# Patient Record
Sex: Female | Born: 1965 | Race: Black or African American | Hispanic: No | Marital: Single | Smoking: Never smoker
Health system: Southern US, Community
[De-identification: ages and names within clinical notes are randomized; demographics above are authoritative.]

## PROBLEM LIST (undated history)

## (undated) DIAGNOSIS — N200 Calculus of kidney: Secondary | ICD-10-CM

## (undated) DIAGNOSIS — K5900 Constipation, unspecified: Secondary | ICD-10-CM

## (undated) DIAGNOSIS — Z9889 Other specified postprocedural states: Secondary | ICD-10-CM

## (undated) DIAGNOSIS — F419 Anxiety disorder, unspecified: Secondary | ICD-10-CM

## (undated) DIAGNOSIS — K219 Gastro-esophageal reflux disease without esophagitis: Secondary | ICD-10-CM

## (undated) DIAGNOSIS — C801 Malignant (primary) neoplasm, unspecified: Secondary | ICD-10-CM

## (undated) DIAGNOSIS — Z87442 Personal history of urinary calculi: Secondary | ICD-10-CM

## (undated) DIAGNOSIS — R112 Nausea with vomiting, unspecified: Secondary | ICD-10-CM

## (undated) DIAGNOSIS — Z5189 Encounter for other specified aftercare: Secondary | ICD-10-CM

## (undated) DIAGNOSIS — S82832A Other fracture of upper and lower end of left fibula, initial encounter for closed fracture: Secondary | ICD-10-CM

## (undated) HISTORY — PX: COLON SURGERY: SHX602

## (undated) HISTORY — DX: Calculus of kidney: N20.0

## (undated) HISTORY — PX: OTHER SURGICAL HISTORY: SHX169

## (undated) HISTORY — DX: Other fracture of upper and lower end of left fibula, initial encounter for closed fracture: S82.832A

---

## 1993-08-15 DIAGNOSIS — IMO0001 Reserved for inherently not codable concepts without codable children: Secondary | ICD-10-CM

## 1993-08-15 DIAGNOSIS — Z5189 Encounter for other specified aftercare: Secondary | ICD-10-CM

## 1993-08-15 HISTORY — DX: Reserved for inherently not codable concepts without codable children: IMO0001

## 1993-08-15 HISTORY — PX: ABDOMINAL HYSTERECTOMY: SHX81

## 1993-08-15 HISTORY — DX: Encounter for other specified aftercare: Z51.89

## 1999-09-28 ENCOUNTER — Other Ambulatory Visit: Admission: RE | Admit: 1999-09-28 | Discharge: 1999-09-28 | Payer: Self-pay

## 2000-02-09 ENCOUNTER — Encounter: Admission: RE | Admit: 2000-02-09 | Discharge: 2000-05-09 | Payer: Self-pay | Admitting: Orthopedic Surgery

## 2000-11-04 ENCOUNTER — Ambulatory Visit (HOSPITAL_COMMUNITY): Admission: RE | Admit: 2000-11-04 | Discharge: 2000-11-04 | Payer: Self-pay | Admitting: Family Medicine

## 2001-10-16 ENCOUNTER — Other Ambulatory Visit: Admission: RE | Admit: 2001-10-16 | Discharge: 2001-10-16 | Payer: Self-pay | Admitting: Family Medicine

## 2002-10-27 ENCOUNTER — Emergency Department (HOSPITAL_COMMUNITY): Admission: EM | Admit: 2002-10-27 | Discharge: 2002-10-27 | Payer: Self-pay | Admitting: Emergency Medicine

## 2003-02-11 ENCOUNTER — Encounter: Admission: RE | Admit: 2003-02-11 | Discharge: 2003-04-09 | Payer: Self-pay | Admitting: Podiatry

## 2004-04-30 ENCOUNTER — Other Ambulatory Visit: Admission: RE | Admit: 2004-04-30 | Discharge: 2004-04-30 | Payer: Self-pay | Admitting: Family Medicine

## 2005-06-14 ENCOUNTER — Other Ambulatory Visit: Admission: RE | Admit: 2005-06-14 | Discharge: 2005-06-14 | Payer: Self-pay | Admitting: Family Medicine

## 2006-07-28 ENCOUNTER — Other Ambulatory Visit: Admission: RE | Admit: 2006-07-28 | Discharge: 2006-07-28 | Payer: Self-pay | Admitting: Family Medicine

## 2007-05-22 ENCOUNTER — Emergency Department (HOSPITAL_COMMUNITY): Admission: EM | Admit: 2007-05-22 | Discharge: 2007-05-22 | Payer: Self-pay | Admitting: Emergency Medicine

## 2007-11-22 ENCOUNTER — Ambulatory Visit (HOSPITAL_COMMUNITY): Admission: RE | Admit: 2007-11-22 | Discharge: 2007-11-22 | Payer: Self-pay | Admitting: Obstetrics and Gynecology

## 2007-12-12 ENCOUNTER — Ambulatory Visit (HOSPITAL_COMMUNITY): Admission: RE | Admit: 2007-12-12 | Discharge: 2007-12-12 | Payer: Self-pay | Admitting: Obstetrics & Gynecology

## 2007-12-12 ENCOUNTER — Encounter: Payer: Self-pay | Admitting: Obstetrics & Gynecology

## 2010-12-12 ENCOUNTER — Other Ambulatory Visit: Payer: Self-pay | Admitting: Orthopedic Surgery

## 2010-12-12 DIAGNOSIS — M5126 Other intervertebral disc displacement, lumbar region: Secondary | ICD-10-CM

## 2010-12-15 ENCOUNTER — Ambulatory Visit
Admission: RE | Admit: 2010-12-15 | Discharge: 2010-12-15 | Disposition: A | Discharge status: Home / Self Care | Payer: BC Managed Care – PPO | Source: Ambulatory Visit | Attending: Orthopedic Surgery | Admitting: Orthopedic Surgery

## 2010-12-15 DIAGNOSIS — M5126 Other intervertebral disc displacement, lumbar region: Secondary | ICD-10-CM

## 2010-12-28 NOTE — Op Note (Signed)
NAME:  [name], [name]               ACCOUNT NO.:  [id number]   MEDICAL RECORD NO.:  [id number]          PATIENT TYPE:  AMB   LOCATION:  DAY                           FACILITY:  [facility]   PHYSICIAN:  Lazaro Arms, M.D.   DATE OF BIRTH:  [birth year]   DATE OF PROCEDURE:  DATE OF DISCHARGE:                               OPERATIVE REPORT   PREOPERATIVE DIAGNOSES:  1. Left lower quadrant pain.  2. Left hydrosalpinx.   POSTOPERATIVE DIAGNOSES:  1. Left lower quadrant pain.  2. Left hydrosalpinx.   PROCEDURE:  Laparoscopic left salpingo-oophorectomy.   SURGEON:  Lazaro Arms, MD   ANESTHESIA:  General endotracheal.   FINDINGS:  The patient had really no adhesive disease, whatsoever.  She  did have a rather impressive left hydrosalpinx that was actually  permeating into the peritoneum as well.  The right ovary and tube were  normal.  She had 1 flimsy adhesion over her ascending colon that was  taken down.  Liver was normal.  There were no other adhesions.  No other  problems in the peritoneal cavity.   DESCRIPTION OF OPERATION:  The patient was taken to the operating room,  placed in supine position where she underwent general endotracheal  anesthesia.  She was placed in the lithotomy position.  Foley catheter  was placed.  She was prepped and draped in the usual sterile fashion.  Incision was made in the umbilicus.  Veress needle was used,  placed in  the peritoneal cavity with one pass without difficulty.  Drop test  confirmed and the peritoneal cavity was insufflated using CO2.  A  nonbladed 11-mm trocar was then employed with a camera and placed in the  peritoneal cavity with one pass without difficulty.  An incision was  made two fingerbreadths above the pubis and also the left lower quadrant  and 5-mm trocars were placed in the peritoneal cavity without  difficulty.  Under direct visualization, the above-noted findings were  seen.  Pictures were taken.  The harmonic scalpel  was employed and the  left tube and ovary were taken down hemostatically without difficulty.  We were well away from the ureter.  Ureter was identified throughout its  course.  The flimsy adhesion on the ascending colon was also taken out  without difficulty.  The right ovary was left in place because it was  completely normal as was the tube.  The left fallopian tube and ovary  were then removed with the EndoCatch through the 11-mm port, 5-mm scope  was used for visualization without difficulty.  The umbilical fascia was  closed with 0 Vicryl suture after that point, but all the gas  was allowed to escape.  The pedicle was hemostatic.  Pelvis was  irrigated.  The skin was then closed using skin staples x3.  The patient  tolerated the procedure well.  She experienced minimal blood loss, taken  to recovery room in good stable condition.  All counts were correct x3.      Lazaro Arms, M.D.  Electronically Signed     LHE/MEDQ  D:  12/12/2007  T:  12/13/2007  Job:  [id number]

## 2011-03-31 ENCOUNTER — Ambulatory Visit (HOSPITAL_COMMUNITY)
Admission: RE | Admit: 2011-03-31 | Discharge: 2011-03-31 | Disposition: A | Discharge status: Home / Self Care | Payer: BC Managed Care – PPO | Source: Ambulatory Visit | Attending: Urology | Admitting: Urology

## 2011-03-31 ENCOUNTER — Other Ambulatory Visit (HOSPITAL_COMMUNITY): Payer: Self-pay | Admitting: Urology

## 2011-03-31 DIAGNOSIS — N201 Calculus of ureter: Secondary | ICD-10-CM

## 2011-03-31 DIAGNOSIS — M545 Low back pain, unspecified: Secondary | ICD-10-CM | POA: Insufficient documentation

## 2011-03-31 DIAGNOSIS — R109 Unspecified abdominal pain: Secondary | ICD-10-CM | POA: Insufficient documentation

## 2011-05-10 LAB — COMPREHENSIVE METABOLIC PANEL
Albumin: 3.8
Alkaline Phosphatase: 48
Calcium: 9.3
Chloride: 102
Creatinine, Ser: 0.72
GFR calc Af Amer: >60
GFR calc non Af Amer: >60
Glucose, Bld: 86
Potassium: 3.9
Sodium: 135
Total Protein: 6.7

## 2011-05-10 LAB — CBC
MCHC: 35
MCV: 90.7
RBC: 4.04

## 2011-05-10 LAB — URINALYSIS, ROUTINE W REFLEX MICROSCOPIC
Bilirubin Urine: NEGATIVE
Nitrite: NEGATIVE
Specific Gravity, Urine: <1.005 — ABNORMAL LOW
Urobilinogen, UA: 0.2
pH: 6

## 2011-05-10 LAB — URINE MICROSCOPIC-ADD ON

## 2011-08-03 ENCOUNTER — Other Ambulatory Visit: Payer: Self-pay | Admitting: Otolaryngology

## 2011-09-02 ENCOUNTER — Encounter (HOSPITAL_COMMUNITY): Payer: Self-pay | Admitting: Pharmacy Technician

## 2011-09-02 ENCOUNTER — Other Ambulatory Visit (HOSPITAL_COMMUNITY): Payer: Managed Care, Other (non HMO)

## 2011-09-12 ENCOUNTER — Encounter (HOSPITAL_COMMUNITY)
Admission: RE | Admit: 2011-09-12 | Discharge: 2011-09-12 | Disposition: A | Discharge status: Home / Self Care | Payer: Managed Care, Other (non HMO) | Source: Ambulatory Visit | Attending: Specialist | Admitting: Specialist

## 2011-09-12 ENCOUNTER — Encounter (HOSPITAL_COMMUNITY): Payer: Self-pay

## 2011-09-12 HISTORY — DX: Other specified postprocedural states: Z98.890

## 2011-09-12 HISTORY — DX: Nausea with vomiting, unspecified: R11.2

## 2011-09-12 HISTORY — DX: Encounter for other specified aftercare: Z51.89

## 2011-09-12 LAB — COMPREHENSIVE METABOLIC PANEL
ALT: 19 U/L (ref 0–35)
Alkaline Phosphatase: 53 U/L (ref 39–117)
BUN: 11 mg/dL (ref 6–23)
CO2: 27 mEq/L (ref 19–32)
Chloride: 105 mEq/L (ref 96–112)
GFR calc Af Amer: >90 mL/min (ref >90)
GFR calc non Af Amer: >90 mL/min (ref >90)
Glucose, Bld: 68 mg/dL — ABNORMAL LOW (ref 70–99)
Potassium: 3.7 mEq/L (ref 3.5–5.1)
Total Bilirubin: 0.2 mg/dL — ABNORMAL LOW (ref 0.3–1.2)

## 2011-09-12 LAB — URINALYSIS, ROUTINE W REFLEX MICROSCOPIC
Bilirubin Urine: NEGATIVE
Ketones, ur: NEGATIVE mg/dL
Leukocytes, UA: NEGATIVE
Nitrite: NEGATIVE
Protein, ur: NEGATIVE mg/dL

## 2011-09-12 LAB — URINE MICROSCOPIC-ADD ON

## 2011-09-12 LAB — CBC
HCT: 40.1 % (ref 36.0–46.0)
Hemoglobin: 13.2 g/dL (ref 12.0–15.0)
MCHC: 32.9 g/dL (ref 30.0–36.0)
RBC: 4.37 MIL/uL (ref 3.87–5.11)
WBC: 5 10*3/uL (ref 4.0–10.5)

## 2011-09-12 NOTE — Patient Instructions (Addendum)
20 [name]  09/12/2011   Your procedure is scheduled on: 09-15-2011  Report to [facility] at 0830 AM.  Call this number if you have problems the morning of surgery: [phone]   Remember:   Do not eat food or drink liquids:After Midnight.      Take these medicines the morning of surgery with A SIP OF WATER:    Do not wear jewelry, make-up.  Do not wear lotions, powders, or perfumes. You may wear deodorant.  Do not shave 48 hours prior to surgery.  Do not bring valuables to the hospital.  Contacts, dentures or bridgework may not be worn into surgery.  Leave suitcase in the car. After surgery it may be brought to your room.  For patients admitted to the hospital, checkout time is 11:00 AM the day of discharge.   Patients discharged the day of surgery will not be allowed to drive home.  Name and phone number of your driver:  Special Instructions: CHG Shower Use Special Wash: 1/2 bottle night before surgery and 1/2 bottle morning of surgery.neck down avoid private area   Please read over the following fact sheets that you were given: MRSA Information Jasmine December Milo Schreier rn [facility] pre op nurse phone number [phone]

## 2011-09-15 ENCOUNTER — Encounter (HOSPITAL_COMMUNITY)
Admission: RE | Disposition: A | Discharge status: Home / Self Care | Payer: Self-pay | Source: Ambulatory Visit | Attending: Specialist

## 2011-09-15 ENCOUNTER — Ambulatory Visit (HOSPITAL_COMMUNITY)
Admission: RE | Admit: 2011-09-15 | Discharge: 2011-09-15 | Disposition: A | Discharge status: Home / Self Care | Payer: Managed Care, Other (non HMO) | Source: Ambulatory Visit | Attending: Specialist | Admitting: Specialist

## 2011-09-15 ENCOUNTER — Encounter (HOSPITAL_COMMUNITY): Payer: Self-pay | Admitting: Anesthesiology

## 2011-09-15 ENCOUNTER — Encounter (HOSPITAL_COMMUNITY): Payer: Self-pay | Admitting: *Deleted

## 2011-09-15 ENCOUNTER — Ambulatory Visit: Admit: 2011-09-15 | Payer: Self-pay | Admitting: Specialist

## 2011-09-15 ENCOUNTER — Ambulatory Visit (HOSPITAL_COMMUNITY): Payer: Managed Care, Other (non HMO) | Admitting: Anesthesiology

## 2011-09-15 DIAGNOSIS — M6688 Spontaneous rupture of other tendons, other: Secondary | ICD-10-CM | POA: Insufficient documentation

## 2011-09-15 DIAGNOSIS — Z01812 Encounter for preprocedural laboratory examination: Secondary | ICD-10-CM | POA: Insufficient documentation

## 2011-09-15 DIAGNOSIS — M76899 Other specified enthesopathies of unspecified lower limb, excluding foot: Secondary | ICD-10-CM | POA: Insufficient documentation

## 2011-09-15 DIAGNOSIS — M7072 Other bursitis of hip, left hip: Secondary | ICD-10-CM

## 2011-09-15 DIAGNOSIS — E669 Obesity, unspecified: Secondary | ICD-10-CM | POA: Insufficient documentation

## 2011-09-15 HISTORY — PX: EXCISION/RELEASE BURSA HIP: SHX5014

## 2011-09-15 SURGERY — RELEASE, BURSA, TROCHANTERIC
Anesthesia: General | Laterality: Left

## 2011-09-15 SURGERY — RELEASE, BURSA, TROCHANTERIC
Anesthesia: General | Site: Hip | Laterality: Left | Wound class: Clean

## 2011-09-15 MED ORDER — DEXAMETHASONE SODIUM PHOSPHATE 10 MG/ML IJ SOLN
INTRAMUSCULAR | Status: DC | PRN
  Administered 2011-09-15: 10 mg via INTRAVENOUS

## 2011-09-15 MED ORDER — LACTATED RINGERS IV SOLN
INTRAVENOUS | Status: DC
  Administered 2011-09-15 (×2): via INTRAVENOUS

## 2011-09-15 MED ORDER — OXYCODONE-ACETAMINOPHEN 5-325 MG PO TABS: 1.0000 | ORAL_TABLET | ORAL | Status: AC | PRN

## 2011-09-15 MED ORDER — CEFAZOLIN SODIUM-DEXTROSE 2-3 GM-% IV SOLR
2.0000 g | INTRAVENOUS | Status: AC
  Administered 2011-09-15: 2 g via INTRAVENOUS

## 2011-09-15 MED ORDER — FENTANYL CITRATE 0.05 MG/ML IJ SOLN
INTRAMUSCULAR | Status: DC | PRN
  Administered 2011-09-15: 100 ug via INTRAVENOUS
  Administered 2011-09-15: 50 ug via INTRAVENOUS

## 2011-09-15 MED ORDER — GLYCOPYRROLATE 0.2 MG/ML IJ SOLN
INTRAMUSCULAR | Status: DC | PRN
  Administered 2011-09-15: .4 mg via INTRAVENOUS

## 2011-09-15 MED ORDER — BUPIVACAINE-EPINEPHRINE PF 0.5-1:200000 % IJ SOLN
INTRAMUSCULAR | Status: DC | PRN
  Administered 2011-09-15: 15 mL

## 2011-09-15 MED ORDER — DROPERIDOL 2.5 MG/ML IJ SOLN
INTRAMUSCULAR | Status: DC | PRN
  Administered 2011-09-15: 0.625 mg via INTRAVENOUS

## 2011-09-15 MED ORDER — ACETAMINOPHEN 10 MG/ML IV SOLN
INTRAVENOUS | Status: DC | PRN
  Administered 2011-09-15: 1000 mg via INTRAVENOUS

## 2011-09-15 MED ORDER — SODIUM CHLORIDE 0.9 % IR SOLN
Status: DC | PRN
  Administered 2011-09-15: 10:00:00

## 2011-09-15 MED ORDER — PROMETHAZINE HCL 25 MG/ML IJ SOLN: 6.2500 mg | INTRAMUSCULAR | Status: DC | PRN

## 2011-09-15 MED ORDER — MIDAZOLAM HCL 5 MG/5ML IJ SOLN
INTRAMUSCULAR | Status: DC | PRN
  Administered 2011-09-15: 2 mg via INTRAVENOUS

## 2011-09-15 MED ORDER — HYDROMORPHONE HCL PF 1 MG/ML IJ SOLN
0.2500 mg | INTRAMUSCULAR | Status: DC | PRN
  Administered 2011-09-15: 0.5 mg via INTRAVENOUS

## 2011-09-15 MED ORDER — PROPOFOL 10 MG/ML IV EMUL
INTRAVENOUS | Status: DC | PRN
  Administered 2011-09-15: 140 mg via INTRAVENOUS

## 2011-09-15 MED ORDER — NEOSTIGMINE METHYLSULFATE 1 MG/ML IJ SOLN
INTRAMUSCULAR | Status: DC | PRN
  Administered 2011-09-15: 3 mg via INTRAVENOUS

## 2011-09-15 MED ORDER — KETOROLAC TROMETHAMINE 30 MG/ML IJ SOLN
15.0000 mg | Freq: Once | INTRAMUSCULAR | Status: AC | PRN
  Administered 2011-09-15: 30 mg via INTRAVENOUS

## 2011-09-15 MED ORDER — ROCURONIUM BROMIDE 100 MG/10ML IV SOLN
INTRAVENOUS | Status: DC | PRN
  Administered 2011-09-15: 35 mg via INTRAVENOUS

## 2011-09-15 MED ORDER — ONDANSETRON HCL 4 MG/2ML IJ SOLN
INTRAMUSCULAR | Status: DC | PRN
  Administered 2011-09-15: 4 mg via INTRAVENOUS

## 2011-09-15 SURGICAL SUPPLY — 43 items
APL SKNCLS STERI-STRIP NONHPOA (GAUZE/BANDAGES/DRESSINGS) ×1
BAG SPEC THK2 15X12 ZIP CLS (MISCELLANEOUS) ×1
BAG ZIPLOCK 12X15 (MISCELLANEOUS) ×2 IMPLANT
BENZOIN TINCTURE PRP APPL 2/3 (GAUZE/BANDAGES/DRESSINGS) ×1 IMPLANT
BLADE SURG 15 STRL LF DISP TIS (BLADE) ×1 IMPLANT
BLADE SURG 15 STRL SS (BLADE) ×2
CHLORAPREP W/TINT 26ML (MISCELLANEOUS) IMPLANT
CLOSURE STERI STRIP 1/2 X4 (GAUZE/BANDAGES/DRESSINGS) ×1 IMPLANT
CLOTH BEACON ORANGE TIMEOUT ST (SAFETY) ×2 IMPLANT
DECANTER SPIKE VIAL GLASS SM (MISCELLANEOUS) ×2 IMPLANT
DRAPE ORTHO SPLIT 77X108 STRL (DRAPES) ×2
DRAPE SURG ORHT 6 SPLT 77X108 (DRAPES) ×1 IMPLANT
DRAPE U-SHAPE 47X51 STRL (DRAPES) ×2 IMPLANT
DRSG ADAPTIC 3X8 NADH LF (GAUZE/BANDAGES/DRESSINGS) ×1 IMPLANT
DRSG MEPILEX BORDER 4X8 (GAUZE/BANDAGES/DRESSINGS) ×1 IMPLANT
ELECT REM PT RETURN 9FT ADLT (ELECTROSURGICAL) ×2
ELECTRODE REM PT RTRN 9FT ADLT (ELECTROSURGICAL) ×1 IMPLANT
GLOVE BIO SURGEON STRL SZ 6.5 (GLOVE) ×2 IMPLANT
GLOVE BIOGEL PI IND STRL 6.5 (GLOVE) ×1 IMPLANT
GLOVE BIOGEL PI IND STRL 8 (GLOVE) ×1 IMPLANT
GLOVE BIOGEL PI INDICATOR 6.5 (GLOVE) ×1
GLOVE BIOGEL PI INDICATOR 8 (GLOVE) ×1
GLOVE ECLIPSE 6.5 STRL STRAW (GLOVE) ×2 IMPLANT
GLOVE SURG SS PI 8.0 STRL IVOR (GLOVE) ×4 IMPLANT
GOWN PREVENTION PLUS LG XLONG (DISPOSABLE) ×2 IMPLANT
GOWN STRL REIN XL XLG (GOWN DISPOSABLE) ×2 IMPLANT
MANIFOLD NEPTUNE II (INSTRUMENTS) ×2 IMPLANT
NEEDLE HYPO 22GX1.5 SAFETY (NEEDLE) ×1 IMPLANT
NS IRRIG 1000ML POUR BTL (IV SOLUTION) ×2 IMPLANT
PACK TOTAL JOINT (CUSTOM PROCEDURE TRAY) ×2 IMPLANT
POSITIONER SURGICAL ARM (MISCELLANEOUS) ×2 IMPLANT
STAPLER VISISTAT (STAPLE) ×2 IMPLANT
STAPLER VISISTAT 35W (STAPLE) ×2 IMPLANT
STRIP CLOSURE SKIN 1/2X4 (GAUZE/BANDAGES/DRESSINGS) ×2 IMPLANT
SUT ETHIBOND NAB CT1 #1 30IN (SUTURE) ×1 IMPLANT
SUT MNCRL AB 4-0 PS2 18 (SUTURE) ×2 IMPLANT
SUT PROLENE 3 0 PS 1 (SUTURE) ×1 IMPLANT
SUT VIC AB 1 CT1 27 (SUTURE) ×6
SUT VIC AB 1 CT1 27XBRD ANTBC (SUTURE) ×3 IMPLANT
SUT VIC AB 2-0 CT1 27 (SUTURE) ×6
SUT VIC AB 2-0 CT1 TAPERPNT 27 (SUTURE) ×3 IMPLANT
SYR CONTROL 10ML LL (SYRINGE) ×1 IMPLANT
WATER STERILE IRR 1500ML POUR (IV SOLUTION) ×2 IMPLANT

## 2011-09-15 NOTE — Anesthesia Postprocedure Evaluation (Signed)
  Anesthesia Post-op Note  Patient: [name]  Procedure(s) Performed:  EXCISION/RELEASE BURSA HIP - Excision of Trochanteric Bursitis, Tendon Release and Repair  Patient Location: PACU  Anesthesia Type: General  Level of Consciousness: awake and alert   Airway and Oxygen Therapy: Patient Spontanous Breathing  Post-op Pain: mild  Post-op Assessment: Post-op Vital signs reviewed, Patient's Cardiovascular Status Stable, Respiratory Function Stable, Patent Airway and No signs of Nausea or vomiting  Post-op Vital Signs: stable  Complications: No apparent anesthesia complications

## 2011-09-15 NOTE — Transfer of Care (Signed)
Immediate Anesthesia Transfer of Care Note  Patient: [name]  Procedure(s) Performed:  EXCISION/RELEASE BURSA HIP - Excision of Trochanteric Bursitis, Tendon Release and Repair  Patient Location: PACU  Anesthesia Type: General  Level of Consciousness: awake, sedated and patient cooperative  Airway & Oxygen Therapy: Patient Spontanous Breathing and Patient connected to face mask oxygen  Post-op Assessment: Report given to PACU RN and Post -op Vital signs reviewed and stable  Post vital signs: Reviewed and stable  Complications: No apparent anesthesia complications

## 2011-09-15 NOTE — Progress Notes (Signed)
Ambulatory to BR out in hallway with crutches tolerated activity fairly well.

## 2011-09-15 NOTE — H&P (Signed)
[name] is an 46 y.o. female.   Chief Complaint: left hip pain HPI: bursitis tendon tear left hip  Past Medical History  Diagnosis Date  . PONV (postoperative nausea and vomiting)   . Blood transfusion 1995    Past Surgical History  Procedure Date  . Abdominal hysterectomy 1995  . Left ankle surgery for fx several yrs ago  . Endoscopic left fallopian tube removed several yrs ago    No family history on file. Social History:  reports that she has never smoked. She has never used smokeless tobacco. She reports that she drinks alcohol. She reports that she does not use illicit drugs.  Allergies:  Allergies  Allergen Reactions  . Codeine Shortness Of Breath    No current facility-administered medications on file as of .   No current outpatient prescriptions on file as of .    No results found for this or any previous visit (from the past 48 hour(s)). No results found.  Review of Systems  Musculoskeletal: Positive for joint pain.       Pain left hip   All other systems reviewed and are negative.    There were no vitals taken for this visit. Physical Exam  Vitals reviewed. Constitutional: She is oriented to person, place, and time. She appears well-developed.  HENT:  Head: Normocephalic.  Eyes: Pupils are equal, round, and reactive to light.  Neck: Normal range of motion.  Cardiovascular: Normal rate.   Respiratory: Effort normal.  GI: Soft.  Musculoskeletal: She exhibits tenderness.       Pain with palpation left trochanter  Neurological: She is alert and oriented to person, place, and time. She has normal reflexes.  Skin: Skin is warm.  Psychiatric: She has a normal mood and affect.     Assessment/Plan Trochanteric bursitis tendon tear left hip. Plan remove bursa repair tendon. Risks disxussed.  [name] C 09/15/2011, 7:23 AM

## 2011-09-15 NOTE — Brief Op Note (Signed)
09/15/2011  11:11 AM  PATIENT:  [name]  46 y.o. female  PRE-OPERATIVE DIAGNOSIS:  left hip bursa  POST-OPERATIVE DIAGNOSIS:  left hip bursa  PROCEDURE:  Procedure(s): EXCISION/RELEASE BURSA HIP  SURGEON:  Surgeon(s): Javier Docker, MD  PHYSICIAN ASSISTANT:   ASSISTANTS: Strader   ANESTHESIA:   general  EBL:     BLOOD ADMINISTERED:none  DRAINS: none   LOCAL MEDICATIONS USED:  MARCAINE 20CC  SPECIMEN:  No Specimen  DISPOSITION OF SPECIMEN:  N/A  COUNTS:  YES  TOURNIQUET:  * No tourniquets in log *  DICTATION: .Other Dictation: Dictation Number [id number]  PLAN OF CARE: Discharge to home after PACU  PATIENT DISPOSITION:  PACU - hemodynamically stable.   Delay start of Pharmacological VTE agent (>24hrs) due to surgical blood loss or risk of bleeding:  {YES/NO/NOT APPLICABLE:20182

## 2011-09-15 NOTE — Anesthesia Preprocedure Evaluation (Signed)
Anesthesia Evaluation  Patient identified by MRN, date of birth, ID band Patient awake    Reviewed: Allergy & Precautions, H&P , NPO status , Patient's Chart, lab work & pertinent test results  Airway Mallampati: II TM Distance: >3 FB Neck ROM: Full    Dental No notable dental hx.    Pulmonary neg pulmonary ROS,  clear to auscultation  Pulmonary exam normal       Cardiovascular neg cardio ROS Regular Normal    Neuro/Psych Negative Neurological ROS  Negative Psych ROS   GI/Hepatic negative GI ROS, Neg liver ROS,   Endo/Other  Negative Endocrine ROS  Renal/GU negative Renal ROS  Genitourinary negative   Musculoskeletal negative musculoskeletal ROS (+)   Abdominal   Peds negative pediatric ROS (+)  Hematology negative hematology ROS (+)   Anesthesia Other Findings   Reproductive/Obstetrics negative OB ROS                          Anesthesia Physical Anesthesia Plan  ASA: I  Anesthesia Plan: General   Post-op Pain Management:    Induction: Intravenous  Airway Management Planned: Oral ETT  Additional Equipment:   Intra-op Plan:   Post-operative Plan: Extubation in OR  Informed Consent: I have reviewed the patients History and Physical, chart, labs and discussed the procedure including the risks, benefits and alternatives for the proposed anesthesia with the patient or authorized representative who has indicated his/her understanding and acceptance.   Dental advisory given  Plan Discussed with: CRNA  Anesthesia Plan Comments:         Anesthesia Quick Evaluation  

## 2011-09-16 MED FILL — Scopolamine TD Patch 72HR 1 MG/3DAYS: TRANSDERMAL | Qty: 4 | Status: AC

## 2011-09-16 NOTE — Op Note (Signed)
[name], RENFROW NO.:  [id number]  MEDICAL RECORD NO.:  [id number]  LOCATION:  [facility]                         FACILITY:  [facility]  PHYSICIAN:  Jene Every, M.D.    DATE OF BIRTH:  [birth year]  DATE OF PROCEDURE:  09/15/2011 DATE OF DISCHARGE:  09/15/2011                              OPERATIVE REPORT   PREOPERATIVE DIAGNOSES:  Trochanteric bursitis of the left hip with gluteus minimus tendon tear.  Obesity with kilogram weight that was 81.  POSTOPERATIVE DIAGNOSIS:  Trochanteric bursitis of the left hip with gluteus minimus tendon tear.  PROCEDURES PERFORMED: 1. Mini-open excision of trochanteric bursa. 2. Repair of tensor fascia lata with tenotomies of the tensor fascia     lata and fenestration of the tensor fascia lata.  ANESTHESIA:  General.  ASSISTANT:  Roma Schanz, PA.  HISTORY:  This is a 46 year old who has had sciatica and left lower extremity radicular pain, sacral ala insufficiency fracture, sacroiliitis, who had resolved radiculopathy, but persistent trochanteric pain with hypertrophic bursa, possible tearing of the medius, refractory to rest, activity modification, stretching, and corticosteroid injection.  This was indicated for partial excision, possible repair of the medius, tenotomy of the tensor fascia lata. Risks and benefits were discussed including bleeding, infection, damage to neurovascular structures, no change in symptoms, worsening symptoms, need for repeat bursectomy, DVT, PE, anesthetic complications etc.  TECHNIQUE:  With the patient in supine position, after induction of adequate general anesthesia, 2 g of Kefzol, she was placed in right lateral decubitus position.  All bony prominences were well padded.  The left hip peritrochanteric region and leg was prepped and draped in the usual sterile fashion.  With the leg abducted I performed an incision over the trochanter.  Subcutaneous tissue was dissected,  electrocautery was utilized to achieve hemostasis.  Marcaine with epinephrine was infiltrated in the subcutaneous tissue.  Fascia lata was hypertrophic. It was attenuated in the myofascial junction.  A small tearing was noted here.  I performed a midline incision over the prominence of the trochanter, approximately 3 cm in length.  Noted was a hypertrophic and exuberant bursa, which was excised and debrided.  We checked just beneath the adductor tendon in the insertion of the medius and of the piriformis and no significant tear was noted.  We internally and externally rotated  the hip, and performed a full bursectomy.  Then I repaired the tensor fascia lata with a #1 Ethibond interrupted figure-of- eight suture and  #1 Vicryl interrupted figure-of-eight suture. Following this, this was repaired in the abducted position, I performed multiple fenestrations of the fascia lata with a #15 blade approximately 0.5 cm length.  This allowed some stretching of the fascia lata, which was fairly tight revealing a small tearing of the fascia lata proximally that was repaired with #1 Vicryl interrupted figure-of-eight suture. The wound was copiously irrigated.  I then closed the subcutaneous tissue in multiple layers with 2-0 Vicryl simple sutures in the subcutaneous region, and the skin was re-approximated with 4-0 subcuticular Prolene.  The wound was reinforced with Steri-Strips.  A sterile dressing was applied.  The patient was placed supine on the hospital bed, extubated without difficulty, and  transported to Recovery in satisfactory condition.  The patient tolerated the procedure well.  No complications.  Blood loss was 10 cc.  Assistant, Roma Schanz, PA, was utilized for retraction of the ample subcutaneous adipose tissue as well as holding of the extremity internally and externally, rotating the extremity for excision of the bursa and retraction.     Jene Every,  M.D.     Cordelia Pen  D:  09/15/2011  T:  09/16/2011  Job:  [id number]

## 2011-10-03 ENCOUNTER — Ambulatory Visit: Payer: Managed Care, Other (non HMO) | Attending: Specialist | Admitting: Physical Therapy

## 2011-10-03 DIAGNOSIS — IMO0001 Reserved for inherently not codable concepts without codable children: Secondary | ICD-10-CM | POA: Insufficient documentation

## 2011-10-03 DIAGNOSIS — R262 Difficulty in walking, not elsewhere classified: Secondary | ICD-10-CM | POA: Insufficient documentation

## 2011-10-03 DIAGNOSIS — R5381 Other malaise: Secondary | ICD-10-CM | POA: Insufficient documentation

## 2011-10-03 DIAGNOSIS — M25559 Pain in unspecified hip: Secondary | ICD-10-CM | POA: Insufficient documentation

## 2011-10-04 ENCOUNTER — Encounter (HOSPITAL_COMMUNITY): Payer: Self-pay | Admitting: Specialist

## 2011-10-05 ENCOUNTER — Ambulatory Visit: Payer: Managed Care, Other (non HMO) | Admitting: Physical Therapy

## 2011-10-10 ENCOUNTER — Ambulatory Visit: Payer: Managed Care, Other (non HMO) | Admitting: Physical Therapy

## 2011-10-13 ENCOUNTER — Encounter: Payer: Managed Care, Other (non HMO) | Admitting: Physical Therapy

## 2011-10-14 ENCOUNTER — Encounter: Payer: Managed Care, Other (non HMO) | Admitting: Physical Therapy

## 2011-10-17 ENCOUNTER — Ambulatory Visit: Payer: Managed Care, Other (non HMO) | Attending: Specialist | Admitting: Physical Therapy

## 2011-10-17 DIAGNOSIS — R262 Difficulty in walking, not elsewhere classified: Secondary | ICD-10-CM | POA: Insufficient documentation

## 2011-10-17 DIAGNOSIS — IMO0001 Reserved for inherently not codable concepts without codable children: Secondary | ICD-10-CM | POA: Insufficient documentation

## 2011-10-17 DIAGNOSIS — R5381 Other malaise: Secondary | ICD-10-CM | POA: Insufficient documentation

## 2011-10-17 DIAGNOSIS — M25559 Pain in unspecified hip: Secondary | ICD-10-CM | POA: Insufficient documentation

## 2011-10-18 ENCOUNTER — Ambulatory Visit: Payer: Managed Care, Other (non HMO) | Admitting: Physical Therapy

## 2011-10-20 ENCOUNTER — Ambulatory Visit: Payer: Managed Care, Other (non HMO) | Admitting: *Deleted

## 2011-10-24 ENCOUNTER — Ambulatory Visit: Payer: Managed Care, Other (non HMO) | Admitting: Physical Therapy

## 2011-10-26 ENCOUNTER — Ambulatory Visit: Payer: Managed Care, Other (non HMO) | Admitting: Physical Therapy

## 2011-10-31 ENCOUNTER — Ambulatory Visit: Payer: Managed Care, Other (non HMO) | Admitting: Physical Therapy

## 2011-11-02 ENCOUNTER — Ambulatory Visit: Payer: Managed Care, Other (non HMO) | Admitting: Physical Therapy

## 2011-11-15 ENCOUNTER — Ambulatory Visit: Payer: Managed Care, Other (non HMO) | Attending: Specialist | Admitting: Physical Therapy

## 2011-11-15 DIAGNOSIS — R5381 Other malaise: Secondary | ICD-10-CM | POA: Insufficient documentation

## 2011-11-15 DIAGNOSIS — M25559 Pain in unspecified hip: Secondary | ICD-10-CM | POA: Insufficient documentation

## 2011-11-15 DIAGNOSIS — R262 Difficulty in walking, not elsewhere classified: Secondary | ICD-10-CM | POA: Insufficient documentation

## 2011-11-15 DIAGNOSIS — IMO0001 Reserved for inherently not codable concepts without codable children: Secondary | ICD-10-CM | POA: Insufficient documentation

## 2011-11-18 ENCOUNTER — Ambulatory Visit: Payer: Managed Care, Other (non HMO) | Admitting: Physical Therapy

## 2011-11-25 ENCOUNTER — Encounter: Payer: Managed Care, Other (non HMO) | Admitting: *Deleted

## 2012-02-23 ENCOUNTER — Encounter (HOSPITAL_COMMUNITY): Payer: Self-pay | Admitting: *Deleted

## 2012-02-23 ENCOUNTER — Emergency Department (HOSPITAL_COMMUNITY): Payer: Managed Care, Other (non HMO)

## 2012-02-23 ENCOUNTER — Emergency Department (HOSPITAL_COMMUNITY)
Admission: EM | Admit: 2012-02-23 | Discharge: 2012-02-23 | Disposition: A | Discharge status: Home / Self Care | Payer: Managed Care, Other (non HMO) | Attending: Emergency Medicine | Admitting: Emergency Medicine

## 2012-02-23 DIAGNOSIS — M25552 Pain in left hip: Secondary | ICD-10-CM

## 2012-02-23 DIAGNOSIS — M25559 Pain in unspecified hip: Secondary | ICD-10-CM | POA: Insufficient documentation

## 2012-02-23 MED ORDER — OXYCODONE-ACETAMINOPHEN 5-325 MG PO TABS: ORAL_TABLET | ORAL | Status: DC

## 2012-02-23 MED ORDER — IBUPROFEN 800 MG PO TABS
800.0000 mg | ORAL_TABLET | Freq: Once | ORAL | Status: AC
  Administered 2012-02-23: 800 mg via ORAL
  Filled 2012-02-23: qty 1

## 2012-02-23 NOTE — ED Notes (Signed)
Pain lt hip and down lt leg, Hx of back and hip problems.  Feels like her hip with "give way".

## 2012-02-23 NOTE — Discharge Instructions (Signed)
Cryotherapy Cryotherapy means treatment with cold. Ice or gel packs can be used to reduce both pain and swelling. Ice is the most helpful within the first 24 to 48 hours after an injury or flareup from overusing a muscle or joint. Sprains, strains, spasms, burning pain, shooting pain, and aches can all be eased with ice. Ice can also be used when recovering from surgery. Ice is effective, has very few side effects, and is safe for most people to use. PRECAUTIONS  Ice is not a safe treatment option for people with:  Raynaud's phenomenon. This is a condition affecting small blood vessels in the extremities. Exposure to cold may cause your problems to return.   Cold hypersensitivity. There are many forms of cold hypersensitivity, including:   Cold urticaria. Red, itchy hives appear on the skin when the tissues begin to warm after being iced.   Cold erythema. This is a red, itchy rash caused by exposure to cold.   Cold hemoglobinuria. Red blood cells break down when the tissues begin to warm after being iced. The hemoglobin that carry oxygen are passed into the urine because they cannot combine with blood proteins fast enough.   Numbness or altered sensitivity in the area being iced.  If you have any of the following conditions, do not use ice until you have discussed cryotherapy with your caregiver:  Heart conditions, such as arrhythmia, angina, or chronic heart disease.   High blood pressure.   Healing wounds or open skin in the area being iced.   Current infections.   Rheumatoid arthritis.   Poor circulation.   Diabetes.  Ice slows the blood flow in the region it is applied. This is beneficial when trying to stop inflamed tissues from spreading irritating chemicals to surrounding tissues. However, if you expose your skin to cold temperatures for too long or without the proper protection, you can damage your skin or nerves. Watch for signs of skin damage due to cold. HOME CARE  INSTRUCTIONS Follow these tips to use ice and cold packs safely.  Place a dry or damp towel between the ice and skin. A damp towel will cool the skin more quickly, so you may need to shorten the time that the ice is used.   For a more rapid response, add gentle compression to the ice.   Ice for no more than 10 to 20 minutes at a time. The bonier the area you are icing, the less time it will take to get the benefits of ice.   Check your skin after 5 minutes to make sure there are no signs of a poor response to cold or skin damage.   Rest 20 minutes or more in between uses.   Once your skin is numb, you can end your treatment. You can test numbness by very lightly touching your skin. The touch should be so light that you do not see the skin dimple from the pressure of your fingertip. When using ice, most people will feel these normal sensations in this order: cold, burning, aching, and numbness.   Do not use ice on someone who cannot communicate their responses to pain, such as small children or people with dementia.  HOW TO MAKE AN ICE PACK Ice packs are the most common way to use ice therapy. Other methods include ice massage, ice baths, and cryo-sprays. Muscle creams that cause a cold, tingly feeling do not offer the same benefits that ice offers and should not be used as a substitute  unless recommended by your caregiver. To make an ice pack, do one of the following:  Place crushed ice or a bag of frozen vegetables in a sealable plastic bag. Squeeze out the excess air. Place this bag inside another plastic bag. Slide the bag into a pillowcase or place a damp towel between your skin and the bag.   Mix 3 parts water with 1 part rubbing alcohol. Freeze the mixture in a sealable plastic bag. When you remove the mixture from the freezer, it will be slushy. Squeeze out the excess air. Place this bag inside another plastic bag. Slide the bag into a pillowcase or place a damp towel between your skin  and the bag.  SEEK MEDICAL CARE IF:  You develop white spots on your skin. This may give the skin a blotchy (mottled) appearance.   Your skin turns blue or pale.   Your skin becomes waxy or hard.   Your swelling gets worse.  MAKE SURE YOU:   Understand these instructions.   Will watch your condition.   Will get help right away if you are not doing well or get worse.  Document Released: 03/28/2011 Document Revised: 07/21/2011 Document Reviewed: 03/28/2011 [facility] Patient Information 2012 [place], [place].   Take the pain medicine as directed.  Take ibuprofen 800 mg every 8 hrs with food.  Apply ice several times daily.  Use your crutches as needed and bear weight as tolerated.  Follow up with dr. Shelle Iron ASAP

## 2012-02-23 NOTE — ED Provider Notes (Signed)
History     CSN: [id number]  Arrival date & time 02/23/12  1049   First MD Initiated Contact with Patient 02/23/12 1109      Chief Complaint  Patient presents with  . Hip Pain    (Consider location/radiation/quality/duration/timing/severity/associated sxs/prior treatment) HPI Comments: Acute onset of L hip pain this AM.  Intense worsening with weight bearing at work.  Has had L hip bursa surgically removed by dr. Shelle Iron several months ago.  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  States pain radiates to L foot with numbness.  No trauma today.  Has appt with dr. Shelle Iron in early august.  She called and asked that they move up her appt if possible.  The history is provided by the patient. No language interpreter was used.    Past Medical History  Diagnosis Date  . PONV (postoperative nausea and vomiting)   . Blood transfusion 1995    Past Surgical History  Procedure Date  . Abdominal hysterectomy 1995  . Left ankle surgery for fx several yrs ago  . Endoscopic left fallopian tube removed several yrs ago  . Excision/release bursa hip 09/15/2011    Procedure: EXCISION/RELEASE BURSA HIP;  Surgeon: Javier Docker, MD;  Location: [facility];  Service: Orthopedics;  Laterality: Left;  Excision of Trochanteric Bursitis, Tendon Release and Repair    History reviewed. No pertinent family history.  History  Substance Use Topics  . Smoking status: Never Smoker   . Smokeless tobacco: Never Used  . Alcohol Use: Yes     occasional    OB History    Grav Para Term Preterm Abortions TAB SAB Ect Mult Living                  Review of Systems  Constitutional: Negative for fever.  Musculoskeletal:       Hip   Neurological: Positive for numbness.  All other systems reviewed and are negative.    Allergies  Codeine  Home Medications   Current Outpatient Rx  Name Route Sig Dispense Refill  . ASPIRIN-ACETAMINOPHEN-CAFFEINE 250-250-65 MG PO TABS Oral Take 1 tablet by mouth every 6 (six) hours as  needed.    Marland Kitchen MELATONIN PO Oral Take 1 tablet by mouth at bedtime as needed. Sleep    . OXYCODONE-ACETAMINOPHEN 5-325 MG PO TABS  One tab po q 4-6 hrsprn pain 20 tablet 0    BP 139/93  Pulse 77  Temp 98 F (36.7 C) (Oral)  Resp 18  Ht 5' 6.5" (1.689 m)  Wt 182 lb (82.555 kg)  BMI 28.94 kg/m2  SpO2 99%  Physical Exam  Nursing note and vitals reviewed. Constitutional: She is oriented to person, place, and time. She appears well-developed and well-nourished. No distress.  HENT:  Head: Normocephalic and atraumatic.  Eyes: EOM are normal.  Neck: Normal range of motion.  Cardiovascular: Normal rate, regular rhythm and normal heart sounds.   Pulmonary/Chest: Effort normal and breath sounds normal.  Abdominal: Soft. She exhibits no distension. There is no tenderness.  Musculoskeletal: She exhibits tenderness.       Left hip: She exhibits decreased range of motion and tenderness. She exhibits no swelling, no crepitus, no deformity and no laceration.       Legs: Neurological: She is alert and oriented to person, place, and time.  Skin: Skin is warm and dry.  Psychiatric: She has a normal mood and affect. Judgment normal.    ED Course  Procedures (including critical care time)  Labs Reviewed -  No data to display No results found.   1. Left hip pain       MDM  rx percocet ,20 Ibuprofen 800 mg TID Ice. F/u with dr. Shelle Iron as planned.        Evalina Field, [place] 03/06/12 [phone]

## 2012-03-07 NOTE — ED Provider Notes (Signed)
Medical screening examination/treatment/procedure(s) were performed by non-physician practitioner and as supervising physician I was immediately available for consultation/collaboration.  Margarite Vessel R. Emil Weigold, MD 03/07/12 2258 

## 2012-05-21 ENCOUNTER — Encounter: Payer: Self-pay | Admitting: Gastroenterology

## 2012-05-25 ENCOUNTER — Encounter: Payer: Self-pay | Admitting: Urgent Care

## 2012-05-25 ENCOUNTER — Ambulatory Visit (INDEPENDENT_AMBULATORY_CARE_PROVIDER_SITE_OTHER): Payer: Managed Care, Other (non HMO) | Admitting: Urgent Care

## 2012-05-25 VITALS — BP 124/84 | HR 75 | Temp 98.4°F | Ht 66.0 in | Wt 184.0 lb

## 2012-05-25 DIAGNOSIS — K921 Melena: Secondary | ICD-10-CM | POA: Insufficient documentation

## 2012-05-25 DIAGNOSIS — K5900 Constipation, unspecified: Secondary | ICD-10-CM | POA: Insufficient documentation

## 2012-05-25 MED ORDER — POLYETHYLENE GLYCOL 3350 17 GM/SCOOP PO POWD: 17.0000 g | Freq: Every day | ORAL | Status: DC | PRN

## 2012-05-25 MED ORDER — PEG-KCL-NACL-NASULF-NA ASC-C 100 G PO SOLR: 1.0000 | ORAL | Status: DC

## 2012-05-25 MED ORDER — HYDROCORTISONE ACETATE 25 MG RE SUPP: 25.0000 mg | Freq: Two times a day (BID) | RECTAL | Status: DC

## 2012-05-25 NOTE — Progress Notes (Signed)
Referring Provider: Ernestina Penna, MD Primary Care Physician:  Rudi Heap, MD Primary Gastroenterologist:  Dr. Jonette Eva  Chief Complaint  Patient presents with  . Rectal Bleeding    BRBPR  . Constipation    HPI:  [name] is a 46 y.o. female here as a referral from Dr. Christell Constant for hematochezia.  She was seen by Paulita Cradle, NP for rectal bleeding & referred for colonoscopy.  She has been seeing intermittent blood in her stools for the past 7 months.  Hx hip surgery earlier this year & has had constipation since that time.  Denies significant straining.  She assumed it was hemorrhoids.  She has been using OTC meds for constipation including Phillips laxative & colace stool softeners.  She stopped narcotics as she felt they may have contributed to her constipation.  C/o bright red to burgundy blood mixed in her stools in small to large amounts.  +clots.  Denies abdominal pain.  Weight stable.  C/o heartburn & indigestion intermittently for years but rare & only with certain foods.  Worse at night.  Denies nausea, vomiting, dysphagia, odynophagia or anorexia.  Denies early satiety.  Denies fever or chills.  She reports hgb normal at PCP.  We are awaiting their records at this time.  Past Medical History  Diagnosis Date  . PONV (postoperative nausea and vomiting)   . Blood transfusion 1995  . Kidney stone     Past Surgical History  Procedure Date  . Abdominal hysterectomy 1995    partial  . Left ankle surgery for fx several yrs ago  . Endoscopic left fallopian tube removed several yrs ago  . Excision/release bursa hip 09/15/2011    Dr Shelle Iron EXCISION/RELEASE BURSA HIP;  Surgeon: Javier Docker, MD;  Location: [facility];  Service: Orthopedics;  Laterality: Left;  Excision of Trochanteric Bursitis    Current Outpatient Prescriptions  Medication Sig Dispense Refill  . docusate sodium (COLACE) 100 MG capsule Take 100 mg by mouth 2 (two) times daily.      Marland Kitchen estradiol (ESTRACE) 1  MG tablet Take 1 mg by mouth 2 (two) times daily.      . magnesium hydroxide (MILK OF MAGNESIA) 400 MG/5ML suspension Take 15 mLs by mouth daily as needed.      Marland Kitchen terconazole (TERAZOL 7) 0.4 % vaginal cream Place 1 application onto the skin At bedtime.      . hydrocortisone (ANUSOL-HC) 25 MG suppository Place 1 suppository (25 mg total) rectally 2 (two) times daily.  20 suppository  0  . peg 3350 powder (MOVIPREP) 100 G SOLR Take 1 kit (100 g total) by mouth as directed.  1 kit  0  . polyethylene glycol powder (MIRALAX) powder Take 17 g by mouth daily as needed (constipation).  527 g  0    Allergies as of 05/25/2012 - Review Complete 05/25/2012  Allergen Reaction Noted  . Codeine Shortness Of Breath 12/15/2010  . Ultram (tramadol) Nausea Only 05/25/2012    Family History: There is no known family history of colorectal carcinoma , liver disease, or inflammatory bowel disease.  History   Social History  . Marital Status: Single    Spouse Name: N/A    Number of Children: 2  . 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 Smoking status: Never Smoker   . Smokeless tobacco: Never Used  . Alcohol Use: Yes  occasional couple times per mo  . Drug Use: No  . Sexually Active: Yes    Birth Control/ Protection: Surgical   Other Topics Concern  . Not on file   Social History Narrative   Lives alone & 2 kids    Review of Systems: Gen: Denies any fever, chills, sweats, anorexia, fatigue, weakness, malaise, weight loss, and sleep disorder CV: Denies chest pain, angina, palpitations, syncope, orthopnea, PND, peripheral edema, and claudication. Resp: Denies dyspnea at rest, dyspnea with exercise, cough, sputum, wheezing, coughing up blood, and pleurisy. GI: Denies vomiting blood, jaundice, and fecal incontinence.   GU : Denies urinary burning, blood in urine, urinary frequency, urinary hesitancy, nocturnal  urination, and urinary incontinence. MS: Denies joint pain, limitation of movement, and swelling, stiffness, low back pain, extremity pain. Denies muscle weakness, cramps, atrophy.  Derm: Denies rash, itching, dry skin, hives, moles, warts, or unhealing ulcers.  Psych: Denies depression, anxiety, memory loss, suicidal ideation, hallucinations, paranoia, and confusion. Heme: Denies bruising or enlarged lymph nodes. Neuro:  Denies any headaches, dizziness, paresthesias. Endo:  Denies any problems with DM, thyroid, adrenal function.  Physical Exam: BP 124/84  Pulse 75  Temp 98.4 F (36.9 C) (Temporal)  Ht 5\' 6"  (1.676 m)  Wt 184 lb (83.462 kg)  BMI 29.70 kg/m2 No LMP recorded. Patient has had a hysterectomy. General:   Alert,  Well-developed, well-nourished, pleasant and cooperative in NAD.  Accompanied by her mother today. Head:  Normocephalic and atraumatic. Eyes:  Sclera clear, no icterus.   Conjunctiva pink. Ears:  Normal auditory acuity. Nose:  No deformity, discharge, or lesions. Mouth:  No deformity or lesions,oropharynx pink & moist. Neck:  Supple; no masses or thyromegaly. Lungs:  Clear throughout to auscultation.   No wheezes, crackles, or rhonchi. No acute distress. Heart:  Regular rate and rhythm; no murmurs, clicks, rubs,  or gallops. Abdomen:  Normal bowel sounds.  No bruits.  Soft, non-tender and non-distended without masses, hepatosplenomegaly or hernias noted.  No guarding or rebound tenderness.   Rectal:  Deferred until colonoscopy. Msk:  Symmetrical without gross deformities. Normal posture. Pulses:  Normal pulses noted. Extremities:  No clubbing or edema. Neurologic:  Alert and oriented x4;  grossly normal neurologically. Skin:  Intact without significant lesions or rashes. Lymph Nodes:  No significant cervical adenopathy. Psych:  Alert and cooperative. Normal mood and affect.

## 2012-05-25 NOTE — Assessment & Plan Note (Addendum)
[name] is a pleasant 46 y.o. 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  Underlying constipation.  Differentials include diverticular bleeding, benign anorectal source, colorectal polyp or carcinoma.  Colonoscopy with Dr Darrick Penna.  I have discussed risks & benefits which include, but are not limited to, bleeding, infection, perforation & drug reaction.  The patient agrees with this plan & written consent will be obtained.    Rectal bleeding literature Anusol HC supp 1 PR BID

## 2012-05-25 NOTE — Assessment & Plan Note (Signed)
May use Miralax 17 grams daily for constipation Continue colace 100mg  twice daily

## 2012-05-25 NOTE — Patient Instructions (Addendum)
Colonoscopy with Dr Darrick Penna May use Miralax 17 grams daily for constipation Continue colace 100mg  twice daily Anusol HC suppositories twice daily Rectal Bleeding Rectal bleeding is when blood passes out of the anus. It is usually a sign that something is wrong. It may not be serious, but it should always be evaluated. Rectal bleeding may present as bright red blood or extremely dark stools. The color may range from dark red or maroon to black (like tar). It is important that the cause of rectal bleeding be identified so treatment can be started and the problem corrected. CAUSES   Hemorrhoids. These are enlarged (dilated) blood vessels or veins in the anal or rectal area.  Fistulas. Theseare abnormal, burrowing channels that usually run from inside the rectum to the skin around the anus. They can bleed.  Anal fissures. This is a tear in the tissue of the anus. Bleeding occurs with bowel movements.  Diverticulosis. This is a condition in which pockets or sacs project from the bowel wall. Occasionally, the sacs can bleed.  Diverticulitis. Thisis an infection involving diverticulosis of the colon.  Proctitis and colitis. These are conditions in which the rectum, colon, or both, can become inflamed and pitted (ulcerated).  Polyps and cancer. Polyps are non-cancerous (benign) growths in the colon that may bleed. Certain types of polyps turn into cancer.  Protrusion of the rectum. Part of the rectum can project from the anus and bleed.  Certain medicines.  Intestinal infections.  Blood vessel abnormalities. HOME CARE INSTRUCTIONS  Eat a high-fiber diet to keep your stool soft.  Limit activity.  Drink enough fluids to keep your urine clear or pale yellow.  Warm baths may be useful to soothe rectal pain.  Follow up with your caregiver as directed. SEEK IMMEDIATE MEDICAL CARE IF:  You develop increased bleeding.  You have black or dark red stools.  You vomit blood or material  that looks like coffee grounds.  You have abdominal pain or tenderness.  You have a fever.  You feel weak, nauseous, or you faint.  You have severe rectal pain or you are unable to have a bowel movement. MAKE SURE YOU:  Understand these instructions.  Will watch your condition.  Will get help right away if you are not doing well or get worse. Document Released: 01/21/2002 Document Revised: 10/24/2011 Document Reviewed: 01/16/2011 [facility] Patient Information 2013 [place], [place].

## 2012-05-25 NOTE — Progress Notes (Signed)
Faxed to PCP

## 2012-05-28 ENCOUNTER — Other Ambulatory Visit: Payer: Self-pay | Admitting: Gastroenterology

## 2012-05-29 ENCOUNTER — Ambulatory Visit: Payer: Managed Care, Other (non HMO) | Admitting: Gastroenterology

## 2012-05-29 ENCOUNTER — Encounter (HOSPITAL_COMMUNITY): Payer: Self-pay | Admitting: Pharmacy Technician

## 2012-05-29 ENCOUNTER — Other Ambulatory Visit: Payer: Self-pay | Admitting: Gastroenterology

## 2012-05-29 MED ORDER — PEG 3350-KCL-NA BICARB-NACL 420 G PO SOLR: 4000.0000 mL | ORAL | Status: DC

## 2012-05-31 MED ORDER — SODIUM CHLORIDE 0.45 % IV SOLN
INTRAVENOUS | Status: DC
  Administered 2012-06-01: 13:00:00 via INTRAVENOUS
  Administered 2012-06-01: 1000 mL via INTRAVENOUS

## 2012-06-01 ENCOUNTER — Ambulatory Visit (HOSPITAL_COMMUNITY): Payer: Managed Care, Other (non HMO)

## 2012-06-01 ENCOUNTER — Encounter (HOSPITAL_COMMUNITY)
Admission: RE | Disposition: A | Discharge status: Home / Self Care | Payer: Self-pay | Source: Ambulatory Visit | Attending: Gastroenterology

## 2012-06-01 ENCOUNTER — Ambulatory Visit (HOSPITAL_COMMUNITY)
Admission: RE | Admit: 2012-06-01 | Discharge: 2012-06-01 | Disposition: A | Discharge status: Home / Self Care | Payer: Managed Care, Other (non HMO) | Source: Ambulatory Visit | Attending: Gastroenterology | Admitting: Gastroenterology

## 2012-06-01 ENCOUNTER — Encounter (HOSPITAL_COMMUNITY): Payer: Self-pay | Admitting: *Deleted

## 2012-06-01 DIAGNOSIS — K5730 Diverticulosis of large intestine without perforation or abscess without bleeding: Secondary | ICD-10-CM

## 2012-06-01 DIAGNOSIS — K648 Other hemorrhoids: Secondary | ICD-10-CM

## 2012-06-01 DIAGNOSIS — K921 Melena: Secondary | ICD-10-CM

## 2012-06-01 DIAGNOSIS — C20 Malignant neoplasm of rectum: Secondary | ICD-10-CM

## 2012-06-01 DIAGNOSIS — K5900 Constipation, unspecified: Secondary | ICD-10-CM

## 2012-06-01 DIAGNOSIS — K625 Hemorrhage of anus and rectum: Secondary | ICD-10-CM

## 2012-06-01 HISTORY — DX: Constipation, unspecified: K59.00

## 2012-06-01 HISTORY — PX: COLONOSCOPY: SHX5424

## 2012-06-01 HISTORY — DX: Gastro-esophageal reflux disease without esophagitis: K21.9

## 2012-06-01 LAB — COMPREHENSIVE METABOLIC PANEL
ALT: 11 U/L (ref 0–35)
Calcium: 9.3 mg/dL (ref 8.4–10.5)
Creatinine, Ser: 0.66 mg/dL (ref 0.50–1.10)
GFR calc Af Amer: >90 mL/min (ref >90)
Glucose, Bld: 80 mg/dL (ref 70–99)
Sodium: 132 mEq/L — ABNORMAL LOW (ref 135–145)
Total Protein: 7.4 g/dL (ref 6.0–8.3)

## 2012-06-01 LAB — CBC WITH DIFFERENTIAL/PLATELET
Eosinophils Absolute: 0.1 10*3/uL (ref 0.0–0.7)
Eosinophils Relative: 1 % (ref 0–5)
Lymphs Abs: 2.1 10*3/uL (ref 0.7–4.0)
MCH: 31 pg (ref 26.0–34.0)
MCV: 92.5 fL (ref 78.0–100.0)
Platelets: 226 10*3/uL (ref 150–400)
RBC: 4.67 MIL/uL (ref 3.87–5.11)
RDW: 12.4 % (ref 11.5–15.5)

## 2012-06-01 SURGERY — COLONOSCOPY
Anesthesia: Moderate Sedation

## 2012-06-01 MED ORDER — MIDAZOLAM HCL 5 MG/5ML IJ SOLN
INTRAMUSCULAR | Status: DC | PRN
  Administered 2012-06-01: 2 mg via INTRAVENOUS
  Administered 2012-06-01 (×2): 1 mg via INTRAVENOUS
  Administered 2012-06-01 (×2): 2 mg via INTRAVENOUS

## 2012-06-01 MED ORDER — STERILE WATER FOR IRRIGATION IR SOLN
Status: DC | PRN
  Administered 2012-06-01: 13:00:00

## 2012-06-01 MED ORDER — IOHEXOL 300 MG/ML  SOLN
100.0000 mL | Freq: Once | INTRAMUSCULAR | Status: AC | PRN
  Administered 2012-06-01: 100 mL via INTRAVENOUS

## 2012-06-01 MED ORDER — MIDAZOLAM HCL 5 MG/5ML IJ SOLN
INTRAMUSCULAR | Status: AC
  Filled 2012-06-01: qty 10

## 2012-06-01 MED ORDER — MEPERIDINE HCL 100 MG/ML IJ SOLN
INTRAMUSCULAR | Status: AC
  Filled 2012-06-01: qty 2

## 2012-06-01 MED ORDER — MEPERIDINE HCL 100 MG/ML IJ SOLN
INTRAMUSCULAR | Status: DC | PRN
  Administered 2012-06-01: 50 mg via INTRAVENOUS
  Administered 2012-06-01 (×2): 25 mg via INTRAVENOUS

## 2012-06-01 NOTE — H&P (Signed)
Primary Care Physician:  MOORE, DONALD, MD Primary Gastroenterologist:  Dr. Sophya Vanblarcom  Pre-Procedure History & Physical: HPI:  [name] is a 46 y.o. female here for  BRBPR.  Past Medical History  Diagnosis Date  . PONV (postoperative nausea and vomiting)   . Blood transfusion 1995  . Kidney stone   . GERD (gastroesophageal reflux disease)     occasionally  . Constipation     Past Surgical History  Procedure Date  . Abdominal hysterectomy 1995    partial  . Left ankle surgery for fx several yrs ago  . Endoscopic left fallopian tube removed several yrs ago  . Excision/release bursa hip 09/15/2011    Dr Beane EXCISION/RELEASE BURSA HIP;  Surgeon: Jeffrey C Beane, MD;  Location: [facility];  Service: Orthopedics;  Laterality: Left;  Excision of Trochanteric Bursitis    Prior to Admission medications   Medication Sig Start Date End Date Taking? Authorizing Provider  docusate sodium (COLACE) 100 MG capsule Take 100 mg by mouth 2 (two) times daily.   Yes Historical Provider, MD  estradiol (ESTRACE) 1 MG tablet Take 1 mg by mouth 2 (two) times daily.   Yes Historical Provider, MD  hydrocortisone (ANUSOL-HC) 25 MG suppository Place 1 suppository (25 mg total) rectally 2 (two) times daily. 05/25/12  Yes Kandice L Jones, NP  magnesium hydroxide (MILK OF MAGNESIA) 400 MG/5ML suspension Take 15 mLs by mouth daily as needed.   Yes Historical Provider, MD  terconazole (TERAZOL 7) 0.4 % vaginal cream Place 1 application onto the skin At bedtime. 05/18/12  Yes Historical Provider, MD  peg 3350 powder (MOVIPREP) 100 G SOLR Take 1 kit (100 g total) by mouth as directed. 05/25/12   Makeba Delcastillo L Chelcie Estorga, MD  polyethylene glycol powder (MIRALAX) powder Take 17 g by mouth daily as needed (constipation). 05/25/12   Kandice L Jones, NP    Allergies as of 05/25/2012 - Review Complete 05/25/2012  Allergen Reaction Noted  . Codeine Shortness Of Breath 12/15/2010  . Ultram (tramadol) Nausea Only 05/25/2012     Family History  Problem Relation Age of Onset  . Colon cancer Neg Hx     History   Social History  . Marital Status: Single    Spouse Name: N/A    Number of Children: 2  . 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 Smoking status: Never Smoker   . Smokeless tobacco: Never Used  . Alcohol Use: Yes     occasional couple times per mo  . Drug Use: No  . Sexually Active: Yes    Birth Control/ Protection: Surgical   Other Topics Concern  . Not on file   Social History Narrative   Lives alone & 2 kids    Review of Systems: See HPI, otherwise negative ROS   Physical Exam: BP 132/84  Pulse 61  Temp 97.7 F (36.5 C) (Oral)  Resp 18  Ht 5' 6" (1.676 m)  Wt 184 lb (83.462 kg)  BMI 29.70 kg/m2  SpO2 100% General:   Alert,  pleasant and cooperative in NAD Head:  Normocephalic and atraumatic. Neck:  Supple; Lungs:  Clear throughout to auscultation.    Heart:  Regular rate and rhythm. Abdomen:  Soft, nontender and nondistended. Normal bowel sounds, without guarding, and without rebound.   Neurologic:  Alert and  oriented x4;  grossly normal neurologically.  Impression/Plan:    BRBPR  PLAN:   TCS TODAY  

## 2012-06-01 NOTE — Progress Notes (Signed)
Blood drawn and sent to lab as ordered. Began drinking contrast for CT.

## 2012-06-01 NOTE — Op Note (Signed)
[facility] [address] [place], [address]   COLONOSCOPY PROCEDURE REPORT  PATIENT: [name], [name]  MR#: [id number] BIRTHDATE: [birth year] , 46  yrs. old GENDER: Female ENDOSCOPIST: Jonette Eva, MD REFERRED UX:[id number] Bevelyn Buckles, N.P.  Glenford Peers, M.D. PROCEDURE DATE:  06/01/2012 PROCEDURE:   Colonoscopy with biopsy INDICATIONS:rectal bleeding. MEDICATIONS: Demerol 100 mg IV and Versed 8 mg IV  DESCRIPTION OF PROCEDURE:    Physical exam was performed.  Informed consent was obtained from the patient after explaining the benefits, risks, and alternatives to procedure.  The patient was connected to monitor and placed in left lateral position. Continuous oxygen was provided by nasal cannula and IV medicine administered through an indwelling cannula.  UNABLE TO PALPATE MASS ON RECTAL EXAM. After administration of sedation  the patients rectum was intubated and the Pentax Colonoscope [id number] colonoscope was advanced under direct visualization to the ileum. The scope was removed slowly by carefully examining the color, texture, anatomy, and integrity mucosa on the way out.  The patient was recovered in endoscopy and discharged home in satisfactory condition.       COLON FINDINGS: The colonic mucosa appeared normal.  , A 1/3 circumferential and ulcerated bleeding malignant tumor/mass, measuring 2-3cm in width, was seen in the rectum near the first vavlve of [place],  7-10 cm above the anal verge.  Multiple biopsies were performed using cold forceps.  , Mild diverticulosis was noted in the ascending colon and proximal transverse colon.  , and Small internal hemorrhoids were found.  PREP QUALITY: good. CECAL W/D TIME: 16 minutes  COMPLICATIONS: None  ENDOSCOPIC IMPRESSION: 1.   The colonic mucosa appeared normal 2.   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   Mild diverticulosis was noted in the ascending colon and proximal  transverse colon 4.   Small internal hemorrhoids   RECOMMENDATIONS: AWAIT BIOPSIES CBC/PT/INR/CEA/CMP TODAY CXR: PA/LAT & CT A/P W/ IVC TODAY RECTAL U/S NEXT WEEK PT MAY BE A CANDIDATE FOR NEO-ADJUVANT CHEMO/XRT. DISCUSSED WITH DR. Jacalyn Lefevre. WILL AWAIT BX RESULTS.      _______________________________ Rosalie DoctorJonette Eva, MD 06/01/2012 3:04 PM     PATIENT NAME:  [name] MR#: [id number]

## 2012-06-02 LAB — CEA: CEA: 0.8 ng/mL (ref 0.0–5.0)

## 2012-06-04 ENCOUNTER — Telehealth: Payer: Self-pay | Admitting: *Deleted

## 2012-06-04 NOTE — Progress Notes (Signed)
Referral has been faxed to Dr. Mariel Sleet and they will call the patient and have her set up with date & time

## 2012-06-04 NOTE — Progress Notes (Signed)
CALLED PT TO DISCUSS RESULTS. EXPLAINED SHE HAS RECTAL CA AND MAY NEED CHEMO/XRT & THEN SURGERY. REFERRAL TO DR. Hulen Shouts MADE FOR RECTAL U/S. REFER TO DR. Mariel Sleet.

## 2012-06-04 NOTE — Telephone Encounter (Signed)
Ms Freer's mother called today. She would like to speak with Dr fields regarding her daughter.  Please follow up. Thanks.

## 2012-06-04 NOTE — Telephone Encounter (Signed)
Called the pt. She said she had already told Dr. Evelina Dun she would like for her to discuss everything with her mother that she told her. Her Mom is Ria Bush and today can be reached on her cell at [phone]. She is aware Dr. Darrick Penna is at the hospital doing procedures and it might be awhile before she calls.

## 2012-06-05 ENCOUNTER — Encounter (HOSPITAL_COMMUNITY): Payer: Self-pay | Admitting: Gastroenterology

## 2012-06-06 ENCOUNTER — Encounter (HOSPITAL_COMMUNITY): Payer: Self-pay

## 2012-06-06 ENCOUNTER — Ambulatory Visit (HOSPITAL_COMMUNITY)
Admission: RE | Admit: 2012-06-06 | Discharge: 2012-06-06 | Disposition: A | Discharge status: Home / Self Care | Payer: Managed Care, Other (non HMO) | Source: Ambulatory Visit | Attending: Gastroenterology | Admitting: Gastroenterology

## 2012-06-06 ENCOUNTER — Encounter (HOSPITAL_COMMUNITY)
Admission: RE | Disposition: A | Discharge status: Home / Self Care | Payer: Self-pay | Source: Ambulatory Visit | Attending: Gastroenterology

## 2012-06-06 DIAGNOSIS — C20 Malignant neoplasm of rectum: Secondary | ICD-10-CM | POA: Insufficient documentation

## 2012-06-06 DIAGNOSIS — K5900 Constipation, unspecified: Secondary | ICD-10-CM | POA: Insufficient documentation

## 2012-06-06 DIAGNOSIS — K219 Gastro-esophageal reflux disease without esophagitis: Secondary | ICD-10-CM | POA: Insufficient documentation

## 2012-06-06 HISTORY — PX: EUS: SHX5427

## 2012-06-06 SURGERY — ULTRASOUND, LOWER GI TRACT, ENDOSCOPIC
Anesthesia: Moderate Sedation

## 2012-06-06 MED ORDER — MIDAZOLAM HCL 10 MG/2ML IJ SOLN
INTRAMUSCULAR | Status: DC | PRN
  Administered 2012-06-06 (×6): 2 mg via INTRAVENOUS

## 2012-06-06 MED ORDER — FENTANYL CITRATE 0.05 MG/ML IJ SOLN
INTRAMUSCULAR | Status: AC
  Filled 2012-06-06: qty 4

## 2012-06-06 MED ORDER — MIDAZOLAM HCL 10 MG/2ML IJ SOLN
INTRAMUSCULAR | Status: AC
  Filled 2012-06-06: qty 4

## 2012-06-06 MED ORDER — SODIUM CHLORIDE 0.9 % IV SOLN: INTRAVENOUS | Status: DC

## 2012-06-06 MED ORDER — DIPHENHYDRAMINE HCL 50 MG/ML IJ SOLN
INTRAMUSCULAR | Status: AC
  Filled 2012-06-06: qty 1

## 2012-06-06 MED ORDER — FENTANYL CITRATE 0.05 MG/ML IJ SOLN
INTRAMUSCULAR | Status: DC | PRN
  Administered 2012-06-06 (×4): 25 ug via INTRAVENOUS

## 2012-06-06 NOTE — Interval H&P Note (Signed)
History and Physical Interval Note:  06/06/2012 12:48 PM  [name]  has presented today for surgery, with the diagnosis of .  The various methods of treatment have been discussed with the patient and family. After consideration of risks, benefits and other options for treatment, the patient has consented to  Procedure(s) (LRB) with comments: LOWER ENDOSCOPIC ULTRASOUND (EUS) (N/A) as a surgical intervention .  The patient's history has been reviewed, patient examined, no change in status, stable for surgery.  I have reviewed the patient's chart and labs.  Questions were answered to the patient's satisfaction.     [name] M  Assessment:  1.  Rectal adenocarcinoma.  CT scan did not show and adenopathy or distal metastatic foci.  Plan:  1.  Endorectal ultrasound. 2.  Risks (bleeding, infection, bowel perforation that could require surgery, sedation-related changes in cardiopulmonary systems), benefits (identification and possible treatment of source of symptoms, exclusion of certain causes of symptoms), and alternatives (watchful waiting, radiographic imaging studies, empiric medical treatment) of endorectal ultrasound  were explained to patient/mother in detail, and patient wishes to proceed.

## 2012-06-06 NOTE — Telephone Encounter (Signed)
SPOKE WITH MOTHER ANSWERED QUESTIONS. BOTH HAVE MY CELL FOR FUTURE REFERENCE.

## 2012-06-06 NOTE — Op Note (Signed)
[facility] ([place]) [address] [place], [address]   OPERATIVE PROCEDURE REPORT  PATIENT: [name], [name]  MR#: [id number] BIRTHDATE: February 06, 1966  GENDER: Female ENDOSCOPIST: Willis Modena, MD REFERRED BY:  Jonette Eva, M.D. PROCEDURE DATE:  06/06/2012 PROCEDURE:   Flexible sigmoidoscopy EUS ASA CLASS:   Class I INDICATIONS:1.  rectal cancer. MEDICATIONS: Fentanyl 100 mcg IV and Versed 12 mg IV  DESCRIPTION OF PROCEDURE:   After the risks benefits and alternatives of the procedure were thoroughly explained, informed consent was obtained.  Throughout the procedure, the patients blood pressure, pulse and oxygen saturations were monitored continuously. Under direct visualization, the     endoscope was introduced through the anus  and advanced to the sigmoid colon .  Water was used as necessary to provide an acoustic interface.  Imaging was obtained at 7.5 and . Upon completion of the imaging, water was removed and the patient was sent to the recovery room in satisfactory condition.     FINDINGS:   In proximal-to-mid rectum, a   1/3rd circumferential lesion was seen, approximately 7cm from the anal verge.  Rectum was gently instilled with water to facilitate acoustic coupling.  The lesion was well seen.  It was about 2.5 cm in maximal length and less than 1cm in maximal depth.  The lesion appeared to invade into the muscularis propria along about 1cm of its length.  There is no clear penetration of the tumor through the muscularis propria.  No neighboring adenopathy seen.  ENDOSCOPIC IMPRESSION: Rectal adenocarcinoma.  Locoregional staging via endorectal ultrasound is most compatible with T2 N0 disease.  Early T3 disease cannot be entirely excluded, but seems much less likely.  RECOMMENDATIONS: 1.  Watch for potential complications of procedure. 2.  Based on endorectal ultrasound findings, proceeding directly to surgical resection is likely next best  course of action. 3.  Will discuss case with Dr. Darrick Penna.   _______________________________ Rosalie DoctorWillis Modena, MD 06/06/2012 1:58 PM   CC:

## 2012-06-06 NOTE — H&P (View-Only) (Signed)
Primary Care Physician:  Rudi Heap, MD Primary Gastroenterologist:  Dr. Darrick Penna  Pre-Procedure History & Physical: HPI:  [name] is a 46 y.o. female here for  BRBPR.  Past Medical History  Diagnosis Date  . PONV (postoperative nausea and vomiting)   . Blood transfusion 1995  . Kidney stone   . GERD (gastroesophageal reflux disease)     occasionally  . Constipation     Past Surgical History  Procedure Date  . Abdominal hysterectomy 1995    partial  . Left ankle surgery for fx several yrs ago  . Endoscopic left fallopian tube removed several yrs ago  . Excision/release bursa hip 09/15/2011    Dr Shelle Iron EXCISION/RELEASE BURSA HIP;  Surgeon: Javier Docker, MD;  Location: [facility];  Service: Orthopedics;  Laterality: Left;  Excision of Trochanteric Bursitis    Prior to Admission medications   Medication Sig Start Date End Date Taking? Authorizing Provider  docusate sodium (COLACE) 100 MG capsule Take 100 mg by mouth 2 (two) times daily.   Yes Historical Provider, MD  estradiol (ESTRACE) 1 MG tablet Take 1 mg by mouth 2 (two) times daily.   Yes Historical Provider, MD  hydrocortisone (ANUSOL-HC) 25 MG suppository Place 1 suppository (25 mg total) rectally 2 (two) times daily. 05/25/12  Yes Joselyn Arrow, NP  magnesium hydroxide (MILK OF MAGNESIA) 400 MG/5ML suspension Take 15 mLs by mouth daily as needed.   Yes Historical Provider, MD  terconazole (TERAZOL 7) 0.4 % vaginal cream Place 1 application onto the skin At bedtime. 05/18/12  Yes Historical Provider, MD  peg 3350 powder (MOVIPREP) 100 G SOLR Take 1 kit (100 g total) by mouth as directed. 05/25/12   West Bali, MD  polyethylene glycol powder (MIRALAX) powder Take 17 g by mouth daily as needed (constipation). 05/25/12   Joselyn Arrow, NP    Allergies as of 05/25/2012 - Review Complete 05/25/2012  Allergen Reaction Noted  . Codeine Shortness Of Breath 12/15/2010  . Ultram (tramadol) Nausea Only 05/25/2012     Family History  Problem Relation Age of Onset  . Colon cancer Neg Hx     History   Social History  . Marital Status: Single    Spouse Name: N/A    Number of Children: 2  . 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 Smoking status: Never Smoker   . Smokeless tobacco: Never Used  . Alcohol Use: Yes     occasional couple times per mo  . Drug Use: No  . Sexually Active: Yes    Birth Control/ Protection: Surgical   Other Topics Concern  . Not on file   Social History Narrative   Lives alone & 2 kids    Review of Systems: See HPI, otherwise negative ROS   Physical Exam: BP 132/84  Pulse 61  Temp 97.7 F (36.5 C) (Oral)  Resp 18  Ht 5\' 6"  (1.676 m)  Wt 184 lb (83.462 kg)  BMI 29.70 kg/m2  SpO2 100% General:   Alert,  pleasant and cooperative in NAD Head:  Normocephalic and atraumatic. Neck:  Supple; Lungs:  Clear throughout to auscultation.    Heart:  Regular rate and rhythm. Abdomen:  Soft, nontender and nondistended. Normal bowel sounds, without guarding, and without rebound.   Neurologic:  Alert and  oriented x4;  grossly normal neurologically.  Impression/Plan:    BRBPR  PLAN:  TCS TODAY

## 2012-06-07 ENCOUNTER — Encounter (HOSPITAL_COMMUNITY): Payer: Self-pay | Admitting: Gastroenterology

## 2012-06-07 ENCOUNTER — Encounter (HOSPITAL_COMMUNITY): Payer: Self-pay

## 2012-06-11 ENCOUNTER — Ambulatory Visit (INDEPENDENT_AMBULATORY_CARE_PROVIDER_SITE_OTHER): Payer: Managed Care, Other (non HMO) | Admitting: General Surgery

## 2012-06-11 ENCOUNTER — Ambulatory Visit (HOSPITAL_COMMUNITY): Payer: Managed Care, Other (non HMO) | Admitting: Oncology

## 2012-06-11 ENCOUNTER — Encounter (INDEPENDENT_AMBULATORY_CARE_PROVIDER_SITE_OTHER): Payer: Self-pay | Admitting: General Surgery

## 2012-06-11 VITALS — BP 100/70 | HR 60 | Temp 97.9°F | Resp 12 | Ht 66.0 in | Wt 180.5 lb

## 2012-06-11 DIAGNOSIS — C20 Malignant neoplasm of rectum: Secondary | ICD-10-CM | POA: Insufficient documentation

## 2012-06-11 NOTE — Patient Instructions (Signed)
Cancer of the Colon, Surgical Treatment Based on the location and type of colon cancer that you have, we have determined that surgical removal of this part of your colon will be part of your treatment.  This will treat symptoms of bleeding or blockage that you may be experiencing.  I will do everything that I can to remove your entire tumor safely. To do this, some normal tissue must also be removed to give you the best chance for a cure. The following will help describe what happens when you have this surgery.  TREATMENT Surgery is the most common treatment for colorectal cancer. It is a type of local therapy. It treats the cancer in the colon or rectum and the area close to the tumor by removing the tumor and some of the healthy tissue around it. This tissue includes an apron of tissue with lymph nodes and blood vessels.  We usually use laparoscopy (smaller incisions) to help minimize the size of the larger incision on your abdomen.  However, even if we use laparoscopy primarily, we still need to make an incision large enough to bring the two ends of the colon out to reconnect them.  For tumors that are very large or very adherent to the adjacent structures, we may have to make a larger incision to do your operation safely. The surgeon checks the rest of the abdomen, the intestine and the liver to see if the cancer has spread.  There may be microscopic disease present that we are unable to detect.  We may do a liver ultrasound during surgery as part of this evaluation.    When a section of the colon or rectum is removed, we can usually reconnect the healthy parts. However, sometimes reconnection is not possible. In this case, we will make a new path for your bowel movements to leave the body. This is an opening (a stoma) in the wall of the abdomen. The upper end of the intestine is then connected to the stoma. The other end is closed. The operation to create the stoma is called a colostomy. A flat bag fits  over the stoma to collect waste, and a special adhesive holds it in place.  Most patients are able to do their normal activities with a colostomy, including travelling, exercising, and swimming.    Some colostomies are temporary. The colostomy is needed only until the colon or rectum heals from surgery. Some patients will need to get additional treatment such as chemotherapy before the ostomy can be taken down.  After this occurs, we can reconnect the parts of the intestine and closes the stoma.  Some  patients need a permanent colostomy, and some patients are too frail to undergo another surgery to take the ostomy down.    The part of colon that we plan to remove in your case is the upper rectum and part of sigmoid colon.    Whatever the operative findings, I will discuss the case with your family after we are done in the operating room.  I will talk to you in the next few days when you are more awake.  I will see you in the hospital every weekday that I am not out of town.  My partners help see patients on the weekends and if I am out of town.    IF YOU ARE TAKING ASPIRIN, PLAVIX, COUMADIN, OR OTHER BLOOD THINNERS, LET us KNOW IMMEDIATELY SO WE CAN CONTACT YOUR PRESCRIBING HEALTH CARE PROVIDER TO HOLD THE MEDICATION FOR  5-7 DAYS BEFORE SURGERY    WHAT HAPPENS AFTER SURGERY: After surgery, you will go first to the recovery room, then to your room.   You will have many tubes and lines in place which is standard for this operation.  You will have several IVs, including possibly a central line in your chest or neck.  YOU WILL NOT BE ABLE TO EAT FOR SEVERAL DAYS AFTER SURGERY.  You will have a catheter in your bladder for around 1-3 days.  On your abdomen, you will have and possibly a pain pump with numbing medicine.  You will have compression stockings on your legs to decrease the risk of blood clots.    We will address your pain in several ways.  We will use an IV pain pump called a "PCA," or Patient  Controlled Analgesia.  This allows you to press a button and immediately receive a dose of pain medication without waiting for a nurse.  We also use IV Tylenol and sometimes IV Toradol which is similar to ibuprofen.  You may also have a pump with numbing medicine delivered directly to your incision.  I use doses and medications that work for the majority of people, but you may need an adjustment to the dose or type of medicine if your pain is not adequately controlled.  Your throat may be sore, in which case you may need a throat spray or lozenges.  Some patients become disoriented with the pain medication and may need adjustments due to that.    We will ask you to get out of bed the day after surgery in order to maximize your chances of not having complications.  Your risk of pneumonia and blood clots is lower with walking and sitting in the chair.  We will also ask you to perform breathing exercises.  We will also ask to you walk in your room and in the halls for the above reasons, but also in order for you to keep up your strength.    EATING: We will usually start you on clear liquids in around 1-2 days if your bowel function seems to be returning.  We advance your diet slowly to make sure you are tolerating each step.  All patients do not have a normal appetite when they go home.  Many patients also find that their taste buds do not seem the same right after surgery, and this can continue into the time of possible post operative chemotherapy and radiation.  We may need to use different doses or type of medicine than you use at home to control high blood pressure, diabetes, or other medical problems.   SLEEPING: We do not give sleeping medications in the hospital routinely.  Many patients have difficulty sleeping due to the unfamiliar environment or the medical care that occurs at night.  Sleeping medications can interfere with the pain medications and cause significant mental status changes that are  dangerous.    OTHER TREATMENT? I will present your case when the pathology is available at our GI Multidisciplinary conference which occurs every 1-2 weeks.  Medical and Radiation Oncologists are present, as well as the pathologist and radiologist in addition to myself.  If you have a larger tumor or if you have positive lymph nodes, we may have the oncologist stop by to see you while you are in the hospital.  Most firm post op treatment plans occur, however, once you are an outpatient because we will need to see how you are recovering from  surgery.  GOING HOME! Usually you are able to go home in 4-8 days, depending on whether or not complications happen and what is going on with your overall health status.   If you have more health problems or if you have limited help at home, the therapists and nurses may recommend a temporary rehab or nursing facility to help you get back on your feet before you go home.  These decisions would be made while you are in the hospital with the assistance of a social worker or case manager.  You cannot do any heavy lifting for 6-8 weeks due to the risk of hernia.    Please bring all insurance/disability forms to our office for the staff to fill out   POSSIBLE COMPLICATIONS This is a major operation and includes complications listed below: Bleeding Infection and possible wound complications such as hernia Damage to adjacent structures Leak of surgical connections, which can lead to other surgeries and possibly an ostomy. (5-10%) Possible need for other procedures, such as abscess drains in radiology  Possible prolonged hospital stay Possible diarrhea from removal of part of the colon. Possible constipation from narcotics.    Prolonged fatigue/weakness/appetite MOST PATIENTS' ENERGY LEVEL IS NOT BACK TO NORMAL FOR AT LEAST 2-4 MONTHS.  OLDER PATIENTS MAY FEEL WEAK FOR LONGER PERIODS OF TIME.   Difficulty with eating or post operative nausea  Possible early  recurrence of cancer Possible complications of your medical problems such as heart disease or arrhythmias. Death (less than 1%)  All possible complications are not listed, just the most common.    FURTHER INFORMATION? Please ask questions if you find something that we did not discuss in the office and would like more information.  If you would like another appointment if you have many questions or if your family members would like to come as well, please contact the office.    FOLLOW-UP CARE  Follow-up care after treatment for colorectal cancer is important. Even when the cancer seems to have been completely removed or destroyed, the disease sometimes returns. Undetected cancer cells may still remain somewhere in the body after treatment. We check your recovery and for recurrence of the cancer. Recurrence means that the cancer comes back.  Checkups help make sure that changes in health are found. Checkups may include:  A physical exam (possibly including a digital rectal exam). This means your caregiver checks you to see if there are any abnormal changes they can see or feel.   Lab tests (CEA test) may be done. The "fecal occult blood test" checks for blood in the stool. The CEA (carcinoembryonic antigen) is a blood test that looks for a marker of colon cancer in the blood.   A colonoscopy is a test where your caregiver examines your colon with a flexible instrument like a thin telescope which looks at the inside of the large bowel. All patients with colon cancer are recommended to get a colonoscopy 1 year after the surgery  Other specialized x-rays, CT scans, or other tests may be performed.    Between scheduled visits you should contact your caregivers as soon as any health problems appear.

## 2012-06-12 ENCOUNTER — Telehealth: Payer: Self-pay | Admitting: Hematology and Oncology

## 2012-06-12 NOTE — Telephone Encounter (Signed)
C/D 06/12/12 for appt 06/20/12

## 2012-06-13 NOTE — Progress Notes (Signed)
Faxed Disability claim to [id number].

## 2012-06-13 NOTE — Progress Notes (Signed)
FMLA PPWK COMPLETE. PT CALLED TO PICK UP. OOW 10/17-11/17.

## 2012-06-13 NOTE — Progress Notes (Signed)
FMLA paper work faxed to [employer] employer, copy made to be scanned into epic and original was mailed to the pt.

## 2012-06-15 NOTE — Assessment & Plan Note (Addendum)
[name]. Clinical T2 N0. We'll proceed with up front surgery.  The patient is symptomatic  I will plan to do a laparoscopic low anterior resection without ileostomy as long as the anastomosis does not have any evidence of leak.  The surgery was described to the patient as well as the risks of surgery and the possible complications.  These include: Bleeding Infection and possible wound complications such as hernia Damage to adjacent structures Leak of surgical connections, which can lead to other surgeries and possibly an ostomy. (5-10%) Possible need for other procedures, such as abscess drains in radiology  Possible prolonged hospital stay Possible diarrhea from removal of part of the colon. Possible constipation from narcotics.    Prolonged fatigue/weakness/appetite  Difficulty with eating or post operative nausea  Possible early recurrence of cancer Possible complications of your medical problems such as heart disease or arrhythmias. Death (less than 1%)  The patient understands and wishes to proceed. Of note, the patient works at [employer] and does not want to undergo any treatment in [facility]. She feels that this would compromise her confidentiality and make her uncomfortable. She is going to see an oncologist at the [facility] the [facility].

## 2012-06-15 NOTE — Progress Notes (Signed)
Chief Complaint  Patient presents with  . Rectal Cancer    HISTORY: Pt is a 46 year old female with around 1-2 years of blood per rectum.  She thought it was hemorrhoids, as she had had trouble with these in the past.  She also started having more constipation.  She tried over the counter stool softeners and laxatives.  She got a little better from constipation standpoint, but continued to have blood in stool for a while.  She had surgery on her shoulder, and the constipation worsened again since she took oxycodone.  She discussed these things with her PCP and was recommended to have colonoscopy.   Colonoscopy demonstrated ulcerated bleeding mass 2-3 cm in width 7-10 cm above anal verge.  EUS was used to evaluate mass and it was T2N0.    Her paternal grandmother had breast cancer, and paternal great aunt had colon cancer, but she is not sure age of diagnosis.    Past Medical History  Diagnosis Date  . Blood transfusion 1995  . Kidney stone   . GERD (gastroesophageal reflux disease)     occasionally  . Constipation   . PONV (postoperative nausea and vomiting)     Past Surgical History  Procedure Date  . Abdominal hysterectomy 1995    partial  . Left ankle surgery for fx several yrs ago  . Endoscopic left fallopian tube removed several yrs ago  . Excision/release bursa hip 09/15/2011    Dr Shelle Iron EXCISION/RELEASE BURSA HIP;  Surgeon: Javier Docker, MD;  Location: [facility];  Service: Orthopedics;  Laterality: Left;  Excision of Trochanteric Bursitis  . Colonoscopy 06/01/2012    Procedure: COLONOSCOPY;  Surgeon: West Bali, MD;  Location: [facility];  Service: Endoscopy;  Laterality: N/A;  1:30PM  . Eus 06/06/2012    Procedure: LOWER ENDOSCOPIC ULTRASOUND (EUS);  Surgeon: Willis Modena, MD;  Location: [facility];  Service: Endoscopy;  Laterality: N/A;    Current Outpatient Prescriptions  Medication Sig Dispense Refill  . estradiol (ESTRACE) 1 MG tablet Take 1 mg by mouth 2  (two) times daily.      Marland Kitchen LORazepam (ATIVAN) 1 MG tablet Take 1 mg by mouth every 8 (eight) hours.         Allergies  Allergen Reactions  . Codeine Shortness Of Breath  . Ultram (Tramadol) Nausea Only     Family History  Problem Relation Age of Onset  . Colon cancer Neg Hx   . Other Sister      History   Social History  . Marital Status: Single    Spouse Name: N/A    Number of Children: 2  . 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 Smoking status: Never Smoker   . Smokeless tobacco: Never Used  . Alcohol Use: Yes     occasional couple times per mo  . Drug Use: No  . Sexually Active: Yes    Birth Control/ Protection: Surgical   Other Topics Concern  . None   Social History Narrative   Lives alone & 2 kids     REVIEW OF SYSTEMS - PERTINENT POSITIVES ONLY: 12 point review of systems negative other than HPI and PMH except for constipation.  EXAM: Filed Vitals:   06/11/12 1023  BP: 100/70  Pulse: 60  Temp: 97.9 F (36.6 C)  Resp: 12    Gen:  No acute distress.  Well nourished  and well groomed.   Neurological: Alert and oriented to person, place, and time. Coordination normal.  Head: Normocephalic and atraumatic.  Eyes: Conjunctivae are normal. Pupils are equal, round, and reactive to light. No scleral icterus.  Neck: Normal range of motion. Neck supple. No tracheal deviation or thyromegaly present.  Cardiovascular: Normal rate, regular rhythm, normal heart sounds and intact distal pulses.  Exam reveals no gallop and no friction rub.  No murmur heard. Respiratory: Effort normal.  No respiratory distress. No chest wall tenderness. Breath sounds normal.  No wheezes, rales or rhonchi.  GI: Soft. Bowel sounds are normal. The abdomen is soft and nontender.  There is no rebound and no guarding.  Rectal:  Mass not palpable.  Musculoskeletal: Normal range of motion. Extremities are  nontender.  Lymphadenopathy: No cervical, preauricular, postauricular or axillary adenopathy is present Skin: Skin is warm and dry. No rash noted. No diaphoresis. No erythema. No pallor. No clubbing, cyanosis, or edema.   Psychiatric: Normal mood and affect. Behavior is normal. Judgment and thought content normal.    LABORATORY RESULTS: Available labs are reviewed  HCT 43.2, Plts 226, Na 132, K 3.4, LFTs normal, CEA 0.8  RADIOLOGY RESULTS: See E-Chart or I-Site for most recent results.  Images and reports are reviewed. CT abd/pelvis IMPRESSION:  1. Nonspecific wall thickening involving the terminal ileum and  cecum. Differential includes inflammation, infection or  malignancy. Correlation with direct visualization is recommended.  2. No obstructing rectal mass identified.  3. No evidence for adenopathy within the abdomen or pelvis.  4. Nonspecific low density structure in the right hepatic lobe  measures 7 mm. Too small to characterize.     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 Clinical T2 N0. We'll proceed with up front surgery.  The patient is symptomatic  I will plan to do a laparoscopic low anterior resection without ileostomy as long as the anastomosis does not have any evidence of leak.  The surgery was described to the patient as well as the risks of surgery and the possible complications.  These include: Bleeding Infection and possible wound complications such as hernia Damage to adjacent structures Leak of surgical connections, which can lead to other surgeries and possibly an ostomy. (5-10%) Possible need for other procedures, such as abscess drains in radiology  Possible prolonged hospital stay Possible diarrhea from removal of part of the colon. Possible constipation from narcotics.    Prolonged fatigue/weakness/appetite  Difficulty with eating or post operative nausea  Possible  early recurrence of cancer Possible complications of your medical problems such as heart disease or arrhythmias. Death (less than 1%)  The patient understands and wishes to proceed. Of note, the patient works at [employer] and does not want to undergo any treatment in [facility]. She feels that this would compromise her confidentiality and make her uncomfortable. She is going to see an oncologist at the [facility] the [facility].     Maudry Diego MD Surgical Oncology, General and Endocrine Surgery [facility]      Visit Diagnoses: 1. 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

## 2012-06-19 ENCOUNTER — Ambulatory Visit (INDEPENDENT_AMBULATORY_CARE_PROVIDER_SITE_OTHER): Payer: Managed Care, Other (non HMO) | Admitting: General Surgery

## 2012-06-20 ENCOUNTER — Other Ambulatory Visit: Payer: Self-pay | Admitting: *Deleted

## 2012-06-20 ENCOUNTER — Encounter: Payer: Self-pay | Admitting: Hematology and Oncology

## 2012-06-20 ENCOUNTER — Ambulatory Visit (HOSPITAL_BASED_OUTPATIENT_CLINIC_OR_DEPARTMENT_OTHER): Payer: Managed Care, Other (non HMO) | Admitting: Hematology and Oncology

## 2012-06-20 ENCOUNTER — Ambulatory Visit (HOSPITAL_BASED_OUTPATIENT_CLINIC_OR_DEPARTMENT_OTHER): Payer: Managed Care, Other (non HMO) | Admitting: Lab

## 2012-06-20 ENCOUNTER — Telehealth: Payer: Self-pay | Admitting: Hematology and Oncology

## 2012-06-20 ENCOUNTER — Ambulatory Visit (HOSPITAL_BASED_OUTPATIENT_CLINIC_OR_DEPARTMENT_OTHER): Payer: Managed Care, Other (non HMO)

## 2012-06-20 VITALS — BP 119/68 | HR 80 | Temp 98.0°F | Resp 20 | Ht 66.0 in | Wt 180.0 lb

## 2012-06-20 DIAGNOSIS — C20 Malignant neoplasm of rectum: Secondary | ICD-10-CM

## 2012-06-20 LAB — CBC WITH DIFFERENTIAL/PLATELET
Basophils Absolute: 0 10*3/uL (ref 0.0–0.1)
EOS%: 1.5 % (ref 0.0–7.0)
Eosinophils Absolute: 0.1 10*3/uL (ref 0.0–0.5)
HCT: 41.5 % (ref 34.8–46.6)
HGB: 14 g/dL (ref 11.6–15.9)
MONO#: 0.3 10*3/uL (ref 0.1–0.9)
NEUT#: 4.7 10*3/uL (ref 1.5–6.5)
NEUT%: 64.3 % (ref 38.4–76.8)
RDW: 12.6 % (ref 11.2–14.5)
WBC: 7.3 10*3/uL (ref 3.9–10.3)
lymph#: 2.1 10*3/uL (ref 0.9–3.3)

## 2012-06-20 LAB — COMPREHENSIVE METABOLIC PANEL (CC13)
AST: 14 U/L (ref 5–34)
Albumin: 3.9 g/dL (ref 3.5–5.0)
BUN: 9 mg/dL (ref 7.0–26.0)
CO2: 29 mEq/L (ref 22–29)
Calcium: 10 mg/dL (ref 8.4–10.4)
Chloride: 105 mEq/L (ref 98–107)
Creatinine: 0.7 mg/dL (ref 0.6–1.1)
Glucose: 91 mg/dl (ref 70–99)
Potassium: 4.3 mEq/L (ref 3.5–5.1)

## 2012-06-20 LAB — CEA: CEA: 0.5 ng/mL (ref 0.0–5.0)

## 2012-06-20 NOTE — Telephone Encounter (Signed)
appts made and printed for pt pt sent back to the lab     aom °

## 2012-06-20 NOTE — Progress Notes (Signed)
Checked in new patient. No financial issues. °

## 2012-06-20 NOTE — Patient Instructions (Signed)
[name]  [id number]   [facility] - AFTER VISIT SUMMARY   **RECOMMENDATIONS MADE BY THE CONSULTANT AND ANY TEST    RESULTS WILL BE SENT TO YOUR REFERRING DOCTORS.   YOUR EXAM FINDINGS, LABS AND RESULTS WERE DISCUSSED BY YOUR MD TODAY.  YOU CAN GO TO THE [facility] WEB SITE FOR INSTRUCTIONS ON HOW TO ASSESS MY CHART FOR ADDITIONAL INFORMATION AS NEEDED.  Your Updated drug allergies are: Allergies as of 06/20/2012 - Review Complete 06/11/2012  Allergen Reaction Noted  . Codeine Shortness Of Breath 12/15/2010  . Ultram (tramadol) Nausea Only 05/25/2012    Your current list of medications are: Current Outpatient Prescriptions  Medication Sig Dispense Refill  . estradiol (ESTRACE) 1 MG tablet Take 1 mg by mouth 2 (two) times daily.      Marland Kitchen LORazepam (ATIVAN) 1 MG tablet Take 1 mg by mouth every 8 (eight) hours.         INSTRUCTIONS GIVEN AND DISCUSSED:  See attached schedule   SPECIAL INSTRUCTIONS/FOLLOW-UP:  See above.  I acknowledge that I have been informed and understand all the instructions given to me and received a copy.I know to contact the clinic, my physician, or go to the emergency Department if any problems should occur.   I do not have any more questions at this time, but understand that I may call the [facility] at [phone] during business hours should I have any further questions or need assistance in obtaining follow-up care.

## 2012-06-20 NOTE — Progress Notes (Signed)
Met with patient and introduced RN navigator role.  Resource list with phone numbers given to patient.  Patient denies needs or assist at this time.  She declines a dietician referral at present time.  She is aware of the Genetics referral per verbal order of Dr. Dalene Carrow.  She was appreciative of the information and verbalized comprehension.  This RN spoke with pathology, Javata Epps, and relayed the order from Dr. Dalene Carrow that she wants MSI and IHC testing to be performed on this patient's tumor , surgery scheduled for 07/02/12.  Will continue to follow for needs.

## 2012-06-20 NOTE — Progress Notes (Signed)
This office note has been dictated.

## 2012-06-20 NOTE — Progress Notes (Signed)
Dr.    Rudi Heap    -     Primary  @  [facility]. Dr.    Raj Janus      -       GI   In   [place]. Dr.    Marilynne Halsted       -       Surgeon.  [facility]   In    [place].  Cell    Phone     [phone]      (   OK  TO  LEAVE  MESSAGES  ON   CELL  VOICE   MAIL  ONLY  ). Mother    Ria Bush     Cell    Phone       [phone]      ;      Home        [phone].

## 2012-06-21 ENCOUNTER — Telehealth: Payer: Self-pay | Admitting: Hematology and Oncology

## 2012-06-21 NOTE — Telephone Encounter (Signed)
S/w pt re genetics appt for 12/16. Per 11/6 pof f/u TBD.

## 2012-06-21 NOTE — Progress Notes (Signed)
CC:   Ernestina Penna, M.D. Paulita Cradle, NP Jonette Eva, M.D. Almond Lint, MD [place], [place], Fax [phone]  IDENTIFYING STATEMENT:  The patient is a 46 year old woman with rectal cancer seen at the request of  Dr. Christell Constant with rectal cancer.  HISTORY OF PRESENT ILLNESS:  The patient's history began 2 years ago when she began to note intermittent rectal bleeding which she initially felt was secondary to hemorrhoids.  She would also get periodic constipation.  She denied weight loss.  As a result of ongoing symptoms, Dr. Christell Constant scheduled a GI evaluation.  On 06/01/2012 the patient received a colonoscopy that revealed an ulcerated malignant tumor measuring 2-3 cm in the rectum near the first valve of [place].  Mild diverticulitis was noted in the ascending, proximal, and transverse colon.  Internal hemorrhoids were also found.  The tumor was biopsied and positive for an adenocarcinoma.  On 06/06/2012 she received a flexible sigmoidoscopy with EUS.  The lesion was seen 7 cm from the anal verge and was seen to invade into the muscularis propria.  There was no adenopathy.  The tumor was staged as a uT2 N0.  The patient has met with Dr. Donell Beers of Surgical Oncology.  Dr. Donell Beers plans to proceed with up-front surgery which will occur later on this month.  The patient reports no current concerns or complaints.  PAST MEDICAL HISTORY: 1. History of kidney stones x2. 2. Status post partial hysterectomy with C-section. 3. GERD. 4. Left ankle surgery for fracture. 5. Excision of bursa in the hip.  ALLERGIES:  Codeine, Ultram.  MEDICATIONS:  Estradiol 1 mg b.i.d., Ativan 1 mg q.8 p.r.n.  FAMILY HISTORY:  Patient's maternal aunt had colon cancer.  Paternal grandmother had breast cancer and paternal great aunts had breast cancer.  SOCIAL HISTORY:  The patient is single with 2 children age 42.  She lives in [place].  She denies alcohol or tobacco use.  She works for a [employer].  HEALTH MAINTENANCE:  She reports that a mammogram done in March of this year was unremarkable.  REVIEW OF SYSTEMS:  Denies fever, chills, night sweats, anorexia, weight loss.  GI:  Denies nausea, vomiting, abdominal pain, diarrhea, melena, hematochezia.  GU:  Denies dysuria, hematuria, nocturia, frequency. Musculoskeletal:  Denies joint aches, muscle pains.  Skin:  No bruising or bleeding.  Neurologic:  Denies headache, vision change, extremity weakness.  Rest of review of systems negative.  PHYSICAL EXAM:  General:  The patient is a well-appearing, well- nourished woman in no current distress.  Vitals:  Pulse 80, blood pressure 119/68, temperature 98, respirations 20, weight 180 pounds. HEENT:  Head is atraumatic, normocephalic.  Sclerae anicteric.  Mouth moist.  Neck:  Supple.  Chest:  Clear to percussion and auscultation. Cardiovascular:  First and second heart sounds present.  No added sounds or murmurs.  Abdomen:  Soft, nontender.  No masses.  Bowel sounds present.  Extremities:  No calf tenderness.  Pulses present and symmetrical.  Lymph nodes:  No adenopathy.  CNS:  Nonfocal.  IMPRESSION AND PLAN:  Ms. 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  She is due to proceed with up-front surgery soon.  I told her at this time it is very hard to give a definitive oncology opinion as we are awaiting the result of pathology reports following surgery.  Those reports will then define what sort of adjuvant therapy she receives, if any.  However, we did briefly  describe treatment that may be required.  At this instance, I would like for her to complete staging.  She will get a baseline PET scan and CEA level.  In addition, because of the strong family history, I recommend that her tumor be tested for MSI and IHC.  In addition, she also will be a good candidate for genetic testing at some point in the future.  She is here with her  mother.  We spent more than half the time in discussion and coordination of care.  She asked about discontinuing estradiol at time of surgery.  I told her that I worried about blood clots, so my inclination was to probably stop estradiol perioperatively, but I asked her to please touch base to her OB/GYN.  I will be telephoning her with the results of the staging workup and she follows upon discharge.    ______________________________ Laurice Record, M.D. LIO/MEDQ  D:  06/20/2012  T:  06/21/2012  Job:  [id number]

## 2012-06-23 NOTE — Progress Notes (Signed)
REVIEWED.  CEA OCT 2013 0.8

## 2012-06-23 NOTE — Progress Notes (Signed)
REVIEWED.  OCT 2013 RECTAL CA

## 2012-06-25 ENCOUNTER — Telehealth: Payer: Self-pay | Admitting: Hematology and Oncology

## 2012-06-25 NOTE — Telephone Encounter (Signed)
Per message from [place] on 11/8 pt wants to r/s genetics appt from 12/16 to after new year. Emailed [facility] and per response from [facility] ok to move to after the new yr. lmonvm for pt (cell) but then s/w pt @ home number and gv pt new d/t  For 08/20/12 @ 3pm.

## 2012-06-26 ENCOUNTER — Encounter (HOSPITAL_COMMUNITY): Payer: Self-pay

## 2012-06-26 ENCOUNTER — Encounter (HOSPITAL_COMMUNITY)
Admission: RE | Admit: 2012-06-26 | Discharge: 2012-06-26 | Disposition: A | Discharge status: Home / Self Care | Payer: Managed Care, Other (non HMO) | Source: Ambulatory Visit | Attending: General Surgery | Admitting: General Surgery

## 2012-06-26 HISTORY — DX: Anxiety disorder, unspecified: F41.9

## 2012-06-26 HISTORY — DX: Malignant (primary) neoplasm, unspecified: C80.1

## 2012-06-26 LAB — URINALYSIS, ROUTINE W REFLEX MICROSCOPIC
Glucose, UA: NEGATIVE mg/dL
Ketones, ur: NEGATIVE mg/dL
Leukocytes, UA: NEGATIVE
pH: 7 (ref 5.0–8.0)

## 2012-06-26 LAB — SURGICAL PCR SCREEN: Staphylococcus aureus: NEGATIVE

## 2012-06-26 LAB — PROTIME-INR: INR: 0.89 (ref 0.00–1.49)

## 2012-06-26 MED ORDER — ACETAMINOPHEN 10 MG/ML IV SOLN
INTRAVENOUS | Status: AC
  Filled 2012-06-26: qty 100

## 2012-06-26 NOTE — Pre-Procedure Instructions (Signed)
PT HAS CBC, DIFF, CMET AND CEA REPORTS IN EPIC FROM 06/20/12.  PT, PTT, UA, T/S WERE DONE TODAY - PREOP-AS PER ORDERS DR. BYERLY.  PT HAS CXR REPORT IN EPIC FROM 06/01/12.  SHE DOES NOT NEED PREOP EKG PER ANESTHESIOLOGIST'S GUIDELINES.

## 2012-06-26 NOTE — Patient Instructions (Addendum)
YOUR SURGERY IS SCHEDULED AT [facility]  ON:  Monday  11/18  AT 2:00 PM  REPORT TO [facility] SHORT STAY [facility] AT:  12:00 PM      PHONE # FOR SHORT STAY IS [phone]  REMEMBER TO FOLLOW BOWEL PREP INSTRUCTIONS DAY BEFORE YOUR SURGERY--INSTRUCTIONS GIVEN BY DR. BYERLY'S OFFICE.  DO NOT EAT ANYTHING AFTER MIDNIGHT THE NIGHT BEFORE YOUR SURGERY.   NO FOOD, NO CHEWING GUM, NO MINTS, NO CANDIES, NO CHEWING TOBACCO. YOU MAY HAVE CLEAR LIQUIDS TO DRINK FROM MIDNIGHT UNTIL 8:00 AM DAY OF SURGERY -- LIKE WATER                               BUT NOTHING TO DRINK AFTER 8:00 AM.  PLEASE TAKE THE FOLLOWING MEDICATIONS THE AM OF YOUR SURGERY WITH A FEW SIPS OF WATER:  LORAZEPAM   IF YOU USE INHALERS--USE YOUR INHALERS THE AM OF YOUR SURGERY AND BRING INHALERS TO THE HOSPITAL -TAKE TO SURGERY.    IF YOU ARE DIABETIC:  DO NOT TAKE ANY DIABETIC MEDICATIONS THE AM OF YOUR SURGERY.  IF YOU TAKE INSULIN IN THE EVENINGS--PLEASE ONLY TAKE 1/2 NORMAL EVENING DOSE THE NIGHT BEFORE YOUR SURGERY.  NO INSULIN THE AM OF YOUR SURGERY.  IF YOU HAVE SLEEP APNEA AND USE CPAP OR BIPAP--PLEASE BRING THE MASK AND THE TUBING.  DO NOT BRING YOUR MACHINE.  DO NOT BRING VALUABLES, MONEY, CREDIT CARDS.  DO NOT WEAR JEWELRY, MAKE-UP, NAIL POLISH AND NO METAL PINS OR CLIPS IN YOUR HAIR. CONTACT LENS, DENTURES / PARTIALS, GLASSES SHOULD NOT BE WORN TO SURGERY AND IN MOST CASES-HEARING AIDS WILL NEED TO BE REMOVED.  BRING YOUR GLASSES CASE, ANY EQUIPMENT NEEDED FOR YOUR CONTACT LENS. FOR PATIENTS ADMITTED TO THE HOSPITAL--CHECK OUT TIME THE DAY OF DISCHARGE IS 11:00 AM.  ALL INPATIENT ROOMS ARE PRIVATE - WITH BATHROOM, TELEPHONE, TELEVISION AND WIFI INTERNET.  IF YOU ARE BEING DISCHARGED THE SAME DAY OF YOUR SURGERY--YOU CAN NOT DRIVE YOURSELF HOME--AND SHOULD NOT GO HOME ALONE BY TAXI OR BUS.  NO DRIVING OR OPERATING MACHINERY FOR 24 HOURS FOLLOWING ANESTHESIA / PAIN MEDICATIONS.  PLEASE MAKE ARRANGEMENTS FOR SOMEONE TO  BE WITH YOU AT HOME THE FIRST 24 HOURS AFTER SURGERY. RESPONSIBLE DRIVER'S NAME___________________________                                               PHONE #   _______________________                                  PLEASE READ OVER ANY  FACT SHEETS THAT YOU WERE GIVEN: MRSA INFORMATION, BLOOD TRANSFUSION INFORMATION, INCENTIVE SPIROMETER INFORMATION.

## 2012-06-27 NOTE — Progress Notes (Signed)
REVIEWED.  

## 2012-06-28 ENCOUNTER — Encounter (HOSPITAL_COMMUNITY): Payer: Managed Care, Other (non HMO)

## 2012-06-28 ENCOUNTER — Ambulatory Visit
Admission: RE | Admit: 2012-06-28 | Discharge: 2012-06-28 | Disposition: A | Discharge status: Home / Self Care | Payer: Managed Care, Other (non HMO) | Source: Ambulatory Visit | Attending: Hematology and Oncology | Admitting: Hematology and Oncology

## 2012-06-28 DIAGNOSIS — C20 Malignant neoplasm of rectum: Secondary | ICD-10-CM

## 2012-06-28 MED ORDER — IOHEXOL 300 MG/ML  SOLN
75.0000 mL | Freq: Once | INTRAMUSCULAR | Status: AC | PRN
  Administered 2012-06-28: 75 mL via INTRAVENOUS

## 2012-06-29 ENCOUNTER — Encounter: Payer: Self-pay | Admitting: Hematology and Oncology

## 2012-06-29 NOTE — Progress Notes (Signed)
06/29/2012  I have received from [employer] a formal denial of this patient's PET SCAN.  I called the nurse reviewer again in an attempt to get this approved.  It was denied and the rationale was:[employer] [facility] Imaging Guidelines might support PET SCAN in the initial staging if an abnormality on standard imaging suggests a solitary metastatic lesion that may be amenable to resection and additional lesions on PET would alter surgical decisions.  I had also faxed the abdomen and pelvis ct scan results on 06/29/2012. They had already received from me your office notes and the surgical pathology.  This same information has also been mailed to patient from [place].    [name] DOB 08/21/1965 CASE# [id number]  [phone] opt. 4  Bonita Quin [phone]

## 2012-07-02 ENCOUNTER — Encounter (HOSPITAL_COMMUNITY)
Admission: RE | Disposition: A | Discharge status: Home / Self Care | Payer: Self-pay | Source: Ambulatory Visit | Attending: General Surgery

## 2012-07-02 ENCOUNTER — Telehealth: Payer: Self-pay | Admitting: *Deleted

## 2012-07-02 ENCOUNTER — Encounter (HOSPITAL_COMMUNITY): Payer: Self-pay | Admitting: *Deleted

## 2012-07-02 ENCOUNTER — Ambulatory Visit (HOSPITAL_COMMUNITY): Payer: Managed Care, Other (non HMO) | Admitting: Anesthesiology

## 2012-07-02 ENCOUNTER — Encounter (HOSPITAL_COMMUNITY): Payer: Self-pay | Admitting: Anesthesiology

## 2012-07-02 ENCOUNTER — Inpatient Hospital Stay (HOSPITAL_COMMUNITY)
Admission: RE | Admit: 2012-07-02 | Discharge: 2012-07-05 | DRG: 334 | Disposition: A | Discharge status: Home / Self Care | Payer: Managed Care, Other (non HMO) | Source: Ambulatory Visit | Attending: General Surgery | Admitting: General Surgery

## 2012-07-02 DIAGNOSIS — K219 Gastro-esophageal reflux disease without esophagitis: Secondary | ICD-10-CM | POA: Diagnosis present

## 2012-07-02 DIAGNOSIS — K5909 Other constipation: Secondary | ICD-10-CM | POA: Diagnosis present

## 2012-07-02 DIAGNOSIS — C189 Malignant neoplasm of colon, unspecified: Secondary | ICD-10-CM

## 2012-07-02 DIAGNOSIS — Z800 Family history of malignant neoplasm of digestive organs: Secondary | ICD-10-CM

## 2012-07-02 DIAGNOSIS — K921 Melena: Secondary | ICD-10-CM

## 2012-07-02 DIAGNOSIS — Z885 Allergy status to narcotic agent status: Secondary | ICD-10-CM

## 2012-07-02 DIAGNOSIS — C20 Malignant neoplasm of rectum: Secondary | ICD-10-CM

## 2012-07-02 HISTORY — PX: LAPAROSCOPIC LOW ANTERIOR RESECTION: SHX5904

## 2012-07-02 LAB — CBC
HCT: 35.4 % — ABNORMAL LOW (ref 36.0–46.0)
MCH: 30.1 pg (ref 26.0–34.0)
MCV: 89.4 fL (ref 78.0–100.0)
RBC: 3.96 MIL/uL (ref 3.87–5.11)
WBC: 10.6 10*3/uL — ABNORMAL HIGH (ref 4.0–10.5)

## 2012-07-02 LAB — CREATININE, SERUM: GFR calc Af Amer: >90 mL/min (ref >90)

## 2012-07-02 SURGERY — RESECTION, RECTUM, LOW ANTERIOR, LAPAROSCOPIC
Anesthesia: General | Site: Abdomen | Wound class: Contaminated

## 2012-07-02 MED ORDER — NALOXONE HCL 0.4 MG/ML IJ SOLN: 0.4000 mg | INTRAMUSCULAR | Status: DC | PRN

## 2012-07-02 MED ORDER — LACTATED RINGERS IV SOLN
INTRAVENOUS | Status: DC
  Administered 2012-07-02: 1000 mL via INTRAVENOUS

## 2012-07-02 MED ORDER — DIPHENHYDRAMINE HCL 12.5 MG/5ML PO ELIX: 12.5000 mg | ORAL_SOLUTION | Freq: Four times a day (QID) | ORAL | Status: DC | PRN

## 2012-07-02 MED ORDER — PROPOFOL 10 MG/ML IV EMUL
INTRAVENOUS | Status: DC | PRN
  Administered 2012-07-02: 170 mg via INTRAVENOUS

## 2012-07-02 MED ORDER — KETOROLAC TROMETHAMINE 15 MG/ML IJ SOLN: 15.0000 mg | Freq: Four times a day (QID) | INTRAMUSCULAR | Status: DC | PRN

## 2012-07-02 MED ORDER — STERILE WATER FOR IRRIGATION IR SOLN
Status: DC | PRN
  Administered 2012-07-02: 1000 mL

## 2012-07-02 MED ORDER — MIDAZOLAM HCL 5 MG/5ML IJ SOLN
INTRAMUSCULAR | Status: DC | PRN
  Administered 2012-07-02: 1.5 mg via INTRAVENOUS

## 2012-07-02 MED ORDER — 0.9 % SODIUM CHLORIDE (POUR BTL) OPTIME
TOPICAL | Status: DC | PRN
  Administered 2012-07-02: 1000 mL

## 2012-07-02 MED ORDER — HYDROMORPHONE HCL PF 1 MG/ML IJ SOLN
0.2500 mg | INTRAMUSCULAR | Status: DC | PRN
  Administered 2012-07-02 (×2): 0.5 mg via INTRAVENOUS

## 2012-07-02 MED ORDER — MORPHINE SULFATE 10 MG/ML IJ SOLN: 1.0000 mg | INTRAMUSCULAR | Status: DC | PRN

## 2012-07-02 MED ORDER — MORPHINE SULFATE (PF) 1 MG/ML IV SOLN
INTRAVENOUS | Status: AC
  Administered 2012-07-02: 1 mg via INTRAVENOUS
  Filled 2012-07-02: qty 25

## 2012-07-02 MED ORDER — MORPHINE SULFATE (PF) 1 MG/ML IV SOLN
INTRAVENOUS | Status: DC
  Administered 2012-07-02: 6 mg via INTRAVENOUS
  Administered 2012-07-02: 1 mg via INTRAVENOUS
  Administered 2012-07-02: 7.5 mg via INTRAVENOUS
  Administered 2012-07-03: 4.5 mg via INTRAVENOUS
  Administered 2012-07-03: 7.5 mg via INTRAVENOUS
  Administered 2012-07-03: 06:00:00 via INTRAVENOUS
  Administered 2012-07-03: 3 mg via INTRAVENOUS
  Administered 2012-07-04: 1.5 mg via INTRAVENOUS
  Filled 2012-07-02: qty 25

## 2012-07-02 MED ORDER — FENTANYL CITRATE 0.05 MG/ML IJ SOLN
INTRAMUSCULAR | Status: DC | PRN
  Administered 2012-07-02 (×2): 50 ug via INTRAVENOUS
  Administered 2012-07-02: 75 ug via INTRAVENOUS
  Administered 2012-07-02: 50 ug via INTRAVENOUS
  Administered 2012-07-02: 25 ug via INTRAVENOUS
  Administered 2012-07-02: 50 ug via INTRAVENOUS
  Administered 2012-07-02 (×2): 25 ug via INTRAVENOUS

## 2012-07-02 MED ORDER — KCL IN DEXTROSE-NACL 20-5-0.45 MEQ/L-%-% IV SOLN
INTRAVENOUS | Status: DC
  Administered 2012-07-02 – 2012-07-04 (×6): via INTRAVENOUS
  Filled 2012-07-02 (×6): qty 1000

## 2012-07-02 MED ORDER — CHLORHEXIDINE GLUCONATE 0.12 % MT SOLN
15.0000 mL | Freq: Two times a day (BID) | OROMUCOSAL | Status: DC
  Administered 2012-07-03: 15 mL via OROMUCOSAL
  Filled 2012-07-02 (×5): qty 15

## 2012-07-02 MED ORDER — ONDANSETRON HCL 4 MG/2ML IJ SOLN: 4.0000 mg | Freq: Four times a day (QID) | INTRAMUSCULAR | Status: DC | PRN

## 2012-07-02 MED ORDER — SUCCINYLCHOLINE CHLORIDE 20 MG/ML IJ SOLN
INTRAMUSCULAR | Status: DC | PRN
  Administered 2012-07-02: 100 mg via INTRAVENOUS

## 2012-07-02 MED ORDER — BIOTENE DRY MOUTH MT LIQD: 15.0000 mL | Freq: Two times a day (BID) | OROMUCOSAL | Status: DC

## 2012-07-02 MED ORDER — DEXAMETHASONE SODIUM PHOSPHATE 10 MG/ML IJ SOLN
INTRAMUSCULAR | Status: DC | PRN
  Administered 2012-07-02: 10 mg via INTRAVENOUS

## 2012-07-02 MED ORDER — LIDOCAINE HCL 1 % IJ SOLN
INTRAMUSCULAR | Status: DC | PRN
  Administered 2012-07-02: 20 mL

## 2012-07-02 MED ORDER — HYDROMORPHONE HCL PF 1 MG/ML IJ SOLN
INTRAMUSCULAR | Status: AC
  Filled 2012-07-02: qty 1

## 2012-07-02 MED ORDER — BUPIVACAINE-EPINEPHRINE 0.25% -1:200000 IJ SOLN
INTRAMUSCULAR | Status: DC | PRN
  Administered 2012-07-02: 50 mL

## 2012-07-02 MED ORDER — DIPHENHYDRAMINE HCL 50 MG/ML IJ SOLN
12.5000 mg | Freq: Four times a day (QID) | INTRAMUSCULAR | Status: DC | PRN
  Administered 2012-07-03 – 2012-07-04 (×3): 12.5 mg via INTRAVENOUS
  Filled 2012-07-02 (×3): qty 1

## 2012-07-02 MED ORDER — ACETAMINOPHEN 10 MG/ML IV SOLN
INTRAVENOUS | Status: DC | PRN
  Administered 2012-07-02: 1000 mg via INTRAVENOUS

## 2012-07-02 MED ORDER — KETOROLAC TROMETHAMINE 15 MG/ML IJ SOLN
15.0000 mg | Freq: Four times a day (QID) | INTRAMUSCULAR | Status: AC
  Administered 2012-07-02 – 2012-07-04 (×7): 15 mg via INTRAVENOUS
  Filled 2012-07-02 (×7): qty 1

## 2012-07-02 MED ORDER — ALVIMOPAN 12 MG PO CAPS
12.0000 mg | ORAL_CAPSULE | Freq: Two times a day (BID) | ORAL | Status: DC
  Administered 2012-07-03 – 2012-07-05 (×5): 12 mg via ORAL
  Filled 2012-07-02 (×6): qty 1

## 2012-07-02 MED ORDER — METRONIDAZOLE IN NACL 5-0.79 MG/ML-% IV SOLN
500.0000 mg | INTRAVENOUS | Status: AC
  Administered 2012-07-02: .5 g via INTRAVENOUS
  Filled 2012-07-02: qty 100

## 2012-07-02 MED ORDER — BUPIVACAINE ON-Q PAIN PUMP (FOR ORDER SET NO CHG)
INJECTION | Status: DC
  Filled 2012-07-02: qty 1

## 2012-07-02 MED ORDER — PANTOPRAZOLE SODIUM 40 MG IV SOLR
40.0000 mg | Freq: Every day | INTRAVENOUS | Status: DC
  Administered 2012-07-02 – 2012-07-03 (×2): 40 mg via INTRAVENOUS
  Filled 2012-07-02 (×3): qty 40

## 2012-07-02 MED ORDER — LIDOCAINE HCL (CARDIAC) 20 MG/ML IV SOLN
INTRAVENOUS | Status: DC | PRN
  Administered 2012-07-02: 20 mg via INTRAVENOUS

## 2012-07-02 MED ORDER — BUPIVACAINE-EPINEPHRINE 0.25% -1:200000 IJ SOLN
INTRAMUSCULAR | Status: AC
  Filled 2012-07-02: qty 1

## 2012-07-02 MED ORDER — HYDROMORPHONE HCL PF 1 MG/ML IJ SOLN
INTRAMUSCULAR | Status: DC | PRN
  Administered 2012-07-02 (×2): 0.5 mg via INTRAVENOUS
  Administered 2012-07-02: 1 mg via INTRAVENOUS

## 2012-07-02 MED ORDER — NEOSTIGMINE METHYLSULFATE 1 MG/ML IJ SOLN
INTRAMUSCULAR | Status: DC | PRN
  Administered 2012-07-02: 5 mg via INTRAVENOUS

## 2012-07-02 MED ORDER — DEXTROSE 5 % IV SOLN
2.0000 g | Freq: Once | INTRAVENOUS | Status: AC
  Administered 2012-07-02: 2 g via INTRAVENOUS
  Filled 2012-07-02: qty 2

## 2012-07-02 MED ORDER — SODIUM CHLORIDE 0.9 % IJ SOLN: 9.0000 mL | INTRAMUSCULAR | Status: DC | PRN

## 2012-07-02 MED ORDER — KCL IN DEXTROSE-NACL 20-5-0.45 MEQ/L-%-% IV SOLN
INTRAVENOUS | Status: AC
  Filled 2012-07-02: qty 1000

## 2012-07-02 MED ORDER — METRONIDAZOLE IN NACL 5-0.79 MG/ML-% IV SOLN
500.0000 mg | Freq: Three times a day (TID) | INTRAVENOUS | Status: AC
  Administered 2012-07-02 – 2012-07-03 (×2): 500 mg via INTRAVENOUS
  Filled 2012-07-02 (×2): qty 100

## 2012-07-02 MED ORDER — PROMETHAZINE HCL 25 MG/ML IJ SOLN: 6.2500 mg | INTRAMUSCULAR | Status: DC | PRN

## 2012-07-02 MED ORDER — ONDANSETRON HCL 4 MG/2ML IJ SOLN
INTRAMUSCULAR | Status: DC | PRN
  Administered 2012-07-02 (×2): 2 mg via INTRAVENOUS

## 2012-07-02 MED ORDER — MEPERIDINE HCL 50 MG/ML IJ SOLN: 6.2500 mg | INTRAMUSCULAR | Status: DC | PRN

## 2012-07-02 MED ORDER — KETOROLAC TROMETHAMINE 15 MG/ML IJ SOLN
INTRAMUSCULAR | Status: AC
  Administered 2012-07-02: 15 mg via INTRAVENOUS
  Filled 2012-07-02: qty 1

## 2012-07-02 MED ORDER — CISATRACURIUM BESYLATE (PF) 10 MG/5ML IV SOLN
INTRAVENOUS | Status: DC | PRN
  Administered 2012-07-02 (×3): 2 mg via INTRAVENOUS
  Administered 2012-07-02 (×2): 5 mg via INTRAVENOUS

## 2012-07-02 MED ORDER — OXYCODONE-ACETAMINOPHEN 5-325 MG PO TABS: 1.0000 | ORAL_TABLET | ORAL | Status: DC | PRN

## 2012-07-02 MED ORDER — ACETAMINOPHEN 10 MG/ML IV SOLN
INTRAVENOUS | Status: AC
  Filled 2012-07-02: qty 100

## 2012-07-02 MED ORDER — SCOPOLAMINE 1 MG/3DAYS TD PT72
1.0000 | MEDICATED_PATCH | TRANSDERMAL | Status: DC
  Administered 2012-07-02: 1.5 mg via TRANSDERMAL
  Filled 2012-07-02: qty 1

## 2012-07-02 MED ORDER — ESTRADIOL 1 MG PO TABS
1.0000 mg | ORAL_TABLET | Freq: Two times a day (BID) | ORAL | Status: DC
  Administered 2012-07-03: 1 mg via ORAL
  Filled 2012-07-02 (×7): qty 1

## 2012-07-02 MED ORDER — LIDOCAINE HCL 1 % IJ SOLN
INTRAMUSCULAR | Status: AC
  Filled 2012-07-02: qty 20

## 2012-07-02 MED ORDER — CIPROFLOXACIN IN D5W 400 MG/200ML IV SOLN
400.0000 mg | Freq: Two times a day (BID) | INTRAVENOUS | Status: AC
  Administered 2012-07-02: 400 mg via INTRAVENOUS
  Filled 2012-07-02: qty 200

## 2012-07-02 MED ORDER — ENOXAPARIN SODIUM 40 MG/0.4ML ~~LOC~~ SOLN
40.0000 mg | SUBCUTANEOUS | Status: DC
  Administered 2012-07-03 – 2012-07-05 (×3): 40 mg via SUBCUTANEOUS
  Filled 2012-07-02 (×3): qty 0.4

## 2012-07-02 MED ORDER — ACETAMINOPHEN 10 MG/ML IV SOLN
1000.0000 mg | Freq: Four times a day (QID) | INTRAVENOUS | Status: AC
  Administered 2012-07-02 – 2012-07-03 (×4): 1000 mg via INTRAVENOUS
  Filled 2012-07-02 (×7): qty 100

## 2012-07-02 MED ORDER — GLYCOPYRROLATE 0.2 MG/ML IJ SOLN
INTRAMUSCULAR | Status: DC | PRN
  Administered 2012-07-02: 0.2 mg via INTRAVENOUS
  Administered 2012-07-02: .8 mg via INTRAVENOUS

## 2012-07-02 MED ORDER — ACETAMINOPHEN 10 MG/ML IV SOLN: 1000.0000 mg | Freq: Once | INTRAVENOUS | Status: DC | PRN

## 2012-07-02 MED ORDER — LACTATED RINGERS IV SOLN
INTRAVENOUS | Status: DC | PRN
  Administered 2012-07-02 (×3): via INTRAVENOUS

## 2012-07-02 MED ORDER — SCOPOLAMINE 1 MG/3DAYS TD PT72
MEDICATED_PATCH | TRANSDERMAL | Status: AC
  Filled 2012-07-02: qty 1

## 2012-07-02 MED ORDER — ONDANSETRON HCL 4 MG PO TABS: 4.0000 mg | ORAL_TABLET | Freq: Four times a day (QID) | ORAL | Status: DC | PRN

## 2012-07-02 MED ORDER — LORAZEPAM 1 MG PO TABS: 1.0000 mg | ORAL_TABLET | Freq: Three times a day (TID) | ORAL | Status: DC | PRN

## 2012-07-02 MED ORDER — BUPIVACAINE 0.25 % ON-Q PUMP DUAL CATH 300 ML
300.0000 mL | INJECTION | Status: DC
  Filled 2012-07-02: qty 300

## 2012-07-02 SURGICAL SUPPLY — 85 items
ADH SKN CLS APL DERMABOND .7 (GAUZE/BANDAGES/DRESSINGS) ×1
APPLIER CLIP 5 13 M/L LIGAMAX5 (MISCELLANEOUS)
APPLIER CLIP ROT 10 11.4 M/L (STAPLE)
APR CLP MED LRG 11.4X10 (STAPLE)
APR CLP MED LRG 5 ANG JAW (MISCELLANEOUS)
BLADE EXTENDED COATED 6.5IN (ELECTRODE) ×1 IMPLANT
BLADE HEX COATED 2.75 (ELECTRODE) ×2 IMPLANT
BLADE SURG SZ10 CARB STEEL (BLADE) ×2 IMPLANT
CABLE HIGH FREQUENCY MONO STRZ (ELECTRODE) ×2 IMPLANT
CANISTER SUCTION 2500CC (MISCELLANEOUS) ×2 IMPLANT
CANNULA ENDOPATH XCEL 11M (ENDOMECHANICALS) IMPLANT
CATH KIT ON Q 5IN SLV (PAIN MANAGEMENT) ×2 IMPLANT
CELLS DAT CNTRL 66122 CELL SVR (MISCELLANEOUS) IMPLANT
CLIP APPLIE 5 13 M/L LIGAMAX5 (MISCELLANEOUS) IMPLANT
CLIP APPLIE ROT 10 11.4 M/L (STAPLE) IMPLANT
CLOTH BEACON ORANGE TIMEOUT ST (SAFETY) ×2 IMPLANT
COVER MAYO STAND STRL (DRAPES) ×2 IMPLANT
DECANTER SPIKE VIAL GLASS SM (MISCELLANEOUS) ×2 IMPLANT
DERMABOND ADVANCED (GAUZE/BANDAGES/DRESSINGS) ×1
DERMABOND ADVANCED .7 DNX12 (GAUZE/BANDAGES/DRESSINGS) ×1 IMPLANT
DISSECTOR BLUNT TIP ENDO 5MM (MISCELLANEOUS) IMPLANT
DRAIN CHANNEL RND F F (WOUND CARE) ×1 IMPLANT
DRAPE LAPAROSCOPIC ABDOMINAL (DRAPES) ×2 IMPLANT
DRAPE LG THREE QUARTER DISP (DRAPES) ×1 IMPLANT
DRAPE UTILITY XL STRL (DRAPES) ×2 IMPLANT
DRAPE WARM FLUID 44X44 (DRAPE) ×2 IMPLANT
ELECT REM PT RETURN 9FT ADLT (ELECTROSURGICAL) ×2
ELECTRODE REM PT RTRN 9FT ADLT (ELECTROSURGICAL) ×1 IMPLANT
EVACUATOR SILICONE 100CC (DRAIN) ×1 IMPLANT
FILTER SMOKE EVAC LAPAROSHD (FILTER) IMPLANT
GLOVE BIOGEL PI IND STRL 7.0 (GLOVE) ×1 IMPLANT
GLOVE BIOGEL PI INDICATOR 7.0 (GLOVE) ×1
GLOVE ECLIPSE 8.0 STRL XLNG CF (GLOVE) ×4 IMPLANT
GLOVE INDICATOR 8.0 STRL GRN (GLOVE) ×4 IMPLANT
GOWN STRL NON-REIN LRG LVL3 (GOWN DISPOSABLE) ×2 IMPLANT
GOWN STRL REIN XL XLG (GOWN DISPOSABLE) ×4 IMPLANT
KIT BASIN OR (CUSTOM PROCEDURE TRAY) ×2 IMPLANT
LEGGING LITHOTOMY PAIR STRL (DRAPES) ×2 IMPLANT
LIGASURE IMPACT 36 18CM CVD LR (INSTRUMENTS) IMPLANT
NS IRRIG 1000ML POUR BTL (IV SOLUTION) ×4 IMPLANT
PENCIL BUTTON HOLSTER BLD 10FT (ELECTRODE) ×3 IMPLANT
RETRACTOR WND ALEXIS 18 MED (MISCELLANEOUS) IMPLANT
RTRCTR WOUND ALEXIS 18CM MED (MISCELLANEOUS)
SCALPEL HARMONIC ACE (MISCELLANEOUS) IMPLANT
SCISSORS LAP 5X35 DISP (ENDOMECHANICALS) ×2 IMPLANT
SEALER TISSUE G2 CVD JAW 35 (ENDOMECHANICALS) IMPLANT
SEALER TISSUE G2 CVD JAW 45CM (ENDOMECHANICALS) ×1
SET IRRIG TUBING LAPAROSCOPIC (IRRIGATION / IRRIGATOR) ×1 IMPLANT
SLEEVE XCEL OPT CAN 5 100 (ENDOMECHANICALS) ×2 IMPLANT
SOLUTION ANTI FOG 6CC (MISCELLANEOUS) ×2 IMPLANT
SPONGE GAUZE 4X4 12PLY (GAUZE/BANDAGES/DRESSINGS) ×2 IMPLANT
SPONGE LAP 18X18 X RAY DECT (DISPOSABLE) ×4 IMPLANT
STAPLER CIRC ILS CVD 33MM 37CM (STAPLE) ×1 IMPLANT
STAPLER CUT CVD 40MM BLUE (STAPLE) ×1 IMPLANT
STAPLER VISISTAT 35W (STAPLE) ×2 IMPLANT
STRIP CLOSURE SKIN 1/2X4 (GAUZE/BANDAGES/DRESSINGS) ×1 IMPLANT
SUCTION POOLE TIP (SUCTIONS) ×2 IMPLANT
SUT PDS AB 1 CTX 36 (SUTURE) ×3 IMPLANT
SUT PDS AB 1 TP1 96 (SUTURE) IMPLANT
SUT PROLENE 0 SH 30 (SUTURE) ×1 IMPLANT
SUT PROLENE 2 0 SH DA (SUTURE) IMPLANT
SUT SILK 2 0 (SUTURE) ×2
SUT SILK 2 0 SH CR/8 (SUTURE) ×2 IMPLANT
SUT SILK 2-0 18XBRD TIE 12 (SUTURE) ×1 IMPLANT
SUT SILK 3 0 (SUTURE) ×2
SUT SILK 3 0 SH CR/8 (SUTURE) ×2 IMPLANT
SUT SILK 3-0 18XBRD TIE 12 (SUTURE) ×1 IMPLANT
SUT VIC AB 0 CT1 27 (SUTURE) ×2
SUT VIC AB 0 CT1 27XBRD ANTBC (SUTURE) IMPLANT
SUT VIC AB 4-0 PS2 18 (SUTURE) ×2 IMPLANT
SUT VICRYL 2 0 18  UND BR (SUTURE) ×1
SUT VICRYL 2 0 18 UND BR (SUTURE) ×1 IMPLANT
SYS LAPSCP GELPORT 120MM (MISCELLANEOUS)
SYSTEM LAPSCP GELPORT 120MM (MISCELLANEOUS) IMPLANT
TOWEL OR 17X26 10 PK STRL BLUE (TOWEL DISPOSABLE) ×2 IMPLANT
TRAY FOLEY CATH 14FRSI W/METER (CATHETERS) ×2 IMPLANT
TRAY LAP CHOLE (CUSTOM PROCEDURE TRAY) ×2 IMPLANT
TROCAR BLADELESS OPT 5 100 (ENDOMECHANICALS) IMPLANT
TROCAR BLADELESS OPT 5 75 (ENDOMECHANICALS) ×4 IMPLANT
TROCAR XCEL BLUNT TIP 100MML (ENDOMECHANICALS) IMPLANT
TROCAR XCEL NON-BLD 11X100MML (ENDOMECHANICALS) IMPLANT
TUBING INSUFFLATION 10FT LAP (TUBING) ×2 IMPLANT
TUNNELER SHEATH ON-Q 16GX12 DP (PAIN MANAGEMENT) ×1 IMPLANT
YANKAUER SUCT BULB TIP 10FT TU (MISCELLANEOUS) ×2 IMPLANT
YANKAUER SUCT BULB TIP NO VENT (SUCTIONS) ×2 IMPLANT

## 2012-07-02 NOTE — Anesthesia Postprocedure Evaluation (Signed)
Anesthesia Post Note  Patient: [name]  Procedure(s) Performed: Procedure(s) (LRB): LAPAROSCOPIC LOW ANTERIOR RESECTION (N/A)  Anesthesia type: General  Patient location: PACU  Post pain: Pain level controlled  Post assessment: Post-op Vital signs reviewed  Last Vitals: BP 105/63  Pulse 64  Temp 36.8 C (Oral)  Resp 12  SpO2 100%  Post vital signs: Reviewed  Level of consciousness: sedated  Complications: No apparent anesthesia complications

## 2012-07-02 NOTE — H&P (View-Only) (Signed)
Chief Complaint  Patient presents with  . Rectal Cancer    HISTORY: Pt is a 46 year old female with around 1-2 years of blood per rectum.  She thought it was hemorrhoids, as she had had trouble with these in the past.  She also started having more constipation.  She tried over the counter stool softeners and laxatives.  She got a little better from constipation standpoint, but continued to have blood in stool for a while.  She had surgery on her shoulder, and the constipation worsened again since she took oxycodone.  She discussed these things with her PCP and was recommended to have colonoscopy.   Colonoscopy demonstrated ulcerated bleeding mass 2-3 cm in width 7-10 cm above anal verge.  EUS was used to evaluate mass and it was T2N0.    Her paternal grandmother had breast cancer, and paternal great aunt had colon cancer, but she is not sure age of diagnosis.    Past Medical History  Diagnosis Date  . Blood transfusion 1995  . Kidney stone   . GERD (gastroesophageal reflux disease)     occasionally  . Constipation   . PONV (postoperative nausea and vomiting)     Past Surgical History  Procedure Date  . Abdominal hysterectomy 1995    partial  . Left ankle surgery for fx several yrs ago  . Endoscopic left fallopian tube removed several yrs ago  . Excision/release bursa hip 09/15/2011    Dr Beane EXCISION/RELEASE BURSA HIP;  Surgeon: Jeffrey C Beane, MD;  Location: [facility] ORS;  Service: Orthopedics;  Laterality: Left;  Excision of Trochanteric Bursitis  . Colonoscopy 06/01/2012    Procedure: COLONOSCOPY;  Surgeon: Sandi L Fields, MD;  Location: [facility];  Service: Endoscopy;  Laterality: N/A;  1:30PM  . Eus 06/06/2012    Procedure: LOWER ENDOSCOPIC ULTRASOUND (EUS);  Surgeon: William Outlaw, MD;  Location: [facility] ENDOSCOPY;  Service: Endoscopy;  Laterality: N/A;    Current Outpatient Prescriptions  Medication Sig Dispense Refill  . estradiol (ESTRACE) 1 MG tablet Take 1 mg by mouth 2  (two) times daily.      . LORazepam (ATIVAN) 1 MG tablet Take 1 mg by mouth every 8 (eight) hours.         Allergies  Allergen Reactions  . Codeine Shortness Of Breath  . Ultram (Tramadol) Nausea Only     Family History  Problem Relation Age of Onset  . Colon cancer Neg Hx   . Other Sister      History   Social History  . Marital Status: Single    Spouse Name: N/A    Number of Children: 2  . 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 Smoking status: Never Smoker   . Smokeless tobacco: Never Used  . Alcohol Use: Yes     occasional couple times per mo  . Drug Use: No  . Sexually Active: Yes    Birth Control/ Protection: Surgical   Other Topics Concern  . None   Social History Narrative   Lives alone & 2 kids     REVIEW OF SYSTEMS - PERTINENT POSITIVES ONLY: 12 point review of systems negative other than HPI and PMH except for constipation.  EXAM: Filed Vitals:   06/11/12 1023  BP: 100/70  Pulse: 60  Temp: 97.9 F (36.6 C)  Resp: 12    Gen:  No acute distress.  Well nourished   and well groomed.   Neurological: Alert and oriented to person, place, and time. Coordination normal.  Head: Normocephalic and atraumatic.  Eyes: Conjunctivae are normal. Pupils are equal, round, and reactive to light. No scleral icterus.  Neck: Normal range of motion. Neck supple. No tracheal deviation or thyromegaly present.  Cardiovascular: Normal rate, regular rhythm, normal heart sounds and intact distal pulses.  Exam reveals no gallop and no friction rub.  No murmur heard. Respiratory: Effort normal.  No respiratory distress. No chest wall tenderness. Breath sounds normal.  No wheezes, rales or rhonchi.  GI: Soft. Bowel sounds are normal. The abdomen is soft and nontender.  There is no rebound and no guarding.  Rectal:  Mass not palpable.  Musculoskeletal: Normal range of motion. Extremities are  nontender.  Lymphadenopathy: No cervical, preauricular, postauricular or axillary adenopathy is present Skin: Skin is warm and dry. No rash noted. No diaphoresis. No erythema. No pallor. No clubbing, cyanosis, or edema.   Psychiatric: Normal mood and affect. Behavior is normal. Judgment and thought content normal.    LABORATORY RESULTS: Available labs are reviewed  HCT 43.2, Plts 226, Na 132, K 3.4, LFTs normal, CEA 0.8  RADIOLOGY RESULTS: See E-Chart or I-Site for most recent results.  Images and reports are reviewed. CT abd/pelvis IMPRESSION:  1. Nonspecific wall thickening involving the terminal ileum and  cecum. Differential includes inflammation, infection or  malignancy. Correlation with direct visualization is recommended.  2. No obstructing rectal mass identified.  3. No evidence for adenopathy within the abdomen or pelvis.  4. Nonspecific low density structure in the right hepatic lobe  measures 7 mm. Too small to characterize.     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 Clinical T2 N0. We'll proceed with up front surgery.  The patient is symptomatic  I will plan to do a laparoscopic low anterior resection without ileostomy as long as the anastomosis does not have any evidence of leak.  The surgery was described to the patient as well as the risks of surgery and the possible complications.  These include: Bleeding Infection and possible wound complications such as hernia Damage to adjacent structures Leak of surgical connections, which can lead to other surgeries and possibly an ostomy. (5-10%) Possible need for other procedures, such as abscess drains in radiology  Possible prolonged hospital stay Possible diarrhea from removal of part of the colon. Possible constipation from narcotics.    Prolonged fatigue/weakness/appetite  Difficulty with eating or post operative nausea  Possible  early recurrence of cancer Possible complications of your medical problems such as heart disease or arrhythmias. Death (less than 1%)  The patient understands and wishes to proceed. Of note, the patient works at [employer] and does not want to undergo any treatment in [facility]. She feels that this would compromise her confidentiality and make her uncomfortable. She is going to see an oncologist at the [place] campus of the [facility].     Lakaisha Danish L Ainhoa Rallo MD Surgical Oncology, General and Endocrine Surgery [facility]      Visit Diagnoses: 1. Rectal cancer, cT2N0, 7 cm from anal verge     Primary Care Physician: MOORE, DONALD, MD  FIELDS, SANDI, MD GI OUTLAW, WILLIAM, MD GI  

## 2012-07-02 NOTE — Interval H&P Note (Signed)
History and Physical Interval Note:  07/02/2012 1:36 PM  [name]  has presented today for surgery, with the diagnosis of rectal cancer  The various methods of treatment have been discussed with the patient and family. After consideration of risks, benefits and other options for treatment, the patient has consented to  Procedure(s) (LRB) with comments: LAPAROSCOPIC LOW ANTERIOR RESECTION (N/A) as a surgical intervention .  The patient's history has been reviewed, patient examined, no change in status, stable for surgery.  I have reviewed the patient's chart and labs.  Questions were answered to the patient's satisfaction.     Mac Dowdell

## 2012-07-02 NOTE — Op Note (Signed)
Pre op diagnosis:  cT2N0 rectal cancer at 7 cm  Post op diagnosis:  Same.  Procedure:  Laparoscopic low anterior resection with low pelvic anastamosis   Surgeon: Almond Lint   Assistants: Romie Levee  Anesthesia: General endotracheal anesthesia and local   ASA Class: 2  Procedure Details  The patient was seen in the Holding Room. The risks, benefits, complications, treatment options, and expected outcomes were discussed with the patient. The possibilities of perforation of viscus, bleeding, recurrent infection, having no residual cancer,  the need for additional procedures, failure to diagnose a condition, and creating a complication requiring transfusion or operation were discussed with the patient. The patient was advised that he would definitely have an ostomy.  The patient concurred with the proposed plan, giving informed consent.   The patient was taken to the operating room, identified, and the procedure verified as partial colectomy. A Time Out was held and the above information confirmed.  The patient was brought to the operating room and placed supine. The patient was placed in low lithotomy position.  After induction of a general anesthetic, a Foley catheter was inserted and the abdomen was prepped and draped in standard fashion. Local anesthetic was infiltrated at the left costal margin.  The patient was placed into reverse tren[place]leburg position and the patient was rotated to the right.  The skin was incised with a #11 blade.  The 5 mm optiview port was placed without complication.   Pneumoperitoneum was insufflated to a pressure of 15 mm Hg. The laparoscope was introduced.    Exploration revealed a normal omentum, colon, small bowel, peritoneum, liver, and stomach.  Two trocars were placed in the RLQ and 1 in the LLQ.  One was placed in the midline just above the umbilicus.  The omental adhesions were taken down from the midline incision.  The descending colon and sigmoid colon   were then mobilized with gentle retraction of the colon in a medial direction with mobilization of the peritoneal reflection with cautery and the EnSeal. Mobilization of this area was complete to expose the retroperitoneum. The ureter was identified during this mobilization process.  There was minimal blood loss during this portion of the procedure.      The mobilization continued to include the peritoneal reflection and the mesentery along both sides of the rectum. Posteriorly, the mesorectum came up easily off the sacrum.  The rectum was checked to make sure the mass was completely mobilized.  After completing mobilization, a hand port was placed via a pfannenstiel incision in her prior scar, and the contour stapler was used to divide the rectum.  The colon was delivered through the hand port.  The quality of the mesorectum was good.  2 cm were taken distal to the mass.  The specimen was submitted to pathology.  The mesentery was mobilized to allow the descending colon to reach to the rectal stump.   The staple line was taken off the descending colon and the sizers confirmed that the 33 mm EEA would be acceptable.    The anvil of the stapler was placed in the end of the colon and secured with a 0-0 Prolene pursestring suture.  I went to the rectal position and advanced the stapler through the anus.  The anvil was coupled with the stapler.  The adjacent tissues were held out of the way while the stapler was closed.  Pressure was held, then the stapler was fired.  The stapler was gently removed, and the anastamotic rings  were intact.  The proctoscope was used to examine the staple line, and no bleeding was seen.  The rectum was insufflated, and there were no bubbles seen coming up into the irrigant in the abdomen.      The pelvis was irrigated.   The OnQ tunneler was placed on both sides of the pfannenstiel incision.  The peritoneum of the hand port was closed with a 0-0 vicryl.  The fascia was closed with  running #1 PDS suture.  The skin of all the sites was closed with 4-0 monocryl and dressed with dermabond.      The OnQ catheters were advanced through the  tunneler sheaths and injected.    Instrument, sponge, and needle counts were correct prior to abdominal closure and at the conclusion of the case.   Findings:  Flat rectal cancer left posterolaterally located.  anastamosis at 5 cm from anal verge  Estimated Blood Loss: less than 50 mL         Drains: 19 Fr Blake drain in pelvis          Specimens: rectosigmoid colon and anastamotic rings, suture on proximal ring            Complications: None; patient tolerated the procedure well.         Disposition: PACU - hemodynamically stable.         Condition: stable

## 2012-07-02 NOTE — Telephone Encounter (Signed)
Called pt on cell phone and left message on voice mail re:  CT Chest was ok - insurance co. Will not approve  PET because CTs were ok.  Left message also that pt will be scheduled for a f/u appt with md after her surgery as per Dr. Lonell Face instructions. Pt's   Cell  Phone     [phone].

## 2012-07-02 NOTE — Transfer of Care (Signed)
Immediate Anesthesia Transfer of Care Note  Patient: [name]  Procedure(s) Performed: Procedure(s) (LRB) with comments: LAPAROSCOPIC LOW ANTERIOR RESECTION (N/A)  Patient Location: PACU  Anesthesia Type:General  Level of Consciousness: awake, alert  and oriented  Airway & Oxygen Therapy: Patient Spontanous Breathing and Patient connected to face mask oxygen  Post-op Assessment: Report given to PACU RN and Post -op Vital signs reviewed and stable  Post vital signs: Reviewed and stable  Complications: No apparent anesthesia complications

## 2012-07-02 NOTE — Anesthesia Preprocedure Evaluation (Addendum)
Anesthesia Evaluation  Patient identified by MRN, date of birth, ID band Patient awake    Reviewed: Allergy & Precautions, H&P , NPO status , Patient's Chart, lab work & pertinent test results  History of Anesthesia Complications (+) PONV  Airway Mallampati: I TM Distance: >3 FB Neck ROM: Full    Dental  (+) Dental Advisory Given and Teeth Intact   Pulmonary neg pulmonary ROS,  breath sounds clear to auscultation  Pulmonary exam normal       Cardiovascular - CAD and - Past MI Rhythm:Regular Rate:Normal     Neuro/Psych Anxiety negative neurological ROS     GI/Hepatic Neg liver ROS, GERD-  Medicated,  Endo/Other  negative endocrine ROS  Renal/GU Renal diseasenegative Renal ROS     Musculoskeletal negative musculoskeletal ROS (+)   Abdominal   Peds  Hematology negative hematology ROS (+)   Anesthesia Other Findings   Reproductive/Obstetrics                        Anesthesia Physical Anesthesia Plan  ASA: II  Anesthesia Plan: General   Post-op Pain Management:    Induction: Intravenous  Airway Management Planned: Oral ETT  Additional Equipment:   Intra-op Plan:   Post-operative Plan: Extubation in OR  Informed Consent: I have reviewed the patients History and Physical, chart, labs and discussed the procedure including the risks, benefits and alternatives for the proposed anesthesia with the patient or authorized representative who has indicated his/her understanding and acceptance.   Dental advisory given  Plan Discussed with: CRNA  Anesthesia Plan Comments:         Anesthesia Quick Evaluation

## 2012-07-03 ENCOUNTER — Encounter (HOSPITAL_COMMUNITY): Payer: Self-pay | Admitting: General Surgery

## 2012-07-03 LAB — CBC
Hemoglobin: 12.4 g/dL (ref 12.0–15.0)
MCH: 31.1 pg (ref 26.0–34.0)
MCV: 90.5 fL (ref 78.0–100.0)
Platelets: 170 10*3/uL (ref 150–400)
RBC: 3.99 MIL/uL (ref 3.87–5.11)
WBC: 8.3 10*3/uL (ref 4.0–10.5)

## 2012-07-03 LAB — BASIC METABOLIC PANEL
CO2: 25 mEq/L (ref 19–32)
Chloride: 103 mEq/L (ref 96–112)
Glucose, Bld: 160 mg/dL — ABNORMAL HIGH (ref 70–99)
Sodium: 136 mEq/L (ref 135–145)

## 2012-07-03 NOTE — Progress Notes (Addendum)
drsg to On-Q pump reinforced per Dr. Donell Beers. MD aware patient getting benadryl for itching to legs

## 2012-07-03 NOTE — Progress Notes (Signed)
1 Day Post-Op  Subjective: Doing well.  Some flatus yesterday.    Objective: Vital signs in last 24 hours: Temp:  [97.9 F (36.6 C)-98.8 F (37.1 C)] 98.8 F (37.1 C) (11/19 1000) Pulse Rate:  [48-88] 48  (11/19 1000) Resp:  [11-21] 18  (11/19 1000) BP: (94-123)/(49-99) 104/49 mmHg (11/19 1000) SpO2:  [97 %-100 %] 98 % (11/19 1000) Weight:  [180 lb (81.647 kg)] 180 lb (81.647 kg) (11/18 2000)    Intake/Output from previous day: 11/18 0701 - 11/19 0700 In: 4771.7 [I.V.:4171.7; IV Piggyback:600] Out: 2585 [Urine:2225; Drains:360] Intake/Output this shift: Total I/O In: 0  Out: 200 [Urine:200]  General appearance: alert, cooperative and no distress Resp: no respiratory distress GI: soft, + BS, wounds OK.  JP serosang Extremities: extremities normal, atraumatic, no cyanosis or edema  Lab Results:   [facility] 07/03/12 0440 07/02/12 2102  WBC 8.3 10.6*  HGB 12.4 11.9*  HCT 36.1 35.4*  PLT 170 172   BMET  Basename 07/03/12 0440 07/02/12 2102  NA 136 --  K 4.0 --  CL 103 --  CO2 25 --  GLUCOSE 160* --  BUN 6 --  CREATININE 0.62 0.62  CALCIUM 8.5 --   PT/INR No results found for this basename: LABPROT:2,INR:2 in the last 72 hours ABG No results found for this basename: PHART:2,PCO2:2,PO2:2,HCO3:2 in the last 72 hours  Studies/Results: No results found.  Anti-infectives: Anti-infectives     Start     Dose/Rate Route Frequency Ordered Stop   07/02/12 2200   metroNIDAZOLE (FLAGYL) IVPB 500 mg        500 mg 100 mL/hr over 60 Minutes Intravenous Every 8 hours 07/02/12 1827 07/03/12 0628   07/02/12 2000   ciprofloxacin (CIPRO) IVPB 400 mg        400 mg 200 mL/hr over 60 Minutes Intravenous Every 12 hours 07/02/12 1827 07/02/12 2209   07/02/12 1215   cefoTEtan (CEFOTAN) 2 g in dextrose 5 % 50 mL IVPB        2 g 100 mL/hr over 30 Minutes Intravenous  Once 07/02/12 1206 07/02/12 1345   07/02/12 1215   metroNIDAZOLE (FLAGYL) IVPB 500 mg        500 mg 100 mL/hr  over 60 Minutes Intravenous On call to O.R. 07/02/12 1206 07/02/12 1330          Assessment/Plan: s/p Procedure(s) (LRB) with comments: LAPAROSCOPIC LOW ANTERIOR RESECTION (N/A) PAS Advance diet Continue foley due to very low pelvic anastamosis, significant concern for urinary retention. Plan d/c foley 6 am.    LOS: 1 day    Keen Ewalt 07/03/2012

## 2012-07-03 NOTE — Care Management Note (Signed)
    Page 1 of 1   07/03/2012     1:45:07 PM   CARE MANAGEMENT NOTE 07/03/2012  Patient:  [name], [name]   Account Number:  [id number]  Date Initiated:  07/03/2012  Documentation initiated by:  Lorenda Ishihara  Subjective/Objective Assessment:   46 yo female admitted s/p LAR for rectal cancer. PTA lived at home with family     Action/Plan:   Home when stable   Anticipated DC Date:  07/03/2012   Anticipated DC Plan:  HOME/SELF CARE      DC Planning Services  CM consult      Choice offered to / List presented to:             Status of service:  Completed, signed off Medicare Important Message given?   (If response is "NO", the following Medicare IM given date fields will be blank) Date Medicare IM given:   Date Additional Medicare IM given:    Discharge Disposition:  HOME/SELF CARE  Per UR Regulation:  Reviewed for med. necessity/level of care/duration of stay  If discussed at Long Length of Stay Meetings, dates discussed:    Comments:

## 2012-07-04 ENCOUNTER — Encounter (HOSPITAL_COMMUNITY): Payer: Managed Care, Other (non HMO)

## 2012-07-04 LAB — CBC
HCT: 32.4 % — ABNORMAL LOW (ref 36.0–46.0)
Hemoglobin: 10.9 g/dL — ABNORMAL LOW (ref 12.0–15.0)
MCV: 91.5 fL (ref 78.0–100.0)
RBC: 3.54 MIL/uL — ABNORMAL LOW (ref 3.87–5.11)
WBC: 8.7 10*3/uL (ref 4.0–10.5)

## 2012-07-04 LAB — BASIC METABOLIC PANEL
BUN: 7 mg/dL (ref 6–23)
CO2: 25 mEq/L (ref 19–32)
Chloride: 107 mEq/L (ref 96–112)
GFR calc Af Amer: >90 mL/min (ref >90)
Glucose, Bld: 111 mg/dL — ABNORMAL HIGH (ref 70–99)
Potassium: 3.9 mEq/L (ref 3.5–5.1)

## 2012-07-04 MED ORDER — HYDROCODONE-ACETAMINOPHEN 5-325 MG PO TABS
1.0000 | ORAL_TABLET | ORAL | Status: DC | PRN
  Administered 2012-07-04 – 2012-07-05 (×2): 1 via ORAL
  Administered 2012-07-05 (×2): 2 via ORAL
  Filled 2012-07-04: qty 2
  Filled 2012-07-04 (×2): qty 1
  Filled 2012-07-04: qty 2

## 2012-07-04 MED ORDER — PANTOPRAZOLE SODIUM 40 MG PO TBEC
40.0000 mg | DELAYED_RELEASE_TABLET | Freq: Every day | ORAL | Status: DC
  Administered 2012-07-04: 40 mg via ORAL
  Filled 2012-07-04 (×2): qty 1

## 2012-07-04 NOTE — Progress Notes (Signed)
Patient ID: [name], female   DOB: [birth year], 46 y.o.   MRN: [id number] 2 Days Post-Op  Subjective: Continued flatus.  Minimal belching.  Tolerated clears.  On Q leaking.    Objective: Vital signs in last 24 hours: Temp:  [98.2 F (36.8 C)-98.8 F (37.1 C)] 98.3 F (36.8 C) (11/20 0205) Pulse Rate:  [47-58] 47  (11/20 0205) Resp:  [14-20] 18  (11/20 0333) BP: (99-112)/(49-63) 99/62 mmHg (11/20 0205) SpO2:  [97 %-100 %] 98 % (11/20 0333)    Intake/Output from previous day: 11/19 0701 - 11/20 0700 In: 2600 [P.O.:600; I.V.:2000] Out: 2420 [Urine:2350; Drains:70] Intake/Output this shift: Total I/O In: 1400 [P.O.:600; I.V.:800] Out: 1135 [Urine:1100; Drains:35]  General appearance: alert, cooperative and no distress Resp: no respiratory distress GI: soft, + BS, wounds OK.  JP serosang Extremities: extremities normal, atraumatic, no cyanosis or edema  Lab Results:   Basename 07/04/12 0421 07/03/12 0440  WBC 8.7 8.3  HGB 10.9* 12.4  HCT 32.4* 36.1  PLT 156 170   BMET  Basename 07/04/12 0421 07/03/12 0440  NA 137 136  K 3.9 4.0  CL 107 103  CO2 25 25  GLUCOSE 111* 160*  BUN 7 6  CREATININE 0.74 0.62  CALCIUM 8.3* 8.5   PT/INR No results found for this basename: LABPROT:2,INR:2 in the last 72 hours ABG No results found for this basename: PHART:2,PCO2:2,PO2:2,HCO3:2 in the last 72 hours  Studies/Results: No results found.  Anti-infectives: Anti-infectives     Start     Dose/Rate Route Frequency Ordered Stop   07/02/12 2200   metroNIDAZOLE (FLAGYL) IVPB 500 mg        500 mg 100 mL/hr over 60 Minutes Intravenous Every 8 hours 07/02/12 1827 07/03/12 0628   07/02/12 2000   ciprofloxacin (CIPRO) IVPB 400 mg        400 mg 200 mL/hr over 60 Minutes Intravenous Every 12 hours 07/02/12 1827 07/02/12 2209   07/02/12 1215   cefoTEtan (CEFOTAN) 2 g in dextrose 5 % 50 mL IVPB        2 g 100 mL/hr over 30 Minutes Intravenous  Once 07/02/12 1206 07/02/12 1345     07/02/12 1215   metroNIDAZOLE (FLAGYL) IVPB 500 mg        500 mg 100 mL/hr over 60 Minutes Intravenous On call to O.R. 07/02/12 1206 07/02/12 1330          Assessment/Plan: s/p Procedure(s) (LRB) with comments: LAPAROSCOPIC LOW ANTERIOR RESECTION (N/A) PAS Advance diet Continue foley due to very low pelvic anastamosis, significant concern for urinary retention. Plan d/c foley 6 am.  Advance diet Change to po narcotics.     LOS: 2 days    [facility] 07/04/2012

## 2012-07-04 NOTE — Progress Notes (Signed)
This patient is receiving IV Protonix. Based on criteria approved by the Pharmacy and Therapeutics Committee, this medication is being converted to the equivalent oral dose form. These criteria include:   . The patient is eating (either orally or per tube) and/or has been taking other orally administered medications for at least 24 hours.  . This patient has no evidence of active gastrointestinal bleeding or impaired GI absorption (gastrectomy, short bowel, patient on TNA or NPO).   If you have questions about this conversion, please contact the pharmacy department.  Clydene Fake, [facility] 07/04/2012 8:10 AM

## 2012-07-05 MED ORDER — HYDROCODONE-ACETAMINOPHEN 7.5-500 MG PO TABS: 1.0000 | ORAL_TABLET | Freq: Four times a day (QID) | ORAL | Status: DC | PRN

## 2012-07-05 MED ORDER — HYDROCODONE-ACETAMINOPHEN 5-325 MG PO TABS: 1.0000 | ORAL_TABLET | ORAL | Status: DC | PRN

## 2012-07-09 ENCOUNTER — Telehealth (INDEPENDENT_AMBULATORY_CARE_PROVIDER_SITE_OTHER): Payer: Self-pay | Admitting: General Surgery

## 2012-07-09 NOTE — Discharge Summary (Signed)
Physician Discharge Summary  Patient ID: [name] MRN: [id number] DOB/AGE: [birth year] 46 y.o.  Admit date: 07/02/2012 Discharge date: 07/09/2012  Admission Diagnoses: Rectal cancer  Discharge Diagnoses:  Same  Discharged Condition: stable  Hospital Course:  Patient is a 46 year old female who was admitted to the floor following her laparoscopic low anterior resection. She did quite well and passed gas on postoperative day one. Her diet was able to be advanced. She was able to void spontaneously following Foley catheter removal on postop day 2. She was able to transition off of the PCA to oral narcotics. She was able to ambulate independently. She does have some gas pain on the morning of postoperative day 3 that resolved on its own.  She did have a bowel movement as well.  She was discharged to home in improved condition.  Consults: None  Significant Diagnostic Studies: labs: HCT 32.4, BMET normal  Treatments: surgery: lap LAR  Discharge Exam: Blood pressure 116/80, pulse 64, temperature 97.8 F (36.6 C), temperature source Oral, resp. rate 16, height 5\' 6"  (1.676 m), weight 180 lb (81.647 kg), SpO2 98.00%. General appearance: alert, cooperative and no distress Resp: breathing comfortably GI: soft, non distended, approp tender at [place]ste[place].   Extremities: extremities normal, atraumatic, no cyanosis or edema  Disposition: 01-Home or Self Care  Discharge Orders    Future Appointments: Provider: Department: Dept Phone: Center:   07/23/2012 11:30 AM Almond Lint, MD [facility], [place] [phone] None   08/20/2012 3:00 PM Linard Millers, [facility] MEDICAL ONCOLOGY [phone] None   08/20/2012 4:00 PM Krista Blue [facility] MEDICAL ONCOLOGY [phone] None     Future Orders Please Complete By Expires   Discharge patient      Comments:   Home/self care f/u with Dr. Donell Beers as scheduled.       Medication List     As of 07/09/2012  3:20 PM    TAKE these medications         estradiol 1 MG tablet   Commonly known as: ESTRACE   Take 1 mg by mouth 2 (two) times daily.      HYDROcodone-acetaminophen 5-325 MG per tablet   Commonly known as: NORCO/VICODIN   Take 1-2 tablets by mouth every 4 (four) hours as needed for pain.      HYDROcodone-acetaminophen 7.5-500 MG per tablet   Commonly known as: LORTAB   Take 1 tablet by mouth every 6 (six) hours as needed for pain.      LORazepam 1 MG tablet   Commonly known as: ATIVAN   Take 1 mg by mouth every 8 (eight) hours as needed.           Follow-up Information    Follow up with [facility], [place]. On 07/23/2012. (, or  if symptoms worsen.)    Contact information:   [address] [place] [address] [phone]          Signed: Almond Lint 07/09/2012, 3:20 PM

## 2012-07-09 NOTE — Telephone Encounter (Signed)
Pt called to discuss possible constipation.  She has had a small, dark, mostly liquid stool.  Pt also saw her PCP for thrush in mouth and throat; on Nystatin rinse QID now.  Gave her suggestions on what to eat and drink to help with bowel movements, also to walk more, continue stool softeners daily and OK to take the Dulcolax pills she has on hand.

## 2012-07-11 ENCOUNTER — Other Ambulatory Visit: Payer: Self-pay | Admitting: *Deleted

## 2012-07-11 DIAGNOSIS — C20 Malignant neoplasm of rectum: Secondary | ICD-10-CM

## 2012-07-13 ENCOUNTER — Telehealth: Payer: Self-pay | Admitting: Hematology and Oncology

## 2012-07-13 ENCOUNTER — Telehealth: Payer: Self-pay | Admitting: *Deleted

## 2012-07-13 NOTE — Telephone Encounter (Signed)
R/s 1/6 genetics appt to 2/3 due to conflict in provider's schedule. lmonvm for pt and mailed new schedule.

## 2012-07-13 NOTE — Telephone Encounter (Signed)
Spoke with pt and was informed that pt is aware of appt with Dr. Dalene Carrow on 07/21/12.

## 2012-07-13 NOTE — Telephone Encounter (Signed)
appts made and pt called with appts for 1.7.14     anne

## 2012-07-16 ENCOUNTER — Telehealth (INDEPENDENT_AMBULATORY_CARE_PROVIDER_SITE_OTHER): Payer: Self-pay | Admitting: General Surgery

## 2012-07-16 NOTE — Telephone Encounter (Signed)
Pt called to report that she is experiencing pressure in her rectum when trying to sit and when walking. Not as severe when walking. Pain level is less over the last several days.Last Bowel movement was several days ago/ No ever reported or nausea/vomiting. Eating light meals at present/ Taking pain med twice a day. I reviewed his with Dr. Donell Beers and she said the pressure sensation is not unexpected. She advised that [name] continue stool softener and start Miralax 1 capful q day, also no suppositories or enemas, nothing should be inserted into rectum. Pt advised and will call if no improvement/gy

## 2012-07-23 ENCOUNTER — Encounter (INDEPENDENT_AMBULATORY_CARE_PROVIDER_SITE_OTHER): Payer: Self-pay | Admitting: General Surgery

## 2012-07-23 ENCOUNTER — Ambulatory Visit (INDEPENDENT_AMBULATORY_CARE_PROVIDER_SITE_OTHER): Payer: Managed Care, Other (non HMO) | Admitting: General Surgery

## 2012-07-23 VITALS — BP 118/74 | HR 84 | Temp 97.3°F | Resp 28 | Ht 66.0 in | Wt 172.8 lb

## 2012-07-23 DIAGNOSIS — C20 Malignant neoplasm of rectum: Secondary | ICD-10-CM

## 2012-07-23 DIAGNOSIS — R300 Dysuria: Secondary | ICD-10-CM

## 2012-07-23 MED ORDER — LORAZEPAM 1 MG PO TABS: 1.0000 mg | ORAL_TABLET | Freq: Three times a day (TID) | ORAL | Status: DC | PRN

## 2012-07-23 MED ORDER — HYDROCODONE-ACETAMINOPHEN 7.5-500 MG PO TABS: 1.0000 | ORAL_TABLET | Freq: Four times a day (QID) | ORAL | Status: DC | PRN

## 2012-07-23 NOTE — Patient Instructions (Signed)
Take ativan three times daily. Take miralax daily.  Get labs and urinalysis at [facility].

## 2012-07-23 NOTE — Progress Notes (Signed)
HISTORY: Patient is having significant pressure in her rectal region after her laparoscopic low anterior resection. She is getting spasms. She is also having pain when she urinates and her incision is stinging.  She is still having significant issues with weakness and fatigue. She has trouble sitting for any amount of time.  She is having chills, but no fevers.   She is only able to have small bowel movement at a time.     EXAM: General:  Alert and oriented.   Incision:  Healing well.     PATHOLOGY: 1. Colon, segmental resection for tumor, recto-sigmoid - INVASIVE MODERATELY DIFFERENTIATED ADENOCARCINOMA INVADING INTO BUT NOT THROUGH THE MUSCULARIS PROPRIA. - NO ANGIOLYMPHATIC INVASION IDENTIFIED. - SIXTEEN LYMPH NODES WITH FOCAL ENDOSALPINGIOSIS, NO EVIDENCE OF METASTATIC CARCINOMA(0/16). - RESECTION MARGINS, NEGATIVE FOR ATYPIA OR MALIGNANCY. 2. Colon, resection margin (donut) - BENIGN COLONIC MUCOSA, NO EVIDENCE OF MALIGNANCY.   ASSESSMENT AND PLAN:   Rectal cancer, cT2N0, 7 cm from anal verge Advised patient to take the ativan TID to help with the tenesmus.  Advised to minimize narcotics. Will get UA and labs.    Also advised to take Miralax daily.  It sounds as though she is not fully evacuating colon.      I will see her back in around 3 weeks.      Maudry Diego, MD Surgical Oncology, General & Endocrine Surgery [facility]  Rudi Heap, MD Ernestina Penna, MD

## 2012-07-23 NOTE — Assessment & Plan Note (Signed)
Advised patient to take the ativan TID to help with the tenesmus.  Advised to minimize narcotics. Will get UA and labs.    Also advised to take Miralax daily.  It sounds as though she is not fully evacuating colon.

## 2012-07-30 ENCOUNTER — Encounter: Payer: Managed Care, Other (non HMO) | Admitting: Genetic Counselor

## 2012-07-30 ENCOUNTER — Other Ambulatory Visit: Payer: Managed Care, Other (non HMO) | Admitting: Lab

## 2012-08-04 ENCOUNTER — Telehealth: Payer: Self-pay | Admitting: *Deleted

## 2012-08-04 NOTE — Telephone Encounter (Signed)
Pt called to reschedule appointments  on Jan 7,2014 with Dr. Dalene Carrow and lab, pt was given a new appt with Dr. Clelia Croft and the lab on Jan 7,2014. Pt agreed with time and provider change

## 2012-08-13 ENCOUNTER — Telehealth (INDEPENDENT_AMBULATORY_CARE_PROVIDER_SITE_OTHER): Payer: Self-pay

## 2012-08-13 NOTE — Telephone Encounter (Signed)
Pt called stating she has had constipation and a lot of rectal pressure. She states the pressure is better than 2 weeks ago but she still is not having BM on daily basis. Pt states she did have some BM today. Pt advised to increase fluids and  miralax to twice a day. Advised  if this does not improve regularity she is to call back for more recommendations. Pt states she has appt this afternoon with her PCP and will call with any concerns.

## 2012-08-15 DIAGNOSIS — C801 Malignant (primary) neoplasm, unspecified: Secondary | ICD-10-CM

## 2012-08-15 HISTORY — DX: Malignant (primary) neoplasm, unspecified: C80.1

## 2012-08-17 ENCOUNTER — Encounter (INDEPENDENT_AMBULATORY_CARE_PROVIDER_SITE_OTHER): Payer: Self-pay | Admitting: General Surgery

## 2012-08-17 ENCOUNTER — Ambulatory Visit (INDEPENDENT_AMBULATORY_CARE_PROVIDER_SITE_OTHER): Payer: Managed Care, Other (non HMO) | Admitting: General Surgery

## 2012-08-17 VITALS — BP 118/88 | HR 100 | Temp 99.0°F | Resp 16 | Ht 66.0 in | Wt 175.8 lb

## 2012-08-17 DIAGNOSIS — C20 Malignant neoplasm of rectum: Secondary | ICD-10-CM

## 2012-08-17 MED ORDER — HYDROCODONE-ACETAMINOPHEN 7.5-500 MG PO TABS: 1.0000 | ORAL_TABLET | Freq: Four times a day (QID) | ORAL | Status: DC | PRN

## 2012-08-17 NOTE — Progress Notes (Addendum)
HISTORY: Patient continues to have significant complaints of pressure in her rectal area. She has to have frequent bowel movements per day and constantly feels like she has to go to the bathroom. She is only able to have small bowel movements that time. She is not having quite as many spasms in her rectum and is taking the Ativan which has helped with that. She denies fevers and chills. Her activity continues to be limited by this pain in her perineal region.  She does think she has made significant improvements over the last 3 weeks since I have seen her.    EXAM: General:  Alert and oriented x3. Still looks very fatigued. Incision:  Abdominal incisions are healing well. There is no evidence of hernia. Rectal exam demonstrates slightly tight staple line at around 4 cm.  PATHOLOGY: 1. Colon, segmental resection for tumor, recto-sigmoid - INVASIVE MODERATELY DIFFERENTIATED ADENOCARCINOMA INVADING INTO BUT NOT THROUGH THE MUSCULARIS PROPRIA. - NO ANGIOLYMPHATIC INVASION IDENTIFIED. - SIXTEEN LYMPH NODES WITH FOCAL ENDOSALPINGIOSIS, NO EVIDENCE OF METASTATIC CARCINOMA(0/16). - RESECTION MARGINS, NEGATIVE FOR ATYPIA OR MALIGNANCY. 2. Colon, resection margin (donut) - BENIGN COLONIC MUCOSA, NO EVIDENCE OF MALIGNANCY. Microsatellite stable.    ASSESSMENT AND PLAN:   Rectal cancer, cT2N0, 7 cm from anal verge I think that the patient's sense of pressure that is secondary to heart or stools. However, her rectal exam does demonstrate that her staple line is a little bit tight.  I will see her back in 2 weeks. If she is continuing to have significant anal pressure and frequent stools at that time, I will take her to the OR for an exam under anesthesia and dilate the stricture.  I advised her to take ibuprofen. I refilled her Vicodin, which she is only taking around one pill per day at this time.      Maudry Diego, MD Surgical Oncology, General & Endocrine Surgery [facility]  Rudi Heap, MD Ernestina Penna, MD

## 2012-08-17 NOTE — Patient Instructions (Signed)
Heating pad or warm water baths.  Ibuprofen 600 mg TID  Follow up in 2 weeks.

## 2012-08-17 NOTE — Assessment & Plan Note (Signed)
I think that the patient's sense of pressure that is secondary to heart or stools. However, her rectal exam does demonstrate better staple line is a little bit tight.  I will see her back in 2 weeks. If she is continuing to have significant anal pressure and frequent stools at that time, I will take her to the OR for an exam under anesthesia and dilate the stricture.  I advised her to take ibuprofen. I refilled her Vicodin, which she is only taking around one pill per day at this time.

## 2012-08-18 ENCOUNTER — Encounter: Payer: Self-pay | Admitting: Oncology

## 2012-08-18 ENCOUNTER — Telehealth: Payer: Self-pay | Admitting: Oncology

## 2012-08-18 NOTE — Telephone Encounter (Signed)
S/w pt today re reassignment and appt for lb/FS on 1/7 @ 1:15pm. Former pt of LO

## 2012-08-19 ENCOUNTER — Other Ambulatory Visit: Payer: Self-pay | Admitting: Oncology

## 2012-08-19 DIAGNOSIS — C20 Malignant neoplasm of rectum: Secondary | ICD-10-CM

## 2012-08-20 ENCOUNTER — Other Ambulatory Visit: Payer: Managed Care, Other (non HMO) | Admitting: Lab

## 2012-08-20 ENCOUNTER — Encounter: Payer: Managed Care, Other (non HMO) | Admitting: Genetic Counselor

## 2012-08-21 ENCOUNTER — Ambulatory Visit: Payer: Managed Care, Other (non HMO) | Admitting: Hematology and Oncology

## 2012-08-21 ENCOUNTER — Ambulatory Visit (HOSPITAL_BASED_OUTPATIENT_CLINIC_OR_DEPARTMENT_OTHER): Payer: Managed Care, Other (non HMO) | Admitting: Oncology

## 2012-08-21 ENCOUNTER — Other Ambulatory Visit (HOSPITAL_BASED_OUTPATIENT_CLINIC_OR_DEPARTMENT_OTHER): Payer: Managed Care, Other (non HMO) | Admitting: Lab

## 2012-08-21 ENCOUNTER — Other Ambulatory Visit: Payer: Managed Care, Other (non HMO) | Admitting: Lab

## 2012-08-21 ENCOUNTER — Telehealth: Payer: Self-pay | Admitting: Oncology

## 2012-08-21 VITALS — BP 120/78 | HR 106 | Temp 97.0°F | Resp 18 | Ht 66.0 in | Wt 177.0 lb

## 2012-08-21 DIAGNOSIS — C20 Malignant neoplasm of rectum: Secondary | ICD-10-CM

## 2012-08-21 DIAGNOSIS — F411 Generalized anxiety disorder: Secondary | ICD-10-CM

## 2012-08-21 DIAGNOSIS — K5900 Constipation, unspecified: Secondary | ICD-10-CM

## 2012-08-21 LAB — COMPREHENSIVE METABOLIC PANEL (CC13)
BUN: 7 mg/dL (ref 7.0–26.0)
CO2: 27 mEq/L (ref 22–29)
Creatinine: 0.7 mg/dL (ref 0.6–1.1)
Glucose: 88 mg/dl (ref 70–99)
Sodium: 138 mEq/L (ref 136–145)
Total Bilirubin: 0.25 mg/dL (ref 0.20–1.20)
Total Protein: 7.4 g/dL (ref 6.4–8.3)

## 2012-08-21 LAB — CBC WITH DIFFERENTIAL/PLATELET
Basophils Absolute: 0 10*3/uL (ref 0.0–0.1)
Eosinophils Absolute: 0.1 10*3/uL (ref 0.0–0.5)
HCT: 39.9 % (ref 34.8–46.6)
HGB: 13.4 g/dL (ref 11.6–15.9)
LYMPH%: 32.7 % (ref 14.0–49.7)
MCV: 92.8 fL (ref 79.5–101.0)
MONO#: 0.4 10*3/uL (ref 0.1–0.9)
NEUT#: 3.7 10*3/uL (ref 1.5–6.5)
NEUT%: 59.6 % (ref 38.4–76.8)
Platelets: 192 10*3/uL (ref 145–400)
WBC: 6.2 10*3/uL (ref 3.9–10.3)

## 2012-08-21 NOTE — Progress Notes (Signed)
Hematology and Oncology Follow Up Visit  [name] [id number] 04-27-1966 47 y.o. 08/21/2012 2:17 PM   Principle Diagnosis: 47 year old diagnosed with T2N0 rectal cancer in 05/2012. Her tumor was 7-10 cm from the anal verge.    Prior Therapy: Patient is S/P laparoscopic LAR done on 07/02/2012. The pathology reveled: INVASIVE MODERATELY DIFFERENTIATED ADENOCARCINOMA INVADING INTO BUT NOTTHROUGH THE MUSCULARIS PROPRIA. - NO ANGIOLYMPHATIC INVASION IDENTIFIED. - SIXTEEN LYMPH NODES WITH FOCAL ENDOSALPINGIOSIS, NO EVIDENCE OF METASTATIC CARCINOMA(0/16). Her tumor is Microsatellite stable.   Current therapy: Observation and follow up.   Interim History: [name] presents for a follow up visit. She is a nice women with the above diagnosis. She underwent an LAR on on 07/02/2012. She is recovering better at this time. She still reports constipation and sensation of fullness in her rectal area. No bleeding noted.  No other pain or discomfort. Weight has been stable and has been able to drive.   Medications: I have reviewed the patient's current medications. Current outpatient prescriptions:HYDROcodone-acetaminophen (LORTAB 7.5) 7.5-500 MG per tablet, Take 1 tablet by mouth every 6 (six) hours as needed for pain., Disp: 50 tablet, Rfl: 1;  LORazepam (ATIVAN) 1 MG tablet, Take 1 tablet (1 mg total) by mouth every 8 (eight) hours as needed., Disp: 90 tablet, Rfl: 1  Allergies:  Allergies  Allergen Reactions  . Codeine Nausea Only    Confirmed intolerance of codeine verbally with pt in PACU, pt states does not cause any SOB or dyspnea, some nausea only (MN-RN)  . Ultram (Tramadol) Nausea And Vomiting    Ultram allergy also discussed, active N&V occurs with Ultram  . Latex Itching and Rash    Past Medical History, Surgical history, Social history, and Family History were reviewed and updated.  Review of Systems: Constitutional:  Negative for fever, chills, night sweats, anorexia, weight  loss, pain. Cardiovascular: no chest pain or dyspnea on exertion Respiratory: no cough, shortness of breath, or wheezing Neurological: negative Dermatological: negative ENT: negative Skin: Negative. Gastrointestinal: negative Genito-Urinary: no dysuria, trouble voiding, or hematuria Hematological and Lymphatic: negative Breast: negative for breast lumps Musculoskeletal: negative Remaining ROS negative. Physical Exam: Blood pressure 120/78, pulse 106, temperature 97 F (36.1 C), temperature source Oral, resp. rate 18, height 5\' 6"  (1.676 m), weight 177 lb (80.287 kg). ECOG: 1 General appearance: alert Head: Normocephalic, without obvious abnormality, atraumatic Neck: no adenopathy, no carotid bruit, no JVD, supple, symmetrical, trachea midline and thyroid not enlarged, symmetric, no tenderness/mass/nodules Lymph nodes: Cervical, supraclavicular, and axillary nodes normal. Heart:regular rate and rhythm, S1, S2 normal, no murmur, click, rub or gallop Lung:chest clear, no wheezing, rales, normal symmetric air entry Abdomin: soft, non-tender, without masses or organomegaly EXT:no erythema, induration, or nodules   Lab Results: Lab Results  Component Value Date   WBC 6.2 08/21/2012   HGB 13.4 08/21/2012   HCT 39.9 08/21/2012   MCV 92.8 08/21/2012   PLT 192 08/21/2012     Chemistry      Component Value Date/Time   NA 138 08/21/2012 1322   NA 137 07/04/2012 0421   K 3.9 08/21/2012 1322   K 3.9 07/04/2012 0421   CL 106 08/21/2012 1322   CL 107 07/04/2012 0421   CO2 27 08/21/2012 1322   CO2 25 07/04/2012 0421   BUN 7.0 08/21/2012 1322   BUN 7 07/04/2012 0421   CREATININE 0.7 08/21/2012 1322   CREATININE 0.74 07/04/2012 0421      Component Value Date/Time   CALCIUM 9.3  08/21/2012 1322   CALCIUM 8.3* 07/04/2012 0421   ALKPHOS 59 08/21/2012 1322   ALKPHOS 48 06/01/2012 1452   AST 14 08/21/2012 1322   AST 16 06/01/2012 1452   ALT 11 08/21/2012 1322   ALT 11 06/01/2012 1452   BILITOT 0.25 08/21/2012  1322   BILITOT 0.4 06/01/2012 1452      Impression and Plan:  47 year old with the following issues:  1. T2N0 rectal cancer diagnosed in 05/2012. She is S/P LAR in 06/2012. I discussed the pathology report with her at this point. I disused with there the fact that there is no role for adjuvant chemotherapy at this time.  However, she will need strict follow follow up with CT scans + labs + physical exams.  Her next evaluation will be in 3 months.  She will need genetic testing at some point as well.   2. Colonoscopic screen: She will be due in 06/2012.   3. Anxiety: she is very anxious about her diagnosis and in general. I answered all of her questions for today.     Lisa Besser, MD 1/7/20142:17 PM

## 2012-08-21 NOTE — Telephone Encounter (Signed)
appts made and printed for pt  Pt aware that she will get a call with her scan appt And contrast given     [name]

## 2012-08-23 ENCOUNTER — Telehealth (INDEPENDENT_AMBULATORY_CARE_PROVIDER_SITE_OTHER): Payer: Self-pay

## 2012-08-23 NOTE — Telephone Encounter (Signed)
The pt called requesting a refill on Lorazepam 1 mg prn.  She uses [facility] in [place].  Dr Donell Beers is in surgery all day so I will route this message to her and her nurse.

## 2012-08-23 NOTE — Telephone Encounter (Signed)
I called and let the pt know that I called in her refill.  I called Lorazepam 1 mg q 8 hrs prn #60 with one additional refill to the [facility] in [place].

## 2012-08-23 NOTE — Telephone Encounter (Signed)
OK to refill.  Number 60, 1 refill.

## 2012-08-31 ENCOUNTER — Encounter (INDEPENDENT_AMBULATORY_CARE_PROVIDER_SITE_OTHER): Payer: Self-pay | Admitting: General Surgery

## 2012-08-31 ENCOUNTER — Ambulatory Visit (INDEPENDENT_AMBULATORY_CARE_PROVIDER_SITE_OTHER): Payer: Managed Care, Other (non HMO) | Admitting: General Surgery

## 2012-08-31 VITALS — BP 120/80 | HR 78 | Temp 97.0°F | Resp 12 | Ht 66.0 in | Wt 176.4 lb

## 2012-08-31 DIAGNOSIS — C20 Malignant neoplasm of rectum: Secondary | ICD-10-CM

## 2012-08-31 NOTE — Progress Notes (Signed)
HISTORY: Patient continues to have difficulty evacuating her bowels. The pressure has improved since using Dulcolax. However the Dulcolax causes her to go for 12 hours and have an extremely raw perineal area.  Last night she sat on the toilet all night long. Because of this, she has had a hemorrhoid that has come up. She has some blood when she wipes. She is very fatigued due to the discomfort.    EXAM: General:  Alert and oriented. Is able to sit more comfortably. Incision:  Staple line is tight and is at approximately 5 cm. It is barely able to permit fingertip passage.   ASSESSMENT AND PLAN:   Rectal cancer The patient continues to have difficulty evacuating her bowels. I will schedule her for an exam under anesthesia and dilation of the anastomosis. If she has internal hemorrhoids, I can band these. I would not remove the external hemorrhoid at this time because I would not want to add additional discomfort.      Torrey Ballinas L Cordelle Dahmen, MD Surgical Oncology, General & Endocrine Surgery [facility]  MOORE, DONALD, MD Moore, Donald W, MD    

## 2012-08-31 NOTE — Patient Instructions (Signed)
Will plan Exam under anesthesia.  Follow up in 3 weeks.

## 2012-09-03 ENCOUNTER — Encounter (HOSPITAL_BASED_OUTPATIENT_CLINIC_OR_DEPARTMENT_OTHER): Payer: Self-pay | Admitting: *Deleted

## 2012-09-03 NOTE — Progress Notes (Signed)
Pt told about coming in for UA per Dr Donell Beers Will do fleet enema per order

## 2012-09-05 ENCOUNTER — Encounter (HOSPITAL_BASED_OUTPATIENT_CLINIC_OR_DEPARTMENT_OTHER)
Admission: RE | Admit: 2012-09-05 | Discharge: 2012-09-05 | Disposition: A | Discharge status: Home / Self Care | Payer: Managed Care, Other (non HMO) | Source: Ambulatory Visit | Attending: General Surgery | Admitting: General Surgery

## 2012-09-05 LAB — URINALYSIS, ROUTINE W REFLEX MICROSCOPIC
Nitrite: NEGATIVE
Specific Gravity, Urine: 1.011 (ref 1.005–1.030)
Urobilinogen, UA: 0.2 mg/dL (ref 0.0–1.0)
pH: 5.5 (ref 5.0–8.0)

## 2012-09-05 LAB — URINE MICROSCOPIC-ADD ON

## 2012-09-07 ENCOUNTER — Encounter (HOSPITAL_BASED_OUTPATIENT_CLINIC_OR_DEPARTMENT_OTHER): Payer: Self-pay | Admitting: Anesthesiology

## 2012-09-07 ENCOUNTER — Encounter (HOSPITAL_BASED_OUTPATIENT_CLINIC_OR_DEPARTMENT_OTHER)
Admission: RE | Disposition: A | Discharge status: Home / Self Care | Payer: Self-pay | Source: Ambulatory Visit | Attending: General Surgery

## 2012-09-07 ENCOUNTER — Ambulatory Visit (HOSPITAL_BASED_OUTPATIENT_CLINIC_OR_DEPARTMENT_OTHER): Payer: Managed Care, Other (non HMO) | Admitting: Anesthesiology

## 2012-09-07 ENCOUNTER — Encounter (HOSPITAL_BASED_OUTPATIENT_CLINIC_OR_DEPARTMENT_OTHER): Payer: Self-pay | Admitting: *Deleted

## 2012-09-07 ENCOUNTER — Ambulatory Visit (HOSPITAL_BASED_OUTPATIENT_CLINIC_OR_DEPARTMENT_OTHER)
Admission: RE | Admit: 2012-09-07 | Discharge: 2012-09-07 | Disposition: A | Discharge status: Home / Self Care | Payer: Managed Care, Other (non HMO) | Source: Ambulatory Visit | Attending: General Surgery | Admitting: General Surgery

## 2012-09-07 ENCOUNTER — Encounter (HOSPITAL_BASED_OUTPATIENT_CLINIC_OR_DEPARTMENT_OTHER): Payer: Self-pay | Admitting: General Surgery

## 2012-09-07 DIAGNOSIS — K624 Stenosis of anus and rectum: Secondary | ICD-10-CM | POA: Insufficient documentation

## 2012-09-07 DIAGNOSIS — C20 Malignant neoplasm of rectum: Secondary | ICD-10-CM | POA: Insufficient documentation

## 2012-09-07 DIAGNOSIS — K648 Other hemorrhoids: Secondary | ICD-10-CM

## 2012-09-07 DIAGNOSIS — Z01812 Encounter for preprocedural laboratory examination: Secondary | ICD-10-CM | POA: Insufficient documentation

## 2012-09-07 HISTORY — PX: PROCTOSCOPY: SHX2266

## 2012-09-07 HISTORY — PX: EXAMINATION UNDER ANESTHESIA: SHX1540

## 2012-09-07 SURGERY — EXAM UNDER ANESTHESIA
Anesthesia: General | Site: Rectum | Wound class: Dirty or Infected

## 2012-09-07 MED ORDER — SODIUM CHLORIDE 0.9 % IJ SOLN: 3.0000 mL | Freq: Two times a day (BID) | INTRAMUSCULAR | Status: DC

## 2012-09-07 MED ORDER — FENTANYL CITRATE 0.05 MG/ML IJ SOLN
INTRAMUSCULAR | Status: DC | PRN
  Administered 2012-09-07: 100 ug via INTRAVENOUS

## 2012-09-07 MED ORDER — HYDROCODONE-ACETAMINOPHEN 7.5-500 MG PO TABS: 1.0000 | ORAL_TABLET | Freq: Four times a day (QID) | ORAL | Status: DC | PRN

## 2012-09-07 MED ORDER — MIDAZOLAM HCL 5 MG/5ML IJ SOLN
INTRAMUSCULAR | Status: DC | PRN
  Administered 2012-09-07: 2 mg via INTRAVENOUS

## 2012-09-07 MED ORDER — SCOPOLAMINE 1 MG/3DAYS TD PT72
MEDICATED_PATCH | TRANSDERMAL | Status: DC | PRN
  Administered 2012-09-07: 1.5 mg via TRANSDERMAL

## 2012-09-07 MED ORDER — BUPIVACAINE LIPOSOME 1.3 % IJ SUSP
INTRAMUSCULAR | Status: DC | PRN
  Administered 2012-09-07: 10 mL

## 2012-09-07 MED ORDER — POLYETHYLENE GLYCOL 3350 17 G PO PACK: 17.0000 g | PACK | Freq: Every day | ORAL | Status: DC

## 2012-09-07 MED ORDER — DEXAMETHASONE SODIUM PHOSPHATE 4 MG/ML IJ SOLN
INTRAMUSCULAR | Status: DC | PRN
  Administered 2012-09-07: 10 mg via INTRAVENOUS

## 2012-09-07 MED ORDER — HYDROMORPHONE HCL PF 1 MG/ML IJ SOLN
0.2500 mg | INTRAMUSCULAR | Status: DC | PRN
  Administered 2012-09-07 (×4): 0.5 mg via INTRAVENOUS

## 2012-09-07 MED ORDER — FLEET ENEMA 7-19 GM/118ML RE ENEM: 1.0000 | ENEMA | Freq: Once | RECTAL | Status: DC

## 2012-09-07 MED ORDER — OXYCODONE HCL 5 MG PO TABS: 5.0000 mg | ORAL_TABLET | Freq: Once | ORAL | Status: DC | PRN

## 2012-09-07 MED ORDER — ACETAMINOPHEN 325 MG PO TABS: 650.0000 mg | ORAL_TABLET | ORAL | Status: DC | PRN

## 2012-09-07 MED ORDER — ONDANSETRON HCL 4 MG/2ML IJ SOLN
INTRAMUSCULAR | Status: DC | PRN
  Administered 2012-09-07: 4 mg via INTRAVENOUS

## 2012-09-07 MED ORDER — LACTATED RINGERS IV SOLN
INTRAVENOUS | Status: DC
  Administered 2012-09-07: 12:00:00 via INTRAVENOUS
  Administered 2012-09-07: 20 mL/h via INTRAVENOUS

## 2012-09-07 MED ORDER — OXYCODONE HCL 5 MG/5ML PO SOLN: 5.0000 mg | Freq: Once | ORAL | Status: DC | PRN

## 2012-09-07 MED ORDER — OXYCODONE HCL 5 MG PO TABS: 5.0000 mg | ORAL_TABLET | ORAL | Status: DC | PRN

## 2012-09-07 MED ORDER — ACETAMINOPHEN 650 MG RE SUPP: 650.0000 mg | RECTAL | Status: DC | PRN

## 2012-09-07 MED ORDER — PROPOFOL 10 MG/ML IV BOLUS
INTRAVENOUS | Status: DC | PRN
  Administered 2012-09-07: 200 mg via INTRAVENOUS

## 2012-09-07 MED ORDER — LIDOCAINE HCL (CARDIAC) 20 MG/ML IV SOLN
INTRAVENOUS | Status: DC | PRN
  Administered 2012-09-07: 70 mg via INTRAVENOUS

## 2012-09-07 MED ORDER — DIBUCAINE 1 % EX OINT: TOPICAL_OINTMENT | Freq: Three times a day (TID) | CUTANEOUS | Status: DC | PRN

## 2012-09-07 MED ORDER — HYDROCORTISONE 2.5 % RE CREA: TOPICAL_CREAM | Freq: Two times a day (BID) | RECTAL | Status: DC

## 2012-09-07 MED ORDER — SODIUM CHLORIDE 0.9 % IV SOLN: 250.0000 mL | INTRAVENOUS | Status: DC | PRN

## 2012-09-07 MED ORDER — ONDANSETRON HCL 4 MG/2ML IJ SOLN: 4.0000 mg | Freq: Once | INTRAMUSCULAR | Status: DC | PRN

## 2012-09-07 MED ORDER — ONDANSETRON HCL 4 MG/2ML IJ SOLN: 4.0000 mg | Freq: Four times a day (QID) | INTRAMUSCULAR | Status: DC | PRN

## 2012-09-07 MED ORDER — DEXTROSE 5 % IV SOLN: 2.0000 g | INTRAVENOUS | Status: DC

## 2012-09-07 MED ORDER — SODIUM CHLORIDE 0.9 % IJ SOLN: 3.0000 mL | INTRAMUSCULAR | Status: DC | PRN

## 2012-09-07 SURGICAL SUPPLY — 49 items
BLADE HEX COATED 2.75 (ELECTRODE) ×3 IMPLANT
BLADE SURG 11 STRL SS (BLADE) IMPLANT
BLADE SURG 15 STRL LF DISP TIS (BLADE) ×2 IMPLANT
BLADE SURG 15 STRL SS (BLADE) ×3
BRIEF STRETCH FOR OB PAD LRG (UNDERPADS AND DIAPERS) ×3 IMPLANT
CANISTER SUCTION 1200CC (MISCELLANEOUS) ×3 IMPLANT
CLOTH BEACON ORANGE TIMEOUT ST (SAFETY) ×3 IMPLANT
COVER MAYO STAND STRL (DRAPES) ×3 IMPLANT
COVER TABLE BACK 60X90 (DRAPES) IMPLANT
DECANTER SPIKE VIAL GLASS SM (MISCELLANEOUS) IMPLANT
DRAPE PED LAPAROTOMY (DRAPES) IMPLANT
DRAPE UTILITY XL STRL (DRAPES) ×3 IMPLANT
DRSG PAD ABDOMINAL 8X10 ST (GAUZE/BANDAGES/DRESSINGS) ×3 IMPLANT
ELECT REM PT RETURN 9FT ADLT (ELECTROSURGICAL) ×3
ELECTRODE REM PT RTRN 9FT ADLT (ELECTROSURGICAL) ×2 IMPLANT
GAUZE SPONGE 4X4 12PLY STRL LF (GAUZE/BANDAGES/DRESSINGS) IMPLANT
GAUZE SPONGE 4X4 16PLY XRAY LF (GAUZE/BANDAGES/DRESSINGS) IMPLANT
GLOVE BIO SURGEON STRL SZ 6 (GLOVE) IMPLANT
GLOVE BIOGEL PI IND STRL 6.5 (GLOVE) ×2 IMPLANT
GLOVE BIOGEL PI IND STRL 7.0 (GLOVE) ×1 IMPLANT
GLOVE BIOGEL PI IND STRL 7.5 (GLOVE) ×1 IMPLANT
GLOVE BIOGEL PI INDICATOR 6.5 (GLOVE) ×1
GLOVE BIOGEL PI INDICATOR 7.0 (GLOVE) ×1
GLOVE BIOGEL PI INDICATOR 7.5 (GLOVE) ×1
GLOVE SKINSENSE NS SZ6.5 (GLOVE) ×2
GLOVE SKINSENSE STRL SZ6.5 (GLOVE) ×2 IMPLANT
GOWN PREVENTION PLUS XLARGE (GOWN DISPOSABLE) ×3 IMPLANT
GOWN PREVENTION PLUS XXLARGE (GOWN DISPOSABLE) ×3 IMPLANT
NDL HYPO 25X1 1.5 SAFETY (NEEDLE) ×1 IMPLANT
NEEDLE HYPO 25X1 1.5 SAFETY (NEEDLE) ×3 IMPLANT
NS IRRIG 1000ML POUR BTL (IV SOLUTION) ×1 IMPLANT
PACK BASIN DAY SURGERY FS (CUSTOM PROCEDURE TRAY) ×3 IMPLANT
PACK LITHOTOMY IV (CUSTOM PROCEDURE TRAY) ×3 IMPLANT
PENCIL BUTTON HOLSTER BLD 10FT (ELECTRODE) ×3 IMPLANT
SHEARS HARMONIC 9CM CVD (BLADE) ×3 IMPLANT
SLEEVE SCD COMPRESS KNEE MED (MISCELLANEOUS) ×3 IMPLANT
SPONGE LAP 18X18 X RAY DECT (DISPOSABLE) ×2 IMPLANT
SPONGE LAP 4X18 X RAY DECT (DISPOSABLE) ×3 IMPLANT
SPONGE SURGIFOAM ABS GEL 100 (HEMOSTASIS) IMPLANT
SURGILUBE 2OZ TUBE FLIPTOP (MISCELLANEOUS) ×9 IMPLANT
SUT CHROMIC 3 0 SH 27 (SUTURE) ×3 IMPLANT
SYR CONTROL 10ML LL (SYRINGE) ×3 IMPLANT
TOWEL OR 17X24 6PK STRL BLUE (TOWEL DISPOSABLE) ×3 IMPLANT
TOWEL OR NON WOVEN STRL DISP B (DISPOSABLE) ×3 IMPLANT
TRAY DSU PREP LF (CUSTOM PROCEDURE TRAY) ×3 IMPLANT
TUBE CONNECTING 20X1/4 (TUBING) ×3 IMPLANT
UNDERPAD 30X30 INCONTINENT (UNDERPADS AND DIAPERS) ×3 IMPLANT
WATER STERILE IRR 1000ML POUR (IV SOLUTION) ×1 IMPLANT
YANKAUER SUCT BULB TIP NO VENT (SUCTIONS) ×3 IMPLANT

## 2012-09-07 NOTE — H&P (View-Only) (Signed)
HISTORY: Patient continues to have difficulty evacuating her bowels. The pressure has improved since using Dulcolax. However the Dulcolax causes her to go for 12 hours and have an extremely raw perineal area.  Last night she sat on the toilet all night long. Because of this, she has had a hemorrhoid that has come up. She has some blood when she wipes. She is very fatigued due to the discomfort.    EXAM: General:  Alert and oriented. Is able to sit more comfortably. Incision:  Staple line is tight and is at approximately 5 cm. It is barely able to permit fingertip passage.   ASSESSMENT AND PLAN:   Rectal cancer The patient continues to have difficulty evacuating her bowels. I will schedule her for an exam under anesthesia and dilation of the anastomosis. If she has internal hemorrhoids, I can band these. I would not remove the external hemorrhoid at this time because I would not want to add additional discomfort.      Maudry Diego, MD Surgical Oncology, General & Endocrine Surgery [facility]  Rudi Heap, MD Ernestina Penna, MD

## 2012-09-07 NOTE — Op Note (Signed)
See brief op note

## 2012-09-07 NOTE — Transfer of Care (Signed)
Immediate Anesthesia Transfer of Care Note  Patient: [name]  Procedure(s) Performed: Procedure(s) (LRB) with comments: EXAM UNDER ANESTHESIA (N/A) PROCTOSCOPY (N/A)  Patient Location: PACU  Anesthesia Type:General  Level of Consciousness: sedated  Airway & Oxygen Therapy: Patient Spontanous Breathing and Patient connected to face mask oxygen  Post-op Assessment: Report given to PACU RN and Post -op Vital signs reviewed and stable  Post vital signs: Reviewed and stable  Complications: No apparent anesthesia complications

## 2012-09-07 NOTE — Brief Op Note (Signed)
09/07/2012  2:31 PM  PATIENT:  [name]  47 y.o. female  PRE-OPERATIVE DIAGNOSIS:  rectal cancer, tight anastamosis  POST-OPERATIVE DIAGNOSIS:  rectal cancer, tight anastamosis  PROCEDURE:  Procedure(s) (LRB) with comments: EXAM UNDER ANESTHESIA (N/A) PROCTOSCOPY (N/A) Dilation of anal stricture Banding of left anterior internal hemorrhoid.    SURGEON:  Surgeon(s) and Role:    * Almond Lint, MD - Primary  ANESTHESIA:   local and general  EBL:     BLOOD ADMINISTERED:none  DRAINS: none   LOCAL MEDICATIONS USED:  OTHER Exparel  SPECIMEN:  No Specimen  DISPOSITION OF SPECIMEN:  N/A  COUNTS:  YES  DICTATION: .Dragon Dictation  FINDINGS:  Circumferential external hemorrhoids.  Anastamotic stricture at 10 cm that was 1 cm in diameter prior to procedure. Dilated up to 2.5 cm.    PLAN OF CARE: Discharge to home after PACU  PATIENT DISPOSITION:  PACU - hemodynamically stable.   PROCEDURE:  Patient was identified in the holding area to the operating room where she was placed to the operating room table. General anesthesia was induced. She was placed into the low lithotomy position. Timeout was performed according to the surgical safety checklist. When all was correct, we continued.   The patient's anus was examined digitally. There were circumferential external hemorrhoids.  There was evidence that one of these have thrombosis of the posterior left aspect. The anastomosis appeared to be a subacute. There was one hemorrhoid that did start internally and this was banded.  Proctoscope was used to evaluate the anastomosis. The anastomosis was around 10 cm. A proctoscope was initially unable to get through the staple line without difficulty. The staple line was dilated up to 2 fingerbreadths. Pressure was held for 3 minutes.  This was done x3.    The Gelfoam cylinder along with dibucaine ointment was inserted in the anus and placed at the level of the anastomosis.  The  patient is a perineum was cleaned, dried, and dressed with gauze, ABDs and mesh underwear. The patient was awakened anesthesia and taken to PACU in stable condition. Needle sponge and instrument counts were correct x2.

## 2012-09-07 NOTE — Anesthesia Postprocedure Evaluation (Signed)
  Anesthesia Post-op Note  Patient: [name]  Procedure(s) Performed: Procedure(s) (LRB) with comments: EXAM UNDER ANESTHESIA (N/A) PROCTOSCOPY (N/A)  Patient Location: PACU  Anesthesia Type:General  Level of Consciousness: awake, alert  and oriented  Airway and Oxygen Therapy: Patient Spontanous Breathing  Post-op Pain: mild  Post-op Assessment: Post-op Vital signs reviewed  Post-op Vital Signs: Reviewed  Complications: No apparent anesthesia complications

## 2012-09-07 NOTE — Interval H&P Note (Signed)
History and Physical Interval Note:  09/07/2012 1:27 PM  [name]  has presented today for surgery, with the diagnosis of rectal cancer  The various methods of treatment have been discussed with the patient and family. After consideration of risks, benefits and other options for treatment, the patient has consented to  Procedure(s) (LRB) with comments: EXAM UNDER ANESTHESIA WITH HEMORRHOIDECTOMY AND PROCTOSCOPY (N/A) - exam under anesthesia dilation of the anastomosis as a surgical intervention .  The patient's history has been reviewed, patient examined, no change in status, stable for surgery.  I have reviewed the patient's chart and labs.  Questions were answered to the patient's satisfaction.     Antuan Limes

## 2012-09-07 NOTE — Anesthesia Procedure Notes (Signed)
Procedure Name: LMA Insertion Date/Time: 09/07/2012 1:43 PM Performed by: Burna Cash Pre-anesthesia Checklist: Patient identified, Emergency Drugs available, Suction available and Patient being monitored Patient Re-evaluated:Patient Re-evaluated prior to inductionOxygen Delivery Method: Circle System Utilized Preoxygenation: Pre-oxygenation with 100% oxygen Intubation Type: IV induction Ventilation: Mask ventilation without difficulty LMA: LMA inserted LMA Size: 4.0 Number of attempts: 1 Airway Equipment and Method: bite block Placement Confirmation: positive ETCO2 Tube secured with: Tape Dental Injury: Teeth and Oropharynx as per pre-operative assessment

## 2012-09-07 NOTE — Anesthesia Preprocedure Evaluation (Signed)
Anesthesia Evaluation  Patient identified by MRN, date of birth, ID band Patient awake    Reviewed: Allergy & Precautions, H&P , NPO status , Patient's Chart, lab work & pertinent test results  History of Anesthesia Complications (+) PONV  Airway Mallampati: I TM Distance: >3 FB Neck ROM: Full    Dental  (+) Teeth Intact   Pulmonary  breath sounds clear to auscultation        Cardiovascular Rhythm:Regular Rate:Normal     Neuro/Psych    GI/Hepatic GERD-  Medicated and Controlled,  Endo/Other    Renal/GU      Musculoskeletal   Abdominal   Peds  Hematology   Anesthesia Other Findings   Reproductive/Obstetrics                           Anesthesia Physical Anesthesia Plan  ASA: II  Anesthesia Plan: General   Post-op Pain Management:    Induction: Intravenous  Airway Management Planned: LMA and Oral ETT  Additional Equipment:   Intra-op Plan:   Post-operative Plan: Extubation in OR  Informed Consent: I have reviewed the patients History and Physical, chart, labs and discussed the procedure including the risks, benefits and alternatives for the proposed anesthesia with the patient or authorized representative who has indicated his/her understanding and acceptance.   Dental advisory given  Plan Discussed with: CRNA, Anesthesiologist and Surgeon  Anesthesia Plan Comments:         Anesthesia Quick Evaluation

## 2012-09-10 ENCOUNTER — Encounter (HOSPITAL_BASED_OUTPATIENT_CLINIC_OR_DEPARTMENT_OTHER): Payer: Self-pay | Admitting: General Surgery

## 2012-09-17 ENCOUNTER — Encounter: Payer: Managed Care, Other (non HMO) | Admitting: Genetic Counselor

## 2012-09-17 ENCOUNTER — Other Ambulatory Visit: Payer: Managed Care, Other (non HMO) | Admitting: Lab

## 2012-09-19 ENCOUNTER — Telehealth: Payer: Self-pay | Admitting: Oncology

## 2012-09-19 NOTE — Telephone Encounter (Signed)
lvm for pt and advised on appt schedule change...sent pt appt schedule for April.Lisa KitchenMarland [name]

## 2012-10-02 ENCOUNTER — Ambulatory Visit (INDEPENDENT_AMBULATORY_CARE_PROVIDER_SITE_OTHER): Payer: Managed Care, Other (non HMO) | Admitting: General Surgery

## 2012-10-02 ENCOUNTER — Other Ambulatory Visit (INDEPENDENT_AMBULATORY_CARE_PROVIDER_SITE_OTHER): Payer: Self-pay

## 2012-10-02 ENCOUNTER — Encounter (INDEPENDENT_AMBULATORY_CARE_PROVIDER_SITE_OTHER): Payer: Self-pay | Admitting: General Surgery

## 2012-10-02 ENCOUNTER — Encounter (INDEPENDENT_AMBULATORY_CARE_PROVIDER_SITE_OTHER): Payer: Self-pay

## 2012-10-02 VITALS — BP 130/76 | HR 82 | Temp 98.0°F | Resp 18 | Ht 66.0 in | Wt 181.0 lb

## 2012-10-02 DIAGNOSIS — C20 Malignant neoplasm of rectum: Secondary | ICD-10-CM

## 2012-10-02 NOTE — Progress Notes (Signed)
HISTORY: The patient is a 47 year old female who is now around 3-4 months out from a laparoscopic assisted low anterior resection for T2 N0 rectal cancer. She had significant rectal pain and spasm postoperatively. She had spent significant amounts of time having bowel movements.  When this did not resolve, I took her back to the operating room for a dilation of her anastomosis and this improved her symptoms dramatically. She is not having rectal bleeding. She is able to eat. She is ready to back to work.  She is off narcotics and she is only taking an ativan 1-2x/week.    EXAM: General:  Alert and oriented  Incision:  Healing well.      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 She continues to have an issue with her hemorrhoid.  She is going to do the hydrocortisone suppositories and sitz baths. I advised her to avoid constipation and to continue her stool softeners. I will see her back in 6 months. She has an appointment for her scans and blood work as well as a followup appointment with oncology in 2-3 months.     Maudry Diego, MD Surgical Oncology, General & Endocrine Surgery [facility]  Rudi Heap, MD Ernestina Penna, MD

## 2012-10-02 NOTE — Assessment & Plan Note (Signed)
Patient's sense of urgency and rectal pain have resolved after dilation of the anastomosis. She continues to have an issue with her hemorrhoid.  She is going to do the hydrocortisone suppositories and sitz baths. I advised her to avoid constipation and to continue her stool softeners. I will see her back in 6 months. She has an appointment for her scans and blood work as well as a followup appointment with oncology in 2-3 months.

## 2012-10-02 NOTE — Patient Instructions (Signed)
Ok to go back to work  Follow up with me in 6 months.

## 2012-10-15 ENCOUNTER — Other Ambulatory Visit: Payer: Self-pay

## 2012-10-16 ENCOUNTER — Telehealth (INDEPENDENT_AMBULATORY_CARE_PROVIDER_SITE_OTHER): Payer: Self-pay | Admitting: General Surgery

## 2012-10-16 NOTE — Telephone Encounter (Signed)
Pt called to request Rx for [facility] suppositories, as discussed with Dr. Donell Beers at last office visit.  Paged and updated Dr. Donell Beers.  Ordered Anusol-HC suppositories (generic),  # 24, 1 PR BID prn, 2 RF and called same to pharmacist at [facility]:  [phone].

## 2012-10-17 NOTE — Telephone Encounter (Signed)
Dawn can you make her an appointment.  Thanks

## 2012-10-17 NOTE — Telephone Encounter (Signed)
Left message for pt to call back to set up appt

## 2012-10-23 NOTE — Telephone Encounter (Signed)
Left message for pt to call back to set up appt

## 2012-10-23 NOTE — Telephone Encounter (Signed)
Pt returned call and was confused to why we were calling her. I told her it was because she needed an OV to further her refills of anusol cream. She said she wasn't even using that any more and did not need OV. I told her to disregard the calls and she could call us when needed. Pt agreed

## 2012-11-02 ENCOUNTER — Telehealth (INDEPENDENT_AMBULATORY_CARE_PROVIDER_SITE_OTHER): Payer: Self-pay | Admitting: *Deleted

## 2012-11-02 NOTE — Telephone Encounter (Signed)
Patient requesting refill of Ativan 1mg  prescription.  Rae Halsted MD approval.

## 2012-11-04 NOTE — Telephone Encounter (Signed)
Ok to refill, #30

## 2012-11-05 ENCOUNTER — Telehealth (INDEPENDENT_AMBULATORY_CARE_PROVIDER_SITE_OTHER): Payer: Self-pay

## 2012-11-05 NOTE — Telephone Encounter (Signed)
Ativan 1mg  #30 w/ no refills called to [facility].  Pt is aware.

## 2012-11-09 ENCOUNTER — Ambulatory Visit (INDEPENDENT_AMBULATORY_CARE_PROVIDER_SITE_OTHER): Payer: Managed Care, Other (non HMO) | Admitting: Nurse Practitioner

## 2012-11-09 ENCOUNTER — Encounter: Payer: Self-pay | Admitting: Nurse Practitioner

## 2012-11-09 ENCOUNTER — Telehealth: Payer: Self-pay | Admitting: Nurse Practitioner

## 2012-11-09 VITALS — BP 106/72 | HR 66 | Temp 97.7°F | Ht 66.0 in | Wt 180.0 lb

## 2012-11-09 DIAGNOSIS — B009 Herpesviral infection, unspecified: Secondary | ICD-10-CM

## 2012-11-09 DIAGNOSIS — Z299 Encounter for prophylactic measures, unspecified: Secondary | ICD-10-CM

## 2012-11-09 MED ORDER — CIPROFLOXACIN HCL 500 MG PO TABS: 500.0000 mg | ORAL_TABLET | Freq: Two times a day (BID) | ORAL | Status: DC

## 2012-11-09 MED ORDER — FLUCONAZOLE 150 MG PO TABS: ORAL_TABLET | ORAL | Status: DC

## 2012-11-09 MED ORDER — VALACYCLOVIR HCL 1 G PO TABS: 1000.0000 mg | ORAL_TABLET | Freq: Three times a day (TID) | ORAL | Status: DC

## 2012-11-09 NOTE — Progress Notes (Signed)
  Subjective:    Patient ID: [name], female    DOB: 1966-05-30, 47 y.o.   MRN: [id number]  HPIPatient here C/O boil in perineal area. Noticed it 6 days ago. Has gotten larger. Painful to sut. Denies any drainage. 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   Cardiovascular: Negative.   Gastrointestinal: Positive for constipation (since colon surdery).       Objective:   Physical Exam  Constitutional: She appears well-developed and well-nourished.  Cardiovascular: Normal rate and normal heart sounds.   Pulmonary/Chest: Effort normal and breath sounds normal.  Genitourinary:     Vesicular lesion with induration left perineal area    BP 106/72  Pulse 66  Temp(Src) 97.7 F (36.5 C) (Oral)  Ht 5\' 6"  (1.676 m)  Wt 180 lb (81.647 kg)  BMI 29.07 kg/m2       Assessment & Plan:  1. HSV-2 infection Safe sex - valACYclovir (VALTREX) 1000 MG tablet; Take 1 tablet (1,000 mg total) by mouth 3 (three) times daily.  Dispense: 21 tablet; Refill: 0 - ciprofloxacin (CIPRO) 500 MG tablet; Take 1 tablet (500 mg total) by mouth 2 (two) times daily.  Dispense: 20 tablet; Refill: 0  2. Unspecified prophylactic or treatment measure  - fluconazole (DIFLUCAN) 150 MG tablet; 1 now and repeat in 1 week  Dispense: 2 tablet; Refill: 0  Mary-Margaret Daphine Deutscher, FNP

## 2012-11-09 NOTE — Telephone Encounter (Signed)
APPT MADE

## 2012-11-09 NOTE — Patient Instructions (Signed)
Herpes Simplex Herpes simplex is generally classified as Type 1 or Type 2. Type 1 is generally the type that is responsible for cold sores. Type 2 is generally associated with sexually transmitted diseases. We now know that most of the thoughts on these viruses are inaccurate. We find that HSV1 is also present genitally and HSV2 can be present orally, but this will vary in different locations of the world. Herpes simplex is usually detected by doing a culture. Blood tests are also available for this virus; however, the accuracy is often not as good.  PREPARATION FOR TEST No preparation or fasting is necessary. NORMAL FINDINGS  No virus present  No HSV antigens or antibodies present Ranges for normal findings may vary among different laboratories and hospitals. You should always check with your doctor after having lab work or other tests done to discuss the meaning of your test results and whether your values are considered within normal limits. MEANING OF TEST  Your caregiver will go over the test results with you and discuss the importance and meaning of your results, as well as treatment options and the need for additional tests if necessary. OBTAINING THE TEST RESULTS  It is your responsibility to obtain your test results. Ask the lab or department performing the test when and how you will get your results. Document Released: 09/03/2004 Document Revised: 10/24/2011 Document Reviewed: 07/12/2008 Exit[employer] Patient Information 2013 [employer].  

## 2012-11-19 ENCOUNTER — Ambulatory Visit
Admission: RE | Admit: 2012-11-19 | Discharge: 2012-11-19 | Disposition: A | Discharge status: Home / Self Care | Payer: Managed Care, Other (non HMO) | Source: Ambulatory Visit | Attending: Oncology | Admitting: Oncology

## 2012-11-19 ENCOUNTER — Other Ambulatory Visit (HOSPITAL_BASED_OUTPATIENT_CLINIC_OR_DEPARTMENT_OTHER): Payer: Managed Care, Other (non HMO) | Admitting: Lab

## 2012-11-19 DIAGNOSIS — C20 Malignant neoplasm of rectum: Secondary | ICD-10-CM

## 2012-11-19 LAB — COMPREHENSIVE METABOLIC PANEL (CC13)
ALT: 8 U/L (ref 0–55)
AST: 14 U/L (ref 5–34)
Albumin: 3.6 g/dL (ref 3.5–5.0)
CO2: 28 mEq/L (ref 22–29)
Calcium: 9.7 mg/dL (ref 8.4–10.4)
Chloride: 103 mEq/L (ref 98–107)
Creatinine: 0.7 mg/dL (ref 0.6–1.1)
Potassium: 3.9 mEq/L (ref 3.5–5.1)

## 2012-11-19 LAB — CBC WITH DIFFERENTIAL/PLATELET
BASO%: 0.4 % (ref 0.0–2.0)
Basophils Absolute: 0 10*3/uL (ref 0.0–0.1)
EOS%: 1.6 % (ref 0.0–7.0)
HCT: 44.6 % (ref 34.8–46.6)
HGB: 14.6 g/dL (ref 11.6–15.9)
MCH: 30.2 pg (ref 25.1–34.0)
MCHC: 32.9 g/dL (ref 31.5–36.0)
MONO#: 0.2 10*3/uL (ref 0.1–0.9)
NEUT%: 63.3 % (ref 38.4–76.8)
RDW: 12.7 % (ref 11.2–14.5)
WBC: 5.1 10*3/uL (ref 3.9–10.3)
lymph#: 1.5 10*3/uL (ref 0.9–3.3)

## 2012-11-19 LAB — CEA: CEA: 0.8 ng/mL (ref 0.0–5.0)

## 2012-11-19 MED ORDER — IOHEXOL 300 MG/ML  SOLN
100.0000 mL | Freq: Once | INTRAMUSCULAR | Status: AC | PRN
  Administered 2012-11-19: 100 mL via INTRAVENOUS

## 2012-11-22 ENCOUNTER — Other Ambulatory Visit: Payer: Managed Care, Other (non HMO) | Admitting: Lab

## 2012-11-23 ENCOUNTER — Ambulatory Visit: Payer: Managed Care, Other (non HMO) | Admitting: Oncology

## 2012-11-28 ENCOUNTER — Encounter (INDEPENDENT_AMBULATORY_CARE_PROVIDER_SITE_OTHER): Payer: Self-pay

## 2012-11-30 ENCOUNTER — Telehealth: Payer: Self-pay | Admitting: Oncology

## 2012-11-30 ENCOUNTER — Ambulatory Visit (HOSPITAL_BASED_OUTPATIENT_CLINIC_OR_DEPARTMENT_OTHER): Payer: Managed Care, Other (non HMO) | Admitting: Oncology

## 2012-11-30 VITALS — BP 108/74 | HR 77 | Temp 97.0°F | Resp 20 | Ht 66.0 in | Wt 182.3 lb

## 2012-11-30 DIAGNOSIS — K649 Unspecified hemorrhoids: Secondary | ICD-10-CM

## 2012-11-30 DIAGNOSIS — C20 Malignant neoplasm of rectum: Secondary | ICD-10-CM

## 2012-11-30 NOTE — Telephone Encounter (Signed)
gv pt appt schedule for October and ct @ [facility] [address] 10/10. Pt has instructions and prep.

## 2012-11-30 NOTE — Progress Notes (Signed)
Hematology and Oncology Follow Up Visit  [name] [id number] 1965-11-09 47 y.o. 11/30/2012 10:29 AM   Principle Diagnosis: 47 year old diagnosed with T2N0 rectal cancer in 05/2012. Her tumor was 7-10 cm from the anal verge.    Prior Therapy: Patient is S/P laparoscopic LAR done on 07/02/2012. The pathology reveled: INVASIVE MODERATELY DIFFERENTIATED ADENOCARCINOMA INVADING INTO BUT NOTTHROUGH THE MUSCULARIS PROPRIA. - NO ANGIOLYMPHATIC INVASION IDENTIFIED. - SIXTEEN LYMPH NODES WITH FOCAL ENDOSALPINGIOSIS, NO EVIDENCE OF METASTATIC CARCINOMA(0/16). Her tumor is Microsatellite stable.   Current therapy: Observation and follow up.   Interim History: [name] presents for a follow up visit. She is a nice women with the above diagnosis. She underwent an LAR on on 07/02/2012. She is recovering better at this time. She still reports constipation and sensation of fullness in her rectal area. No bleeding noted.  No other pain or discomfort. Weight has been stable and has been able to drive. She reports constipation as well.   Medications: I have reviewed the patient's current medications. Current outpatient prescriptions:dibucaine (NUPERCAINAL) 1 % ointment, Apply topically 3 (three) times daily as needed for pain., Disp: 30 g, Rfl: 0;  docusate sodium (COLACE) 100 MG capsule, Take 100 mg by mouth 2 (two) times daily as needed., Disp: , Rfl: ;  hydrocortisone (ANUSOL-HC) 25 MG suppository, Place 25 mg rectally every 6 (six) hours as needed., Disp: , Rfl:  LORazepam (ATIVAN) 1 MG tablet, Take 1 tablet (1 mg total) by mouth every 8 (eight) hours as needed., Disp: 90 tablet, Rfl: 1;  polyethylene glycol (MIRALAX / GLYCOLAX) packet, Take 17 g by mouth daily., Disp: 30 each, Rfl: 1;  hydrocortisone (ANUSOL-HC) 2.5 % rectal cream, Place rectally 2 (two) times daily., Disp: 30 g, Rfl: 0  Allergies:  Allergies  Allergen Reactions  . Codeine Nausea Only    Confirmed intolerance of codeine  verbally with pt in PACU, pt states does not cause any SOB or dyspnea, some nausea only (MN-RN)  . Ultram (Tramadol) Nausea And Vomiting    Ultram allergy also discussed, active N&V occurs with Ultram  . Latex Itching and Rash    Past Medical History, Surgical history, Social history, and Family History were reviewed and updated.  Review of Systems: Constitutional:  Negative for fever, chills, night sweats, anorexia, weight loss, pain. Cardiovascular: no chest pain or dyspnea on exertion Respiratory: no cough, shortness of breath, or wheezing Neurological: negative Dermatological: negative ENT: negative Skin: Negative. Gastrointestinal: negative Genito-Urinary: no dysuria, trouble voiding, or hematuria Hematological and Lymphatic: negative Breast: negative for breast lumps Musculoskeletal: negative Remaining ROS negative. Physical Exam: Blood pressure 108/74, pulse 77, temperature 97 F (36.1 C), temperature source Oral, resp. rate 20, height 5\' 6"  (1.676 m), weight 182 lb 4.8 oz (82.691 kg). ECOG: 1 General appearance: alert Head: Normocephalic, without obvious abnormality, atraumatic Neck: no adenopathy, no carotid bruit, no JVD, supple, symmetrical, trachea midline and thyroid not enlarged, symmetric, no tenderness/mass/nodules Lymph nodes: Cervical, supraclavicular, and axillary nodes normal. Heart:regular rate and rhythm, S1, S2 normal, no murmur, click, rub or gallop Lung:chest clear, no wheezing, rales, normal symmetric air entry Abdomin: soft, non-tender, without masses or organomegaly EXT:no erythema, induration, or nodules   Lab Results: Lab Results  Component Value Date   WBC 5.1 11/19/2012   HGB 14.6 11/19/2012   HCT 44.6 11/19/2012   MCV 91.8 11/19/2012   PLT 200 11/19/2012     Chemistry      Component Value Date/Time   NA 140 11/19/2012  1005   NA 137 07/04/2012 0421   K 3.9 11/19/2012 1005   K 3.9 07/04/2012 0421   CL 103 11/19/2012 1005   CL 107 07/04/2012 0421    CO2 28 11/19/2012 1005   CO2 25 07/04/2012 0421   BUN 6.8* 11/19/2012 1005   BUN 7 07/04/2012 0421   CREATININE 0.7 11/19/2012 1005   CREATININE 0.74 07/04/2012 0421      Component Value Date/Time   CALCIUM 9.7 11/19/2012 1005   CALCIUM 8.3* 07/04/2012 0421   ALKPHOS 53 11/19/2012 1005   ALKPHOS 48 06/01/2012 1452   AST 14 11/19/2012 1005   AST 16 06/01/2012 1452   ALT 8 11/19/2012 1005   ALT 11 06/01/2012 1452   BILITOT 0.35 11/19/2012 1005   BILITOT 0.4 06/01/2012 1452     CT CHEST  Findings: No evidence of hilar or mediastinal masses. No  lymphadenopathy seen elsewhere within the thorax. No evidence of  pleural or pericardial effusion. No suspicious pulmonary nodules  or masses identified. No evidence of pulmonary infiltrate or  central endobronchial lesion. No evidence of chest wall mass or  suspicious bone lesions.  IMPRESSION:  Negative. No evidence of metastatic disease or other acute  findings within the thorax.  CT ABDOMEN AND PELVIS  Findings: A persistent small approximately 10 mm low attenuation  lesion is seen in the dome of the right hepatic lobe on image 45.  This is too small to characterize, but appears to have been present  on previous noncontrast CT performed at [facility] on  03/27/2011 suggesting a benign etiology. No other liver lesions  are identified.  The gallbladder, pancreas, spleen, adrenal glands, and kidneys are  normal appearance. No evidence of hydronephrosis. No  lymphadenopathy identified within the abdomen or pelvis.  Prior hysterectomy noted. Adnexal regions are unremarkable. No  evidence of inflammatory process or abnormal fluid collections.  Mild diverticulosis is seen promptly involving the low distal  descending colon, however there is no evidence of diverticulitis.  No evidence of bowel wall thickening, dilatation, or hernia. No  suspicious bone lesions identified.  IMPRESSION:  1. 1 cm low attenuation lesion in the dome of the right  hepatic  lobe, which is too small to characterize. This was probably  present on previous noncontrast study in 2012, suggesting a benign  etiology. Recommend continued follow-up by CT, or alternatively,  abdomen MRI without with contrast could be performed for further  characterization.  2. No other signs of metastatic disease identified.  3. Diverticulosis. No radiographic evidence of diverticulitis.    Impression and Plan:  47 year old with the following issues:  1. T2N0 rectal cancer diagnosed in 05/2012. She is S/P LAR in 06/2012. CT scan and labs from 4/7 discussed today and do not show any cancer relapse.  For now, we will continue active follow up and repeat scans and labs in 6 months.    2. Colonoscopic screen: She will be due in 06/2013.   3. Hemorrhoids: she is using over the counter preparations and not interested in surgery for now.      [facility], MD 4/18/201410:29 AM

## 2013-03-15 ENCOUNTER — Ambulatory Visit (INDEPENDENT_AMBULATORY_CARE_PROVIDER_SITE_OTHER): Payer: Managed Care, Other (non HMO) | Admitting: General Surgery

## 2013-03-15 ENCOUNTER — Encounter (INDEPENDENT_AMBULATORY_CARE_PROVIDER_SITE_OTHER): Payer: Self-pay | Admitting: General Surgery

## 2013-03-15 VITALS — BP 126/68 | HR 68 | Temp 98.2°F | Resp 15 | Ht 66.5 in | Wt 186.6 lb

## 2013-03-15 DIAGNOSIS — F411 Generalized anxiety disorder: Secondary | ICD-10-CM | POA: Insufficient documentation

## 2013-03-15 DIAGNOSIS — K5900 Constipation, unspecified: Secondary | ICD-10-CM

## 2013-03-15 DIAGNOSIS — K644 Residual hemorrhoidal skin tags: Secondary | ICD-10-CM

## 2013-03-15 DIAGNOSIS — C20 Malignant neoplasm of rectum: Secondary | ICD-10-CM

## 2013-03-15 DIAGNOSIS — K648 Other hemorrhoids: Secondary | ICD-10-CM

## 2013-03-15 MED ORDER — LINACLOTIDE 145 MCG PO CAPS: 145.0000 ug | ORAL_CAPSULE | Freq: Every day | ORAL | Status: DC

## 2013-03-15 MED ORDER — LORAZEPAM 1 MG PO TABS: 1.0000 mg | ORAL_TABLET | Freq: Three times a day (TID) | ORAL | Status: DC

## 2013-03-15 MED ORDER — HYDROCORTISONE 2.5 % RE CREA: TOPICAL_CREAM | Freq: Two times a day (BID) | RECTAL | Status: DC

## 2013-03-15 NOTE — Patient Instructions (Signed)
Take laxatives as needed if linzess does not work for constipation.    Use hemorrhoid cream 2-3 times/day.

## 2013-03-15 NOTE — Assessment & Plan Note (Signed)
Pt continues to have constipation. Will see if we can move up colonoscopy to evaluate the anastamosis better and to clear the colon of other lesions.    Follow up in 6 months.

## 2013-03-15 NOTE — Progress Notes (Signed)
HISTORY: The patient is a 47 year old female who is now around 9-10 months out from a laparoscopic assisted low anterior resection for T2 N0 rectal cancer. She had significant rectal pain and spasm postoperatively. She had spent significant amounts of time having bowel movements.  When this did not resolve, I took her back to the operating room for a dilation of her anastomosis and this improved her symptoms dramatically. She has had worsening of her symptoms of constipation, and now feels like her GI tract is ruling her life.  She spends LOTS of time on the toilet.  She is having issues figuring out what to take to help.  Sometimes she goes around 1 week before having BM.  Then, it is sometimes just small "marbles." She is taking benefiber and stool softeners.  She tried linzess samples, and had good luck with those.  The miralax quit working for her.  She continues to have LLQ soreness and some numbness around the incision.     EXAM: General:  Alert and oriented  CV: Reg rate and rhythm. Abd:  Soft, non distended, non tender.   Incision:  Healing well.      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 Will see if we can move up colonoscopy to evaluate the anastamosis better and to clear the colon of other lesions.    Follow up in 6 months.  Internal and external prolapsed hemorrhoids Add back anusol cream for external hemorrhoid.  Internal hemorrhoid injected.   Avoid constipation.    Constipation Pt had good luck with Linzess from Ob/Gyn Will refill prescription and have follow up with GI.    Anxiety state, unspecified Refill Ativan 30 pills only.      Maudry Diego, MD Surgical Oncology, General & Endocrine Surgery [facility]  Rudi Heap, MD Ernestina Penna, MD

## 2013-03-15 NOTE — Assessment & Plan Note (Signed)
Add back anusol cream for external hemorrhoid.  Internal hemorrhoid injected.   Avoid constipation.

## 2013-03-15 NOTE — Assessment & Plan Note (Signed)
Pt had good luck with Linzess from Ob/Gyn Will refill prescription and have follow up with GI.

## 2013-03-15 NOTE — Assessment & Plan Note (Signed)
Refill Ativan 30 pills only.

## 2013-03-19 ENCOUNTER — Telehealth: Payer: Self-pay | Admitting: Gastroenterology

## 2013-03-19 ENCOUNTER — Other Ambulatory Visit: Payer: Self-pay | Admitting: Gastroenterology

## 2013-03-19 DIAGNOSIS — K5900 Constipation, unspecified: Secondary | ICD-10-CM

## 2013-03-19 NOTE — Telephone Encounter (Signed)
Called patient TO DISCUSS CONCERNS. LVM-CALL [phone] TO DISCUSS MANAGEMENT OF CONSTIPATION. FAVOR FLEX SIG TO EVALUATE ANASTOMOSIS.

## 2013-03-19 NOTE — Telephone Encounter (Signed)
PREPARE INFO AND CALL PT TO PICK UP INSTRUCTIONS.

## 2013-03-19 NOTE — Telephone Encounter (Signed)
Patient is scheduled for Aug 8th w/SLF at 2:00 and I have instructions ready for her to pick up

## 2013-03-19 NOTE — Telephone Encounter (Signed)
Called patient. Explained she needs a flex sig. She reports small caliber stools and extreme difficulty passing stools. FLEX SIG FRI AUG 8 AT 145 PM. PT INSTRUCTED TO BE IN ENDO AT 1245 PM. PHENERGAN 12.5 MG IV IN PREOP. CLEAR LIQUID BREAKFAST. NPO EXCEPT MEDS AFTER 9 AM ON AUG 8. USE ONE ENEMA AT 9 AM.

## 2013-03-20 ENCOUNTER — Encounter (HOSPITAL_COMMUNITY): Payer: Self-pay | Admitting: Pharmacy Technician

## 2013-03-20 NOTE — Progress Notes (Signed)
REVIEWED.  

## 2013-03-22 ENCOUNTER — Encounter (HOSPITAL_COMMUNITY)
Admission: RE | Disposition: A | Discharge status: Home / Self Care | Payer: Self-pay | Source: Ambulatory Visit | Attending: Gastroenterology

## 2013-03-22 ENCOUNTER — Encounter (HOSPITAL_COMMUNITY): Payer: Self-pay | Admitting: *Deleted

## 2013-03-22 ENCOUNTER — Ambulatory Visit (HOSPITAL_COMMUNITY)
Admission: RE | Admit: 2013-03-22 | Discharge: 2013-03-22 | Disposition: A | Discharge status: Home / Self Care | Payer: Managed Care, Other (non HMO) | Source: Ambulatory Visit | Attending: Gastroenterology | Admitting: Gastroenterology

## 2013-03-22 DIAGNOSIS — D126 Benign neoplasm of colon, unspecified: Secondary | ICD-10-CM | POA: Insufficient documentation

## 2013-03-22 DIAGNOSIS — Z9049 Acquired absence of other specified parts of digestive tract: Secondary | ICD-10-CM | POA: Insufficient documentation

## 2013-03-22 DIAGNOSIS — R1032 Left lower quadrant pain: Secondary | ICD-10-CM

## 2013-03-22 DIAGNOSIS — K5900 Constipation, unspecified: Secondary | ICD-10-CM

## 2013-03-22 DIAGNOSIS — R198 Other specified symptoms and signs involving the digestive system and abdomen: Secondary | ICD-10-CM

## 2013-03-22 DIAGNOSIS — K625 Hemorrhage of anus and rectum: Secondary | ICD-10-CM | POA: Insufficient documentation

## 2013-03-22 HISTORY — PX: FLEXIBLE SIGMOIDOSCOPY: SHX5431

## 2013-03-22 SURGERY — SIGMOIDOSCOPY, FLEXIBLE
Anesthesia: Moderate Sedation

## 2013-03-22 MED ORDER — SIMETHICONE 40 MG/0.6ML PO SUSP
ORAL | Status: DC | PRN
  Administered 2013-03-22: 14:00:00

## 2013-03-22 MED ORDER — SODIUM CHLORIDE 0.9 % IV SOLN
INTRAVENOUS | Status: DC
  Administered 2013-03-22: 14:00:00 via INTRAVENOUS

## 2013-03-22 MED ORDER — SODIUM CHLORIDE 0.9 % IJ SOLN
INTRAMUSCULAR | Status: AC
  Filled 2013-03-22: qty 10

## 2013-03-22 MED ORDER — PROMETHAZINE HCL 25 MG/ML IJ SOLN
INTRAMUSCULAR | Status: AC
  Filled 2013-03-22: qty 1

## 2013-03-22 MED ORDER — PROMETHAZINE HCL 25 MG/ML IJ SOLN: 25.0000 mg | Freq: Once | INTRAMUSCULAR | Status: DC

## 2013-03-22 MED ORDER — MEPERIDINE HCL 100 MG/ML IJ SOLN
INTRAMUSCULAR | Status: AC
  Filled 2013-03-22: qty 2

## 2013-03-22 MED ORDER — PROMETHAZINE HCL 25 MG/ML IJ SOLN
12.5000 mg | Freq: Four times a day (QID) | INTRAMUSCULAR | Status: AC | PRN
  Administered 2013-03-22: 14:00:00 via INTRAVENOUS

## 2013-03-22 MED ORDER — MIDAZOLAM HCL 5 MG/5ML IJ SOLN
INTRAMUSCULAR | Status: AC
  Filled 2013-03-22: qty 10

## 2013-03-22 MED ORDER — MEPERIDINE HCL 100 MG/ML IJ SOLN
INTRAMUSCULAR | Status: DC | PRN
  Administered 2013-03-22 (×2): 25 mg via INTRAVENOUS

## 2013-03-22 MED ORDER — MIDAZOLAM HCL 5 MG/5ML IJ SOLN
INTRAMUSCULAR | Status: DC | PRN
  Administered 2013-03-22 (×2): 2 mg via INTRAVENOUS

## 2013-03-22 NOTE — H&P (Signed)
Primary Care Physician:  Rudi Heap, MD Primary Gastroenterologist:  Dr. Darrick Penna  Pre-Procedure History & Physical: HPI:  [name] is a 47 y.o. female here for Change in bowel habits  Past Medical History  Diagnosis Date  . Blood transfusion 1995  . Kidney stone   . Constipation     SOME BLOOD IN STOOL  . Anxiety     NEW-DUE TO ANXIETY OVER DX OF CANCER  . GERD (gastroesophageal reflux disease)     occasionally-NO MEDS  . Cancer     RECTAL CANCER  . PONV (postoperative nausea and vomiting)     used a scop patch last surgery-was better    Past Surgical History  Procedure Laterality Date  . Abdominal hysterectomy  1995    partial  . Left ankle surgery for fx  several yrs ago  . Endoscopic left fallopian tube removed  several yrs ago  . Excision/release bursa hip  09/15/2011    Dr Shelle Iron EXCISION/RELEASE BURSA HIP;  Surgeon: Javier Docker, MD;  Location: [facility];  Service: Orthopedics;  Laterality: Left;  Excision of Trochanteric Bursitis  . Colonoscopy  06/01/2012    Procedure: COLONOSCOPY;  Surgeon: West Bali, MD;  Location: [facility];  Service: Endoscopy;  Laterality: N/A;  1:30PM  . Eus  06/06/2012    Procedure: LOWER ENDOSCOPIC ULTRASOUND (EUS);  Surgeon: Willis Modena, MD;  Location: [facility];  Service: Endoscopy;  Laterality: N/A;  . Laparoscopic low anterior resection  07/02/2012    Procedure: LAPAROSCOPIC LOW ANTERIOR RESECTION;  Surgeon: Almond Lint, MD;  Location: [facility];  Service: General;  Laterality: N/A;  . Colon surgery    . Examination under anesthesia  09/07/2012    Procedure: EXAM UNDER ANESTHESIA;  Surgeon: Almond Lint, MD;  Location: [facility];  Service: General;  Laterality: N/A;  . Proctoscopy  09/07/2012    Procedure: PROCTOSCOPY;  Surgeon: Almond Lint, MD;  Location: [facility];  Service: General;  Laterality: N/A;    Prior to Admission medications   Medication Sig Start Date End Date Taking?  Authorizing Provider  docusate sodium (COLACE) 100 MG capsule Take 100 mg by mouth 2 (two) times daily as needed.   Yes Historical Provider, MD  estradiol (ESTRACE) 0.5 MG tablet Take 0.5 mg by mouth daily.  03/11/13  Yes Historical Provider, MD  hydrocortisone (ANUSOL-HC) 2.5 % rectal cream Place rectally 2 (two) times daily. Apply around anus for irritated & painful hemorrhoids 03/15/13  Yes Almond Lint, MD  Linaclotide [facility]) 145 MCG CAPS Take 1 capsule (145 mcg total) by mouth daily. 03/15/13  Yes Almond Lint, MD  LORazepam (ATIVAN) 1 MG tablet Take 1 tablet (1 mg total) by mouth every 8 (eight) hours. 03/15/13  Yes Almond Lint, MD  Wheat Dextrin (BENEFIBER DRINK MIX PO) Take 1 Can by mouth daily.    Yes Historical Provider, MD    Allergies as of 03/19/2013 - Review Complete 03/19/2013  Allergen Reaction Noted  . Codeine Nausea Only 12/15/2010  . Ultram (tramadol) Nausea And Vomiting 05/25/2012  . Latex Itching and Rash 07/03/2012    Family History  Problem Relation Age of Onset  . Colon cancer Neg Hx   . Other Sister     History   Social History  . Marital Status: Single    Spouse Name: N/A    Number of Children: 2  . Years of Education: N/A   Occupational History  . primary operator [employer]  Social History Main Topics  . Smoking status: Never Smoker   . Smokeless tobacco: Never Used  . Alcohol Use: Yes     Comment: occasional couple times per mo  . Drug Use: No  . Sexually Active: Yes    Birth Control/ Protection: Surgical   Other Topics Concern  . Not on file   Social History Narrative   Lives alone & 2 kids          Review of Systems: See HPI, otherwise negative ROS   Physical Exam: BP 118/82  Pulse 79  Temp(Src) 98 F (36.7 C) (Oral)  Resp 15 General:   Alert,  pleasant and cooperative in NAD Head:  Normocephalic and atraumatic. Neck:  Supple; Lungs:  Clear throughout to auscultation.    Heart:  Regular rate and rhythm. Abdomen:   Soft, nontender and nondistended. Normal bowel sounds, without guarding, and without rebound.   Neurologic:  Alert and  oriented x4;  grossly normal neurologically.  Impression/Plan:       PLAN:  1. FLEX SIG TODAY

## 2013-03-22 NOTE — Op Note (Signed)
[facility] [address] [place], [address]   FLEX SIGMOIDOSCOPY PROCEDURE REPORT  PATIENT: Lisa, [name]  MR#: [id number] BIRTHDATE: 1966-03-01 , 47  yrs. old GENDER: Female ENDOSCOPIST: Jonette Eva, MD REFERRED XB:JYNWG Donell Beers, M.D.  Rudi Heap, M.D. PROCEDURE DATE:  03/22/2013 PROCEDURE:   Sigmoidoscopy with snare INDICATIONS:change in bowel habits.-difficulty with PASSING STOOL/RECTAL URGENCY.  occasional rectal bleeding/LLQ ABD PAIN. LINZESS HELPS, BUT TAKES QOD. MEDICATIONS: Promethazine (Phenergan) 12.5mg  IV, Demerol 50 mg IV, and Versed 4 mg IV  DESCRIPTION OF PROCEDURE:    Physical exam was performed.  Informed consent was obtained from the patient after explaining the benefits, risks, and alternatives to procedure.  The patient was connected to monitor and placed in left lateral position. Continuous oxygen was provided by nasal cannula and IV medicine administered through an indwelling cannula.  After administration of sedation and rectal exam, the patients rectum was intubated and the EC-3890Li ([id number]), EG-2990i ([id number]), and EC-3890Li ([id number])  colonoscope was advanced under direct visualization to the DESCENDING COLON  The scope was removed slowly by carefully examining the color, texture, anatomy, and integrity mucosa on the way out.  The patient was recovered in endoscopy and discharged home in satisfactory condition.     COLON FINDINGS: There was evidence of a prior surgical anastomosis with visible sutures.  ,POLYPOID LESION FRIABLE. THE POLYPOID LESION  measurED 6 mm in size.  A polypectomy was performed using snare cautery.  UNABLE TO APPRECIATE STENOSIS OF ANASTOMOSIS BY RECTALOR ENDOSCOPI EXAM.  NO MASS.  PREP QUALITY: gooD      COMPLICATIONS: None  ENDOSCOPIC IMPRESSION: 1.   Prior surgical anastomosis 2.   POLYPOID LESION AT THE ANASTOMOSIS 3.   UNABLE TO APPRECIATE STENOSIS OF ANASTOMOSIS. DIFFICULTY PASSING STOOL IS  MOST LIKELY DUE TO PELVIC FLOOR DYSFUNCTION.   RECOMMENDATIONS: ALTERNATE MIRALAX WITH LINZESS. CONTINUE COLACE. DRINK WATER TO KEEP URINE LIGHT YELLOW. FOLLOW A HIGH FIBER DIET.  CONTINUE BENEFIBER. BIOPSY WILL BE BACK IN 7 DAYS. WILL CALL DR.  BYERLY NEXT WEEK.  MAY NEED ARM/BALLOON EXPULSION TEST       _______________________________ Rosalie DoctorJonette Eva, MD 03/22/2013 4:15 PM     PATIENT NAME:  Lisa, [name] MR#: [id number]

## 2013-03-25 ENCOUNTER — Other Ambulatory Visit (INDEPENDENT_AMBULATORY_CARE_PROVIDER_SITE_OTHER): Payer: Self-pay | Admitting: General Surgery

## 2013-03-25 DIAGNOSIS — C20 Malignant neoplasm of rectum: Secondary | ICD-10-CM

## 2013-03-26 ENCOUNTER — Encounter (HOSPITAL_COMMUNITY): Payer: Self-pay | Admitting: Gastroenterology

## 2013-04-01 ENCOUNTER — Telehealth: Payer: Self-pay

## 2013-04-01 NOTE — Telephone Encounter (Signed)
Called patient TO DISCUSS RESULTS. PT HAD GRANULATION TISSUE REMOVED. DIFFICULTY HAVING BMs MOST LIKELY DUE TO PELVIC FLOOR DYSFUNCTION/WEAKNESS. TRIED LINZESS 290 AND COULDN'T CONTROL HER BOWELS. I CALLED DR. BYERLY. DISCUSSED WITH DR. BYERLY LAST WEEK.    CONTINUE LINZESS, & COLACE. DRINK WATER TO KEEP YOUR URINE LIGHT YELLOW. FOLLOW A HIGH FIBER DIET.   PT AWARE THAT REFERRAL CAN BE MADE TO PELVIC FLOOR REHAB. DECLINES REFERRAL FOR NOW. WILL CALL IF SHE CHANGES HER MIND.  TCS OCT 2014 PREPOPIK. DX: COLON CA

## 2013-04-01 NOTE — Telephone Encounter (Signed)
Pt left VM asking for results.

## 2013-04-09 ENCOUNTER — Ambulatory Visit: Payer: Managed Care, Other (non HMO) | Admitting: Physical Therapy

## 2013-05-22 ENCOUNTER — Encounter (INDEPENDENT_AMBULATORY_CARE_PROVIDER_SITE_OTHER): Payer: Self-pay

## 2013-05-22 ENCOUNTER — Ambulatory Visit (INDEPENDENT_AMBULATORY_CARE_PROVIDER_SITE_OTHER): Payer: Managed Care, Other (non HMO) | Admitting: General Practice

## 2013-05-22 ENCOUNTER — Encounter: Payer: Self-pay | Admitting: General Practice

## 2013-05-22 ENCOUNTER — Telehealth: Payer: Self-pay | Admitting: Nurse Practitioner

## 2013-05-22 VITALS — BP 125/80 | HR 66 | Temp 97.3°F | Ht 66.5 in | Wt 188.5 lb

## 2013-05-22 DIAGNOSIS — R42 Dizziness and giddiness: Secondary | ICD-10-CM

## 2013-05-22 MED ORDER — MECLIZINE HCL 25 MG PO TABS: 25.0000 mg | ORAL_TABLET | Freq: Three times a day (TID) | ORAL | Status: DC | PRN

## 2013-05-22 NOTE — Telephone Encounter (Signed)
appt made

## 2013-05-22 NOTE — Progress Notes (Signed)
  Subjective:    Patient ID: [name], female    DOB: [birth year], 47 y.o.   MRN: [id number]  HPI Patient presents today with complaints of dizziness. She reports the onset was at 4pm on Tuesday while folding laundry. She reports dizziness worsens with sudden change in positions. She denies any recent illnesses or medication changes. Denies history of dizziness.     Review of Systems  Constitutional: Negative for fever and chills.  HENT: Positive for ear pain. Negative for hearing loss, postnasal drip, rhinorrhea, sinus pressure, sneezing and sore throat.        Right ear pain  Eyes: Negative for pain, discharge and itching.  Respiratory: Negative for chest tightness and shortness of breath.   Cardiovascular: Negative for chest pain and palpitations.  Gastrointestinal: Negative for nausea and vomiting.  Neurological: Positive for dizziness. Negative for weakness and headaches.       Objective:   Physical Exam  Constitutional: She is oriented to person, place, and time. She appears well-developed and well-nourished.  HENT:  Head: Normocephalic and atraumatic.  Right Ear: External ear normal.  Left Ear: External ear normal.  Nose: Nose normal.  Mouth/Throat: Oropharynx is clear and moist.  Eyes: EOM are normal. Pupils are equal, round, and reactive to light.  Neck: Normal range of motion. Neck supple. No thyromegaly present.  Cardiovascular: Normal rate, regular rhythm and normal heart sounds.   Pulmonary/Chest: Effort normal and breath sounds normal. No respiratory distress. She exhibits no tenderness.  Lymphadenopathy:    She has no cervical adenopathy.  Neurological: She is alert and oriented to person, place, and time. She has normal strength. She displays a negative Romberg sign.  Patient displays unsteadiness upon standing  Skin: Skin is warm and dry.  Psychiatric: She has a normal mood and affect.          Assessment & Plan:  1. Vertigo - meclizine (ANTIVERT)  25 MG tablet; Take 1 tablet (25 mg total) by mouth 3 (three) times daily as needed.  Dispense: 30 tablet; Refill: 1 -sedation precautions discussed -safety precautions discussed -information sheet provided and discussed -RTO if symptoms worsen or unresolved -Patient verbalized understanding Coralie Keens, FNP-C

## 2013-05-22 NOTE — Patient Instructions (Signed)
Vertigo Vertigo means you feel like you or your surroundings are moving when they are not. Vertigo can be dangerous if it occurs when you are at work, driving, or performing difficult activities.  CAUSES  Vertigo occurs when there is a conflict of signals sent to your brain from the visual and sensory systems in your body. There are many different causes of vertigo, including:  Infections, especially in the inner ear.  A bad reaction to a drug or misuse of alcohol and medicines.  Withdrawal from drugs or alcohol.  Rapidly changing positions, such as lying down or rolling over in bed.  A migraine headache.  Decreased blood flow to the brain.  Increased pressure in the brain from a head injury, infection, tumor, or bleeding. SYMPTOMS  You may feel as though the world is spinning around or you are falling to the ground. Because your balance is upset, vertigo can cause nausea and vomiting. You may have involuntary eye movements (nystagmus). DIAGNOSIS  Vertigo is usually diagnosed by physical exam. If the cause of your vertigo is unknown, your caregiver may perform imaging tests, such as an MRI scan (magnetic resonance imaging). TREATMENT  Most cases of vertigo resolve on their own, without treatment. Depending on the cause, your caregiver may prescribe certain medicines. If your vertigo is related to body position issues, your caregiver may recommend movements or procedures to correct the problem. In rare cases, if your vertigo is caused by certain inner ear problems, you may need surgery. HOME CARE INSTRUCTIONS   Follow your caregiver's instructions.  Avoid driving.  Avoid operating heavy machinery.  Avoid performing any tasks that would be dangerous to you or others during a vertigo episode.  Tell your caregiver if you notice that certain medicines seem to be causing your vertigo. Some of the medicines used to treat vertigo episodes can actually make them worse in some people. SEEK  IMMEDIATE MEDICAL CARE IF:   Your medicines do not relieve your vertigo or are making it worse.  You develop problems with talking, walking, weakness, or using your arms, hands, or legs.  You develop severe headaches.  Your nausea or vomiting continues or gets worse.  You develop visual changes.  A family member notices behavioral changes.  Your condition gets worse. MAKE SURE YOU:  Understand these instructions.  Will watch your condition.  Will get help right away if you are not doing well or get worse. Document Released: 05/11/2005 Document Revised: 10/24/2011 Document Reviewed: 02/17/2011 ExitCare Patient Information 2014 [employer].  

## 2013-05-24 ENCOUNTER — Ambulatory Visit
Admission: RE | Admit: 2013-05-24 | Discharge: 2013-05-24 | Disposition: A | Discharge status: Home / Self Care | Payer: Managed Care, Other (non HMO) | Source: Ambulatory Visit | Attending: Oncology | Admitting: Oncology

## 2013-05-24 ENCOUNTER — Other Ambulatory Visit: Payer: Self-pay | Admitting: Oncology

## 2013-05-24 ENCOUNTER — Other Ambulatory Visit (HOSPITAL_BASED_OUTPATIENT_CLINIC_OR_DEPARTMENT_OTHER): Payer: Managed Care, Other (non HMO) | Admitting: Lab

## 2013-05-24 ENCOUNTER — Ambulatory Visit: Admission: RE | Admit: 2013-05-24 | Payer: Managed Care, Other (non HMO) | Source: Ambulatory Visit

## 2013-05-24 ENCOUNTER — Ambulatory Visit: Payer: Managed Care, Other (non HMO)

## 2013-05-24 DIAGNOSIS — C189 Malignant neoplasm of colon, unspecified: Secondary | ICD-10-CM

## 2013-05-24 DIAGNOSIS — C20 Malignant neoplasm of rectum: Secondary | ICD-10-CM

## 2013-05-24 LAB — CBC WITH DIFFERENTIAL/PLATELET
Eosinophils Absolute: 0.1 10*3/uL (ref 0.0–0.5)
LYMPH%: 38.5 % (ref 14.0–49.7)
MONO#: 0.2 10*3/uL (ref 0.1–0.9)
NEUT#: 2.5 10*3/uL (ref 1.5–6.5)
Platelets: 183 10*3/uL (ref 145–400)
RBC: 4.64 10*6/uL (ref 3.70–5.45)
WBC: 4.6 10*3/uL (ref 3.9–10.3)

## 2013-05-24 LAB — COMPREHENSIVE METABOLIC PANEL (CC13)
Albumin: 3.5 g/dL (ref 3.5–5.0)
Anion Gap: 7 mEq/L (ref 3–11)
CO2: 28 mEq/L (ref 22–29)
Calcium: 9.4 mg/dL (ref 8.4–10.4)
Chloride: 106 mEq/L (ref 98–109)
Glucose: 88 mg/dl (ref 70–140)
Potassium: 4.4 mEq/L (ref 3.5–5.1)
Sodium: 141 mEq/L (ref 136–145)
Total Bilirubin: 0.31 mg/dL (ref 0.20–1.20)
Total Protein: 7.3 g/dL (ref 6.4–8.3)

## 2013-05-24 MED ORDER — IOHEXOL 300 MG/ML  SOLN
100.0000 mL | Freq: Once | INTRAMUSCULAR | Status: AC | PRN
  Administered 2013-05-24: 100 mL via INTRAVENOUS

## 2013-05-25 LAB — CEA: CEA: 0.9 ng/mL (ref 0.0–5.0)

## 2013-05-30 ENCOUNTER — Telehealth: Payer: Self-pay

## 2013-05-31 ENCOUNTER — Telehealth: Payer: Self-pay | Admitting: Oncology

## 2013-05-31 ENCOUNTER — Encounter (HOSPITAL_COMMUNITY): Payer: Self-pay

## 2013-05-31 ENCOUNTER — Other Ambulatory Visit: Payer: Self-pay

## 2013-05-31 ENCOUNTER — Ambulatory Visit (HOSPITAL_BASED_OUTPATIENT_CLINIC_OR_DEPARTMENT_OTHER): Payer: Managed Care, Other (non HMO) | Admitting: Oncology

## 2013-05-31 VITALS — BP 125/71 | HR 61 | Temp 98.5°F | Ht 66.5 in | Wt 194.7 lb

## 2013-05-31 DIAGNOSIS — C20 Malignant neoplasm of rectum: Secondary | ICD-10-CM

## 2013-05-31 DIAGNOSIS — C189 Malignant neoplasm of colon, unspecified: Secondary | ICD-10-CM

## 2013-05-31 DIAGNOSIS — K5900 Constipation, unspecified: Secondary | ICD-10-CM

## 2013-05-31 MED ORDER — PEG-KCL-NACL-NASULF-NA ASC-C 100 G PO SOLR: 1.0000 | ORAL | Status: DC

## 2013-05-31 NOTE — Telephone Encounter (Signed)
s.w. pt and advised on April appts...advised pt to pick barium b4 ct..pt ok and aware

## 2013-05-31 NOTE — Telephone Encounter (Signed)
Rx sent to the pharmacy and instructions mailed to pt.  

## 2013-05-31 NOTE — Telephone Encounter (Signed)
Gastroenterology Pre-Procedure Review  Request Date:05/30/2013 Requesting Physician: Dr. Darrick Penna  PATIENT REVIEW QUESTIONS: The patient responded to the following health history questions as indicated:    1. Diabetes Melitis: no 2. Joint replacements in the past 12 months: no 3. Major health problems in the past 3 months: no 4. Has an artificial valve or MVP: no 5. Has a defibrillator: no 6. Has been advised in past to take antibiotics in advance of a procedure like teeth cleaning: no    MEDICATIONS & ALLERGIES:    Patient reports the following regarding taking any blood thinners:   Plavix? no Aspirin? no Coumadin? no  Patient confirms/reports the following medications:  Current Outpatient Prescriptions  Medication Sig Dispense Refill  . docusate sodium (COLACE) 100 MG capsule Take 100 mg by mouth 2 (two) times daily as needed.      Marland Kitchen estradiol (ESTRACE) 0.5 MG tablet Take 0.5 mg by mouth daily.       . hydrocortisone (ANUSOL-HC) 2.5 % rectal cream Place rectally 2 (two) times daily. Apply around anus for irritated & painful hemorrhoids  15 g  2  . Linaclotide (LINZESS) 145 MCG CAPS capsule Take 145 mcg by mouth daily. One tablet every other day      . LORazepam (ATIVAN) 1 MG tablet Take 1 tablet (1 mg total) by mouth every 8 (eight) hours.  30 tablet  1  . meclizine (ANTIVERT) 25 MG tablet Take 1 tablet (25 mg total) by mouth 3 (three) times daily as needed.  30 tablet  1  . Wheat Dextrin (BENEFIBER DRINK MIX PO) Take 1 Can by mouth daily.        No current facility-administered medications for this visit.    Patient confirms/reports the following allergies:  Allergies  Allergen Reactions  . Codeine Nausea Only    Confirmed intolerance of codeine verbally with pt in PACU, pt states does not cause any SOB or dyspnea, some nausea only (MN-RN)  . Ultram [Tramadol] Nausea And Vomiting    Ultram allergy also discussed, active N&V occurs with Ultram  . Latex Itching and Rash     No orders of the defined types were placed in this encounter.    AUTHORIZATION INFORMATION Primary Insurance:   ID #:  Group #:  Pre-Cert / Auth required Pre-Cert / Auth #:   Secondary Insurance:  ID #:   Group #:  Pre-Cert / Auth required:  Pre-Cert / Auth #:   SCHEDULE INFORMATION: Procedure has been scheduled as follows:  Date: 06/14/2013      Time: 1:30 PM Location: [facility] Short Stay  This Gastroenterology Pre-Precedure Review Form is being routed to the following provider(s): Jonette Eva, MD

## 2013-05-31 NOTE — Progress Notes (Signed)
Hematology and Oncology Follow Up Visit  [name] [id number] 07-22-1966 47 y.o. 05/31/2013 9:06 AM   Principle Diagnosis: 47 year old diagnosed with T2N0 rectal cancer in 05/2012. Her tumor was 7-10 cm from the anal verge.    Prior Therapy: Patient is S/P laparoscopic LAR done on 07/02/2012. The pathology reveled: INVASIVE MODERATELY DIFFERENTIATED ADENOCARCINOMA INVADING INTO BUT NOTTHROUGH THE MUSCULARIS PROPRIA. - NO ANGIOLYMPHATIC INVASION IDENTIFIED. - SIXTEEN LYMPH NODES WITH FOCAL ENDOSALPINGIOSIS, NO EVIDENCE OF METASTATIC CARCINOMA(0/16). - Her tumor is Microsatellite stable.   Current therapy: Observation and follow up.   Interim History: [name] presents for a follow up visit. She is a nice women with the above diagnosis. She underwent an LAR on on 07/02/2012. She is recovering well at this time. She still reports constipation and sensation of fullness in her rectal area. She still requires laxatives to move her bowels on a regular basis. She continued to be very distressed about this fact that hoping that it will improve with time. No bleeding noted since the last visit.  No other pain or discomfort. Weight has been stable and has been able to drive and have resumed her work related duties.  Medications: I have reviewed the patient's current medications.  Current Outpatient Prescriptions  Medication Sig Dispense Refill  . docusate sodium (COLACE) 100 MG capsule Take 100 mg by mouth 2 (two) times daily as needed.      Marland Kitchen estradiol (ESTRACE) 0.5 MG tablet Take 0.5 mg by mouth daily.       . hydrocortisone (ANUSOL-HC) 2.5 % rectal cream Place rectally 2 (two) times daily. Apply around anus for irritated & painful hemorrhoids  15 g  2  . Linaclotide (LINZESS) 145 MCG CAPS capsule Take 145 mcg by mouth daily. One tablet every other day      . LORazepam (ATIVAN) 1 MG tablet Take 1 tablet (1 mg total) by mouth every 8 (eight) hours.  30 tablet  1  . meclizine (ANTIVERT) 25  MG tablet Take 1 tablet (25 mg total) by mouth 3 (three) times daily as needed.  30 tablet  1  . peg 3350 powder (MOVIPREP) 100 G SOLR Take 1 kit (200 g total) by mouth as directed.  1 kit  0  . Wheat Dextrin (BENEFIBER DRINK MIX PO) Take 1 Can by mouth daily.        No current facility-administered medications for this visit.    Allergies:  Allergies  Allergen Reactions  . Codeine Nausea Only    Confirmed intolerance of codeine verbally with pt in PACU, pt states does not cause any SOB or dyspnea, some nausea only (MN-RN)  . Ultram [Tramadol] Nausea And Vomiting    Ultram allergy also discussed, active N&V occurs with Ultram  . Latex Itching and Rash    Past Medical History, Surgical history, Social history, and Family History were reviewed and updated.  Review of Systems:  Remaining ROS negative. Physical Exam: Blood pressure 125/71, pulse 61, temperature 98.5 F (36.9 C), temperature source Oral, height 5' 6.5" (1.689 m), weight 194 lb 11.2 oz (88.315 kg), SpO2 99.00%. ECOG: 1 General appearance: alert Head: Normocephalic, without obvious abnormality, atraumatic Neck: no adenopathy, no carotid bruit, no JVD, supple, symmetrical, trachea midline and thyroid not enlarged, symmetric, no tenderness/mass/nodules Lymph nodes: Cervical, supraclavicular, and axillary nodes normal. Heart:regular rate and rhythm, S1, S2 normal, no murmur, click, rub or gallop Lung:chest clear, no wheezing, rales, normal symmetric air entry Abdomin: soft, non-tender, without masses or  organomegaly EXT:no erythema, induration, or nodules   Lab Results: Lab Results  Component Value Date   WBC 4.6 05/24/2013   HGB 14.2 05/24/2013   HCT 42.6 05/24/2013   MCV 91.8 05/24/2013   PLT 183 05/24/2013     Chemistry      Component Value Date/Time   NA 141 05/24/2013 0908   NA 137 07/04/2012 0421   K 4.4 05/24/2013 0908   K 3.9 07/04/2012 0421   CL 103 11/19/2012 1005   CL 107 07/04/2012 0421   CO2 28  05/24/2013 0908   CO2 25 07/04/2012 0421   BUN 8.0 05/24/2013 0908   BUN 7 07/04/2012 0421   CREATININE 0.8 05/24/2013 0908   CREATININE 0.74 07/04/2012 0421      Component Value Date/Time   CALCIUM 9.4 05/24/2013 0908   CALCIUM 8.3* 07/04/2012 0421   ALKPHOS 56 05/24/2013 0908   ALKPHOS 48 06/01/2012 1452   AST 17 05/24/2013 0908   AST 16 06/01/2012 1452   ALT 12 05/24/2013 0908   ALT 11 06/01/2012 1452   BILITOT 0.31 05/24/2013 0908   BILITOT 0.4 06/01/2012 1452      EXAM:  CT CHEST, ABDOMEN, AND PELVIS WITH CONTRAST  TECHNIQUE:  Multidetector CT imaging of the chest, abdomen and pelvis was  performed following the standard protocol during bolus  administration of intravenous contrast.  CONTRAST: OMNIPAQUE IOHEXOL 300 MG/ML SOLN  COMPARISON: 11/19/2012 and earlier.  FINDINGS:  CT CHEST FINDINGS  Major airways are patent. Normal lung volumes. Minor lower lobe  atelectasis on the left. Otherwise stable and negative lung  parenchyma.  No acute or suspicious osseous lesion in the thorax. 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  Negative thoracic inlet. Stable mediastinum with small volume  residual thymus. No mediastinal or hilar lymphadenopathy. No  pericardial or pleural effusion. Major mediastinal vascular  structures are within normal limits. No axillary lymphadenopathy. No  superior diaphragmatic lymphadenopathy.  CT ABDOMEN AND PELVIS FINDINGS  No acute or suspicious osseous lesion in the abdomen or pelvis.  Postoperative changes near the anal verge appear stable. No pelvic  free fluid. Negative rectum and sigmoid except for retained stool.  Similar appearance of the proximal sigmoid and left colon. Oral  contrast has reached the splenic flexure. Small distal descending  colon diverticulae unchanged. Retained stool throughout the colon.  No: Mass identified. Negative cecum and terminal ileum. No dilated  or abnormal small bowel loops.  Decompressed stomach. Negative  duodenum.  Negative spleen, pancreas, adrenal glands, and portal venous system.  Kidneys appear stable and within normal limits. Major arterial  structures throughout the abdomen and pelvis are patent. No  abdominal free fluid. No abdominal lymphadenopathy.  Stable small hypoechoic area in the right hepatic lobe near the dome  measuring 7-8 mm. Otherwise normal liver enhancement. Negative  gallbladder.  IMPRESSION:  1. No acute or metastatic process in the chest.  2. Stable abdomen and pelvis with no acute or metastatic process  identified. Chronic 7 mm low-density area in the liver unchanged  since 2013.    Impression and Plan:  47 year old with the following issues:  1. T2N0 rectal cancer diagnosed in 05/2012. She is S/P LAR in 06/2012. CT scan and labs from 05/24/2013 was discussed today and do not show any cancer relapse.  For now, we will continue active follow up and repeat scans and labs in 6 months.    2. Colonoscopic screen: She will be having  it done in the end of this month.  3. Hemorrhoids: she is using over the counter preparations and not interested in surgery for now.    4. Constipation: Seems to be a chronic problem and manageable with laxatives.    [facility], MD 10/17/20149:06 AM

## 2013-06-03 ENCOUNTER — Telehealth: Payer: Self-pay

## 2013-06-03 NOTE — Telephone Encounter (Signed)
REVIEWED.  

## 2013-06-03 NOTE — Telephone Encounter (Signed)
Called [employer] at [phone] and spoke to Theodoro Grist S who said a PA is not required for colonoscopy.

## 2013-06-14 ENCOUNTER — Encounter (HOSPITAL_COMMUNITY): Payer: Self-pay | Admitting: *Deleted

## 2013-06-14 ENCOUNTER — Ambulatory Visit (HOSPITAL_COMMUNITY)
Admission: RE | Admit: 2013-06-14 | Discharge: 2013-06-14 | Disposition: A | Discharge status: Home / Self Care | Payer: Managed Care, Other (non HMO) | Source: Ambulatory Visit | Attending: Gastroenterology | Admitting: Gastroenterology

## 2013-06-14 ENCOUNTER — Encounter (HOSPITAL_COMMUNITY)
Admission: RE | Disposition: A | Discharge status: Home / Self Care | Payer: Self-pay | Source: Ambulatory Visit | Attending: Gastroenterology

## 2013-06-14 DIAGNOSIS — C189 Malignant neoplasm of colon, unspecified: Secondary | ICD-10-CM

## 2013-06-14 DIAGNOSIS — Z1211 Encounter for screening for malignant neoplasm of colon: Secondary | ICD-10-CM

## 2013-06-14 DIAGNOSIS — K5730 Diverticulosis of large intestine without perforation or abscess without bleeding: Secondary | ICD-10-CM | POA: Insufficient documentation

## 2013-06-14 DIAGNOSIS — Z85038 Personal history of other malignant neoplasm of large intestine: Secondary | ICD-10-CM

## 2013-06-14 DIAGNOSIS — Z800 Family history of malignant neoplasm of digestive organs: Secondary | ICD-10-CM | POA: Insufficient documentation

## 2013-06-14 HISTORY — PX: COLONOSCOPY: SHX5424

## 2013-06-14 SURGERY — COLONOSCOPY
Anesthesia: Moderate Sedation

## 2013-06-14 MED ORDER — MIDAZOLAM HCL 5 MG/5ML IJ SOLN
INTRAMUSCULAR | Status: AC
  Filled 2013-06-14: qty 10

## 2013-06-14 MED ORDER — MEPERIDINE HCL 100 MG/ML IJ SOLN
INTRAMUSCULAR | Status: AC
  Filled 2013-06-14: qty 2

## 2013-06-14 MED ORDER — MIDAZOLAM HCL 5 MG/5ML IJ SOLN
INTRAMUSCULAR | Status: DC | PRN
  Administered 2013-06-14 (×2): 2 mg via INTRAVENOUS

## 2013-06-14 MED ORDER — SIMETHICONE 40 MG/0.6ML PO SUSP
ORAL | Status: DC | PRN
  Administered 2013-06-14: 14:00:00

## 2013-06-14 MED ORDER — SODIUM CHLORIDE 0.9 % IJ SOLN
INTRAMUSCULAR | Status: AC
  Filled 2013-06-14: qty 10

## 2013-06-14 MED ORDER — MEPERIDINE HCL 100 MG/ML IJ SOLN
INTRAMUSCULAR | Status: DC | PRN
  Administered 2013-06-14: 50 mg via INTRAVENOUS
  Administered 2013-06-14: 25 mg via INTRAVENOUS

## 2013-06-14 MED ORDER — SODIUM CHLORIDE 0.9 % IV SOLN
INTRAVENOUS | Status: DC
Start: 2013-06-14 — End: 2013-06-14
  Administered 2013-06-14: 13:00:00 via INTRAVENOUS

## 2013-06-14 MED ORDER — PROMETHAZINE HCL 25 MG/ML IJ SOLN
INTRAMUSCULAR | Status: AC
  Filled 2013-06-14: qty 1

## 2013-06-14 MED ORDER — PROMETHAZINE HCL 25 MG/ML IJ SOLN
12.5000 mg | Freq: Once | INTRAMUSCULAR | Status: AC
  Administered 2013-06-14: 12.5 mg via INTRAVENOUS

## 2013-06-14 NOTE — OR Nursing (Signed)
Patient began to complain of chest pain 8/10 in the middle of her chest. States, "it feels like it's burning on the inside." Patient stated she feels a little anxious. Dr. Darrick Penna notified and ordered Phenergan 12.5mg  IV.

## 2013-06-14 NOTE — Op Note (Signed)
[facility] [address] [place], [address]   COLONOSCOPY PROCEDURE REPORT  PATIENT: [name], [name]  MR#: [id number] BIRTHDATE: 04/12/1966 , 47  yrs. old GENDER: Female ENDOSCOPIST: Jonette Eva, MD REFERRED [id number] Christell Constant, M.D.  Almond Lint, M.D.  Eli Hose, M.D. PROCEDURE DATE:  06/14/2013 PROCEDURE:   Colonoscopy, screening INDICATIONS:High risk patient with personal history of colon cancer.  MEDICATIONS: Promethazine (Phenergan) 12.5mg  IV, Demerol 75 mg IV, and Versed 4 mg IV  DESCRIPTION OF PROCEDURE:    Physical exam was performed.  Informed consent was obtained from the patient after explaining the benefits, risks, and alternatives to procedure.  The patient was connected to monitor and placed in left lateral position. Continuous oxygen was provided by nasal cannula and IV medicine administered through an indwelling cannula.  After administration of sedation and rectal exam, the patients rectum was intubated and the EC-[id number] ([id number])  colonoscope was advanced under direct visualization to the ileum.  The scope was removed slowly by carefully examining the color, texture, anatomy, and integrity mucosa on the way out.  The patient was recovered in endoscopy and discharged home in satisfactory condition.     COLON FINDINGS: The mucosa appeared normal in the terminal ileum.  , Mild diverticulosis was noted throughout the entire examined colon. , The colon mucosa was otherwise normal.  , and Normal anastomosis.  UNABLE TO APPRECIATE HEMORRHOIDS.  PREP QUALITY: excellent. CECAL W/D TIME: 11 minutes COMPLICATIONS: None  ENDOSCOPIC IMPRESSION: 1.   Normal mucosa in the terminal ileum 2.   Mild diverticulosis was noted throughout the entire examined colon 3.   The colon mucosa was otherwise normal 4.   Normal anastomosis.  UNABLE TO APPRECIATE HEMORRHOIDS.   RECOMMENDATIONS: HIGH FIBER DIET TCS IN 10 YEARS WITH PEDS  SCOPE       _______________________________ Rosalie DoctorJonette Eva, MD 06/14/2013 5:23 PM

## 2013-06-14 NOTE — H&P (Signed)
Primary Care Physician:  Rudi Heap, MD Primary Gastroenterologist:  Dr. Darrick Penna  Pre-Procedure History & Physical: HPI:  [name] is a 47 y.o. female here for PERSONAL HISTORY OF COLON CANCER.  Past Medical History  Diagnosis Date  . Blood transfusion 1995  . Kidney stone   . Constipation     SOME BLOOD IN STOOL  . Anxiety     NEW-DUE TO ANXIETY OVER DX OF CANCER  . GERD (gastroesophageal reflux disease)     occasionally-NO MEDS  . Cancer     RECTAL CANCER  . PONV (postoperative nausea and vomiting)     used a scop patch last surgery-was better    Past Surgical History  Procedure Laterality Date  . Abdominal hysterectomy  1995    partial  . Left ankle surgery for fx  several yrs ago  . Endoscopic left fallopian tube removed  several yrs ago  . Excision/release bursa hip  09/15/2011    Dr Shelle Iron EXCISION/RELEASE BURSA HIP;  Surgeon: Javier Docker, MD;  Location: [facility];  Service: Orthopedics;  Laterality: Left;  Excision of Trochanteric Bursitis  . Colonoscopy  06/01/2012    Procedure: COLONOSCOPY;  Surgeon: West Bali, MD;  Location: [facility];  Service: Endoscopy;  Laterality: N/A;  1:30PM  . Eus  06/06/2012    Procedure: LOWER ENDOSCOPIC ULTRASOUND (EUS);  Surgeon: Willis Modena, MD;  Location: [facility];  Service: Endoscopy;  Laterality: N/A;  . Laparoscopic low anterior resection  07/02/2012    Procedure: LAPAROSCOPIC LOW ANTERIOR RESECTION;  Surgeon: Almond Lint, MD;  Location: [facility];  Service: General;  Laterality: N/A;  . Colon surgery    . Examination under anesthesia  09/07/2012    Procedure: EXAM UNDER ANESTHESIA;  Surgeon: Almond Lint, MD;  Location: [facility];  Service: General;  Laterality: N/A;  . Proctoscopy  09/07/2012    Procedure: PROCTOSCOPY;  Surgeon: Almond Lint, MD;  Location: [facility];  Service: General;  Laterality: N/A;  . Flexible sigmoidoscopy N/A 03/22/2013    Procedure: FLEXIBLE  SIGMOIDOSCOPY;  Surgeon: West Bali, MD;  Location: [facility];  Service: Endoscopy;  Laterality: N/A;  2:00    Prior to Admission medications   Medication Sig Start Date End Date Taking? Authorizing Provider  docusate sodium (COLACE) 100 MG capsule Take 100 mg by mouth 2 (two) times daily as needed for constipation.    Yes Historical Provider, MD  estradiol (ESTRACE) 0.5 MG tablet Take 0.5 mg by mouth daily.  03/11/13  Yes Historical Provider, MD  hydrocortisone (ANUSOL-HC) 2.5 % rectal cream Place 1 application rectally 2 (two) times daily as needed for hemorrhoids.   Yes Historical Provider, MD  LORazepam (ATIVAN) 1 MG tablet Take 1 mg by mouth daily as needed for anxiety.   Yes Historical Provider, MD  peg 3350 powder (MOVIPREP) 100 G SOLR Take 1 kit (200 g total) by mouth as directed. 05/31/13  Yes West Bali, MD  Wheat Dextrin (BENEFIBER DRINK MIX PO) Take 1 Can by mouth daily.    Yes Historical Provider, MD  Linaclotide Karlene Einstein) 145 MCG CAPS capsule Take 145 mcg by mouth every other day.  03/15/13   Almond Lint, MD  meclizine (ANTIVERT) 25 MG tablet Take 25 mg by mouth 3 (three) times daily as needed for dizziness.    Historical Provider, MD    Allergies as of 05/31/2013 - Review Complete 05/31/2013  Allergen Reaction Noted  . Codeine Nausea Only  12/15/2010  . Ultram [tramadol] Nausea And Vomiting 05/25/2012  . Latex Itching and Rash 07/03/2012    Family History  Problem Relation Age of Onset  . Other Sister   . Colon cancer Paternal Aunt     History   Social History  . Marital Status: Single    Spouse Name: N/A    Number of Children: 2  . 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 Smoking status: Never Smoker   . Smokeless tobacco: Never Used  . Alcohol Use: Yes     Comment: occasional couple times per mo  . Drug Use: No  . Sexual Activity: Yes    Birth Control/ Protection:  Surgical   Other Topics Concern  . Not on file   Social History Narrative   Lives alone & 2 kids          Review of Systems: See HPI, otherwise negative ROS   Physical Exam: BP 119/79  Pulse 62  Temp(Src) 98 F (36.7 C) (Oral)  Resp 21  Ht 5' 6.5" (1.689 m)  Wt 194 lb (87.998 kg)  BMI 30.85 kg/m2  SpO2 100% General:   Alert,  pleasant and cooperative in NAD Head:  Normocephalic and atraumatic. Neck:  Supple; Lungs:  Clear throughout to auscultation.    Heart:  Regular rate and rhythm. Abdomen:  Soft, nontender and nondistended. Normal bowel sounds, without guarding, and without rebound.   Neurologic:  Alert and  oriented x4;  grossly normal neurologically.  Impression/Plan:    PERSONAL HISTORY OF COLON CANCER.  PLAN:  1. TCS TODAY

## 2013-07-15 ENCOUNTER — Other Ambulatory Visit (INDEPENDENT_AMBULATORY_CARE_PROVIDER_SITE_OTHER): Payer: Self-pay | Admitting: General Surgery

## 2013-07-17 ENCOUNTER — Telehealth: Payer: Self-pay | Admitting: Nurse Practitioner

## 2013-07-17 MED ORDER — VALACYCLOVIR HCL 1 G PO TABS: 1000.0000 mg | ORAL_TABLET | Freq: Every day | ORAL | Status: DC

## 2013-07-17 NOTE — Telephone Encounter (Signed)
Patient aware.

## 2013-07-18 NOTE — Telephone Encounter (Signed)
Patient aware rx sent to pharmacy.  

## 2013-07-30 ENCOUNTER — Encounter (INDEPENDENT_AMBULATORY_CARE_PROVIDER_SITE_OTHER): Payer: Self-pay | Admitting: General Surgery

## 2013-08-15 HISTORY — PX: BACK SURGERY: SHX140

## 2013-08-16 ENCOUNTER — Ambulatory Visit (INDEPENDENT_AMBULATORY_CARE_PROVIDER_SITE_OTHER): Payer: Managed Care, Other (non HMO) | Admitting: General Practice

## 2013-08-16 ENCOUNTER — Encounter: Payer: Self-pay | Admitting: General Practice

## 2013-08-16 VITALS — BP 134/81 | HR 89 | Temp 97.5°F | Wt 198.5 lb

## 2013-08-16 DIAGNOSIS — A499 Bacterial infection, unspecified: Secondary | ICD-10-CM

## 2013-08-16 DIAGNOSIS — B9689 Other specified bacterial agents as the cause of diseases classified elsewhere: Secondary | ICD-10-CM

## 2013-08-16 DIAGNOSIS — B373 Candidiasis of vulva and vagina: Secondary | ICD-10-CM

## 2013-08-16 DIAGNOSIS — G4700 Insomnia, unspecified: Secondary | ICD-10-CM

## 2013-08-16 DIAGNOSIS — L299 Pruritus, unspecified: Secondary | ICD-10-CM

## 2013-08-16 DIAGNOSIS — B3731 Acute candidiasis of vulva and vagina: Secondary | ICD-10-CM

## 2013-08-16 DIAGNOSIS — N760 Acute vaginitis: Secondary | ICD-10-CM

## 2013-08-16 LAB — POCT WET PREP (WET MOUNT)

## 2013-08-16 MED ORDER — TRAZODONE HCL 50 MG PO TABS: 50.0000 mg | ORAL_TABLET | Freq: Every evening | ORAL | Status: DC | PRN

## 2013-08-16 MED ORDER — METRONIDAZOLE 500 MG PO TABS: 500.0000 mg | ORAL_TABLET | Freq: Two times a day (BID) | ORAL | Status: DC

## 2013-08-16 MED ORDER — NYSTATIN-TRIAMCINOLONE 100000-0.1 UNIT/GM-% EX CREA: 1.0000 "application " | TOPICAL_CREAM | Freq: Two times a day (BID) | CUTANEOUS | Status: DC

## 2013-08-16 MED ORDER — FLUCONAZOLE 150 MG PO TABS: 150.0000 mg | ORAL_TABLET | Freq: Once | ORAL | Status: DC

## 2013-08-16 NOTE — Progress Notes (Signed)
   Subjective:    Patient ID: [name], female    DOB: Nov 08, 1965, 48 y.o.   MRN: [id number]  Vaginal Itching The patient's pertinent negatives include no genital itching, genital lesions, genital odor, genital rash, missed menses, vaginal bleeding or vaginal discharge. This is a new problem. Episode onset: onset 1.5 weeks ago. The problem occurs constantly. The problem has been gradually worsening. The problem affects both sides. She is not pregnant. Associated symptoms include rash. Pertinent negatives include no discolored urine, flank pain, frequency, nausea or urgency. Nothing aggravates the symptoms. She has tried antifungals for the symptoms. The treatment provided no relief. She is not sexually active. She uses nothing for contraception. There is no history of an STD or vaginosis.  Patient reports a history of genital herpes and taking 1000mg  of acyclovir daily. She denies having an outbreak in several years.     Review of Systems  Gastrointestinal: Negative for nausea.  Genitourinary: Negative for urgency, frequency, flank pain, vaginal discharge and missed menses.  Skin: Positive for rash.       Objective:   Physical Exam  Constitutional: She is oriented to person, place, and time. She appears well-developed and well-nourished.  Cardiovascular: Normal rate, regular rhythm and normal heart sounds.   Pulmonary/Chest: Effort normal and breath sounds normal.  Neurological: She is alert and oriented to person, place, and time.  Skin: Skin is warm and dry. Rash noted. There is erythema.  Erythematous maculopapular rash to labia minora. Negative for drainage.     Results for orders placed in visit on 08/16/13  POCT WET PREP (WET MOUNT)      Result Value Range   Source Wet Prep POC vaginal     WBC, Wet Prep HPF POC 5-10     Bacteria Wet Prep HPF POC mod     BACTERIA WET PREP MORPHOLOGY POC       Clue Cells Wet Prep HPF POC Few     CLUE CELLS WET PREP WHIFF POC       Yeast  Wet Prep HPF POC None     KOH Wet Prep POC       Trichomonas Wet Prep HPF POC none          Assessment & Plan:  1. Itching  - POCT Wet Prep [facility])  2. Insomnia  - traZODone (DESYREL) 50 MG tablet; Take 1 tablet (50 mg total) by mouth at bedtime as needed for sleep.  Dispense: 30 tablet; Refill: 3  3. BV (bacterial vaginosis)  - metroNIDAZOLE (FLAGYL) 500 MG tablet; Take 1 tablet (500 mg total) by mouth 2 (two) times daily.  Dispense: 14 tablet; Refill: 0  4. Vulvovaginal candidiasis  - nystatin-triamcinolone (MYCOLOG II) cream; Apply 1 application topically 2 (two) times daily.  Dispense: 30 g; Refill: 0 - fluconazole (DIFLUCAN) 150 MG tablet; Take 1 tablet (150 mg total) by mouth once.  Dispense: 1 tablet; Refill: 0 -discussed proper perineal hygiene -RTO if symptoms worsen or unresolved Patient verbalized understanding Erby Pian, FNP-C

## 2013-08-16 NOTE — Patient Instructions (Signed)
Bacterial Vaginosis Bacterial vaginosis (BV) is a vaginal infection where the normal balance of bacteria in the vagina is disrupted. The normal balance is then replaced by an overgrowth of certain bacteria. There are several different kinds of bacteria that can cause BV. BV is the most common vaginal infection in women of childbearing age. CAUSES   The cause of BV is not fully understood. BV develops when there is an increase or imbalance of harmful bacteria.  Some activities or behaviors can upset the normal balance of bacteria in the vagina and put women at increased risk including:  Having a new sex partner or multiple sex partners.  Douching.  Using an intrauterine device (IUD) for contraception.  It is not clear what role sexual activity plays in the development of BV. However, women that have never had sexual intercourse are rarely infected with BV. Women do not get BV from toilet seats, bedding, swimming pools or from touching objects around them.  SYMPTOMS   Grey vaginal discharge.  A fish-like odor with discharge, especially after sexual intercourse.  Itching or burning of the vagina and vulva.  Burning or pain with urination.  Some women have no signs or symptoms at all. DIAGNOSIS  Your caregiver must examine the vagina for signs of BV. Your caregiver will perform lab tests and look at the sample of vaginal fluid through a microscope. They will look for bacteria and abnormal cells (clue cells), a pH test higher than 4.5, and a positive amine test all associated with BV.  RISKS AND COMPLICATIONS   Pelvic inflammatory disease (PID).  Infections following gynecology surgery.  Developing HIV.  Developing herpes virus. TREATMENT  Sometimes BV will clear up without treatment. However, all women with symptoms of BV should be treated to avoid complications, especially if gynecology surgery is planned. Female partners generally do not need to be treated. However, BV may spread  between female sex partners so treatment is helpful in preventing a recurrence of BV.   BV may be treated with antibiotics. The antibiotics come in either pill or vaginal cream forms. Either can be used with nonpregnant or pregnant women, but the recommended dosages differ. These antibiotics are not harmful to the baby.  BV can recur after treatment. If this happens, a second round of antibiotics will often be prescribed.  Treatment is important for pregnant women. If not treated, BV can cause a premature delivery, especially for a pregnant woman who had a premature birth in the past. All pregnant women who have symptoms of BV should be checked and treated.  For chronic reoccurrence of BV, treatment with a type of prescribed gel vaginally twice a week is helpful. HOME CARE INSTRUCTIONS   Finish all medication as directed by your caregiver.  Do not have sex until treatment is completed.  Tell your sexual partner that you have a vaginal infection. They should see their caregiver and be treated if they have problems, such as a mild rash or itching.  Practice safe sex. Use condoms. Only have 1 sex partner. PREVENTION  Basic prevention steps can help reduce the risk of upsetting the natural balance of bacteria in the vagina and developing BV:  Do not have sexual intercourse (be abstinent).  Do not douche.  Use all of the medicine prescribed for treatment of BV, even if the signs and symptoms go away.  Tell your sex partner if you have BV. That way, they can be treated, if needed, to prevent reoccurrence. SEEK MEDICAL CARE IF:     Your symptoms are not improving after 3 days of treatment.  You have increased discharge, pain, or fever. MAKE SURE YOU:   Understand these instructions.  Will watch your condition.  Will get help right away if you are not doing well or get worse. FOR MORE INFORMATION  Division of STD Prevention (DSTDP), Centers for Disease Control and Prevention:  www.cdc.gov/std American Social Health Association ([name]): www.ashastd.org  Document Released: 08/01/2005 Document Revised: 10/24/2011 Document Reviewed: 03/13/2013 ExitCare Patient Information 2014 [employer].  

## 2013-08-28 ENCOUNTER — Telehealth: Payer: Self-pay | Admitting: General Practice

## 2013-08-29 ENCOUNTER — Other Ambulatory Visit: Payer: Self-pay | Admitting: General Practice

## 2013-08-29 DIAGNOSIS — G4700 Insomnia, unspecified: Secondary | ICD-10-CM

## 2013-08-29 MED ORDER — ZOLPIDEM TARTRATE 5 MG PO TABS: 5.0000 mg | ORAL_TABLET | Freq: Every evening | ORAL | Status: DC | PRN

## 2013-08-29 NOTE — Telephone Encounter (Signed)
Please inform [name] called into pharmacy

## 2013-09-18 ENCOUNTER — Encounter: Payer: Self-pay | Admitting: General Practice

## 2013-09-18 ENCOUNTER — Ambulatory Visit (INDEPENDENT_AMBULATORY_CARE_PROVIDER_SITE_OTHER): Payer: Managed Care, Other (non HMO) | Admitting: General Practice

## 2013-09-18 VITALS — BP 131/93 | HR 75 | Temp 96.9°F | Ht 66.5 in | Wt 196.5 lb

## 2013-09-18 DIAGNOSIS — G4700 Insomnia, unspecified: Secondary | ICD-10-CM

## 2013-09-18 DIAGNOSIS — J0100 Acute maxillary sinusitis, unspecified: Secondary | ICD-10-CM

## 2013-09-18 MED ORDER — AZITHROMYCIN 250 MG PO TABS: ORAL_TABLET | ORAL | Status: DC

## 2013-09-18 MED ORDER — ZOLPIDEM TARTRATE 10 MG PO TABS: 10.0000 mg | ORAL_TABLET | Freq: Every evening | ORAL | Status: DC | PRN

## 2013-09-18 NOTE — Patient Instructions (Signed)

## 2013-09-18 NOTE — Progress Notes (Signed)
   Subjective:    Patient ID: [name], female    DOB: 1966/03/21, 48 y.o.   MRN: [id number]  Sinusitis This is a new problem. The current episode started in the past 7 days. The problem has been gradually worsening since onset. Associated symptoms include congestion and sinus pressure. Pertinent negatives include no chills or coughing. Past treatments include nothing. The treatment provided no relief.  Reports 5mg  of ambien isn't effective in helping sleep, she has taken 10mg  and is effective.     Review of Systems  Constitutional: Negative for chills.  HENT: Positive for congestion and sinus pressure.   Respiratory: Negative for cough and chest tightness.   Cardiovascular: Negative for chest pain and palpitations.       Objective:   Physical Exam  Constitutional: She appears well-developed and well-nourished.  HENT:  Head: Normocephalic and atraumatic.  Nose: Right sinus exhibits maxillary sinus tenderness and frontal sinus tenderness. Left sinus exhibits maxillary sinus tenderness and frontal sinus tenderness.  Mouth/Throat: Posterior oropharyngeal erythema present.  Cardiovascular: Normal rate, regular rhythm and normal heart sounds.   No murmur heard. Pulmonary/Chest: Effort normal and breath sounds normal. No respiratory distress. She exhibits no tenderness.  Neurological: She is alert.  Skin: Skin is warm and dry. No rash noted.  Psychiatric: She has a normal mood and affect.          Assessment & Plan:  1. Insomnia  - zolpidem (AMBIEN) 10 MG tablet; Take 1 tablet (10 mg total) by mouth at bedtime as needed for sleep.  Dispense: 30 tablet; Refill: 3  2. Sinusitis, acute maxillary  - azithromycin (ZITHROMAX) 250 MG tablet; Take as directed  Dispense: 6 tablet; Refill: 0 -increase fluids -RTO if symptoms worsen or unresolved Patient verbalized understanding Erby Pian, FNP-C

## 2013-09-20 ENCOUNTER — Ambulatory Visit (INDEPENDENT_AMBULATORY_CARE_PROVIDER_SITE_OTHER): Payer: Managed Care, Other (non HMO) | Admitting: General Surgery

## 2013-09-20 VITALS — BP 122/64 | HR 66 | Temp 98.0°F | Resp 18 | Ht 66.0 in | Wt 196.0 lb

## 2013-09-20 DIAGNOSIS — K5909 Other constipation: Secondary | ICD-10-CM

## 2013-09-20 DIAGNOSIS — C20 Malignant neoplasm of rectum: Secondary | ICD-10-CM

## 2013-09-20 DIAGNOSIS — K5904 Chronic idiopathic constipation: Secondary | ICD-10-CM

## 2013-09-20 MED ORDER — LINACLOTIDE 145 MCG PO CAPS: 145.0000 ug | ORAL_CAPSULE | Freq: Every day | ORAL | Status: DC

## 2013-09-20 NOTE — Patient Instructions (Signed)
Try increasing linzess to daily instead of every other day.  Follow up with me in 6 months.

## 2013-09-22 NOTE — Progress Notes (Signed)
HISTORY: Pt is now around 1+ year from lap assisted LAR.  She has significant difficulty up front with rectal spasms and pain.  She has had alternating constipation and diarrhea.  She is doing much better with linzess.  She is back at work.  She had her first colonoscopy post op and had no evidence of anastamotic recurrence.  At this point, her symptoms are much better.     PERTINENT REVIEW OF SYSTEMS: Otherwise negative x 11  Filed Vitals:   09/20/13 1046  BP: 122/64  Pulse: 66  Temp: 98 F (36.7 C)  Resp: 18   Wt Readings from Last 3 Encounters:  09/20/13 196 lb (88.905 kg)  09/18/13 196 lb 8 oz (89.132 kg)  08/16/13 198 lb 8 oz (90.039 kg)    EXAM: Head: Normocephalic and atraumatic.  Eyes:  Conjunctivae are normal. Pupils are equal, round, and reactive to light. No scleral icterus.  Neck:  Normal range of motion. Neck supple. No tracheal deviation present. No thyromegaly present.  Resp: No respiratory distress, normal effort. Abd:  Abdomen is soft, non distended.  She continues to have some left sided tenderness near her scar.  . No masses are palpable.  There is no rebound and no guarding.  Rectal:  No palpable masses.  Good sphincter tone.   Neurological: Alert and oriented to person, place, and time. Coordination normal.  Skin: Skin is warm and dry. No rash noted. No diaphoretic. No erythema. No pallor.  Psychiatric:Flat affect.  Sl depressed mood.  Normal behavior. Judgment and thought content normal.      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    1 year colonoscopy is complete.    Will need another in around 2-3 years.    Follow up with me in 6 months.   Increase linzess to every day.         Milus Height, MD Surgical Oncology, [facility]  Redge Gainer, MD Chipper Herb, MD

## 2013-09-22 NOTE — Assessment & Plan Note (Addendum)
No clinical evidence of disease.    1 year colonoscopy is complete.    Will need another in around 2-3 years.    Follow up with me in 6 months.   Increase linzess to every day.

## 2013-11-13 ENCOUNTER — Telehealth: Payer: Self-pay | Admitting: Medical Oncology

## 2013-11-13 NOTE — Telephone Encounter (Signed)
Patient called concerned that her CT scan has not been scheduled as of yet. Explained to pt that Dr Alen Blew has placed an order for it and informed patient that the scan has to preauthorized by her insurance before it can be scheduled and more than likely we are waiting on her insurance to authorize it. Patient expressed thanks, no further questions at this time.

## 2013-11-26 IMAGING — CT CT ABD-PELV W/ CM
2 of 5 series · 12 of 36 positions shown, 18 images · IV contrast (READICAT/WATER & [ID] OMNI [id number])
Comparison: Chest CT on 06/28/2012 and abdomen pelvis CT on
06/01/2012

CT CHEST

CLINICAL DATA: Follow-up rectal carcinoma.  Left-sided abdominal
pain.  Previous laparoscopic low anterior resection.

CT CHEST, ABDOMEN AND PELVIS WITH CONTRAST
TECHNIQUE: Multidetector CT imaging of the chest, abdomen and
pelvis was performed following the standard protocol during bolus
administration of intravenous contrast.
Contrast: ,100mL OMNIPAQUE IOHEXOL 300 MG/ML  SOLN

[Series 601: coronal body · coronal · 1.22mm/px · 1 of 118 slices shown, 2 images]
[im 40/118  soft-tissue]
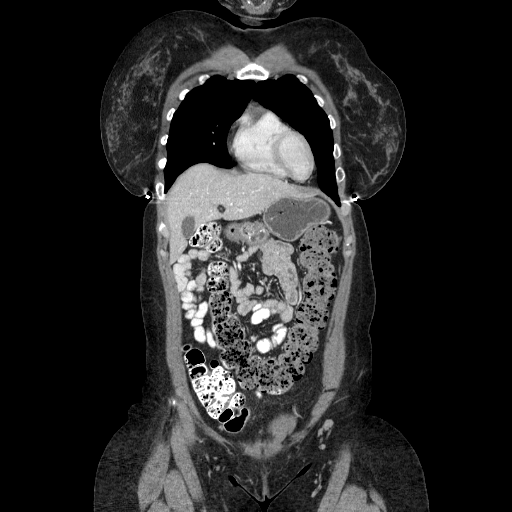
[im 40/118  bone]
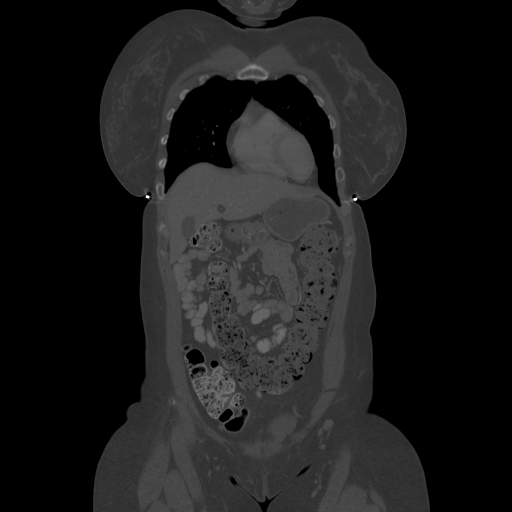

[Series 602: sagittal body · sagittal · 1.22mm/px · 11 of 178 slices shown, 16 images]
[im 13/178  soft-tissue]
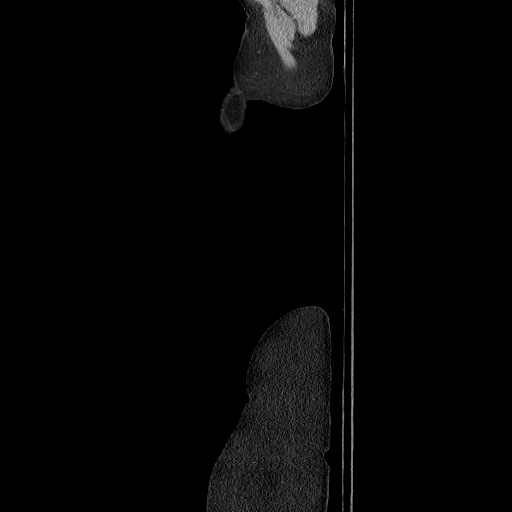
[im 13/178  lung]
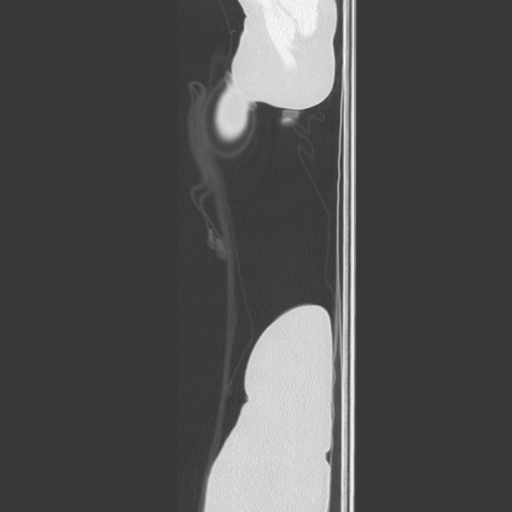
[im 13/178  bone]
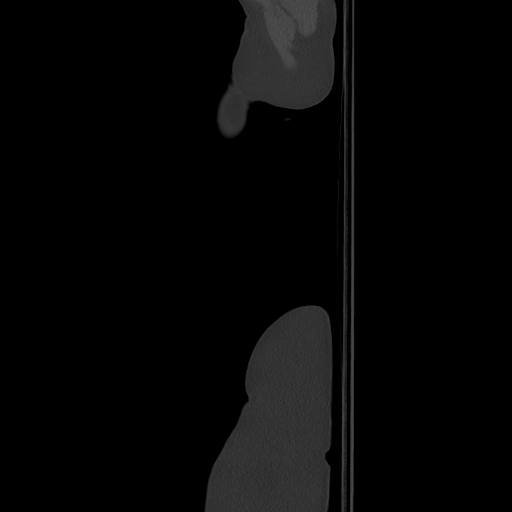
[im 26/178  soft-tissue]
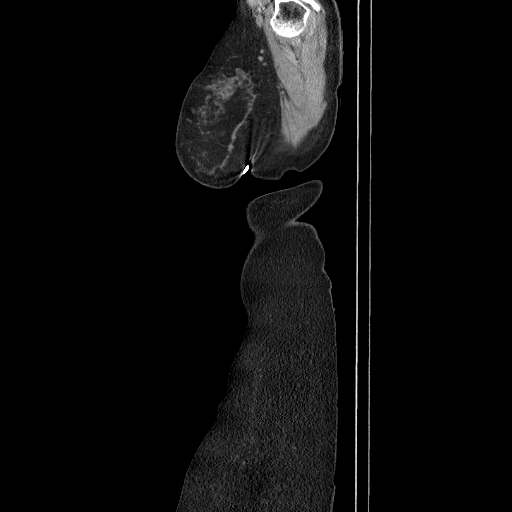
[im 26/178  lung]
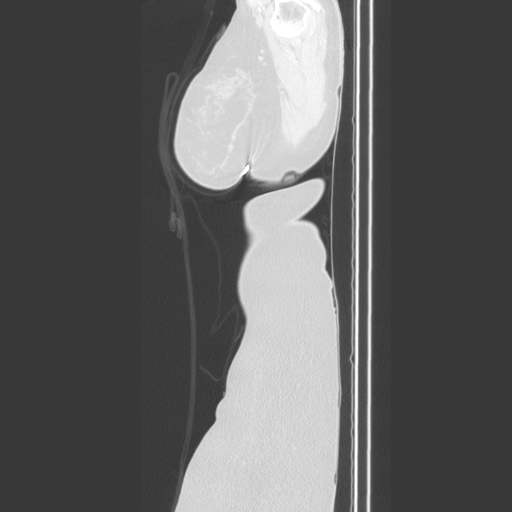
[im 38/178  lung]
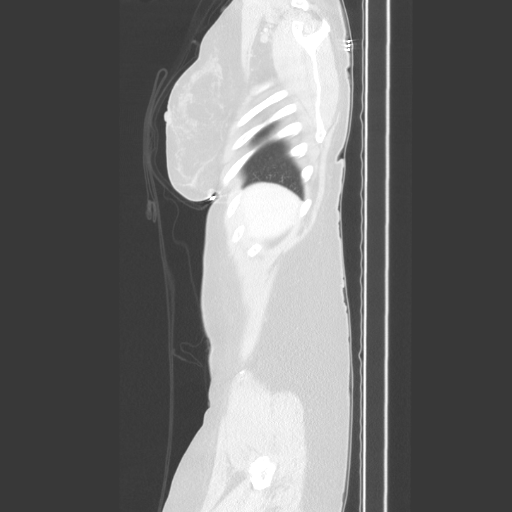
[im 51/178  soft-tissue]
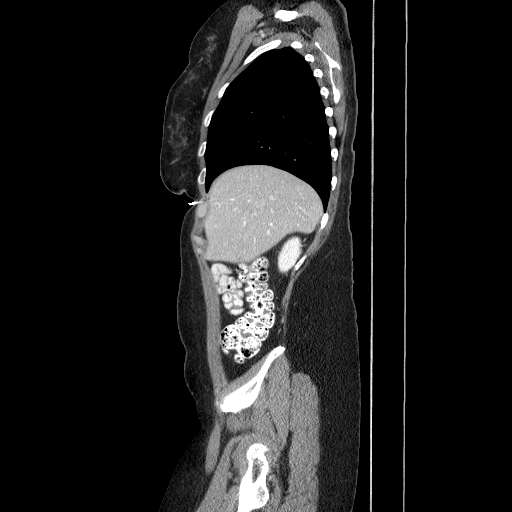
[im 51/178  lung]
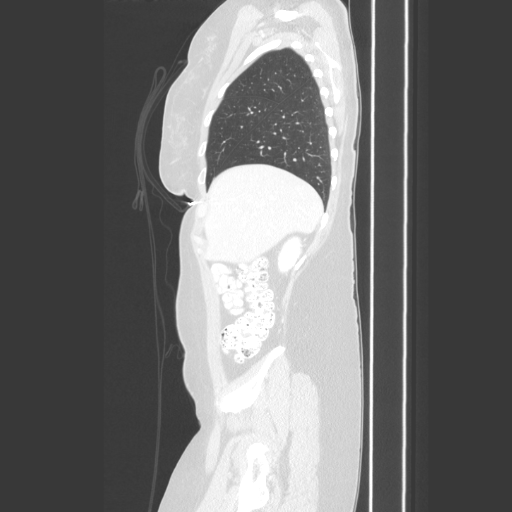
[im 64/178  soft-tissue]
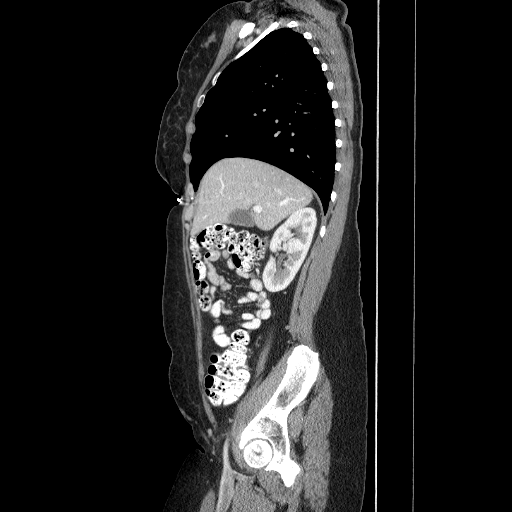
[im 76/178  soft-tissue]
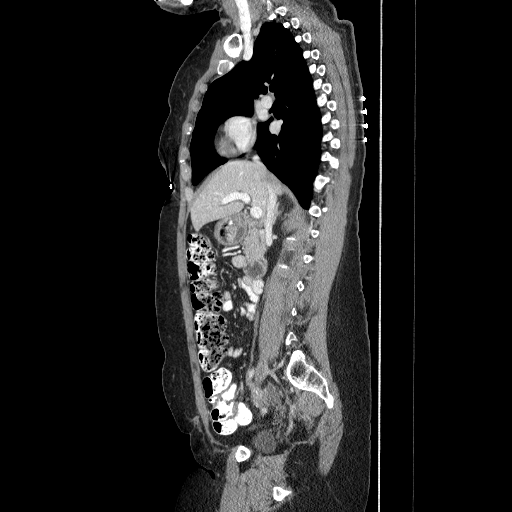
[im 102/178  soft-tissue]
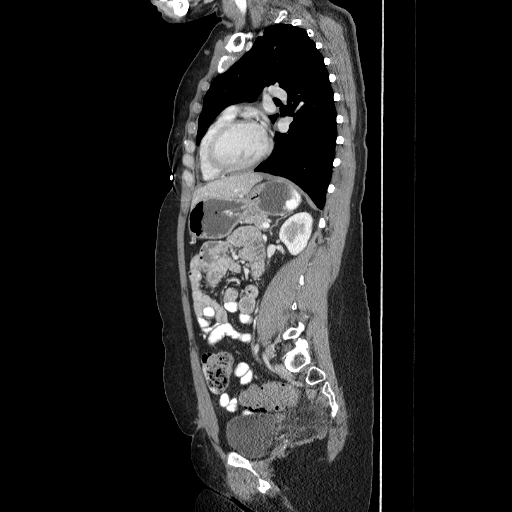
[im 114/178  soft-tissue]
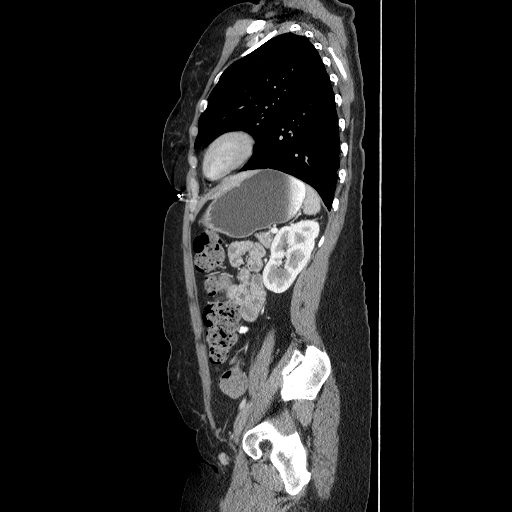
[im 127/178  soft-tissue]
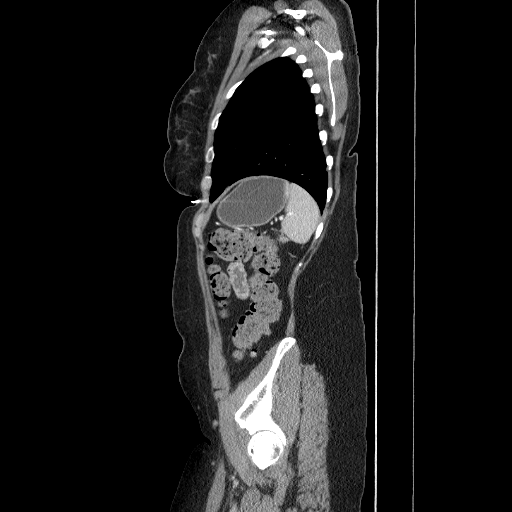
[im 152/178  soft-tissue]
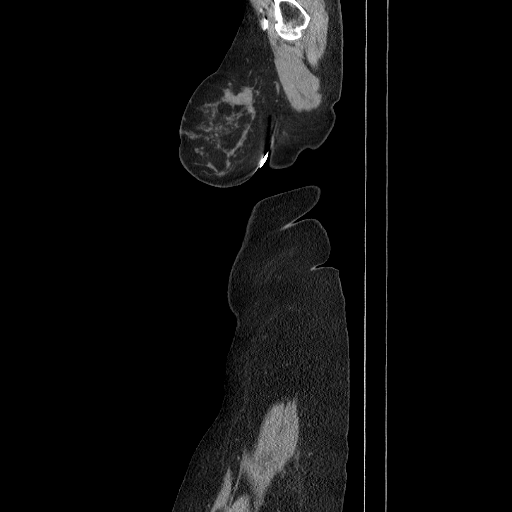
[im 152/178  bone]
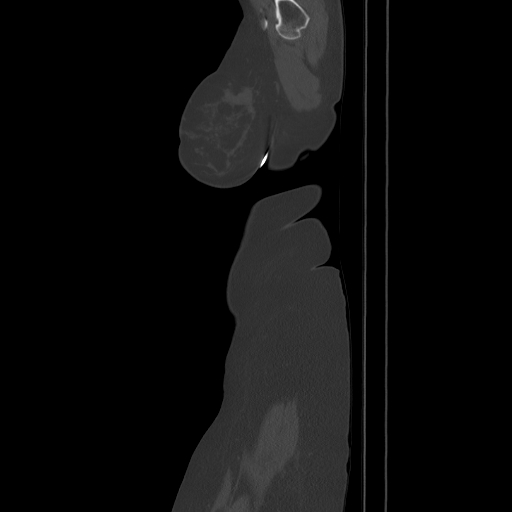
[im 165/178  soft-tissue]
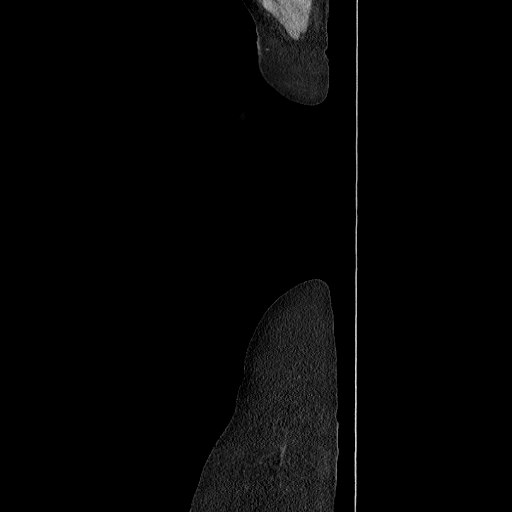

[12 of 36 positions shown; findings below may reference images not displayed]

FINDINGS: No evidence of hilar or mediastinal masses.  No
lymphadenopathy seen elsewhere within the thorax.  No evidence of
pleural or pericardial effusion.  No suspicious pulmonary nodules
or masses identified.  No evidence of pulmonary infiltrate or
central endobronchial lesion.  No evidence of chest wall mass or
suspicious bone lesions.
IMPRESSION: Negative.  No evidence of metastatic disease or other acute
findings within the thorax.

CT ABDOMEN AND PELVIS
FINDINGS: A persistent small approximately 10 mm low attenuation
lesion is seen in the dome of the right hepatic lobe on image 45.
This is too small to characterize, but appears to have been present
on previous noncontrast CT performed at [facility] on
03/27/2011 suggesting a benign etiology.  No other liver lesions
are identified.

The gallbladder, pancreas, spleen, adrenal glands, and kidneys are
normal appearance.  No evidence of hydronephrosis.  No
lymphadenopathy identified within the abdomen or pelvis.

Prior hysterectomy noted.  Adnexal regions are unremarkable.  No
evidence of inflammatory process or abnormal fluid collections.
Mild diverticulosis is seen promptly involving the low distal
descending colon, however there is no evidence of diverticulitis.
No evidence of bowel wall thickening, dilatation, or hernia.  No
suspicious bone lesions identified.
IMPRESSION: 1.  1 cm low attenuation lesion in the dome of the right hepatic
lobe, which is too small to characterize.  This was probably
present on previous noncontrast study in 3163, suggesting a benign
etiology.  Recommend continued follow-up by CT, or alternatively,
abdomen MRI without with contrast could be performed for further
characterization.
2.  No other signs of metastatic disease identified.
3.  Diverticulosis.  No radiographic evidence of diverticulitis.

## 2013-11-29 ENCOUNTER — Other Ambulatory Visit (HOSPITAL_BASED_OUTPATIENT_CLINIC_OR_DEPARTMENT_OTHER): Payer: Managed Care, Other (non HMO)

## 2013-11-29 ENCOUNTER — Ambulatory Visit
Admission: RE | Admit: 2013-11-29 | Discharge: 2013-11-29 | Disposition: A | Discharge status: Home / Self Care | Payer: Managed Care, Other (non HMO) | Source: Ambulatory Visit | Attending: Oncology | Admitting: Oncology

## 2013-11-29 DIAGNOSIS — C20 Malignant neoplasm of rectum: Secondary | ICD-10-CM

## 2013-11-29 LAB — COMPREHENSIVE METABOLIC PANEL (CC13)
ALT: 10 U/L (ref 0–55)
AST: 16 U/L (ref 5–34)
Albumin: 3.9 g/dL (ref 3.5–5.0)
Alkaline Phosphatase: 54 U/L (ref 40–150)
Anion Gap: 9 mEq/L (ref 3–11)
BUN: 6.6 mg/dL — AB (ref 7.0–26.0)
CHLORIDE: 108 meq/L (ref 98–109)
CO2: 26 mEq/L (ref 22–29)
CREATININE: 0.7 mg/dL (ref 0.6–1.1)
Calcium: 9.5 mg/dL (ref 8.4–10.4)
Glucose: 95 mg/dl (ref 70–140)
Potassium: 4 mEq/L (ref 3.5–5.1)
Sodium: 144 mEq/L (ref 136–145)
Total Bilirubin: 0.47 mg/dL (ref 0.20–1.20)
Total Protein: 7.3 g/dL (ref 6.4–8.3)

## 2013-11-29 LAB — CBC WITH DIFFERENTIAL/PLATELET
BASO%: 0.5 % (ref 0.0–2.0)
BASOS ABS: 0 10*3/uL (ref 0.0–0.1)
EOS%: 1.2 % (ref 0.0–7.0)
Eosinophils Absolute: 0.1 10*3/uL (ref 0.0–0.5)
HCT: 43.2 % (ref 34.8–46.6)
HEMOGLOBIN: 14.4 g/dL (ref 11.6–15.9)
LYMPH#: 1.5 10*3/uL (ref 0.9–3.3)
LYMPH%: 33.5 % (ref 14.0–49.7)
MCH: 30.6 pg (ref 25.1–34.0)
MCHC: 33.2 g/dL (ref 31.5–36.0)
MCV: 92.1 fL (ref 79.5–101.0)
MONO#: 0.3 10*3/uL (ref 0.1–0.9)
MONO%: 6.7 % (ref 0.0–14.0)
NEUT#: 2.6 10*3/uL (ref 1.5–6.5)
NEUT%: 58.1 % (ref 38.4–76.8)
PLATELETS: 195 10*3/uL (ref 145–400)
RBC: 4.69 10*6/uL (ref 3.70–5.45)
RDW: 12.8 % (ref 11.2–14.5)
WBC: 4.5 10*3/uL (ref 3.9–10.3)

## 2013-11-29 MED ORDER — IOHEXOL 300 MG/ML  SOLN: 125.0000 mL | Freq: Once | INTRAMUSCULAR | Status: AC | PRN

## 2013-11-30 LAB — CEA: CEA: 0.9 ng/mL (ref 0.0–5.0)

## 2013-12-03 ENCOUNTER — Telehealth: Payer: Self-pay | Admitting: Oncology

## 2013-12-03 ENCOUNTER — Encounter: Payer: Self-pay | Admitting: Oncology

## 2013-12-03 ENCOUNTER — Ambulatory Visit (HOSPITAL_BASED_OUTPATIENT_CLINIC_OR_DEPARTMENT_OTHER): Payer: Managed Care, Other (non HMO) | Admitting: Oncology

## 2013-12-03 VITALS — BP 131/78 | HR 68 | Temp 98.3°F | Resp 18 | Ht 66.0 in | Wt 199.4 lb

## 2013-12-03 DIAGNOSIS — K5900 Constipation, unspecified: Secondary | ICD-10-CM

## 2013-12-03 DIAGNOSIS — C20 Malignant neoplasm of rectum: Secondary | ICD-10-CM

## 2013-12-03 NOTE — Telephone Encounter (Signed)
[facility] ADN PRINTED APPT SCHED AND AVS FOR PT FOR oct °

## 2013-12-03 NOTE — Progress Notes (Signed)
Hematology and Oncology Follow Up Visit  [name] [id number] May 25, 1966 48 y.o. 12/03/2013 10:28 AM   Principle Diagnosis: 48 year old diagnosed with T2N0 rectal cancer in 05/2012. Her tumor was 7-10 cm from the anal verge.    Prior Therapy: Patient is S/P laparoscopic LAR done on 07/02/2012. The pathology reveled: INVASIVE MODERATELY DIFFERENTIATED ADENOCARCINOMA INVADING INTO BUT NOTTHROUGH THE MUSCULARIS PROPRIA. - NO ANGIOLYMPHATIC INVASION IDENTIFIED. - SIXTEEN LYMPH NODES WITH FOCAL ENDOSALPINGIOSIS, NO EVIDENCE OF METASTATIC CARCINOMA(0/16). - Her tumor is Microsatellite stable.   Current therapy: Observation and follow up.   Interim History: [name] presents for a follow up visit. She is a nice women with the above diagnosis. She underwent an LAR on on 07/02/2012. She is doing well at this time. She still reports constipation and sensation of fullness in her rectal area. She still requires laxatives to move her bowels on a regular basis. She continued distressed about that but seems to be coping better this time around. She follows with Dr. Barry Dienes and she is pleased with her progress at this time.. No bleeding noted since the last visit.  No other pain or discomfort. Weight has been stable and has been able to drive and have resumed her work related duties.  Medications: I have reviewed the patient's current medications.  Current Outpatient Prescriptions  Medication Sig Dispense Refill  . azithromycin (ZITHROMAX) 250 MG tablet Take as directed  6 tablet  0  . docusate sodium (COLACE) 100 MG capsule Take 100 mg by mouth 2 (two) times daily as needed for constipation.       Marland Kitchen estradiol (ESTRACE) 0.5 MG tablet Take 0.5 mg by mouth daily.       . hydrocortisone (ANUSOL-HC) 2.5 % rectal cream Place 1 application rectally 2 (two) times daily as needed for hemorrhoids.      . Linaclotide (LINZESS) 145 MCG CAPS capsule Take 1 capsule (145 mcg total) by mouth daily.  30 capsule  6   . LORazepam (ATIVAN) 1 MG tablet Take 1 mg by mouth daily as needed for anxiety.      . meclizine (ANTIVERT) 25 MG tablet Take 25 mg by mouth 3 (three) times daily as needed for dizziness.      . valACYclovir (VALTREX) 1000 MG tablet Take 1 tablet (1,000 mg total) by mouth daily.  30 tablet  5  . Wheat Dextrin (BENEFIBER DRINK MIX PO) Take 1 Can by mouth daily.       Marland Kitchen zolpidem (AMBIEN) 10 MG tablet Take 1 tablet (10 mg total) by mouth at bedtime as needed for sleep.  30 tablet  3   No current facility-administered medications for this visit.    Allergies:  Allergies  Allergen Reactions  . Codeine Nausea Only    Confirmed intolerance of codeine verbally with pt in PACU, pt states does not cause any SOB or dyspnea, some nausea only (MN-RN)  . Ultram [Tramadol] Nausea And Vomiting    Ultram allergy also discussed, active N&V occurs with Ultram  . Latex Itching and Rash    Past Medical History, Surgical history, Social history, and Family History were reviewed and updated.  Review of Systems:  Remaining ROS negative. Physical Exam:  Blood pressure 131/78, pulse 68, temperature 98.3 F (36.8 C), temperature source Oral, resp. rate 18, height _0  (1.676 m), weight 199 lb 6.4 oz (90.447 kg). ECOG: 1 General appearance: alert Head: Normocephalic, without obvious abnormality, atraumatic Neck: no adenopathy, no carotid bruit, no JVD, supple, symmetrical,  trachea midline and thyroid not enlarged, symmetric, no tenderness/mass/nodules Lymph nodes: Cervical, supraclavicular, and axillary nodes normal. Heart:regular rate and rhythm, S1, S2 normal, no murmur, click, rub or gallop Lung:chest clear, no wheezing, rales, normal symmetric air entry Abdomin: soft, non-tender, without masses or organomegaly EXT:no erythema, induration, or nodules   Lab Results: Lab Results  Component Value Date   WBC 4.5 11/29/2013   HGB 14.4 11/29/2013   HCT 43.2 11/29/2013   MCV 92.1 11/29/2013   PLT 195  11/29/2013     Chemistry      Component Value Date/Time   NA 144 11/29/2013 1015   NA 137 07/04/2012 0421   K 4.0 11/29/2013 1015   K 3.9 07/04/2012 0421   CL 103 11/19/2012 1005   CL 107 07/04/2012 0421   CO2 26 11/29/2013 1015   CO2 25 07/04/2012 0421   BUN 6.6* 11/29/2013 1015   BUN 7 07/04/2012 0421   CREATININE 0.7 11/29/2013 1015   CREATININE 0.74 07/04/2012 0421      Component Value Date/Time   CALCIUM 9.5 11/29/2013 1015   CALCIUM 8.3* 07/04/2012 0421   ALKPHOS 54 11/29/2013 1015   ALKPHOS 48 06/01/2012 1452   AST 16 11/29/2013 1015   AST 16 06/01/2012 1452   ALT 10 11/29/2013 1015   ALT 11 06/01/2012 1452   BILITOT 0.47 11/29/2013 1015   BILITOT 0.4 06/01/2012 1452      EXAM:  CT CHEST, ABDOMEN, AND PELVIS WITH CONTRAST  TECHNIQUE:  Multidetector CT imaging of the chest, abdomen and pelvis was  performed following the standard protocol during bolus  administration of intravenous contrast.  CONTRAST: 125 mL Omnipaque  COMPARISON: CT 05/24/2013  FINDINGS:  CT CHEST FINDINGS  No axillary or supraclavicular lymphadenopathy. No mediastinal or  hilar lymphadenopathy. No pericardial fluid.  No focal stress that no suspicious pulmonary nodules. Airways are  normal.  CT ABDOMEN AND PELVIS FINDINGS  No focal hepatic lesion. The gallbladder, pancreas, spleen, adrenal  glands, and kidneys are normal. Stomach, small bowel, appendix,  cecum are normal. There is a low rectal anastomosis. No evidence of  nodularity obstruction.  Abdominal or is normal caliber. No retroperitoneal periportal  lymphadenopathy. No mesenteric metastasis appear  No free fluid in the pelvis. No pelvic sidewall mass. Post  hysterectomy anatomy. No iliac adenopathy. No presacral adenopathy.  No aggressive osseous lesion.  IMPRESSION:  1. No evidence of local colon cancer recurrence at the low rectal  anastomosis.  2. No evidence of distant metastasis in the chest, abdomen, or  pelvis.    Impression  and Plan:  48 year old with the following issues:  1. T2N0 rectal cancer diagnosed in 05/2012. She is S/P LAR in 06/2012. CT scan and labs from  11/29/2013 was discussed today and do not show any cancer relapse.  For now, we will continue active follow up and repeat labs in 6 months. I will repeat her CT scan in 12 months.   2. Colonoscopic screen: She is up-to-date at this point and her repeat colonoscopy will be in 3 years.  3. Hemorrhoids: she is using over the counter preparations and not interested in surgery for now.    4. Constipation: Seems to be a chronic problem and manageable with laxatives.    Wyatt Portela, MD 4/21/201510:28 AM

## 2013-12-18 ENCOUNTER — Encounter: Payer: Self-pay | Admitting: Nurse Practitioner

## 2013-12-18 ENCOUNTER — Ambulatory Visit (INDEPENDENT_AMBULATORY_CARE_PROVIDER_SITE_OTHER): Payer: Managed Care, Other (non HMO) | Admitting: Nurse Practitioner

## 2013-12-18 VITALS — BP 101/67 | HR 78 | Temp 97.7°F | Ht 66.0 in | Wt 201.2 lb

## 2013-12-18 DIAGNOSIS — R300 Dysuria: Secondary | ICD-10-CM

## 2013-12-18 DIAGNOSIS — N390 Urinary tract infection, site not specified: Secondary | ICD-10-CM

## 2013-12-18 LAB — POCT UA - MICROSCOPIC ONLY
Casts, Ur, LPF, POC: NEGATIVE
Crystals, Ur, HPF, POC: NEGATIVE
Mucus, UA: NEGATIVE
YEAST UA: NEGATIVE

## 2013-12-18 LAB — POCT URINALYSIS DIPSTICK

## 2013-12-18 MED ORDER — NITROFURANTOIN MONOHYD MACRO 100 MG PO CAPS: 100.0000 mg | ORAL_CAPSULE | Freq: Two times a day (BID) | ORAL | Status: DC

## 2013-12-18 NOTE — Patient Instructions (Signed)
Urinary Tract Infection  Urinary tract infections (UTIs) can develop anywhere along your urinary tract. Your urinary tract is your body's drainage system for removing wastes and extra water. Your urinary tract includes two kidneys, two ureters, a bladder, and a urethra. Your kidneys are a pair of bean-shaped organs. Each kidney is about the size of your fist. They are located below your ribs, one on each side of your spine.  CAUSES  Infections are caused by microbes, which are microscopic organisms, including fungi, viruses, and bacteria. These organisms are so small that they can only be seen through a microscope. Bacteria are the microbes that most commonly cause UTIs.  SYMPTOMS   Symptoms of UTIs may vary by age and gender of the patient and by the location of the infection. Symptoms in young women typically include a frequent and intense urge to urinate and a painful, burning feeling in the bladder or urethra during urination. Older women and men are more likely to be tired, shaky, and weak and have muscle aches and abdominal pain. A fever may mean the infection is in your kidneys. Other symptoms of a kidney infection include pain in your back or sides below the ribs, nausea, and vomiting.  DIAGNOSIS  To diagnose a UTI, your caregiver will ask you about your symptoms. Your caregiver also will ask to provide a urine sample. The urine sample will be tested for bacteria and white blood cells. White blood cells are made by your body to help fight infection.  TREATMENT   Typically, UTIs can be treated with medication. Because most UTIs are caused by a bacterial infection, they usually can be treated with the use of antibiotics. The choice of antibiotic and length of treatment depend on your symptoms and the type of bacteria causing your infection.  HOME CARE INSTRUCTIONS   If you were prescribed antibiotics, take them exactly as your caregiver instructs you. Finish the medication even if you feel better after you  have only taken some of the medication.   Drink enough water and fluids to keep your urine clear or pale yellow.   Avoid caffeine, tea, and carbonated beverages. They tend to irritate your bladder.   Empty your bladder often. Avoid holding urine for long periods of time.   Empty your bladder before and after sexual intercourse.   After a bowel movement, women should cleanse from front to back. Use each tissue only once.  SEEK MEDICAL CARE IF:    You have back pain.   You develop a fever.   Your symptoms do not begin to resolve within 3 days.  SEEK IMMEDIATE MEDICAL CARE IF:    You have severe back pain or lower abdominal pain.   You develop chills.   You have nausea or vomiting.   You have continued burning or discomfort with urination.  MAKE SURE YOU:    Understand these instructions.   Will watch your condition.   Will get help right away if you are not doing well or get worse.  Document Released: 05/11/2005 Document Revised: 01/31/2012 Document Reviewed: 09/09/2011  Exit[employer] Patient Information 2014 [employer].

## 2013-12-18 NOTE — Progress Notes (Signed)
   Subjective:    Patient ID: [name], female    DOB: May 20, 1966, 48 y.o.   MRN: [id number]  HPI Patient in c/o dysuria and urinary frequency- started over the weekend and has gradually gotten worse- Has been drinking cranberry jiuce and taking AZO OTC.    Review of Systems  Constitutional: Negative.   HENT: Negative.   Respiratory: Negative.   Genitourinary: Positive for dysuria, urgency and frequency.  All other systems reviewed and are negative.      Objective:   Physical Exam  Constitutional: She is oriented to person, place, and time. She appears well-developed and well-nourished.  Cardiovascular: Normal rate and normal heart sounds.   Pulmonary/Chest: Effort normal and breath sounds normal.  Abdominal: Soft. Bowel sounds are normal. There is tenderness (mild pain on palpation suprapubic region.).  Genitourinary:  NO CVA tenderness bil.  Neurological: She is alert and oriented to person, place, and time.  Skin: Skin is warm.  Psychiatric: She has a normal mood and affect. Her behavior is normal. Judgment and thought content normal.   BP 101/67  Pulse 78  Temp(Src) 97.7 F (36.5 C) (Oral)  Ht 5\' 6"  (1.676 m)  Wt 201 lb 3.2 oz (91.264 kg)  BMI 32.49 kg/m2   Results for orders placed in visit on 12/18/13  POCT URINALYSIS DIPSTICK      Result Value Ref Range   Color, UA orange     Clarity, UA clear    POCT UA - MICROSCOPIC ONLY      Result Value Ref Range   WBC, Ur, HPF, POC 1-5     RBC, urine, microscopic occ     Bacteria, U Microscopic many     Mucus, UA negative     Epithelial cells, urine per micros few     Crystals, Ur, HPF, POC negative     Casts, Ur, LPF, POC negative     Yeast, UA negative            Assessment & Plan:   1. Dysuria   2. UTI (urinary tract infection)    Meds ordered this encounter  Medications  . nitrofurantoin, macrocrystal-monohydrate, (MACROBID) 100 MG capsule    Sig: Take 1 capsule (100 mg total) by mouth 2 (two)  times daily.    Dispense:  14 capsule    Refill:  0    Order Specific Question:  Supervising Provider    Answer:  Joycelyn Man   Force fluids AZO over the counter X2 days RTO prn Culture pending  Mary-Margaret Hassell Done, FNP

## 2013-12-26 ENCOUNTER — Telehealth: Payer: Self-pay | Admitting: Nurse Practitioner

## 2013-12-26 MED ORDER — FLUCONAZOLE 150 MG PO TABS: 150.0000 mg | ORAL_TABLET | Freq: Once | ORAL | Status: DC

## 2013-12-26 NOTE — Telephone Encounter (Signed)
Diflucan sent to pharmacy.

## 2014-03-12 ENCOUNTER — Encounter (INDEPENDENT_AMBULATORY_CARE_PROVIDER_SITE_OTHER): Payer: Self-pay | Admitting: General Surgery

## 2014-04-18 ENCOUNTER — Ambulatory Visit (INDEPENDENT_AMBULATORY_CARE_PROVIDER_SITE_OTHER): Payer: Managed Care, Other (non HMO) | Admitting: Nurse Practitioner

## 2014-04-18 ENCOUNTER — Encounter: Payer: Self-pay | Admitting: Nurse Practitioner

## 2014-04-18 VITALS — BP 116/77 | HR 73 | Temp 97.4°F | Ht 66.0 in | Wt 204.0 lb

## 2014-04-18 DIAGNOSIS — N951 Menopausal and female climacteric states: Secondary | ICD-10-CM

## 2014-04-18 DIAGNOSIS — G4700 Insomnia, unspecified: Secondary | ICD-10-CM

## 2014-04-18 DIAGNOSIS — K5904 Chronic idiopathic constipation: Secondary | ICD-10-CM

## 2014-04-18 DIAGNOSIS — N898 Other specified noninflammatory disorders of vagina: Secondary | ICD-10-CM

## 2014-04-18 DIAGNOSIS — K5909 Other constipation: Secondary | ICD-10-CM

## 2014-04-18 DIAGNOSIS — N9489 Other specified conditions associated with female genital organs and menstrual cycle: Secondary | ICD-10-CM

## 2014-04-18 MED ORDER — ESTRADIOL 1 MG PO TABS: ORAL_TABLET | ORAL | Status: DC

## 2014-04-18 MED ORDER — ESTROGENS, CONJUGATED 0.625 MG/GM VA CREA: TOPICAL_CREAM | VAGINAL | Status: DC

## 2014-04-18 MED ORDER — LINACLOTIDE 145 MCG PO CAPS: 145.0000 ug | ORAL_CAPSULE | Freq: Every day | ORAL | Status: DC

## 2014-04-18 MED ORDER — ZOLPIDEM TARTRATE 10 MG PO TABS: 10.0000 mg | ORAL_TABLET | Freq: Every evening | ORAL | Status: DC | PRN

## 2014-04-18 NOTE — Progress Notes (Signed)
   Subjective:    Patient ID: Karyn Brull, female    DOB: 1966-07-25, 48 y.o.   MRN: [id number]  HPI Patient in today c/o hotflashes- IS on estradiol- does not seem to be working anymore. Has history of rectal cancer. She also had a hysterectomy as well- Concerns about increasing meds due to cancer history. SHe also says that she is very moody. SOme pain with intercourse.  * troubke sleeping due to hot flashes- has tried [place] in the past which has helped.  Review of Systems  Constitutional: Negative.   HENT: Negative.   Respiratory: Negative.   Cardiovascular: Negative.   Genitourinary: Negative.   Neurological: Negative.   Psychiatric/Behavioral: Negative.   All other systems reviewed and are negative.      Objective:   Physical Exam  Constitutional: She is oriented to person, place, and time. She appears well-developed and well-nourished.  Cardiovascular: Normal rate, regular rhythm and normal heart sounds.   Pulmonary/Chest: Effort normal and breath sounds normal.  Genitourinary:  Pelvic exam deferred to GYN  Neurological: She is alert and oriented to person, place, and time.  Skin: Skin is warm.  Psychiatric: She has a normal mood and affect. Her behavior is normal. Judgment and thought content normal.   BP 116/77  Pulse 73  Temp(Src) 97.4 F (36.3 C) (Oral)  Ht 5\' 6"  (1.676 m)  Wt 204 lb (92.534 kg)  BMI 32.94 kg/m2        Assessment & Plan:  1. Constipation - functional Increase fiber in diet - Linaclotide (LINZESS) 145 MCG CAPS capsule; Take 1 capsule (145 mcg total) by mouth daily.  Dispense: 30 capsule; Refill: 6  2. Insomnia Bedtime ritual - zolpidem (AMBIEN) 10 MG tablet; Take 1 tablet (10 mg total) by mouth at bedtime as needed for sleep.  Dispense: 30 tablet; Refill: 1  3. Perimenopausal vasomotor symptoms Increase estradiol by 1mg  a day - estradiol (ESTRACE) 1 MG tablet; 1 po BID  Dispense: 60 tablet; Refill: 3  4. Vaginal dryness Use  lubrication prior to intercourse - conjugated estrogens (PREMARIN) vaginal cream; 3x a week  Dispense: 42.5 g; Refill: 12   Mary-Margaret Hassell Done, FNP

## 2014-04-18 NOTE — Patient Instructions (Signed)
Menopause Menopause is the normal time of life when menstrual periods stop completely. Menopause is complete when you have missed 12 consecutive menstrual periods. It usually occurs between the ages of 48 years and 55 years. Very rarely does a woman develop menopause before the age of 40 years. At menopause, your ovaries stop producing the female hormones estrogen and progesterone. This can cause undesirable symptoms and also affect your health. Sometimes the symptoms may occur 4-5 years before the menopause begins. There is no relationship between menopause and:  Oral contraceptives.  Number of children you had.  Race.  The age your menstrual periods started (menarche). Heavy smokers and very thin women may develop menopause earlier in life. CAUSES  The ovaries stop producing the female hormones estrogen and progesterone.  Other causes include:  Surgery to remove both ovaries.  The ovaries stop functioning for no known reason.  Tumors of the pituitary gland in the brain.  Medical disease that affects the ovaries and hormone production.  Radiation treatment to the abdomen or pelvis.  Chemotherapy that affects the ovaries. SYMPTOMS   Hot flashes.  Night sweats.  Decrease in sex drive.  Vaginal dryness and thinning of the vagina causing painful intercourse.  Dryness of the skin and developing wrinkles.  Headaches.  Tiredness.  Irritability.  Memory problems.  Weight gain.  Bladder infections.  Hair growth of the face and chest.  Infertility. More serious symptoms include:  Loss of bone (osteoporosis) causing breaks (fractures).  Depression.  Hardening and narrowing of the arteries (atherosclerosis) causing heart attacks and strokes. DIAGNOSIS   When the menstrual periods have stopped for 12 straight months.  Physical exam.  Hormone studies of the blood. TREATMENT  There are many treatment choices and nearly as many questions about them. The  decisions to treat or not to treat menopausal changes is an individual choice made with your health care provider. Your health care provider can discuss the treatments with you. Together, you can decide which treatment will work best for you. Your treatment choices may include:   Hormone therapy (estrogen and progesterone).  Non-hormonal medicines.  Treating the individual symptoms with medicine (for example antidepressants for depression).  Herbal medicines that may help specific symptoms.  Counseling by a psychiatrist or psychologist.  Group therapy.  Lifestyle changes including:  Eating healthy.  Regular exercise.  Limiting caffeine and alcohol.  Stress management and meditation.  No treatment. HOME CARE INSTRUCTIONS   Take the medicine your health care provider gives you as directed.  Get plenty of sleep and rest.  Exercise regularly.  Eat a diet that contains calcium (good for the bones) and soy products (acts like estrogen hormone).  Avoid alcoholic beverages.  Do not smoke.  If you have hot flashes, dress in layers.  Take supplements, calcium, and vitamin D to strengthen bones.  You can use over-the-counter lubricants or moisturizers for vaginal dryness.  Group therapy is sometimes very helpful.  Acupuncture may be helpful in some cases. SEEK MEDICAL CARE IF:   You are not sure you are in menopause.  You are having menopausal symptoms and need advice and treatment.  You are still having menstrual periods after age 55 years.  You have pain with intercourse.  Menopause is complete (no menstrual period for 12 months) and you develop vaginal bleeding.  You need a referral to a specialist (gynecologist, psychiatrist, or psychologist) for treatment. SEEK IMMEDIATE MEDICAL CARE IF:   You have severe depression.  You have excessive vaginal bleeding.    You fell and think you have a broken bone.  You have pain when you urinate.  You develop leg or  chest pain.  You have a fast pounding heart beat (palpitations).  You have severe headaches.  You develop vision problems.  You feel a lump in your breast.  You have abdominal pain or severe indigestion. Document Released: 10/22/2003 Document Revised: 04/03/2013 Document Reviewed: 02/28/2013 Exit[employer] Patient Information 2015 [employer]. This information is not intended to replace advice given to you by your health care provider. Make sure you discuss any questions you have with your health care provider.  

## 2014-05-02 ENCOUNTER — Emergency Department (HOSPITAL_COMMUNITY): Payer: Managed Care, Other (non HMO)

## 2014-05-02 ENCOUNTER — Encounter (HOSPITAL_COMMUNITY): Payer: Self-pay | Admitting: Emergency Medicine

## 2014-05-02 ENCOUNTER — Emergency Department (HOSPITAL_COMMUNITY)
Admission: EM | Admit: 2014-05-02 | Discharge: 2014-05-02 | Disposition: A | Discharge status: Home / Self Care | Payer: Managed Care, Other (non HMO) | Attending: Emergency Medicine | Admitting: Emergency Medicine

## 2014-05-02 DIAGNOSIS — F411 Generalized anxiety disorder: Secondary | ICD-10-CM | POA: Diagnosis not present

## 2014-05-02 DIAGNOSIS — Z85038 Personal history of other malignant neoplasm of large intestine: Secondary | ICD-10-CM | POA: Insufficient documentation

## 2014-05-02 DIAGNOSIS — Z791 Long term (current) use of non-steroidal anti-inflammatories (NSAID): Secondary | ICD-10-CM | POA: Insufficient documentation

## 2014-05-02 DIAGNOSIS — M5126 Other intervertebral disc displacement, lumbar region: Secondary | ICD-10-CM | POA: Diagnosis not present

## 2014-05-02 DIAGNOSIS — Z8719 Personal history of other diseases of the digestive system: Secondary | ICD-10-CM | POA: Insufficient documentation

## 2014-05-02 DIAGNOSIS — Z87442 Personal history of urinary calculi: Secondary | ICD-10-CM | POA: Insufficient documentation

## 2014-05-02 DIAGNOSIS — M549 Dorsalgia, unspecified: Secondary | ICD-10-CM | POA: Insufficient documentation

## 2014-05-02 DIAGNOSIS — IMO0002 Reserved for concepts with insufficient information to code with codable children: Secondary | ICD-10-CM | POA: Diagnosis not present

## 2014-05-02 DIAGNOSIS — Z79899 Other long term (current) drug therapy: Secondary | ICD-10-CM | POA: Insufficient documentation

## 2014-05-02 DIAGNOSIS — Z91040 Latex allergy status: Secondary | ICD-10-CM | POA: Insufficient documentation

## 2014-05-02 MED ORDER — OXYCODONE-ACETAMINOPHEN 5-325 MG PO TABS: 2.0000 | ORAL_TABLET | ORAL | Status: DC | PRN

## 2014-05-02 MED ORDER — HYDROCODONE-ACETAMINOPHEN 5-325 MG PO TABS
1.0000 | ORAL_TABLET | Freq: Once | ORAL | Status: AC
  Administered 2014-05-02: 1 via ORAL
  Filled 2014-05-02: qty 1

## 2014-05-02 MED ORDER — PREDNISONE 50 MG PO TABS: ORAL_TABLET | ORAL | Status: DC

## 2014-05-02 MED ORDER — SODIUM CHLORIDE 0.9 % IV BOLUS (SEPSIS)
500.0000 mL | Freq: Once | INTRAVENOUS | Status: AC
  Administered 2014-05-02: 500 mL via INTRAVENOUS

## 2014-05-02 MED ORDER — HYDROMORPHONE HCL 1 MG/ML IJ SOLN
0.5000 mg | Freq: Once | INTRAMUSCULAR | Status: AC
  Administered 2014-05-02: 0.5 mg via INTRAVENOUS
  Filled 2014-05-02: qty 1

## 2014-05-02 MED ORDER — NAPROXEN 500 MG PO TABS: 500.0000 mg | ORAL_TABLET | Freq: Two times a day (BID) | ORAL | Status: DC

## 2014-05-02 MED ORDER — KETOROLAC TROMETHAMINE 30 MG/ML IJ SOLN
30.0000 mg | Freq: Once | INTRAMUSCULAR | Status: AC
  Administered 2014-05-02: 30 mg via INTRAVENOUS
  Filled 2014-05-02: qty 1

## 2014-05-02 MED ORDER — ONDANSETRON 8 MG PO TBDP
8.0000 mg | ORAL_TABLET | Freq: Once | ORAL | Status: AC
  Administered 2014-05-02: 8 mg via ORAL
  Filled 2014-05-02: qty 1

## 2014-05-02 MED ORDER — ONDANSETRON HCL 4 MG/2ML IJ SOLN
4.0000 mg | Freq: Once | INTRAMUSCULAR | Status: AC
  Administered 2014-05-02: 4 mg via INTRAVENOUS
  Filled 2014-05-02: qty 2

## 2014-05-02 MED ORDER — CYCLOBENZAPRINE HCL 10 MG PO TABS
10.0000 mg | ORAL_TABLET | Freq: Once | ORAL | Status: AC
  Administered 2014-05-02: 10 mg via ORAL
  Filled 2014-05-02: qty 1

## 2014-05-02 NOTE — Discharge Instructions (Signed)
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  Will need to see a neurosurgeon.  [facility] [phone] will call for an appt.  Meds for pain, prednisone, inflammation

## 2014-05-02 NOTE — ED Notes (Addendum)
Pt given a hot pack & cold pack to apply to lower back. Family stated the hot pack made it worse last night so cold was applied.

## 2014-05-02 NOTE — ED Notes (Signed)
Pt reports lower back pain that started last night that runs down the left leg. Pt denies any injury or new activity.

## 2014-05-02 NOTE — ED Provider Notes (Signed)
CSN: [id number]     Arrival date & time 05/02/14  0604 History  This chart was scribed for Nat Christen, MD by Rayfield Citizen, ED Scribe. This patient was seen in room APA03/APA03 and the patient's care was started at 7:15 AM.     Chief Complaint  Patient presents with  . Back Pain   The history is provided by the patient. No language interpreter was used.    HPI Comments: [name] is a 48 y.o. female who presents to the Emergency Department complaining of complaining of gradual onset, constant, worsening left lateral hip pain that radiates down her left lateral thigh and to her left inguinal region that began yesterday. She notes associated paresthesia in her left foot. Patient reports a minor back ache beginning yesterday that gradually worsened overnight. She denies injury or trauma. Patient explains that she has difficulty ambulating due to pain; she is "dragging" her left leg and "cannot pick it up."   Her PCP is in [place].   Past Medical History  Diagnosis Date  . Blood transfusion 1995  . Kidney stone   . Constipation     SOME BLOOD IN STOOL  . Anxiety     NEW-DUE TO ANXIETY OVER DX OF CANCER  . GERD (gastroesophageal reflux disease)     occasionally-NO MEDS  . Cancer     RECTAL CANCER  . PONV (postoperative nausea and vomiting)     used a scop patch last surgery-was better   Past Surgical History  Procedure Laterality Date  . Abdominal hysterectomy  1995    partial  . Left ankle surgery for fx  several yrs ago  . Endoscopic left fallopian tube removed  several yrs ago  . Excision/release bursa hip  09/15/2011    Dr Tonita Cong EXCISION/RELEASE BURSA HIP;  Surgeon: Johnn Hai, MD;  Location: [facility];  Service: Orthopedics;  Laterality: Left;  Excision of Trochanteric Bursitis  . Colonoscopy  06/01/2012    Procedure: COLONOSCOPY;  Surgeon: Danie Binder, MD;  Location: [facility];  Service: Endoscopy;  Laterality: N/A;  1:30PM  . Eus  06/06/2012     Procedure: LOWER ENDOSCOPIC ULTRASOUND (EUS);  Surgeon: Arta Silence, MD;  Location: [facility];  Service: Endoscopy;  Laterality: N/A;  . Laparoscopic low anterior resection  07/02/2012    Procedure: LAPAROSCOPIC LOW ANTERIOR RESECTION;  Surgeon: Stark Klein, MD;  Location: [facility];  Service: General;  Laterality: N/A;  . Colon surgery    . Examination under anesthesia  09/07/2012    Procedure: EXAM UNDER ANESTHESIA;  Surgeon: Stark Klein, MD;  Location: [facility];  Service: General;  Laterality: N/A;  . Proctoscopy  09/07/2012    Procedure: PROCTOSCOPY;  Surgeon: Stark Klein, MD;  Location: [facility];  Service: General;  Laterality: N/A;  . Flexible sigmoidoscopy N/A 03/22/2013    Procedure: FLEXIBLE SIGMOIDOSCOPY;  Surgeon: Danie Binder, MD;  [facility];  Service: Endoscopy;  Laterality: N/A;  2:00  . Colonoscopy N/A 06/14/2013    Procedure: COLONOSCOPY;  Surgeon: Danie Binder, MD;  [facility];  Service: Endoscopy;  Laterality: N/A;  1:30 PM-moved to 12:45 Melanie notified pt   Family History  Problem Relation Age of Onset  . Other Sister   . Colon cancer Paternal Aunt    History  Substance Use Topics  . Smoking status: Never Smoker   . Smokeless tobacco: Never Used  . Alcohol Use: Yes  Comment: occasional couple times per mo   OB History   Grav Para Term Preterm Abortions TAB SAB Ect Mult Living                 Review of Systems   A complete 10 system review of systems was obtained and all systems are negative except as noted in the HPI and PMH.   Allergies  Codeine; Ultram; and Latex  Home Medications   Prior to Admission medications   Medication Sig Start Date End Date Taking? Authorizing Provider  Carboxymethylcellulose Sodium (THERATEARS OP) Apply 1 drop to eye daily as needed (for dry eyes).   Yes Historical Provider, MD  estradiol (ESTRACE) 1 MG tablet 1 po BID 04/18/14  Yes Mary-Margaret Hassell Done, FNP   ibuprofen (ADVIL,MOTRIN) 200 MG tablet Take 800 mg by mouth daily as needed for headache or moderate pain.   Yes Historical Provider, MD  Linaclotide Rolan Lipa) 145 MCG CAPS capsule Take 1 capsule (145 mcg total) by mouth daily. 04/18/14  Yes Mary-Margaret Hassell Done, FNP  zolpidem (AMBIEN) 10 MG tablet Take 1 tablet (10 mg total) by mouth at bedtime as needed for sleep. 04/18/14 05/18/14 Yes Mary-Margaret Hassell Done, FNP  naproxen (NAPROSYN) 500 MG tablet Take 1 tablet (500 mg total) by mouth 2 (two) times daily. 05/02/14   Nat Christen, MD  oxyCODONE-acetaminophen (PERCOCET) 5-325 MG per tablet Take 2 tablets by mouth every 4 (four) hours as needed. 05/02/14   Nat Christen, MD  predniSONE (DELTASONE) 50 MG tablet One tablet daily for 6 days 05/02/14   Nat Christen, MD   BP 109/58  Pulse 69  Temp(Src) 98.1 F (36.7 C)  Resp 16  Ht 5\' 6"  (1.676 m)  Wt 200 lb (90.719 kg)  BMI 32.30 kg/m2  SpO2 100% Physical Exam  Nursing note and vitals reviewed. Constitutional: She is oriented to person, place, and time. She appears well-developed and well-nourished.  HENT:  Head: Normocephalic and atraumatic.  Eyes: Conjunctivae and EOM are normal. Pupils are equal, round, and reactive to light.  Neck: Normal range of motion. Neck supple.  Cardiovascular: Normal rate, regular rhythm and normal heart sounds.   Pulmonary/Chest: Effort normal and breath sounds normal.  Abdominal: Soft. Bowel sounds are normal.  Musculoskeletal: Normal range of motion.  Lower back is nontender  Neurological: She is alert and oriented to person, place, and time.  Pain with left straight leg raise   Skin: Skin is warm and dry.  Psychiatric: She has a normal mood and affect. Her behavior is normal.    ED Course  Procedures  DIAGNOSTIC STUDIES: Oxygen Saturation is 98% on RA, normal by my interpretation.    COORDINATION OF CARE:  7:18 AM Discussed clinical suspicion of nerve pain; patient will receive pain medication while the x-ray is  analyzed. Patient agreed to plan.   Labs Review Labs Reviewed - No data to display  Imaging Review Dg Lumbar Spine Complete  05/02/2014   CLINICAL DATA:  Low back pain without injury  EXAM: LUMBAR SPINE - COMPLETE 4+ VIEW  COMPARISON:  None.  FINDINGS: Vertebral body height is well maintained. Mild osteophytic changes are seen. No spondylolysis or spondylolisthesis is seen. Scattered fecal material is noted throughout the colon. No soft tissue abnormality is noted.  IMPRESSION: Mild degenerative change without acute abnormality.   Electronically Signed   By: Inez Catalina M.D.   On: 05/02/2014 07:27   Mr Lumbar Spine Wo Contrast  05/02/2014   CLINICAL DATA:  Low back and  left leg pain.  EXAM: MRI LUMBAR SPINE WITHOUT CONTRAST  TECHNIQUE: Multiplanar, multisequence MR imaging of the lumbar spine was performed. No intravenous contrast was administered.  COMPARISON:  Lumbar radiographs 05/02/2014.  FINDINGS: Normal alignment of the lumbar vertebral bodies. They demonstrate normal marrow signal except for mild endplate reactive changes at L4-5 and a large L1 hemangioma. The conus medullaris terminates at the bottom of L1. The facets are normally aligned. No pars defects. No significant paraspinal or retroperitoneal findings.  L1-2:  No significant findings.  L2-3: Moderate sized broad-based right paracentral and right foraminal disc protrusion displacing the right L2 nerve root. There is also lateral recess encroachment on the right L3 nerve root. No spinal stenosis.  L3-4: Broad-based foraminal and extra foraminal disc protrusion on the left with an extruded disc fragment up behind the L3 vertebral body. This could be a free fragment. There is mass effect on the left side of the thecal sac and on the left L3 nerve root in the neural foramen.  L4-5: Shallow broad-based foraminal and extra foraminal disc protrusion on the right which appears to contact and slightly displace the right L4 nerve root. Mild facet  disease. No spinal stenosis.  L5-S1:  No significant findings.  IMPRESSION: Right paracentral and right foraminal disc protrusion at L2-3 likely effecting both the right L2 and L3 nerve roots.  Foraminal and extra foraminal disc protrusion on the left at L3-4 with an extruded disc fragment coursing up behind the L3 vertebral body.  Shallow broad-based foraminal and extra foraminal disc protrusion on the right at L4-5.   Electronically Signed   By: Kalman Jewels M.D.   On: 05/02/2014 12:03     EKG Interpretation None      MDM   Final diagnoses:  Herniated nucleus pulposus, L3-4 left  Herniated nucleus pulposus, L2-3 right  Herniated nucleus pulposus, L4-5 right   MRI reveals herniated nucleus pulposus at left L3-4, right L2-3, right L4-5.   No bowel or bladder incontinence.   Will followup with me and our neurosurgery. Discharge medications Percocet, Naprosyn 500 mg, prednisone.   I personally performed the services described in this documentation, which was scribed in my presence. The recorded information has been reviewed and is accurate.      Nat Christen, MD 05/02/14 [id number]

## 2014-05-02 NOTE — ED Notes (Signed)
Pt alert & oriented x4, stable gait. Patient given discharge instructions, paperwork & prescription(s). Patient  instructed to stop at the registration desk to finish any additional paperwork. Patient verbalized understanding. Pt left department w/ no further questions. 

## 2014-05-02 NOTE — ED Notes (Signed)
Pt given ice pack for lower back

## 2014-05-06 ENCOUNTER — Ambulatory Visit: Payer: Self-pay | Admitting: Orthopedic Surgery

## 2014-05-07 ENCOUNTER — Ambulatory Visit: Payer: Self-pay | Admitting: Orthopedic Surgery

## 2014-05-07 ENCOUNTER — Encounter (HOSPITAL_COMMUNITY): Payer: Self-pay | Admitting: Pharmacy Technician

## 2014-05-08 ENCOUNTER — Other Ambulatory Visit: Payer: Self-pay | Admitting: Nurse Practitioner

## 2014-05-08 ENCOUNTER — Telehealth: Payer: Self-pay | Admitting: Family Medicine

## 2014-05-08 MED ORDER — NYSTATIN 100000 UNIT/ML MT SUSP: 5.0000 mL | Freq: Four times a day (QID) | OROMUCOSAL | Status: DC

## 2014-05-08 NOTE — Telephone Encounter (Signed)
Patient aware.

## 2014-05-08 NOTE — Telephone Encounter (Signed)
rx sent to pharmacy

## 2014-05-08 NOTE — Telephone Encounter (Signed)
Patient has thrush and they cant get her to come out because she is in so much pain she is due to have surgery next week can you please call something in?

## 2014-05-09 ENCOUNTER — Ambulatory Visit (INDEPENDENT_AMBULATORY_CARE_PROVIDER_SITE_OTHER): Payer: Managed Care, Other (non HMO) | Admitting: General Surgery

## 2014-05-12 ENCOUNTER — Encounter (HOSPITAL_COMMUNITY)
Admission: RE | Admit: 2014-05-12 | Discharge: 2014-05-12 | Disposition: A | Discharge status: Home / Self Care | Payer: Managed Care, Other (non HMO) | Source: Ambulatory Visit | Attending: Specialist | Admitting: Specialist

## 2014-05-12 ENCOUNTER — Encounter (HOSPITAL_COMMUNITY): Payer: Self-pay

## 2014-05-12 ENCOUNTER — Ambulatory Visit (HOSPITAL_COMMUNITY)
Admission: RE | Admit: 2014-05-12 | Discharge: 2014-05-12 | Disposition: A | Discharge status: Home / Self Care | Payer: Managed Care, Other (non HMO) | Source: Ambulatory Visit | Attending: Orthopedic Surgery | Admitting: Orthopedic Surgery

## 2014-05-12 DIAGNOSIS — M4120 Other idiopathic scoliosis, site unspecified: Secondary | ICD-10-CM | POA: Insufficient documentation

## 2014-05-12 DIAGNOSIS — Z01818 Encounter for other preprocedural examination: Secondary | ICD-10-CM | POA: Diagnosis not present

## 2014-05-12 LAB — CBC
HCT: 45.3 % (ref 36.0–46.0)
HEMOGLOBIN: 14.8 g/dL (ref 12.0–15.0)
MCH: 30.3 pg (ref 26.0–34.0)
MCHC: 32.7 g/dL (ref 30.0–36.0)
MCV: 92.6 fL (ref 78.0–100.0)
Platelets: 243 10*3/uL (ref 150–400)
RBC: 4.89 MIL/uL (ref 3.87–5.11)
RDW: 12.9 % (ref 11.5–15.5)
WBC: 7 10*3/uL (ref 4.0–10.5)

## 2014-05-12 LAB — URINALYSIS, ROUTINE W REFLEX MICROSCOPIC
Bilirubin Urine: NEGATIVE
Glucose, UA: NEGATIVE mg/dL
Hgb urine dipstick: NEGATIVE
KETONES UR: NEGATIVE mg/dL
Leukocytes, UA: NEGATIVE
NITRITE: NEGATIVE
PROTEIN: NEGATIVE mg/dL
Specific Gravity, Urine: 1.007 (ref 1.005–1.030)
UROBILINOGEN UA: 0.2 mg/dL (ref 0.0–1.0)
pH: 7.5 (ref 5.0–8.0)

## 2014-05-12 LAB — BASIC METABOLIC PANEL
ANION GAP: 12 (ref 5–15)
BUN: 7 mg/dL (ref 6–23)
CHLORIDE: 98 meq/L (ref 96–112)
CO2: 29 mEq/L (ref 19–32)
Calcium: 9.8 mg/dL (ref 8.4–10.5)
Creatinine, Ser: 0.73 mg/dL (ref 0.50–1.10)
GFR calc non Af Amer: >90 mL/min (ref >90)
Glucose, Bld: 95 mg/dL (ref 70–99)
Potassium: 4.5 mEq/L (ref 3.7–5.3)
Sodium: 139 mEq/L (ref 137–147)

## 2014-05-12 LAB — SURGICAL PCR SCREEN
MRSA, PCR: NEGATIVE
STAPHYLOCOCCUS AUREUS: NEGATIVE

## 2014-05-12 NOTE — Patient Instructions (Addendum)
20     Your procedure is scheduled on:  Wednesday 05/14/2014  Report to [facility] Main Entrance and follow signs to Short Stay  at  0530  AM.  Call this number if you have problems the night before or morning of surgery:   [phone]   Remember:          Do not eat food or drink liquids AFTER MIDNIGHT!  Take these medicines the morning of surgery with A SIP OF WATER: NONE    [facility] IS NOT RESPONSIBLE FOR ANY BELONGINGS OR VALUABLES BROUGHT TO HOSPITAL.  Marland Kitchen  Leave suitcase in the car. After surgery it may be brought to your room.  For patients admitted to the hospital, checkout time is 11:00 AM the day of              Discharge.    DO NOT WEAR JEWELRY,MAKE-UP,LOTIONS,POWDERS,PERFUMES,CONTACTS , DENTURES OR BRIDGEWORK ,AND DO NOT WEAR FALSE EYELASHES                                    Patients discharged the day of surgery will not be allowed to drive home.  If going home the same day of surgery, must have someone stay with you   first 24 hrs.at home and arrange for someone to drive you home from the [place] IS:N/A   Special Instructions:              Please read over the following fact sheets that you were given:             1. [facility] - Preparing for Surgery Before surgery, you can play an important role.  Because skin is not sterile, your skin needs to be as free of germs as possible.  You can reduce the number of germs on your skin by washing with CHG (chlorahexidine gluconate) soap before surgery.  CHG is an antiseptic cleaner which kills germs and bonds with the skin to continue killing germs even after washing. Please DO NOT use if you have an allergy to CHG or antibacterial soaps.  If your skin becomes reddened/irritated stop using the CHG and inform your nurse when you arrive at Short  Stay. Do not shave (including legs and underarms) for at least 48 hours prior to the first CHG shower.  You may shave your face/neck. Please follow these instructions carefully:  1.  Shower with CHG Soap the night before surgery and the  morning of Surgery.  2.  If you choose to wash your hair, wash your hair first as usual with your  normal  shampoo.  3.  After you shampoo, rinse your hair and body thoroughly to remove the  shampoo.  4.  Use CHG as you would any other liquid soap.  You can apply chg directly  to the skin and wash                       Gently with a scrungie or clean washcloth.  5.  Apply the CHG Soap to your body ONLY FROM THE NECK DOWN.   Do not use on face/ open                           Wound or open sores. Avoid contact with eyes, ears mouth and genitals (private parts).                       Wash face,  Genitals (private parts) with your normal soap.             6.  Wash thoroughly, paying special attention to the area where your surgery  will be performed.  7.  Thoroughly rinse your body with warm water from the neck down.  8.  DO NOT shower/wash with your normal soap after using and rinsing off  the CHG Soap.                9.  Pat yourself dry with a clean towel.            10.  Wear clean pajamas.            11.  Place clean sheets on your bed the night of your first shower and do not  sleep with pets. Day of Surgery : Do not apply any lotions/deodorants the morning of surgery.  Please wear clean clothes to the hospital/surgery center.  FAILURE TO FOLLOW THESE INSTRUCTIONS MAY RESULT IN THE CANCELLATION OF YOUR SURGERY PATIENT SIGNATURE_________________________________  NURSE SIGNATURE__________________________________  ________________________________________________________________________   [name]  An incentive spirometer is a tool that can help keep your lungs clear and active. This tool measures how well you are  filling your lungs with each breath. Taking long deep breaths may help reverse or decrease the chance of developing breathing (pulmonary) problems (especially infection) following:  A long period of time when you are unable to move or be active. BEFORE THE PROCEDURE   If the spirometer includes an indicator to show your best effort, your nurse or respiratory therapist will set it to a desired goal.  If possible, sit up straight or lean slightly forward. Try not to slouch.  Hold the incentive spirometer in an upright position. INSTRUCTIONS FOR USE  1. Sit on the edge of your bed if possible, or sit up as far as you can in bed or on a chair. 2. Hold the incentive spirometer in an upright position. 3. Breathe out normally. 4. Place the mouthpiece in your mouth and seal your lips tightly around it. 5. Breathe in slowly and as deeply as possible, raising the piston or the ball toward the top of the column. 6. Hold your breath for 3-5 seconds or for as long as possible. Allow the piston or ball to fall to the bottom of the column. 7. Remove the mouthpiece from your mouth and breathe out normally. 8. Rest for a few seconds and repeat Steps 1 through 7 at least 10 times every 1-2 hours when you are awake. Take your time and take a few normal breaths between deep breaths. 9. The spirometer may include an indicator to show  your best effort. Use the indicator as a goal to work toward during each repetition. 10. After each set of 10 deep breaths, practice coughing to be sure your lungs are clear. If you have an incision (the cut made at the time of surgery), support your incision when coughing by placing a pillow or rolled up towels firmly against it. Once you are able to get out of bed, walk around indoors and cough well. You may stop using the incentive spirometer when instructed by your caregiver.  RISKS AND COMPLICATIONS  Take your time so you do not get dizzy or light-headed.  If you are in pain,  you may need to take or ask for pain medication before doing incentive spirometry. It is harder to take a deep breath if you are having pain. AFTER USE  Rest and breathe slowly and easily.  It can be helpful to keep track of a log of your progress. Your caregiver can provide you with a simple table to help with this. If you are using the spirometer at home, follow these instructions: [place] IF:   You are having difficultly using the spirometer.  You have trouble using the spirometer as often as instructed.  Your pain medication is not giving enough relief while using the spirometer.  You develop fever of 100.5 F (38.1 C) or higher. SEEK IMMEDIATE MEDICAL CARE IF:   You cough up bloody sputum that had not been present before.  You develop fever of 102 F (38.9 C) or greater.  You develop worsening pain at or near the incision site. MAKE SURE YOU:   Understand these instructions.  Will watch your condition.  Will get help right away if you are not doing well or get worse. Document Released: 12/12/2006 Document Revised: 10/24/2011 Document Reviewed: 02/12/2007 [facility] Patient Information 2014 [place], [place].   ________________________________________________________________________

## 2014-05-14 ENCOUNTER — Ambulatory Visit (HOSPITAL_COMMUNITY): Payer: Managed Care, Other (non HMO)

## 2014-05-14 ENCOUNTER — Ambulatory Visit (HOSPITAL_COMMUNITY)
Admission: RE | Admit: 2014-05-14 | Discharge: 2014-05-15 | Disposition: A | Discharge status: Home / Self Care | Payer: Managed Care, Other (non HMO) | Source: Ambulatory Visit | Attending: Specialist | Admitting: Specialist

## 2014-05-14 ENCOUNTER — Ambulatory Visit: Payer: Self-pay | Admitting: Orthopedic Surgery

## 2014-05-14 ENCOUNTER — Encounter (HOSPITAL_COMMUNITY): Payer: Managed Care, Other (non HMO) | Admitting: Anesthesiology

## 2014-05-14 ENCOUNTER — Encounter (HOSPITAL_COMMUNITY)
Admission: RE | Disposition: A | Discharge status: Home / Self Care | Payer: Self-pay | Source: Ambulatory Visit | Attending: Specialist

## 2014-05-14 ENCOUNTER — Ambulatory Visit (HOSPITAL_COMMUNITY): Payer: Managed Care, Other (non HMO) | Admitting: Anesthesiology

## 2014-05-14 ENCOUNTER — Encounter (HOSPITAL_COMMUNITY): Payer: Self-pay | Admitting: *Deleted

## 2014-05-14 DIAGNOSIS — M5126 Other intervertebral disc displacement, lumbar region: Secondary | ICD-10-CM | POA: Diagnosis present

## 2014-05-14 DIAGNOSIS — F411 Generalized anxiety disorder: Secondary | ICD-10-CM | POA: Diagnosis not present

## 2014-05-14 DIAGNOSIS — K219 Gastro-esophageal reflux disease without esophagitis: Secondary | ICD-10-CM | POA: Diagnosis not present

## 2014-05-14 DIAGNOSIS — F419 Anxiety disorder, unspecified: Secondary | ICD-10-CM | POA: Insufficient documentation

## 2014-05-14 DIAGNOSIS — M4120 Other idiopathic scoliosis, site unspecified: Secondary | ICD-10-CM | POA: Diagnosis not present

## 2014-05-14 DIAGNOSIS — M419 Scoliosis, unspecified: Secondary | ICD-10-CM | POA: Insufficient documentation

## 2014-05-14 HISTORY — PX: LUMBAR LAMINECTOMY/DECOMPRESSION MICRODISCECTOMY: SHX5026

## 2014-05-14 SURGERY — LUMBAR LAMINECTOMY/DECOMPRESSION MICRODISCECTOMY 2 LEVELS
Anesthesia: General | Site: Back

## 2014-05-14 MED ORDER — HYDROMORPHONE HCL 1 MG/ML IJ SOLN
INTRAMUSCULAR | Status: AC
  Filled 2014-05-14: qty 1

## 2014-05-14 MED ORDER — SCOPOLAMINE 1 MG/3DAYS TD PT72
MEDICATED_PATCH | TRANSDERMAL | Status: DC | PRN
  Administered 2014-05-14: 1 via TRANSDERMAL

## 2014-05-14 MED ORDER — MENTHOL 3 MG MT LOZG
1.0000 | LOZENGE | OROMUCOSAL | Status: DC | PRN
  Filled 2014-05-14: qty 9

## 2014-05-14 MED ORDER — MIDAZOLAM HCL 2 MG/2ML IJ SOLN
INTRAMUSCULAR | Status: AC
  Filled 2014-05-14: qty 2

## 2014-05-14 MED ORDER — DEXAMETHASONE SODIUM PHOSPHATE 10 MG/ML IJ SOLN
INTRAMUSCULAR | Status: DC | PRN
  Administered 2014-05-14: 10 mg via INTRAVENOUS

## 2014-05-14 MED ORDER — CEFAZOLIN SODIUM-DEXTROSE 2-3 GM-% IV SOLR
2.0000 g | INTRAVENOUS | Status: AC
  Administered 2014-05-14: 2 g via INTRAVENOUS

## 2014-05-14 MED ORDER — PANTOPRAZOLE SODIUM 40 MG IV SOLR
40.0000 mg | Freq: Every day | INTRAVENOUS | Status: DC
  Filled 2014-05-14: qty 40

## 2014-05-14 MED ORDER — POLYVINYL ALCOHOL 1.4 % OP SOLN
1.0000 [drp] | Freq: Every morning | OPHTHALMIC | Status: DC
  Administered 2014-05-14 – 2014-05-15 (×2): 1 [drp] via OPHTHALMIC
  Filled 2014-05-14: qty 15

## 2014-05-14 MED ORDER — ALUM & MAG HYDROXIDE-SIMETH 200-200-20 MG/5ML PO SUSP
30.0000 mL | Freq: Four times a day (QID) | ORAL | Status: DC | PRN
Start: 2014-05-14 — End: 2014-05-15

## 2014-05-14 MED ORDER — DEXTROSE 5 % IV SOLN
500.0000 mg | Freq: Four times a day (QID) | INTRAVENOUS | Status: DC | PRN
  Administered 2014-05-14: 500 mg via INTRAVENOUS
  Filled 2014-05-14: qty 5

## 2014-05-14 MED ORDER — PROPOFOL 10 MG/ML IV BOLUS
INTRAVENOUS | Status: AC
  Filled 2014-05-14: qty 20

## 2014-05-14 MED ORDER — OXYCODONE-ACETAMINOPHEN 5-325 MG PO TABS: 1.0000 | ORAL_TABLET | ORAL | Status: DC | PRN

## 2014-05-14 MED ORDER — ONDANSETRON HCL 4 MG/2ML IJ SOLN
INTRAMUSCULAR | Status: AC
  Filled 2014-05-14: qty 2

## 2014-05-14 MED ORDER — FENTANYL CITRATE 0.05 MG/ML IJ SOLN
INTRAMUSCULAR | Status: DC | PRN
  Administered 2014-05-14: 100 ug via INTRAVENOUS
  Administered 2014-05-14 (×2): 50 ug via INTRAVENOUS

## 2014-05-14 MED ORDER — DEXAMETHASONE SODIUM PHOSPHATE 10 MG/ML IJ SOLN
INTRAMUSCULAR | Status: AC
  Filled 2014-05-14: qty 1

## 2014-05-14 MED ORDER — ZOLPIDEM TARTRATE 10 MG PO TABS: 5.0000 mg | ORAL_TABLET | Freq: Every evening | ORAL | Status: DC | PRN

## 2014-05-14 MED ORDER — KCL IN DEXTROSE-NACL 20-5-0.45 MEQ/L-%-% IV SOLN
INTRAVENOUS | Status: AC
  Administered 2014-05-14: 13:00:00 via INTRAVENOUS
  Filled 2014-05-14 (×3): qty 1000

## 2014-05-14 MED ORDER — SODIUM CHLORIDE 0.9 % IR SOLN
Status: DC | PRN
  Administered 2014-05-14: 08:00:00

## 2014-05-14 MED ORDER — HYDROMORPHONE HCL 1 MG/ML IJ SOLN
INTRAMUSCULAR | Status: DC
Start: 2014-05-14 — End: 2014-05-14
  Filled 2014-05-14: qty 1

## 2014-05-14 MED ORDER — DOXYCYCLINE HYCLATE 100 MG PO CAPS: 100.0000 mg | ORAL_CAPSULE | Freq: Two times a day (BID) | ORAL | Status: DC

## 2014-05-14 MED ORDER — ACETAMINOPHEN 650 MG RE SUPP: 650.0000 mg | RECTAL | Status: DC | PRN

## 2014-05-14 MED ORDER — FENTANYL CITRATE 0.05 MG/ML IJ SOLN
INTRAMUSCULAR | Status: AC
  Filled 2014-05-14: qty 5

## 2014-05-14 MED ORDER — PANTOPRAZOLE SODIUM 40 MG PO TBEC
40.0000 mg | DELAYED_RELEASE_TABLET | Freq: Every day | ORAL | Status: DC
  Administered 2014-05-14 – 2014-05-15 (×2): 40 mg via ORAL
  Filled 2014-05-14: qty 1

## 2014-05-14 MED ORDER — CEFAZOLIN SODIUM-DEXTROSE 2-3 GM-% IV SOLR
2.0000 g | Freq: Three times a day (TID) | INTRAVENOUS | Status: AC
  Administered 2014-05-14 – 2014-05-15 (×3): 2 g via INTRAVENOUS
  Filled 2014-05-14 (×3): qty 50

## 2014-05-14 MED ORDER — CEFAZOLIN SODIUM-DEXTROSE 2-3 GM-% IV SOLR
INTRAVENOUS | Status: AC
  Filled 2014-05-14: qty 50

## 2014-05-14 MED ORDER — SODIUM CHLORIDE 0.9 % IJ SOLN: 3.0000 mL | INTRAMUSCULAR | Status: DC | PRN

## 2014-05-14 MED ORDER — HYDROCODONE-ACETAMINOPHEN 5-325 MG PO TABS
1.0000 | ORAL_TABLET | ORAL | Status: DC | PRN
  Administered 2014-05-14 – 2014-05-15 (×6): 2 via ORAL
  Filled 2014-05-14 (×6): qty 2

## 2014-05-14 MED ORDER — THROMBIN 5000 UNITS EX SOLR
CUTANEOUS | Status: DC | PRN
  Administered 2014-05-14: 10000 [IU] via TOPICAL

## 2014-05-14 MED ORDER — DOXYCYCLINE HYCLATE 100 MG PO TABS
100.0000 mg | ORAL_TABLET | Freq: Two times a day (BID) | ORAL | Status: DC
  Administered 2014-05-14: 100 mg via ORAL
  Filled 2014-05-14 (×4): qty 1

## 2014-05-14 MED ORDER — MEPERIDINE HCL 50 MG/ML IJ SOLN: 6.2500 mg | INTRAMUSCULAR | Status: DC | PRN

## 2014-05-14 MED ORDER — ACETAMINOPHEN 325 MG PO TABS: 650.0000 mg | ORAL_TABLET | ORAL | Status: DC | PRN

## 2014-05-14 MED ORDER — PHENOL 1.4 % MT LIQD
1.0000 | OROMUCOSAL | Status: DC | PRN
  Filled 2014-05-14: qty 177

## 2014-05-14 MED ORDER — SODIUM CHLORIDE 0.9 % IJ SOLN
3.0000 mL | Freq: Two times a day (BID) | INTRAMUSCULAR | Status: DC
  Administered 2014-05-15: 3 mL via INTRAVENOUS

## 2014-05-14 MED ORDER — SODIUM CHLORIDE 0.9 % IV SOLN: 250.0000 mL | INTRAVENOUS | Status: DC

## 2014-05-14 MED ORDER — ONDANSETRON HCL 4 MG/2ML IJ SOLN
INTRAMUSCULAR | Status: DC | PRN
  Administered 2014-05-14: 4 mg via INTRAVENOUS

## 2014-05-14 MED ORDER — MIDAZOLAM HCL 5 MG/5ML IJ SOLN
INTRAMUSCULAR | Status: DC | PRN
  Administered 2014-05-14: 2 mg via INTRAVENOUS

## 2014-05-14 MED ORDER — LACTATED RINGERS IV SOLN
INTRAVENOUS | Status: DC
  Administered 2014-05-14: 1000 mL via INTRAVENOUS
  Administered 2014-05-14: 07:00:00 via INTRAVENOUS

## 2014-05-14 MED ORDER — METHOCARBAMOL 500 MG PO TABS
500.0000 mg | ORAL_TABLET | Freq: Four times a day (QID) | ORAL | Status: DC | PRN
  Administered 2014-05-14 – 2014-05-15 (×2): 500 mg via ORAL
  Filled 2014-05-14 (×2): qty 1

## 2014-05-14 MED ORDER — METHOCARBAMOL 500 MG PO TABS: 500.0000 mg | ORAL_TABLET | Freq: Three times a day (TID) | ORAL | Status: DC | PRN

## 2014-05-14 MED ORDER — SCOPOLAMINE 1 MG/3DAYS TD PT72
MEDICATED_PATCH | TRANSDERMAL | Status: AC
  Filled 2014-05-14: qty 1

## 2014-05-14 MED ORDER — SODIUM CHLORIDE 0.9 % IR SOLN
Status: AC
  Filled 2014-05-14: qty 1

## 2014-05-14 MED ORDER — THROMBIN 5000 UNITS EX SOLR
CUTANEOUS | Status: AC
  Filled 2014-05-14: qty 10000

## 2014-05-14 MED ORDER — NYSTATIN 100000 UNIT/ML MT SUSP
5.0000 mL | Freq: Four times a day (QID) | OROMUCOSAL | Status: DC
  Administered 2014-05-14 – 2014-05-15 (×4): 500000 [IU] via ORAL
  Filled 2014-05-14 (×8): qty 5

## 2014-05-14 MED ORDER — DOCUSATE SODIUM 100 MG PO CAPS
100.0000 mg | ORAL_CAPSULE | Freq: Two times a day (BID) | ORAL | Status: DC
  Administered 2014-05-14 – 2014-05-15 (×2): 100 mg via ORAL

## 2014-05-14 MED ORDER — OXYCODONE-ACETAMINOPHEN 7.5-325 MG PO TABS: 1.0000 | ORAL_TABLET | ORAL | Status: DC | PRN

## 2014-05-14 MED ORDER — HYDROMORPHONE HCL 1 MG/ML IJ SOLN
0.5000 mg | INTRAMUSCULAR | Status: DC | PRN
  Administered 2014-05-14: 1 mg via INTRAVENOUS
  Filled 2014-05-14: qty 1

## 2014-05-14 MED ORDER — PROMETHAZINE HCL 25 MG/ML IJ SOLN: 6.2500 mg | INTRAMUSCULAR | Status: DC | PRN

## 2014-05-14 MED ORDER — DOCUSATE SODIUM 100 MG PO CAPS: 100.0000 mg | ORAL_CAPSULE | Freq: Two times a day (BID) | ORAL | Status: DC | PRN

## 2014-05-14 MED ORDER — SUCCINYLCHOLINE CHLORIDE 20 MG/ML IJ SOLN
INTRAMUSCULAR | Status: DC | PRN
  Administered 2014-05-14: 100 mg via INTRAVENOUS

## 2014-05-14 MED ORDER — LINACLOTIDE 145 MCG PO CAPS
145.0000 ug | ORAL_CAPSULE | ORAL | Status: DC
  Administered 2014-05-14: 145 ug via ORAL
  Filled 2014-05-14 (×4): qty 1

## 2014-05-14 MED ORDER — METOCLOPRAMIDE HCL 5 MG/ML IJ SOLN
INTRAMUSCULAR | Status: AC
  Filled 2014-05-14: qty 2

## 2014-05-14 MED ORDER — BUPIVACAINE-EPINEPHRINE (PF) 0.5% -1:200000 IJ SOLN
INTRAMUSCULAR | Status: DC | PRN
  Administered 2014-05-14: 17 mL

## 2014-05-14 MED ORDER — METOCLOPRAMIDE HCL 5 MG/ML IJ SOLN
INTRAMUSCULAR | Status: DC | PRN
  Administered 2014-05-14: 10 mg via INTRAVENOUS

## 2014-05-14 MED ORDER — HYDROMORPHONE HCL 1 MG/ML IJ SOLN
0.2500 mg | INTRAMUSCULAR | Status: DC | PRN
  Administered 2014-05-14 (×4): 0.5 mg via INTRAVENOUS

## 2014-05-14 MED ORDER — ONDANSETRON HCL 4 MG/2ML IJ SOLN: 4.0000 mg | INTRAMUSCULAR | Status: DC | PRN

## 2014-05-14 MED ORDER — ESTRADIOL 1 MG PO TABS
1.0000 mg | ORAL_TABLET | Freq: Two times a day (BID) | ORAL | Status: DC
  Administered 2014-05-14 – 2014-05-15 (×3): 1 mg via ORAL
  Filled 2014-05-14 (×4): qty 1

## 2014-05-14 MED ORDER — PROPOFOL 10 MG/ML IV BOLUS
INTRAVENOUS | Status: DC | PRN
  Administered 2014-05-14: 170 mg via INTRAVENOUS

## 2014-05-14 MED ORDER — BUPIVACAINE-EPINEPHRINE (PF) 0.5% -1:200000 IJ SOLN
INTRAMUSCULAR | Status: AC
  Filled 2014-05-14: qty 30

## 2014-05-14 SURGICAL SUPPLY — 49 items
BAG SPEC THK2 15X12 ZIP CLS (MISCELLANEOUS)
BAG ZIPLOCK 12X15 (MISCELLANEOUS) IMPLANT
CHLORAPREP W/TINT 26ML (MISCELLANEOUS) IMPLANT
CLEANER TIP ELECTROSURG 2X2 (MISCELLANEOUS) ×2 IMPLANT
CLOTH 2% CHLOROHEXIDINE 3PK (PERSONAL CARE ITEMS) ×2 IMPLANT
DRAPE MICROSCOPE LEICA (MISCELLANEOUS) ×2 IMPLANT
DRAPE POUCH INSTRU U-SHP 10X18 (DRAPES) ×2 IMPLANT
DRAPE SURG 17X11 SM STRL (DRAPES) ×2 IMPLANT
DRAPE UTILITY XL STRL (DRAPES) ×2 IMPLANT
DRSG AQUACEL AG ADV 3.5X 4 (GAUZE/BANDAGES/DRESSINGS) IMPLANT
DRSG AQUACEL AG ADV 3.5X 6 (GAUZE/BANDAGES/DRESSINGS) ×1 IMPLANT
DURAPREP 26ML APPLICATOR (WOUND CARE) ×2 IMPLANT
DURASEAL SPINE SEALANT 3ML (MISCELLANEOUS) IMPLANT
ELECT BLADE TIP CTD 4 INCH (ELECTRODE) IMPLANT
ELECT REM PT RETURN 9FT ADLT (ELECTROSURGICAL) ×2
ELECTRODE REM PT RTRN 9FT ADLT (ELECTROSURGICAL) ×1 IMPLANT
GLOVE BIOGEL PI IND STRL 7.5 (GLOVE) ×2 IMPLANT
GLOVE BIOGEL PI IND STRL 8 (GLOVE) IMPLANT
GLOVE BIOGEL PI INDICATOR 7.5 (GLOVE) ×2
GLOVE BIOGEL PI INDICATOR 8 (GLOVE) ×2
GLOVE SURG SS PI 7.5 STRL IVOR (GLOVE) ×1 IMPLANT
GLOVE SURG SS PI 8.0 STRL IVOR (GLOVE) IMPLANT
GOWN STRL REUS W/TWL XL LVL3 (GOWN DISPOSABLE) ×4 IMPLANT
IV CATH 14GX2 1/4 (CATHETERS) IMPLANT
KIT BASIN OR (CUSTOM PROCEDURE TRAY) ×2 IMPLANT
KIT POSITIONING SURG ANDREWS (MISCELLANEOUS) ×2 IMPLANT
MANIFOLD NEPTUNE II (INSTRUMENTS) ×2 IMPLANT
NDL SPNL 18GX3.5 QUINCKE PK (NEEDLE) ×2 IMPLANT
NEEDLE SPNL 18GX3.5 QUINCKE PK (NEEDLE) ×4 IMPLANT
PATTIES SURGICAL .5 X.5 (GAUZE/BANDAGES/DRESSINGS) IMPLANT
PATTIES SURGICAL .75X.75 (GAUZE/BANDAGES/DRESSINGS) IMPLANT
PATTIES SURGICAL 1X1 (DISPOSABLE) IMPLANT
SPONGE LAP 4X18 X RAY DECT (DISPOSABLE) ×1 IMPLANT
SPONGE SURGIFOAM ABS GEL 100 (HEMOSTASIS) ×2 IMPLANT
STRIP CLOSURE SKIN 1/2X4 (GAUZE/BANDAGES/DRESSINGS) ×2 IMPLANT
SUT NURALON 4 0 TR CR/8 (SUTURE) IMPLANT
SUT PROLENE 3 0 PS 2 (SUTURE) ×4 IMPLANT
SUT VIC AB 1 CT1 27 (SUTURE) ×2
SUT VIC AB 1 CT1 27XBRD ANTBC (SUTURE) IMPLANT
SUT VIC AB 1-0 CT2 27 (SUTURE) ×1 IMPLANT
SUT VIC AB 2-0 CT1 27 (SUTURE)
SUT VIC AB 2-0 CT1 TAPERPNT 27 (SUTURE) IMPLANT
SUT VIC AB 2-0 CT2 27 (SUTURE) ×1 IMPLANT
SYR 3ML LL SCALE MARK (SYRINGE) IMPLANT
TOWEL OR 17X26 10 PK STRL BLUE (TOWEL DISPOSABLE) ×2 IMPLANT
TOWEL OR NON WOVEN STRL DISP B (DISPOSABLE) ×2 IMPLANT
TRAY FOLEY BAG SILVER LF 16FR (CATHETERS) ×2 IMPLANT
TRAY LAMINECTOMY (CUSTOM PROCEDURE TRAY) ×2 IMPLANT
YANKAUER SUCT BULB TIP NO VENT (SUCTIONS) IMPLANT

## 2014-05-14 NOTE — H&P (View-Only) (Signed)
[name] is an 48 y.o. female.   Chief Complaint: back and LLE pain HPI: sudden onset back and L leg pain without injury. Initially seen in ER, xrays and MRI done, referred to Dr. Beane for further eval. Denies injury or change in activity. C/o severe pain, weakness L leg refractory to pain medications.  Past Medical History  Diagnosis Date  . Blood transfusion 1995  . Kidney stone   . Constipation     SOME BLOOD IN STOOL  . Anxiety     NEW-DUE TO ANXIETY OVER DX OF CANCER  . GERD (gastroesophageal reflux disease)     occasionally-NO MEDS  . Cancer     RECTAL CANCER  . PONV (postoperative nausea and vomiting)     used a scop patch last surgery-was better    Past Surgical History  Procedure Laterality Date  . Abdominal hysterectomy  1995    partial  . Left ankle surgery for fx  several yrs ago  . Endoscopic left fallopian tube removed  several yrs ago  . Excision/release bursa hip  09/15/2011    Dr Beane EXCISION/RELEASE BURSA HIP;  Surgeon: Jeffrey C Beane, MD;  Location: [facility];  Service: Orthopedics;  Laterality: Left;  Excision of Trochanteric Bursitis  . Colonoscopy  06/01/2012    Procedure: COLONOSCOPY;  Surgeon: Sandi L Fields, MD;  Location: [facility];  Service: Endoscopy;  Laterality: N/A;  1:30PM  . Eus  06/06/2012    Procedure: LOWER ENDOSCOPIC ULTRASOUND (EUS);  Surgeon: William Outlaw, MD;  Location: [facility];  Service: Endoscopy;  Laterality: N/A;  . Laparoscopic low anterior resection  07/02/2012    Procedure: LAPAROSCOPIC LOW ANTERIOR RESECTION;  Surgeon: Faera Byerly, MD;  Location: [facility];  Service: General;  Laterality: N/A;  . Colon surgery    . Examination under anesthesia  09/07/2012    Procedure: EXAM UNDER ANESTHESIA;  Surgeon: Faera Byerly, MD;  Location: [facility];  Service: General;  Laterality: N/A;  . Proctoscopy  09/07/2012    Procedure: PROCTOSCOPY;  Surgeon: Faera Byerly, MD;  Location: [facility];  Service:  General;  Laterality: N/A;  . Flexible sigmoidoscopy N/A 03/22/2013    Procedure: FLEXIBLE SIGMOIDOSCOPY;  Surgeon: Sandi L Fields, MD;  Location: [facility];  Service: Endoscopy;  Laterality: N/A;  2:00  . Colonoscopy N/A 06/14/2013    Procedure: COLONOSCOPY;  Surgeon: Sandi L Fields, MD;  Location: [facility];  Service: Endoscopy;  Laterality: N/A;  1:30 PM-moved to 12:45 Melanie notified pt    Family History  Problem Relation Age of Onset  . Other Sister   . Colon cancer Paternal Aunt    Social History:  reports that she has never smoked. She has never used smokeless tobacco. She reports that she drinks alcohol. She reports that she does not use illicit drugs.  Allergies:  Allergies  Allergen Reactions  . Codeine Nausea Only    Confirmed intolerance of codeine verbally with pt in PACU, pt states does not cause any SOB or dyspnea, some nausea only (MN-RN)  . Ultram [Tramadol] Nausea And Vomiting    Ultram allergy also discussed, active N&V occurs with Ultram  . Latex Itching and Rash     (Not in a hospital admission)  Results for orders placed during the hospital encounter of 05/12/14 (from the past 48 hour(s))  SURGICAL PCR SCREEN     Status: None   Collection Time    05/12/14  2:21 PM        Result Value Ref Range   MRSA, PCR NEGATIVE  NEGATIVE   Staphylococcus aureus NEGATIVE  NEGATIVE   Comment:            The Xpert SA Assay (FDA     approved for NASAL specimens     in patients over 21 years of age),     is one component of     a comprehensive surveillance     program.  Test performance has     been validated by [facility] for patients greater     than or equal to 1 year old.     It is not intended     to diagnose infection nor to     guide or monitor treatment.  BASIC METABOLIC PANEL     Status: None   Collection Time    05/12/14  2:25 PM      Result Value Ref Range   Sodium 139  137 - 147 mEq/L   Potassium 4.5  3.7 - 5.3 mEq/L   Chloride 98  96 -  112 mEq/L   CO2 29  19 - 32 mEq/L   Glucose, Bld 95  70 - 99 mg/dL   BUN 7  6 - 23 mg/dL   Creatinine, Ser 0.73  0.50 - 1.10 mg/dL   Calcium 9.8  8.4 - 10.5 mg/dL   GFR calc non Af Amer >90  >90 mL/min   GFR calc Af Amer >90  >90 mL/min   Comment: (NOTE)     The eGFR has been calculated using the CKD EPI equation.     This calculation has not been validated in all clinical situations.     eGFR's persistently <90 mL/min signify possible Chronic Kidney     Disease.   Anion gap 12  5 - 15  CBC     Status: None   Collection Time    05/12/14  2:25 PM      Result Value Ref Range   WBC 7.0  4.0 - 10.5 K/uL   RBC 4.89  3.87 - 5.11 MIL/uL   Hemoglobin 14.8  12.0 - 15.0 g/dL   HCT 45.3  36.0 - 46.0 %   MCV 92.6  78.0 - 100.0 fL   MCH 30.3  26.0 - 34.0 pg   MCHC 32.7  30.0 - 36.0 g/dL   RDW 12.9  11.5 - 15.5 %   Platelets 243  150 - 400 K/uL  URINALYSIS, ROUTINE W REFLEX MICROSCOPIC     Status: None   Collection Time    05/12/14  3:17 PM      Result Value Ref Range   Color, Urine YELLOW  YELLOW   APPearance CLEAR  CLEAR   Specific Gravity, Urine 1.007  1.005 - 1.030   pH 7.5  5.0 - 8.0   Glucose, UA NEGATIVE  NEGATIVE mg/dL   Hgb urine dipstick NEGATIVE  NEGATIVE   Bilirubin Urine NEGATIVE  NEGATIVE   Ketones, ur NEGATIVE  NEGATIVE mg/dL   Protein, ur NEGATIVE  NEGATIVE mg/dL   Urobilinogen, UA 0.2  0.0 - 1.0 mg/dL   Nitrite NEGATIVE  NEGATIVE   Leukocytes, UA NEGATIVE  NEGATIVE   Comment: MICROSCOPIC NOT DONE ON URINES WITH NEGATIVE PROTEIN, BLOOD, LEUKOCYTES, NITRITE, OR GLUCOSE <1000 mg/dL.   Dg Lumbar Spine 2-3 Views  05/12/2014   CLINICAL DATA:  Preoperative radiographs for lumbar spine surgery.  EXAM: LUMBAR SPINE - 2-3 VIEW  COMPARISON:  MRI lumbar   spine 05/02/2014.  FINDINGS: Five lumbar type vertebral bodies are present. There is a dextroconvex mild scoliosis with the apex at L 2. Vertebral body height preserved. Asymmetric disc space collapse present on the RIGHT at  L4-L5. Multilevel spondylosis is present which is moderate. Notably, the loss of disc height at L4-L5 appears more prominent in the standing upright position when compared to the prior MRI allowing for differences in modalities. Vertebral body height is preserved. No destructive osseous lesions.  IMPRESSION: Dextroconvex lumbar scoliosis.  Moderate degenerative disc disease.   Electronically Signed   By: Geoffrey  Lamke M.D.   On: 05/12/2014 15:14    Review of Systems  Constitutional: Negative.   HENT: Negative.   Eyes: Negative.   Respiratory: Negative.   Cardiovascular: Negative.   Gastrointestinal: Negative.   Genitourinary: Negative.   Musculoskeletal: Positive for back pain.  Skin: Negative.   Neurological: Positive for sensory change and focal weakness.  Psychiatric/Behavioral: Negative.     There were no vitals taken for this visit. Physical Exam  Constitutional: She is oriented to person, place, and time. She appears well-developed and well-nourished. She appears distressed.  HENT:  Head: Normocephalic and atraumatic.  Eyes: Conjunctivae and EOM are normal. Pupils are equal, round, and reactive to light.  Neck: Normal range of motion. Neck supple.  Cardiovascular: Normal rate and regular rhythm.   Respiratory: Effort normal and breath sounds normal.  GI: Soft.  Musculoskeletal:  On exam, in serious distress. Unable to walk, leaning to the right. The patient has decreased sensation in the L3, L4 nerve root distribution. Hip flexor and quad weakness. Positive femoral stretch. Global limited range of motion in the lumbar spine.  Neurological: She is alert and oriented to person, place, and time. She has normal reflexes.  Skin: Skin is warm and dry.  Psychiatric: She has a normal mood and affect.    X-rays, MRI demonstrates mild scoliosis, extruded fragment at 3-4 migrating in cephalad compressing the 3 root.  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 Neurotension signs, myotomal weakness and dermatomal dysesthesias, and severe pain. The patient has gone through the emergency room and has had an MRI.  We discussed lumbar decompression given her severity of pain and neurologic deficit.  I had an extensive discussion of the risks and benefits of the lumbar decompression with the patient including bleeding, infection, damage to neurovascular structures, epidural fibrosis, CSF leak requiring repair. We also discussed increase in pain, adjacent segment disease, recurrent disc herniation, need for future surgery including repeat decompression and/or fusion. We also discussed risks of postoperative hematoma, paralysis, anesthetic complications including DVT, PE, death, cardiopulmonary dysfunction. In addition, the perioperative and postoperative courses were discussed in detail including the rehabilitative time and return to functional activity and work. I provided the patient with an illustrated handout and utilized the appropriate surgical models.  Other options just prior to those are L3 selective nerve root block. We will try that as one option. Discussed different positions to unload and open up the neural foramen. She may be able to get her symptoms settled down a little bit. I gave her Percocet. Reviewed the instructions to her mother who was present here. Proceed to set her up in the operating room. Will try to get her pain settled down in the interim. I do feel we have to proceed to electively decompress this. My concern is that she will not get any relief from the injection or continued conservative   treatment.   Allegra Cerniglia M. for Dr. Beane 05/14/2014, 7:00 AM    

## 2014-05-14 NOTE — Evaluation (Addendum)
Physical Therapy Evaluation Patient Details Name: [name] MRN: [id number] DOB: 18-Oct-1965 Today's Date: 05/14/2014   History of Present Illness     Clinical Impression  Pt s/p L 3-4 lumbar decompression presents with functional mobility limitations 2* post op pain and back precautions.  Pt should progress well to d/c home with family assist.     Follow Up Recommendations No PT follow up    Equipment Recommendations  Rolling walker with 5" wheels    Recommendations for Other Services OT consult     Precautions / Restrictions Precautions Precautions: Back Precaution Booklet Issued: Yes (comment) Restrictions Weight Bearing Restrictions: No      Mobility  Bed Mobility               General bed mobility comments: NT - OOB with nursing.  Deomnstrated correct technique - pt states she was doing similar prior to surgery  Transfers Overall transfer level: Needs assistance Equipment used: Rolling walker (2 wheeled) Transfers: Sit to/from Stand Sit to Stand: Min assist         General transfer comment: cues for transition position, adherence to back precautions and use of UEs to self assist  Ambulation/Gait Ambulation/Gait assistance: Min assist;Min guard Ambulation Distance (Feet): 222 Feet Assistive device: Rolling walker (2 wheeled) Gait Pattern/deviations: Step-through pattern;Decreased step length - right;Decreased step length - left;Shuffle Gait velocity: decr   General Gait Details: cues for posture and position from RW - several short standing rests to complete task  Stairs            Wheelchair Mobility    Modified Rankin (Stroke Patients Only)       Balance                                             Pertinent Vitals/Pain Pain Assessment: 0-10 Pain Score: 5  Pain Location: back Pain Descriptors / Indicators: Aching;Sore Pain Intervention(s): Limited activity within patient's tolerance;Monitored during  session;Premedicated before session    Home Living Family/patient expects to be discharged to:: Private residence Living Arrangements: Children Available Help at Discharge: Family Type of Home: House Home Access: Stairs to enter Entrance Stairs-Rails: Right;Left;Can reach both Technical brewer of Steps: 8 Home Layout: Two level Home Equipment: Crutches Additional Comments: 2 steps down to kitchen    Prior Function Level of Independence: Independent with assistive device(s)         Comments: using crutches for support     Hand Dominance        Extremity/Trunk Assessment   Upper Extremity Assessment: Overall WFL for tasks assessed           Lower Extremity Assessment: Overall WFL for tasks assessed         Communication   Communication: No difficulties  Cognition Arousal/Alertness: Awake/alert Behavior During Therapy: WFL for tasks assessed/performed Overall Cognitive Status: Within Functional Limits for tasks assessed                      General Comments      Exercises        Assessment/Plan    PT Assessment Patient needs continued PT services  PT Diagnosis Difficulty walking   PT Problem List Decreased activity tolerance;Decreased mobility;Decreased knowledge of use of DME;Pain;Decreased knowledge of precautions  PT Treatment Interventions DME instruction;Gait training;Stair training;Functional mobility training;Therapeutic activities;Therapeutic exercise;Patient/family education   PT Goals (  Current goals can be found in the Care Plan section) Acute Rehab PT Goals Patient Stated Goal: Resume previous lifestyle with decreased pain PT Goal Formulation: With patient Time For Goal Achievement: 05/21/14 Potential to Achieve Goals: Good    Frequency 7X/week   Barriers to discharge        Co-evaluation               End of Session   Activity Tolerance: Patient tolerated treatment well Patient left: in chair;with call  bell/phone within reach Nurse Communication: Mobility status    Functional Assessment Tool Used: clinical judgement Functional Limitation: Mobility: Walking and moving around Mobility: Walking and Moving Around Current Status (M3559): At least 20 percent but less than 40 percent impaired, limited or restricted Mobility: Walking and Moving Around Goal Status [phone]): At least 1 percent but less than 20 percent impaired, limited or restricted    Time: 1500-1530 PT Time Calculation (min): 30 min   Charges:   PT Evaluation $Initial PT Evaluation Tier I: 1 Procedure PT Treatments $Gait Training: 8-22 mins $Therapeutic Activity: 8-22 mins   PT G Codes:   Functional Assessment Tool Used: clinical judgement Functional Limitation: Mobility: Walking and moving around    [facility] 05/14/2014, 3:45 PM

## 2014-05-14 NOTE — Progress Notes (Signed)
Patient ID: [name], female   DOB: 02-26-1966, 48 y.o.   MRN: [id number] Small pimples buttock with slight erythema. No purulence. Will start doxycycline.

## 2014-05-14 NOTE — Brief Op Note (Signed)
05/14/2014  9:03 AM  PATIENT:  [name]  48 y.o. female  PRE-OPERATIVE DIAGNOSIS:  HNP L3-L4  POST-OPERATIVE DIAGNOSIS:  HNP L3-L4  PROCEDURE:  Procedure(s): LUMBAR DECOMPRESSION L2-L3 POSS L3-L4  ( 2 LEVELS)  (N/A)  SURGEON:  Surgeon(s) and Role:    * Johnn Hai, MD - Primary  PHYSICIAN ASSISTANT:   ASSISTANTS: Bissell   ANESTHESIA:   general  EBL:  Total I/O In: 1000 [I.V.:1000] Out: 450 [Urine:450]  BLOOD ADMINISTERED:none  DRAINS: none   LOCAL MEDICATIONS USED:  MARCAINE     SPECIMEN:  Source of Specimen:  L34  DISPOSITION OF SPECIMEN:  PATHOLOGY  COUNTS:  YES  TOURNIQUET:  * No tourniquets in log *  DICTATION: .Other Dictation: Dictation Number [id number]  PLAN OF CARE: Admit for overnight observation  PATIENT DISPOSITION:  PACU - hemodynamically stable.   Delay start of Pharmacological VTE agent (>24hrs) due to surgical blood loss or risk of bleeding: yes

## 2014-05-14 NOTE — Discharge Instructions (Signed)
Walk As Tolerated utilizing back precautions.  No bending, twisting, or lifting.  No driving for 2 weeks.   May shower with aquacel dressing in place. If the dressing becomes saturated or peels up at the edges remove aquacel dressing and place gauze and tape dressing which should be kept clean and dry and changed daily. Do not remove steri-strips if they are present. See Dr. Tonita Cong in office in 10 to 14 days. Begin taking aspirin 81mg  per day starting 4 days after your surgery if not allergic to aspirin or on another blood thinner. Walk daily even outside. Use a cane or walker only if necessary. Avoid sitting on soft sofas.

## 2014-05-14 NOTE — Anesthesia Preprocedure Evaluation (Signed)
Anesthesia Evaluation  Patient identified by MRN, date of birth, ID band Patient awake    Reviewed: Allergy & Precautions, H&P , NPO status , Patient's Chart, lab work & pertinent test results  History of Anesthesia Complications (+) PONV and history of anesthetic complications  Airway Mallampati: II TM Distance: >3 FB Neck ROM: Full    Dental no notable dental hx.    Pulmonary neg pulmonary ROS,  breath sounds clear to auscultation  Pulmonary exam normal       Cardiovascular Exercise Tolerance: Good negative cardio ROS  Rhythm:Regular Rate:Normal     Neuro/Psych Anxiety Depression negative neurological ROS     GI/Hepatic Neg liver ROS, GERD-  Medicated and Controlled,  Endo/Other  negative endocrine ROS  Renal/GU negative Renal ROS  negative genitourinary   Musculoskeletal negative musculoskeletal ROS (+)   Abdominal   Peds negative pediatric ROS (+)  Hematology negative hematology ROS (+)   Anesthesia Other Findings   Reproductive/Obstetrics negative OB ROS                           Anesthesia Physical Anesthesia Plan  ASA: II  Anesthesia Plan: General   Post-op Pain Management:    Induction: Intravenous  Airway Management Planned: Oral ETT  Additional Equipment:   Intra-op Plan:   Post-operative Plan: Extubation in OR  Informed Consent: I have reviewed the patients History and Physical, chart, labs and discussed the procedure including the risks, benefits and alternatives for the proposed anesthesia with the patient or authorized representative who has indicated his/her understanding and acceptance.   Dental advisory given  Plan Discussed with: CRNA  Anesthesia Plan Comments:         Anesthesia Quick Evaluation

## 2014-05-14 NOTE — Evaluation (Signed)
Occupational Therapy Evaluation Patient Details Name: [name] MRN: [id number] DOB: July 25, 1966 Today's Date: 05/14/2014    History of Present Illness This 48 year old female was admitted for L2-3 decompression and retrieval of multiple fragments from herniated disc at L3-4   Clinical Impression   Pt was admitted for the above back surgery.  Prior to admission, she was mod I for adls but had pain when performing these.  Educated on back precautions and ADLs/toilet transfers.  Pt was anxious about doing something wrong, but did get more comfortable as session progressed. She will benefit from one more OT session in acute to review shower transfer and further educate on toilet aide.     Follow Up Recommendations  No OT follow up;Supervision/Assistance - 24 hour    Equipment Recommendations   (3:1 commode, if she cannot borrow from grandmother)    Recommendations for Other Services       Precautions / Restrictions Precautions Precautions: Back Precaution Booklet Issued: Yes (comment) Restrictions Weight Bearing Restrictions: No      Mobility Bed Mobility               General bed mobility comments: min A for rolling, supine to sit and sit to supine.  Assist for legs and cues for technique  Transfers Overall transfer level: Needs assistance Equipment used: Rolling walker (2 wheeled) Transfers: Sit to/from Stand Sit to Stand: Min assist         General transfer comment: cues for back precautions.  Practiced several times    Balance                                            ADL Overall ADL's : Needs assistance/impaired     Grooming: Wash/dry hands;Supervision/safety;Standing                   Toilet Transfer: Min guard;Ambulation;BSC   Toileting- Clothing Manipulation and Hygiene: Moderate assistance;Sit to/from stand         General ADL Comments: reviewed ADLs and back precautions.   Demonstrated safe ways to perform LB  ADLs.  Pt feels she will ask for help initially then perform from supine, bringing leg up.  She is not interested in AE other than toilet aide--she would like to see this. Pt needs max A for LB adls.   pt is able to perform UB adls with set up.  She is anxious about doing something wrong.  She was up in chair when I arrived and I assisted her back to bed as she was uncomfortable.  She then wanted to try to use bathroom.  Ambulated to bathroom then repositioned in bed.  Pt's pain remained a "6".  Requested pain medication     Vision                     Perception     Praxis      Pertinent Vitals/Pain Pain Assessment: 0-10 Pain Score: 6  Pain Location: back Pain Descriptors / Indicators: Aching Pain Intervention(s): Limited activity within patient's tolerance;Monitored during session;Repositioned;Patient requesting pain meds-RN notified     Hand Dominance     Extremity/Trunk Assessment Upper Extremity Assessment Upper Extremity Assessment: Overall WFL for tasks assessed          Communication Communication Communication: No difficulties   Cognition Arousal/Alertness: Awake/alert Behavior During Therapy: Anxious Overall Cognitive  Status: Within Functional Limits for tasks assessed                     General Comments       Exercises       Shoulder Instructions      Home Living Family/patient expects to be discharged to:: Private residence Living Arrangements: Children Available Help at Discharge: Family Type of Home: House Home Access: Stairs to enter Technical brewer of Steps: 8 Entrance Stairs-Rails: Right;Left;Can reach both Home Layout: Two level Alternate Level Stairs-Number of Steps: 2 Alternate Level Stairs-Rails: None Bathroom Shower/Tub: Occupational psychologist: Standard     Home Equipment: Crutches   Additional Comments: may be able to borrow 3;1 from grandmother      Prior Functioning/Environment Level of  Independence: Independent with assistive device(s)        Comments: using crutches for support    OT Diagnosis: Generalized weakness   OT Problem List: Decreased strength;Decreased activity tolerance;Decreased knowledge of use of DME or AE;Decreased knowledge of precautions;Pain   OT Treatment/Interventions: Self-care/ADL training;DME and/or AE instruction;Patient/family education    OT Goals(Current goals can be found in the care plan section) Acute Rehab OT Goals Patient Stated Goal: Resume previous lifestyle with decreased pain OT Goal Formulation: With patient Time For Goal Achievement: 05/21/14 Potential to Achieve Goals: Good ADL Goals Pt Will Transfer to Toilet: with supervision;ambulating;bedside commode Pt Will Perform Toileting - Clothing Manipulation and hygiene: with supervision;with adaptive equipment;sit to/from stand Pt Will Perform Tub/Shower Transfer: with supervision;Shower transfer;3 in 1;ambulating Additional ADL Goal #1: Pt will not need cues for back precautions during adls/bathroom transfers  OT Frequency: Min 2X/week   Barriers to D/C:            Co-evaluation              End of Session    Activity Tolerance: Patient tolerated treatment well Patient left: in bed;with call bell/phone within reach   Time: 1543-1626 OT Time Calculation (min): 43 min Charges:  OT General Charges $OT Visit: 1 Procedure OT Evaluation $Initial OT Evaluation Tier I: 1 Procedure OT Treatments $Therapeutic Activity: 23-37 mins G-Codes: OT G-codes **NOT FOR INPATIENT CLASS** Functional Assessment Tool Used: clinical observation/judgment Functional Limitation: Self care Self Care Current Status (C5852): At least 60 percent but less than 80 percent impaired, limited or restricted Self Care Goal Status (D7824): At least 1 percent but less than 20 percent impaired, limited or restricted  [facility] 05/14/2014, 4:42 PM Lesle Chris,  OTR/L [phone] 05/14/2014

## 2014-05-14 NOTE — H&P (Signed)
[name] is an 48 y.o. female.   Chief Complaint: back and LLE pain HPI: sudden onset back and L leg pain without injury. Initially seen in ER, xrays and MRI done, referred to Dr. Tonita Cong for further eval. Denies injury or change in activity. C/o severe pain, weakness L leg refractory to pain medications.  Past Medical History  Diagnosis Date  . Blood transfusion 1995  . Kidney stone   . Constipation     SOME BLOOD IN STOOL  . Anxiety     NEW-DUE TO ANXIETY OVER DX OF CANCER  . GERD (gastroesophageal reflux disease)     occasionally-NO MEDS  . Cancer     RECTAL CANCER  . PONV (postoperative nausea and vomiting)     used a scop patch last surgery-was better    Past Surgical History  Procedure Laterality Date  . Abdominal hysterectomy  1995    partial  . Left ankle surgery for fx  several yrs ago  . Endoscopic left fallopian tube removed  several yrs ago  . Excision/release bursa hip  09/15/2011    Dr Tonita Cong EXCISION/RELEASE BURSA HIP;  Surgeon: Johnn Hai, MD;  Location: [facility];  Service: Orthopedics;  Laterality: Left;  Excision of Trochanteric Bursitis  . Colonoscopy  06/01/2012    Procedure: COLONOSCOPY;  Surgeon: Danie Binder, MD;  Location: [facility];  Service: Endoscopy;  Laterality: N/A;  1:30PM  . Eus  06/06/2012    Procedure: LOWER ENDOSCOPIC ULTRASOUND (EUS);  Surgeon: Arta Silence, MD;  Location: [facility];  Service: Endoscopy;  Laterality: N/A;  . Laparoscopic low anterior resection  07/02/2012    Procedure: LAPAROSCOPIC LOW ANTERIOR RESECTION;  Surgeon: Stark Klein, MD;  Location: [facility];  Service: General;  Laterality: N/A;  . Colon surgery    . Examination under anesthesia  09/07/2012    Procedure: EXAM UNDER ANESTHESIA;  Surgeon: Stark Klein, MD;  Location: [place];  Service: General;  Laterality: N/A;  . Proctoscopy  09/07/2012    Procedure: PROCTOSCOPY;  Surgeon: Stark Klein, MD;  Location: [place];  Service:  General;  Laterality: N/A;  . Flexible sigmoidoscopy N/A 03/22/2013    Procedure: FLEXIBLE SIGMOIDOSCOPY;  Surgeon: Danie Binder, MD;  Location: [facility];  Service: Endoscopy;  Laterality: N/A;  2:00  . Colonoscopy N/A 06/14/2013    Procedure: COLONOSCOPY;  Surgeon: Danie Binder, MD;  Location: [facility];  Service: Endoscopy;  Laterality: N/A;  1:30 PM-moved to 12:45 Melanie notified pt    Family History  Problem Relation Age of Onset  . Other Sister   . Colon cancer Paternal Aunt    Social History:  reports that she has never smoked. She has never used smokeless tobacco. She reports that she drinks alcohol. She reports that she does not use illicit drugs.  Allergies:  Allergies  Allergen Reactions  . Codeine Nausea Only    Confirmed intolerance of codeine verbally with pt in PACU, pt states does not cause any SOB or dyspnea, some nausea only (MN-RN)  . Ultram [Tramadol] Nausea And Vomiting    Ultram allergy also discussed, active N&V occurs with Ultram  . Latex Itching and Rash     (Not in a hospital admission)  Results for orders placed during the hospital encounter of 05/12/14 (from the past 48 hour(s))  SURGICAL PCR SCREEN     Status: None   Collection Time    05/12/14  2:21 PM  Result Value Ref Range   MRSA, PCR NEGATIVE  NEGATIVE   Staphylococcus aureus NEGATIVE  NEGATIVE   Comment:            The Xpert SA Assay (FDA     approved for NASAL specimens     in patients over 54 years of age),     is one component of     a comprehensive surveillance     program.  Test performance has     been validated by [employer] for patients greater     than or equal to 45 year old.     It is not intended     to diagnose infection nor to     guide or monitor treatment.  BASIC METABOLIC PANEL     Status: None   Collection Time    05/12/14  2:25 PM      Result Value Ref Range   Sodium 139  137 - 147 mEq/L   Potassium 4.5  3.7 - 5.3 mEq/L   Chloride 98  96 -  112 mEq/L   CO2 29  19 - 32 mEq/L   Glucose, Bld 95  70 - 99 mg/dL   BUN 7  6 - 23 mg/dL   Creatinine, Ser 0.73  0.50 - 1.10 mg/dL   Calcium 9.8  8.4 - 10.5 mg/dL   GFR calc non Af Amer >90  >90 mL/min   GFR calc Af Amer >90  >90 mL/min   Comment: (NOTE)     The eGFR has been calculated using the CKD EPI equation.     This calculation has not been validated in all clinical situations.     eGFR's persistently <90 mL/min signify possible Chronic Kidney     Disease.   Anion gap 12  5 - 15  CBC     Status: None   Collection Time    05/12/14  2:25 PM      Result Value Ref Range   WBC 7.0  4.0 - 10.5 K/uL   RBC 4.89  3.87 - 5.11 MIL/uL   Hemoglobin 14.8  12.0 - 15.0 g/dL   HCT 45.3  36.0 - 46.0 %   MCV 92.6  78.0 - 100.0 fL   MCH 30.3  26.0 - 34.0 pg   MCHC 32.7  30.0 - 36.0 g/dL   RDW 12.9  11.5 - 15.5 %   Platelets 243  150 - 400 K/uL  URINALYSIS, ROUTINE W REFLEX MICROSCOPIC     Status: None   Collection Time    05/12/14  3:17 PM      Result Value Ref Range   Color, Urine YELLOW  YELLOW   APPearance CLEAR  CLEAR   Specific Gravity, Urine 1.007  1.005 - 1.030   pH 7.5  5.0 - 8.0   Glucose, UA NEGATIVE  NEGATIVE mg/dL   Hgb urine dipstick NEGATIVE  NEGATIVE   Bilirubin Urine NEGATIVE  NEGATIVE   Ketones, ur NEGATIVE  NEGATIVE mg/dL   Protein, ur NEGATIVE  NEGATIVE mg/dL   Urobilinogen, UA 0.2  0.0 - 1.0 mg/dL   Nitrite NEGATIVE  NEGATIVE   Leukocytes, UA NEGATIVE  NEGATIVE   Comment: MICROSCOPIC NOT DONE ON URINES WITH NEGATIVE PROTEIN, BLOOD, LEUKOCYTES, NITRITE, OR GLUCOSE <1000 mg/dL.   Dg Lumbar Spine 2-3 Views  05/12/2014   CLINICAL DATA:  Preoperative radiographs for lumbar spine surgery.  EXAM: LUMBAR SPINE - 2-3 VIEW  COMPARISON:  MRI lumbar  spine 05/02/2014.  FINDINGS: Five lumbar type vertebral bodies are present. There is a dextroconvex mild scoliosis with the apex at L 2. Vertebral body height preserved. Asymmetric disc space collapse present on the RIGHT at  L4-L5. Multilevel spondylosis is present which is moderate. Notably, the loss of disc height at L4-L5 appears more prominent in the standing upright position when compared to the prior MRI allowing for differences in modalities. Vertebral body height is preserved. No destructive osseous lesions.  IMPRESSION: Dextroconvex lumbar scoliosis.  Moderate degenerative disc disease.   Electronically Signed   By: Dereck Ligas M.D.   On: 05/12/2014 15:14    Review of Systems  Constitutional: Negative.   HENT: Negative.   Eyes: Negative.   Respiratory: Negative.   Cardiovascular: Negative.   Gastrointestinal: Negative.   Genitourinary: Negative.   Musculoskeletal: Positive for back pain.  Skin: Negative.   Neurological: Positive for sensory change and focal weakness.  Psychiatric/Behavioral: Negative.     There were no vitals taken for this visit. Physical Exam  Constitutional: She is oriented to person, place, and time. She appears well-developed and well-nourished. She appears distressed.  HENT:  Head: Normocephalic and atraumatic.  Eyes: Conjunctivae and EOM are normal. Pupils are equal, round, and reactive to light.  Neck: Normal range of motion. Neck supple.  Cardiovascular: Normal rate and regular rhythm.   Respiratory: Effort normal and breath sounds normal.  GI: Soft.  Musculoskeletal:  On exam, in serious distress. Unable to walk, leaning to the right. The patient has decreased sensation in the L3, L4 nerve root distribution. Hip flexor and quad weakness. Positive femoral stretch. Global limited range of motion in the lumbar spine.  Neurological: She is alert and oriented to person, place, and time. She has normal reflexes.  Skin: Skin is warm and dry.  Psychiatric: She has a normal mood and affect.    X-rays, MRI demonstrates mild scoliosis, extruded fragment at 3-4 migrating in cephalad compressing the 3 root.  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 Neurotension signs, myotomal weakness and dermatomal dysesthesias, and severe pain. The patient has gone through the emergency room and has had an MRI.  We discussed lumbar decompression given her severity of pain and neurologic deficit.  I had an extensive discussion of the risks and benefits of the lumbar decompression with the patient including bleeding, infection, damage to neurovascular structures, epidural fibrosis, CSF leak requiring repair. We also discussed increase in pain, adjacent segment disease, recurrent disc herniation, need for future surgery including repeat decompression and/or fusion. We also discussed risks of postoperative hematoma, paralysis, anesthetic complications including DVT, PE, death, cardiopulmonary dysfunction. In addition, the perioperative and postoperative courses were discussed in detail including the rehabilitative time and return to functional activity and work. I provided the patient with an illustrated handout and utilized the appropriate surgical models.  Other options just prior to those are L3 selective nerve root block. We will try that as one option. Discussed different positions to unload and open up the neural foramen. She may be able to get her symptoms settled down a little bit. I gave her Percocet. Reviewed the instructions to her mother who was present here. Proceed to set her up in the operating room. Will try to get her pain settled down in the interim. I do feel we have to proceed to electively decompress this. My concern is that she will not get any relief from the injection or continued conservative  treatment.   Lacie Draft M. for Dr. Tonita Cong 05/14/2014, 7:00 AM

## 2014-05-14 NOTE — Op Note (Signed)
[name], PUEBLA NO.:  [id number]  MEDICAL RECORD NO.:  [id number]  LOCATION:  [facility]                         FACILITY:  [facility]  PHYSICIAN:  Susa Day, M.D.    DATE OF BIRTH:  1966/02/20  DATE OF PROCEDURE: DATE OF DISCHARGE:                              OPERATIVE REPORT   PREOPERATIVE DIAGNOSIS:  Extruded herniated nucleus pulposus, L3-4.  POSTOPERATIVE DIAGNOSIS:  Extruded herniated nucleus pulposus, L3-4.  PROCEDURE PERFORMED: 1. Central lumbar decompression at L2-3 with foraminotomies of L3     bilaterally, L2 on the left. 2. Retrieval of multiple fragments of herniated disk from L3-4.  ANESTHESIA:  General.  ASSISTANT:  Cleophas Dunker, PA  BRIEF HISTORY:  This is a 48 year old with severe left leg pain, numbness, weakness secondary extruded fragment, migrating from the L3-4 disk space cephalad to behind the vertebral pedicle of 3.  Due to the severe pain and neurologic deficit, failing conservative treatment, she was indicated for decompression.  She had mild scoliosis anatomically at the L2-3 space.  The interlaminar space was posterior to the body of 3 and therefore our approach was through the interlaminar window of 2-3 instead of L3-4 interlaminar space.  Risk and benefits were discussed including bleeding, infection, damage to neurovascular structures, DVT, PE, anesthetic complications, etc.  TECHNIQUE:  The patient in supine position, after the induction of adequate general anesthesia and 2 g Kefzol, placed prone on the [place] frame.  Foley to gravity.  All bony prominences were well padded. Lumbar region was prepped and draped in usual sterile fashion.  Two 18- gauge spinal needle was utilized to localize 2-3 interspace confirmed with x-ray.  Incision was made above the spinous processes of 2 to below 3.  Subcutaneous tissue was dissected.  Electrocautery was utilized to achieve hemostasis.  Marcaine with epinephrine was  infiltrated in paraspinous tissue.  Dorsolumbar fascia identified and divided in line with the skin incision.  Paraspinous muscle was elevated from lamina of 2-3.  McCullough retractor was placed.  Confirmatory radiograph obtained.  Small interlaminar window 2-3 was noted.  The L3 spinous process was directly over the position of the disk space of the disk herniation.  This required a partial removal of spinous process of 3, performed a central decompression.  The operating microscope was brought in the surgical field.  We used a curette to detach the ligamentum flavum from the cephalad edge of 3.  I then used a 2 mm Kerrison and performed a central decompression removing the partial central lamina of 3 and performing foraminotomies bilaterally with neuro patties protecting the neural elements.  Given the small interlaminar window, we chose the central approach to preserve the pars.  Decompressed both lateral recesses to the medial border of the pedicle and particularly on the left, I am not getting the facet, performed a foraminotomy of 3 and 2, again preserving the pars.  Bone wax was used on the cancellous surfaces.  Bipolar electrocautery was utilized to achieve hemostasis.  A 3 root was identified and found to be severely compressed and gently mobilized the 3 root medially and a medially large extruded fragment was noted, this was removed with a micropituitary.  Two  additional fragments were also removed just medial to the pedicle out into the foramen and cephalad to that.  These were all removed.  We meticulously searched and there were no additional free fragments noted.  This decompressed the 3 root.  Then a neuro probe passed freely up the foramen of 3 and 2.  We checked caudad to the nerve root, cephalad to it.  No residual disk herniation as well as beneath the thecal sac.  There was no disk herniation noted at the disk space further caudad or cephalad.  Again, we used bone wax  for cancellous surfaces, copiously irrigated the wound and obtained a confirmatory radiograph with a neuro probe out the foramen of 3.  Excellent decompression was noted.  We felt pathology was resolved and we sent disk material to Pathology for evaluation, appeared normal.  Next, we copiously irrigated the wound.  Inspection revealed no CSF leakage or active bleeding.  I removed McCullough retractors, irrigated the paraspinous musculatures, placed thrombin-soaked Gelfoam in the laminotomy defect, then closed the dorsolumbar fascia with 1 Vicryl, subcu with 2-0, and skin with subcuticular suture.  Wound dressed sterilely, placed supine on hospital bed, extubated without difficulty, and transported to the recovery room in satisfactory condition.  The patient tolerated the procedure well.  No complications.  Assistant, Dr. Cleophas Dunker, PA was used for patient positioning, the gentle intermittent neural traction and closure, and assistance of the operating microscope.  Minimal blood loss.     Susa Day, M.D.     Geralynn Rile  D:  05/14/2014  T:  05/14/2014  Job:  [id number]

## 2014-05-14 NOTE — Anesthesia Postprocedure Evaluation (Signed)
  Anesthesia Post-op Note  Patient: [name]  Procedure(s) Performed: Procedure(s) (LRB): LUMBAR DECOMPRESSION L2-L3 (N/A)  Patient Location: PACU  Anesthesia Type: General  Level of Consciousness: awake and alert   Airway and Oxygen Therapy: Patient Spontanous Breathing  Post-op Pain: mild  Post-op Assessment: Post-op Vital signs reviewed, Patient's Cardiovascular Status Stable, Respiratory Function Stable, Patent Airway and No signs of Nausea or vomiting  Last Vitals:  Filed Vitals:   05/14/14 0930  BP: 120/73  Pulse: 100  Temp:   Resp: 14    Post-op Vital Signs: stable   Complications: No apparent anesthesia complications

## 2014-05-14 NOTE — Transfer of Care (Signed)
Immediate Anesthesia Transfer of Care Note  Patient: [name]  Procedure(s) Performed: Procedure(s): LUMBAR DECOMPRESSION L2-L3 (N/A)  Patient Location: PACU  Anesthesia Type:General  Level of Consciousness: awake, sedated and patient cooperative  Airway & Oxygen Therapy: Patient Spontanous Breathing and Patient connected to face mask oxygen  Post-op Assessment: Report given to PACU RN and Post -op Vital signs reviewed and stable  Post vital signs: Reviewed and stable  Complications: No apparent anesthesia complications

## 2014-05-14 NOTE — Interval H&P Note (Signed)
History and Physical Interval Note:  05/14/2014 7:33 AM  [name]  has presented today for surgery, with the diagnosis of HNP L3-L4  The various methods of treatment have been discussed with the patient and family. After consideration of risks, benefits and other options for treatment, the patient has consented to  Procedure(s): LUMBAR DECOMPRESSION L2-L3 POSS L3-L4  ( 2 LEVELS)  (N/A) as a surgical intervention .  The patient's history has been reviewed, patient examined, no change in status, stable for surgery.  I have reviewed the patient's chart and labs.  Questions were answered to the patient's satisfaction.     Demiyah Fischbach C

## 2014-05-15 ENCOUNTER — Encounter (HOSPITAL_COMMUNITY): Payer: Self-pay | Admitting: Specialist

## 2014-05-15 DIAGNOSIS — M419 Scoliosis, unspecified: Secondary | ICD-10-CM | POA: Diagnosis not present

## 2014-05-15 DIAGNOSIS — K219 Gastro-esophageal reflux disease without esophagitis: Secondary | ICD-10-CM | POA: Diagnosis not present

## 2014-05-15 DIAGNOSIS — M5126 Other intervertebral disc displacement, lumbar region: Secondary | ICD-10-CM | POA: Diagnosis not present

## 2014-05-15 DIAGNOSIS — F419 Anxiety disorder, unspecified: Secondary | ICD-10-CM | POA: Diagnosis not present

## 2014-05-15 LAB — BASIC METABOLIC PANEL
ANION GAP: 12 (ref 5–15)
BUN: 5 mg/dL — AB (ref 6–23)
CHLORIDE: 105 meq/L (ref 96–112)
CO2: 24 mEq/L (ref 19–32)
Calcium: 8.9 mg/dL (ref 8.4–10.5)
Creatinine, Ser: 0.6 mg/dL (ref 0.50–1.10)
Glucose, Bld: 138 mg/dL — ABNORMAL HIGH (ref 70–99)
Potassium: 4.3 mEq/L (ref 3.7–5.3)
Sodium: 141 mEq/L (ref 137–147)

## 2014-05-15 NOTE — Progress Notes (Signed)
Physical Therapy Treatment Patient Details Name: [name] MRN: [id number] DOB: July 22, 1966 Today's Date: May 19, 2014    History of Present Illness This 48 year old female was admitted for L2-3 decompression and retrieval of multiple fragments from herniated disc at L3-4    PT Comments    Progressing well with all mobility tasks.  Follow Up Recommendations  No PT follow up     Equipment Recommendations  Rolling walker with 5" wheels    Recommendations for Other Services OT consult     Precautions / Restrictions Precautions Precautions: Back Precaution Booklet Issued: Yes (comment) Precaution Comments: handout in room. reviewed all back precautions with pt. Restrictions Weight Bearing Restrictions: No    Mobility  Bed Mobility Overal bed mobility: Needs Assistance Bed Mobility: Rolling;Sidelying to Sit;Sit to Sidelying Rolling: Supervision Sidelying to sit: Supervision Supine to sit: Min guard Sit to supine: Min guard Sit to sidelying: Supervision General bed mobility comments: verbal cues for back precautions.  Transfers Overall transfer level: Needs assistance Equipment used: Rolling walker (2 wheeled) Transfers: Sit to/from Stand Sit to Stand: Supervision         General transfer comment: verbal cues for back precautions.  Ambulation/Gait Ambulation/Gait assistance: Min guard;Supervision Ambulation Distance (Feet): 400 Feet Assistive device: Rolling walker (2 wheeled) Gait Pattern/deviations: Step-through pattern;Decreased step length - right;Decreased step length - left     General Gait Details: min cues for posture and position from RW   Stairs Stairs: Yes Stairs assistance: Min guard Stair Management: One rail Right;Two rails;Step to pattern;Forwards Number of Stairs: 6 General stair comments: 3 stairs with bil rails and 3 stairs with single rail.  Cues fo rsequence  Wheelchair Mobility    Modified Rankin (Stroke Patients Only)        Balance                                    Cognition Arousal/Alertness: Awake/alert Behavior During Therapy: WFL for tasks assessed/performed Overall Cognitive Status: Within Functional Limits for tasks assessed                      Exercises      General Comments        Pertinent Vitals/Pain Pain Assessment: 0-10 Pain Score: 4  Pain Location: back  Pain Descriptors / Indicators: Aching Pain Intervention(s): Repositioned    Home Living                      Prior Function            PT Goals (current goals can now be found in the care plan section) Acute Rehab PT Goals Patient Stated Goal: Resume previous lifestyle with decreased pain PT Goal Formulation: With patient Time For Goal Achievement: 05/21/14 Potential to Achieve Goals: Good Progress towards PT goals: Progressing toward goals    Frequency  7X/week    PT Plan Current plan remains appropriate    Co-evaluation             End of Session   Activity Tolerance: Patient tolerated treatment well Patient left: in bed;with call bell/phone within reach     Time: 0849-0910 PT Time Calculation (min): 21 min  Charges:  $Gait Training: 8-22 mins                    G Codes:      [place] Start 2014/05/19, 11:19  AM

## 2014-05-15 NOTE — Progress Notes (Signed)
Occupational Therapy Treatment Patient Details Name: [name] MRN: [id number] DOB: 1966/03/25 Today's Date: 05/15/2014    History of present illness This 48 year old female was admitted for L2-3 decompression and retrieval of multiple fragments from herniated disc at L3-4   OT comments  Pt doing well. Practiced 3in1 and shower transfers. Educated on toilet aid and recommendation for use in order to adhere to back precautions. Pt verbalizes understanding of where to obtain toilet aid.   Follow Up Recommendations  No OT follow up;Supervision/Assistance - 24 hour    Equipment Recommendations  3 in 1 bedside comode    Recommendations for Other Services      Precautions / Restrictions Precautions Precautions: Back Precaution Booklet Issued: Yes (comment) Precaution Comments: handout in room. reviewed all back precautions with pt. Restrictions Weight Bearing Restrictions: No       Mobility Bed Mobility Overal bed mobility: Needs Assistance Bed Mobility: Rolling;Sidelying to Sit;Sit to Sidelying Rolling: Supervision Sidelying to sit: Supervision  Sit to sidelying: Supervision General bed mobility comments: verbal cues for back precautions.  Transfers Overall transfer level: Needs assistance Equipment used: Rolling walker (2 wheeled) Transfers: Sit to/from Stand Sit to Stand: Supervision         General transfer comment: verbal cues for back precautions.    Balance                                   ADL                           Toilet Transfer: Supervision/safety;Ambulation;RW;BSC   Toileting- Clothing Manipulation and Hygiene: Moderate assistance;Sit to/from stand without AE to adhere to precautions; simulated with toilet aid option with supervision.   Tub/ Shower Transfer: Walk-in shower;Min guard     General ADL Comments: Reviewed all back precautions with pt. Discussed and educated on toilet aid option and coverage. Demonstrated  toilet aid and recommend pt obtain toilet aid as she is unable to perform front or back periarea hygiene thoroughly without the toilet aid. She was able to wipe in the front in sitting but then states she needs to stand to finish cleaning more but then she tends to lean forward. Cautioned pt on the forward bending and the need for toilet aid to stay upright. Pt states she can obtain a toilet aid. Practiced shower transfer and she did well. Discussed placement of 3in1 in shower as a shower chair. She has a HH shower. Also discussed option of a LHS if desired so she can wash her own feet versus having assist. She plans to have assist with LB dressing.      Vision                     Perception     Praxis      Cognition   Behavior During Therapy: WFL for tasks assessed/performed Overall Cognitive Status: Within Functional Limits for tasks assessed                       Extremity/Trunk Assessment               Exercises     Shoulder Instructions       General Comments      Pertinent Vitals/ Pain       Pain Assessment: 0-10 Pain Score: 4  Pain Location: back  Pain Descriptors / Indicators: Aching Pain Intervention(s): Repositioned  Home Living                                          Prior Functioning/Environment              Frequency Min 2X/week     Progress Toward Goals  OT Goals(current goals can now be found in the care plan section)  Progress towards OT goals: Progressing toward goals  Acute Rehab OT Goals Patient Stated Goal: Resume previous lifestyle with decreased pain  Plan Discharge plan remains appropriate    Co-evaluation                 End of Session Equipment Utilized During Treatment: Rolling walker   Activity Tolerance Patient tolerated treatment well   Patient Left in bed;with call bell/phone within reach   Nurse Communication          Time: 7829-5621 OT Time Calculation (min): 29  min  Charges: OT General Charges $OT Visit: 1 Procedure OT Treatments $Self Care/Home Management : 8-22 mins $Therapeutic Activity: 8-22 mins  Jules Schick [phone] 05/15/2014, 12:16 PM

## 2014-05-15 NOTE — Progress Notes (Signed)
Subjective: 1 Day Post-Op Procedure(s) (LRB): LUMBAR DECOMPRESSION L2-L3 (N/A) Patient reports pain as mild.  Reports mild incisional back pain, well controlled. No leg pain, numbness, tingling. She has been OOB with PT several times now, tolerated well. Voiding without difficulty. Asking for flu vaccine. Seen by myself and Dr. Tonita Cong  Objective: Vital signs in last 24 hours: Temp:  [97.1 F (36.2 C)-98.9 F (37.2 C)] 98.2 F (36.8 C) (10/01 0900) Pulse Rate:  [62-111] 88 (10/01 0900) Resp:  [14-22] 22 (10/01 0900) BP: (116-151)/(58-82) 123/77 mmHg (10/01 0900) SpO2:  [97 %-100 %] 98 % (10/01 0900)  Intake/Output from previous day: 09/30 0701 - 10/01 0700 In: 3682.5 [P.O.:480; I.V.:3052.5; IV Piggyback:150] Out: 4100 [Urine:4100] Intake/Output this shift: Total I/O In: 120 [P.O.:120] Out: -    Recent Labs  05/12/14 1425  HGB 14.8    Recent Labs  05/12/14 1425  WBC 7.0  RBC 4.89  HCT 45.3  PLT 243    Recent Labs  05/12/14 1425 05/15/14 0440  NA 139 141  K 4.5 4.3  CL 98 105  CO2 29 24  BUN 7 5*  CREATININE 0.73 0.60  GLUCOSE 95 138*  CALCIUM 9.8 8.9   No results found for this basename: LABPT, INR,  in the last 72 hours  Neurologically intact ABD soft Neurovascular intact Sensation intact distally Intact pulses distally Dorsiflexion/Plantar flexion intact Incision: dressing C/D/I and no drainage No cellulitis present Compartment soft no calf pain or sign of DVT  Assessment/Plan: 1 Day Post-Op Procedure(s) (LRB): LUMBAR DECOMPRESSION L2-L3 (N/A) Advance diet Up with therapy D/C IV fluids No flu vaccine Discussed D/C instructions, dressing instructions, activity modifications, Lspine precautions  D/C home Follow up outpatient 10-14 days post-op for suture removal  [name] M. 05/15/2014, 10:08 AM

## 2014-05-15 NOTE — Care Management Note (Signed)
    Page 1 of 1   05/15/2014     12:00:22 PM CARE MANAGEMENT NOTE 05/15/2014  Patient:  CAMBELL, RICKENBACH   Account Number:  [id number]  Date Initiated:  05/15/2014  Documentation initiated by:  [facility]  Subjective/Objective Assessment:   adm: LUMBAR DECOMPRESSION L2-L3 (N/A)     Action/Plan:   discharge planning   Anticipated DC Date:  05/15/2014   Anticipated DC Plan:  HOME/SELF CARE         Choice offered to / List presented to:     DME arranged  3-N-1      DME agency  [facility]        Status of service:  Completed, signed off Medicare Important Message given?   (If response is "NO", the following Medicare IM given date fields will be blank) Date Medicare IM given:   Medicare IM given by:   Date Additional Medicare IM given:   Additional Medicare IM given by:    Discharge Disposition:  HOME/SELF CARE  Per UR Regulation:    If discussed at Long Length of Stay Meetings, dates discussed:    Comments:  05/16/14 11:50 CM notes no PT/OT follow up recc or ordered. CM notified [place] DME rep to deliver 3n1 to room prior to discharge today.  Pt states she has a rolling walker at home.  No other CM needs were communicated.  Mariane Masters, BSN, CM [phone].

## 2014-05-18 NOTE — Discharge Summary (Signed)
Physician Discharge Summary   Patient ID: [name] MRN: [id number] DOB/AGE: [birth year] 46 y.o.  Admit date: 05/14/2014 Discharge date: 05/18/2014  Primary Diagnosis:   HNP L3-L4  Admission Diagnoses:  Past Medical History  Diagnosis Date  . Blood transfusion 1995  . Kidney stone   . Constipation     SOME BLOOD IN STOOL  . Anxiety     NEW-DUE TO ANXIETY OVER DX OF CANCER  . GERD (gastroesophageal reflux disease)     occasionally-NO MEDS  . Cancer     RECTAL CANCER  . PONV (postoperative nausea and vomiting)     used a scop patch last surgery-was better   Discharge Diagnoses:   Principal Problem:   HNP (herniated nucleus pulposus), lumbar  Procedure:  Procedure(s) (LRB): LUMBAR DECOMPRESSION L2-L3 (N/A)   Consults: None  HPI:  see H&P    Laboratory Data: Hospital Outpatient Visit on 05/12/2014  Component Date Value Ref Range Status  . MRSA, PCR 05/12/2014 NEGATIVE  NEGATIVE Final  . Staphylococcus aureus 05/12/2014 NEGATIVE  NEGATIVE Final   Comment:                                 The Xpert SA Assay (FDA                          approved for NASAL specimens                          in patients over 61 years of age),                          is one component of                          a comprehensive surveillance                          program.  Test performance has                          been validated by [employer] for patients greater                          than or equal to 36 year old.                          It is not intended                          to diagnose infection nor to                          guide or monitor treatment.  . Sodium 05/12/2014 139  137 - 147 mEq/L Final  . Potassium 05/12/2014 4.5  3.7 - 5.3 mEq/L Final  . Chloride 05/12/2014 98  96 - 112 mEq/L Final  . CO2 05/12/2014 29  19 - 32 mEq/L Final  . Glucose, Bld 05/12/2014 95  70 -  99 mg/dL Final  . BUN 05/12/2014 7  6 - 23 mg/dL Final  .  Creatinine, Ser 05/12/2014 0.73  0.50 - 1.10 mg/dL Final  . Calcium 05/12/2014 9.8  8.4 - 10.5 mg/dL Final  . GFR calc non Af Amer 05/12/2014 >90  >90 mL/min Final  . GFR calc Af Amer 05/12/2014 >90  >90 mL/min Final   Comment: (NOTE)                          The eGFR has been calculated using the CKD EPI equation.                          This calculation has not been validated in all clinical situations.                          eGFR's persistently <90 mL/min signify possible Chronic Kidney                          Disease.  . Anion gap 05/12/2014 12  5 - 15 Final  . WBC 05/12/2014 7.0  4.0 - 10.5 K/uL Final  . RBC 05/12/2014 4.89  3.87 - 5.11 MIL/uL Final  . Hemoglobin 05/12/2014 14.8  12.0 - 15.0 g/dL Final  . HCT 05/12/2014 45.3  36.0 - 46.0 % Final  . MCV 05/12/2014 92.6  78.0 - 100.0 fL Final  . MCH 05/12/2014 30.3  26.0 - 34.0 pg Final  . MCHC 05/12/2014 32.7  30.0 - 36.0 g/dL Final  . RDW 05/12/2014 12.9  11.5 - 15.5 % Final  . Platelets 05/12/2014 243  150 - 400 K/uL Final  . Color, Urine 05/12/2014 YELLOW  YELLOW Final  . APPearance 05/12/2014 CLEAR  CLEAR Final  . Specific Gravity, Urine 05/12/2014 1.007  1.005 - 1.030 Final  . pH 05/12/2014 7.5  5.0 - 8.0 Final  . Glucose, UA 05/12/2014 NEGATIVE  NEGATIVE mg/dL Final  . Hgb urine dipstick 05/12/2014 NEGATIVE  NEGATIVE Final  . Bilirubin Urine 05/12/2014 NEGATIVE  NEGATIVE Final  . Ketones, ur 05/12/2014 NEGATIVE  NEGATIVE mg/dL Final  . Protein, ur 05/12/2014 NEGATIVE  NEGATIVE mg/dL Final  . Urobilinogen, UA 05/12/2014 0.2  0.0 - 1.0 mg/dL Final  . Nitrite 05/12/2014 NEGATIVE  NEGATIVE Final  . Leukocytes, UA 05/12/2014 NEGATIVE  NEGATIVE Final   MICROSCOPIC NOT DONE ON URINES WITH NEGATIVE PROTEIN, BLOOD, LEUKOCYTES, NITRITE, OR GLUCOSE <1000 mg/dL.   No results found for this basename: HGB,  in the last 72 hours No results found for this basename: WBC, RBC, HCT, PLT,  in the last 72 hours No results found for this  basename: NA, K, CL, CO2, BUN, CREATININE, GLUCOSE, CALCIUM,  in the last 72 hours No results found for this basename: LABPT, INR,  in the last 72 hours  X-Rays:Dg Lumbar Spine 2-3 Views  05/12/2014   CLINICAL DATA:  Preoperative radiographs for lumbar spine surgery.  EXAM: LUMBAR SPINE - 2-3 VIEW  COMPARISON:  MRI lumbar spine 05/02/2014.  FINDINGS: Five lumbar type vertebral bodies are present. There is a dextroconvex mild scoliosis with the apex at L 2. Vertebral body height preserved. Asymmetric disc space collapse present on the RIGHT at L4-L5. Multilevel spondylosis is present which is moderate. Notably, the loss of disc height at L4-L5 appears more prominent in the standing upright position when compared to  the prior MRI allowing for differences in modalities. Vertebral body height is preserved. No destructive osseous lesions.  IMPRESSION: Dextroconvex lumbar scoliosis.  Moderate degenerative disc disease.   Electronically Signed   By: Dereck Ligas M.D.   On: 05/12/2014 15:14   Dg Lumbar Spine Complete  05/02/2014   CLINICAL DATA:  Low back pain without injury  EXAM: LUMBAR SPINE - COMPLETE 4+ VIEW  COMPARISON:  None.  FINDINGS: Vertebral body height is well maintained. Mild osteophytic changes are seen. No spondylolysis or spondylolisthesis is seen. Scattered fecal material is noted throughout the colon. No soft tissue abnormality is noted.  IMPRESSION: Mild degenerative change without acute abnormality.   Electronically Signed   By: Inez Catalina M.D.   On: 05/02/2014 07:27   Mr Lumbar Spine Wo Contrast  05/02/2014   CLINICAL DATA:  Low back and left leg pain.  EXAM: MRI LUMBAR SPINE WITHOUT CONTRAST  TECHNIQUE: Multiplanar, multisequence MR imaging of the lumbar spine was performed. No intravenous contrast was administered.  COMPARISON:  Lumbar radiographs 05/02/2014.  FINDINGS: Normal alignment of the lumbar vertebral bodies. They demonstrate normal marrow signal except for mild endplate  reactive changes at L4-5 and a large L1 hemangioma. The conus medullaris terminates at the bottom of L1. The facets are normally aligned. No pars defects. No significant paraspinal or retroperitoneal findings.  L1-2:  No significant findings.  L2-3: Moderate sized broad-based right paracentral and right foraminal disc protrusion displacing the right L2 nerve root. There is also lateral recess encroachment on the right L3 nerve root. No spinal stenosis.  L3-4: Broad-based foraminal and extra foraminal disc protrusion on the left with an extruded disc fragment up behind the L3 vertebral body. This could be a free fragment. There is mass effect on the left side of the thecal sac and on the left L3 nerve root in the neural foramen.  L4-5: Shallow broad-based foraminal and extra foraminal disc protrusion on the right which appears to contact and slightly displace the right L4 nerve root. Mild facet disease. No spinal stenosis.  L5-S1:  No significant findings.  IMPRESSION: Right paracentral and right foraminal disc protrusion at L2-3 likely effecting both the right L2 and L3 nerve roots.  Foraminal and extra foraminal disc protrusion on the left at L3-4 with an extruded disc fragment coursing up behind the L3 vertebral body.  Shallow broad-based foraminal and extra foraminal disc protrusion on the right at L4-5.   Electronically Signed   By: Kalman Jewels M.D.   On: 05/02/2014 12:03   Dg Pelvis Portable  05/14/2014   CLINICAL DATA:  Left-sided pelvic pain.  Lumbar decompression.  EXAM: PORTABLE PELVIS 1-2 VIEWS  COMPARISON:  None.  FINDINGS: There is no evidence of acute fracture, subluxation or dislocation.  Mild degenerative changes within both hips noted.  No focal bony lesions are identified.  IMPRESSION: No acute bony abnormality.  Mild degenerative changes of both hips.   Electronically Signed   By: Hassan Rowan M.D.   On: 05/14/2014 10:08   Dg Spine Portable 1 View  05/14/2014   CLINICAL DATA:  48 year old  female for L2-L3 lumbar decompression.  EXAM: PORTABLE SPINE - 1 VIEW  COMPARISON:  Prior studies performed today.  FINDINGS: A single lateral view of the lumbar spine demonstrates a posterior metallic probe with tip just posterior to the L3 vertebral body and between the L2 and L3 spinous processes.  IMPRESSION: Localizer between the L2 and L3 spinous processes.   Electronically Signed  By: Hassan Rowan M.D.   On: 05/14/2014 09:00   Dg Spine Portable 1 View  05/14/2014   CLINICAL DATA:  48 year old female for L2-3 lumbar decompression.  EXAM: PORTABLE SPINE - 1 VIEW  COMPARISON:  Prior radiographs and MR  FINDINGS: Posterior metallic probes are noted overlying the upper L3 spinous process and between the spinous processes of L2 and L3.  IMPRESSION: Localizers as described.   Electronically Signed   By: Hassan Rowan M.D.   On: 05/14/2014 08:24   Dg Spine Portable 1 View  05/14/2014   CLINICAL DATA:  Lumbar decompression L2-3 possibly L3-4  EXAM: PORTABLE SPINE - 1 VIEW  COMPARISON:  None.  FINDINGS: Intraoperative localization cross-table lateral radiograph of the lumbar spine. 2 posterior metallic needles. The superior metallic needle projects just posterior to the L2 spinous process. The more inferior needle projects just posterior to the L3 spinous process.  IMPRESSION: Intraoperative localization radiograph as described above.   Electronically Signed   By: Kathreen Devoid   On: 05/14/2014 08:21    EKG:No orders found for this or any previous visit.   Hospital Course: Patient was admitted to [facility] and taken to the OR and underwent the above state procedure without complications.  Patient tolerated the procedure well and was later transferred to the recovery room and then to the orthopaedic floor for postoperative care.  They were given PO and IV analgesics for pain control following their surgery.  They were given 24 hours of postoperative antibiotics.   PT was consulted postop to assist with  mobility and transfers.  The patient was allowed to be WBAT with therapy and was taught back precautions. Discharge planning was consulted to help with postop disposition and equipment needs.  Patient had a good night on the evening of surgery and started to get up OOB with therapy on day one. Patient was seen in rounds and was ready to go home on day one.  They were given discharge instructions and dressing directions.  They were instructed on when to follow up in the office with Dr. Tonita Cong.   Diet: Regular diet Activity:WBAT; Lspine precautions Follow-up:in 10-14 days Disposition - Home Discharged Condition: good   Discharge Instructions   Call MD / Call 911    Complete by:  As directed   If you experience chest pain or shortness of breath, CALL 911 and be transported to the hospital emergency room.  If you develope a fever above 101 F, pus (white drainage) or increased drainage or redness at the wound, or calf pain, call your surgeon's office.     Constipation Prevention    Complete by:  As directed   Drink plenty of fluids.  Prune juice may be helpful.  You may use a stool softener, such as Colace (over the counter) 100 mg twice a day.  Use MiraLax (over the counter) for constipation as needed.     Diet - low sodium heart healthy    Complete by:  As directed      Increase activity slowly as tolerated    Complete by:  As directed             Medication List    STOP taking these medications       predniSONE 50 MG tablet  Commonly known as:  DELTASONE      TAKE these medications       docusate sodium 100 MG capsule  Commonly known as:  COLACE  Take 1  capsule (100 mg total) by mouth 2 (two) times daily as needed for mild constipation.     doxycycline 100 MG capsule  Commonly known as:  VIBRAMYCIN  Take 1 capsule (100 mg total) by mouth 2 (two) times daily.     estradiol 1 MG tablet  Commonly known as:  ESTRACE  Take 1 mg by mouth 2 (two) times daily.     ibuprofen 200 MG  tablet  Commonly known as:  ADVIL,MOTRIN  Take 800 mg by mouth daily as needed for headache or moderate pain.     LINZESS 145 MCG Caps capsule  Generic drug:  Linaclotide  Take 145 mcg by mouth every morning.     methocarbamol 500 MG tablet  Commonly known as:  ROBAXIN  Take 1 tablet (500 mg total) by mouth 3 (three) times daily between meals as needed for muscle spasms.     naproxen 500 MG tablet  Commonly known as:  NAPROSYN  Take 1 tablet (500 mg total) by mouth 2 (two) times daily.     nystatin 100000 UNIT/ML suspension  Commonly known as:  MYCOSTATIN  Take 5 mLs (500,000 Units total) by mouth 4 (four) times daily.     oxyCODONE-acetaminophen 7.5-325 MG per tablet  Commonly known as:  PERCOCET  Take 1 tablet by mouth every 4 (four) hours as needed for pain.     THERATEARS OP  Apply 1 drop to eye daily as needed (for dry eyes).     zolpidem 10 MG tablet  Commonly known as:  AMBIEN  Take 1 tablet (10 mg total) by mouth at bedtime as needed for sleep.           Follow-up Information   Follow up with BEANE,JEFFREY C, MD In 2 weeks. (For suture removal)    Specialty:  Orthopedic Surgery   Contact information:   [address] [place] [address] [phone]       Follow up with [facility]. (3n1 (commode))    Contact information:   [place] [address] [phone]       Signed: Lacie Draft, PA-C Orthopaedic Surgery 05/18/2014, 12:04 PM

## 2014-05-19 ENCOUNTER — Encounter (HOSPITAL_COMMUNITY): Payer: Self-pay | Admitting: Emergency Medicine

## 2014-05-19 ENCOUNTER — Emergency Department (HOSPITAL_COMMUNITY): Payer: Managed Care, Other (non HMO)

## 2014-05-19 DIAGNOSIS — R0602 Shortness of breath: Secondary | ICD-10-CM | POA: Diagnosis present

## 2014-05-19 LAB — BASIC METABOLIC PANEL
ANION GAP: 15 (ref 5–15)
BUN: 14 mg/dL (ref 6–23)
CALCIUM: 9.9 mg/dL (ref 8.4–10.5)
CO2: 23 mEq/L (ref 19–32)
Chloride: 101 mEq/L (ref 96–112)
Creatinine, Ser: 0.63 mg/dL (ref 0.50–1.10)
GFR calc Af Amer: >90 mL/min (ref >90)
Glucose, Bld: 93 mg/dL (ref 70–99)
Potassium: 3.7 mEq/L (ref 3.7–5.3)
SODIUM: 139 meq/L (ref 137–147)

## 2014-05-19 LAB — CBC WITH DIFFERENTIAL/PLATELET
Basophils Absolute: 0 10*3/uL (ref 0.0–0.1)
Basophils Relative: 0 % (ref 0–1)
Eosinophils Absolute: 0.1 10*3/uL (ref 0.0–0.7)
Eosinophils Relative: 1 % (ref 0–5)
HCT: 41.6 % (ref 36.0–46.0)
Hemoglobin: 14.2 g/dL (ref 12.0–15.0)
Lymphocytes Relative: 29 % (ref 12–46)
Lymphs Abs: 2.7 10*3/uL (ref 0.7–4.0)
MCH: 31.2 pg (ref 26.0–34.0)
MCHC: 34.1 g/dL (ref 30.0–36.0)
MCV: 91.4 fL (ref 78.0–100.0)
Monocytes Absolute: 0.5 10*3/uL (ref 0.1–1.0)
Monocytes Relative: 5 % (ref 3–12)
Neutro Abs: 5.9 10*3/uL (ref 1.7–7.7)
Neutrophils Relative %: 65 % (ref 43–77)
Platelets: 230 10*3/uL (ref 150–400)
RBC: 4.55 MIL/uL (ref 3.87–5.11)
RDW: 12.9 % (ref 11.5–15.5)
WBC: 9.2 10*3/uL (ref 4.0–10.5)

## 2014-05-19 LAB — D-DIMER, QUANTITATIVE (NOT AT ARMC): D-Dimer, Quant: 0.94 ug/mL-FEU — ABNORMAL HIGH (ref 0.00–0.48)

## 2014-05-19 LAB — TROPONIN I: Troponin I: <0.3 ng/mL (ref <0.30)

## 2014-05-19 NOTE — ED Notes (Signed)
Feels "hot and cold" feels sob.  No sore throat.  Had back surgery 9/30.

## 2014-05-20 ENCOUNTER — Emergency Department (HOSPITAL_COMMUNITY)
Admission: EM | Admit: 2014-05-20 | Discharge: 2014-05-20 | Discharge status: Against Medical Advice | Payer: Managed Care, Other (non HMO) | Attending: Emergency Medicine | Admitting: Emergency Medicine

## 2014-05-20 NOTE — ED Notes (Signed)
Called patient from waiting room, no answer. x1.

## 2014-05-21 ENCOUNTER — Telehealth: Payer: Self-pay | Admitting: Family Medicine

## 2014-05-21 NOTE — Telephone Encounter (Signed)
Patient advised to call [facility] since they are the ones who done the surgery

## 2014-05-30 ENCOUNTER — Encounter: Payer: Self-pay | Admitting: Medical Oncology

## 2014-05-30 ENCOUNTER — Other Ambulatory Visit: Payer: Managed Care, Other (non HMO)

## 2014-05-30 ENCOUNTER — Ambulatory Visit: Payer: Managed Care, Other (non HMO) | Admitting: Oncology

## 2014-05-30 NOTE — Progress Notes (Signed)
Patient missed today's appts.   POF sent to scheduling for patient to be r/s.

## 2014-06-02 ENCOUNTER — Ambulatory Visit: Payer: Managed Care, Other (non HMO) | Attending: Specialist | Admitting: Physical Therapy

## 2014-06-02 DIAGNOSIS — M7070 Other bursitis of hip, unspecified hip: Secondary | ICD-10-CM | POA: Insufficient documentation

## 2014-06-02 DIAGNOSIS — M5136 Other intervertebral disc degeneration, lumbar region: Secondary | ICD-10-CM | POA: Insufficient documentation

## 2014-06-02 DIAGNOSIS — Z5189 Encounter for other specified aftercare: Secondary | ICD-10-CM | POA: Insufficient documentation

## 2014-06-02 DIAGNOSIS — Z9889 Other specified postprocedural states: Secondary | ICD-10-CM | POA: Insufficient documentation

## 2014-06-02 DIAGNOSIS — M545 Low back pain: Secondary | ICD-10-CM | POA: Diagnosis not present

## 2014-06-04 ENCOUNTER — Ambulatory Visit: Payer: Managed Care, Other (non HMO) | Admitting: Physical Therapy

## 2014-06-04 DIAGNOSIS — Z5189 Encounter for other specified aftercare: Secondary | ICD-10-CM | POA: Diagnosis not present

## 2014-06-09 ENCOUNTER — Ambulatory Visit: Payer: Managed Care, Other (non HMO) | Admitting: Physical Therapy

## 2014-06-09 DIAGNOSIS — Z5189 Encounter for other specified aftercare: Secondary | ICD-10-CM | POA: Diagnosis not present

## 2014-06-11 ENCOUNTER — Ambulatory Visit: Payer: Managed Care, Other (non HMO) | Admitting: Physical Therapy

## 2014-06-11 DIAGNOSIS — Z5189 Encounter for other specified aftercare: Secondary | ICD-10-CM | POA: Diagnosis not present

## 2014-06-17 ENCOUNTER — Ambulatory Visit: Payer: Managed Care, Other (non HMO) | Attending: Specialist | Admitting: Physical Therapy

## 2014-06-17 DIAGNOSIS — M7070 Other bursitis of hip, unspecified hip: Secondary | ICD-10-CM | POA: Insufficient documentation

## 2014-06-17 DIAGNOSIS — M545 Low back pain: Secondary | ICD-10-CM | POA: Diagnosis not present

## 2014-06-17 DIAGNOSIS — M5136 Other intervertebral disc degeneration, lumbar region: Secondary | ICD-10-CM | POA: Diagnosis not present

## 2014-06-17 DIAGNOSIS — Z5189 Encounter for other specified aftercare: Secondary | ICD-10-CM | POA: Diagnosis not present

## 2014-06-17 DIAGNOSIS — Z9889 Other specified postprocedural states: Secondary | ICD-10-CM | POA: Diagnosis not present

## 2014-06-19 ENCOUNTER — Ambulatory Visit: Payer: Managed Care, Other (non HMO) | Admitting: Physical Therapy

## 2014-06-19 DIAGNOSIS — Z5189 Encounter for other specified aftercare: Secondary | ICD-10-CM | POA: Diagnosis not present

## 2014-06-20 ENCOUNTER — Ambulatory Visit (HOSPITAL_BASED_OUTPATIENT_CLINIC_OR_DEPARTMENT_OTHER): Payer: Managed Care, Other (non HMO) | Admitting: Oncology

## 2014-06-20 ENCOUNTER — Other Ambulatory Visit: Payer: Managed Care, Other (non HMO)

## 2014-06-20 ENCOUNTER — Telehealth: Payer: Self-pay | Admitting: Oncology

## 2014-06-20 VITALS — BP 136/80 | HR 84 | Temp 98.3°F | Resp 18 | Ht 66.0 in | Wt 204.5 lb

## 2014-06-20 DIAGNOSIS — C20 Malignant neoplasm of rectum: Secondary | ICD-10-CM

## 2014-06-20 DIAGNOSIS — K649 Unspecified hemorrhoids: Secondary | ICD-10-CM

## 2014-06-20 DIAGNOSIS — K5900 Constipation, unspecified: Secondary | ICD-10-CM

## 2014-06-20 NOTE — Addendum Note (Signed)
Addended by: Randolm Idol on: 06/20/2014 04:28 PM   Modules accepted: Medications

## 2014-06-20 NOTE — Telephone Encounter (Signed)
gv adn printed appt sched and avs for pt for May...sed gave barium

## 2014-06-20 NOTE — Progress Notes (Signed)
Hematology and Oncology Follow Up Visit  [name] [id number] [birth year] 48 y.o. 06/20/2014 4:00 PM   Principle Diagnosis: 48 year old diagnosed with T2N0 rectal cancer in 05/2012. Her tumor was 7-10 cm from the anal verge.    Prior Therapy: Patient is S/P laparoscopic LAR done on 07/02/2012. The pathology reveled: INVASIVE MODERATELY DIFFERENTIATED ADENOCARCINOMA INVADING INTO BUT NOTTHROUGH THE MUSCULARIS PROPRIA. - NO ANGIOLYMPHATIC INVASION IDENTIFIED. - SIXTEEN LYMPH NODES WITH FOCAL ENDOSALPINGIOSIS, NO EVIDENCE OF METASTATIC CARCINOMA(0/16). - Her tumor is Microsatellite stable.   Current therapy: Observation and follow up.   Interim History: [name] presents for a follow up visit. Since the last visit, she underwent lumbar surgery on 05/14/2014. She is recovering quite nicely at this time. She still sore but is improving daily. She is doing well at this time. She still reports constipation and sensation of fullness in her rectal area. She still requires laxatives to move her bowels on a regular basis. She does not report any hematochezia or melena. She does not report any abdominal pain or early satiety. She does not report any headaches or blurry vision or syncope. She does not report any fevers chills or sweats. Her appetite continue to be excellent. She has not reported any frequency urgency or hesitancy. She has not resumed her work-related duties at this time but hoping to do so in the near future. Rest of her review of systems unremarkable.  Medications: I have reviewed the patient's current medications.  Current Outpatient Prescriptions  Medication Sig Dispense Refill  . Carboxymethylcellulose Sodium (THERATEARS OP) Apply 1 drop to eye daily as needed (for dry eyes).    Marland Kitchen docusate sodium (COLACE) 100 MG capsule Take 1 capsule (100 mg total) by mouth 2 (two) times daily as needed for mild constipation. 20 capsule 1  . doxycycline (VIBRAMYCIN) 100 MG capsule Take 1  capsule (100 mg total) by mouth 2 (two) times daily. 20 capsule 1  . estradiol (ESTRACE) 1 MG tablet Take 1 mg by mouth 2 (two) times daily.    Marland Kitchen ibuprofen (ADVIL,MOTRIN) 200 MG tablet Take 800 mg by mouth daily as needed for headache or moderate pain.    . Linaclotide (LINZESS) 145 MCG CAPS capsule Take 145 mcg by mouth every morning.    . methocarbamol (ROBAXIN) 500 MG tablet Take 1 tablet (500 mg total) by mouth 3 (three) times daily between meals as needed for muscle spasms. 40 tablet 1  . naproxen (NAPROSYN) 500 MG tablet Take 1 tablet (500 mg total) by mouth 2 (two) times daily. 20 tablet 0  . nystatin (MYCOSTATIN) 100000 UNIT/ML suspension Take 5 mLs (500,000 Units total) by mouth 4 (four) times daily. 60 mL 0  . oxyCODONE-acetaminophen (PERCOCET) 7.5-325 MG per tablet Take 1 tablet by mouth every 4 (four) hours as needed for pain. 60 tablet 0  . zolpidem (AMBIEN) 10 MG tablet Take 1 tablet (10 mg total) by mouth at bedtime as needed for sleep. 30 tablet 1   No current facility-administered medications for this visit.    Allergies:  Allergies  Allergen Reactions  . Codeine Nausea Only    Confirmed intolerance of codeine verbally with pt in PACU, pt states does not cause any SOB or dyspnea, some nausea only (MN-RN)  . Ultram [Tramadol] Nausea And Vomiting    Ultram allergy also discussed, active N&V occurs with Ultram  . Latex Itching and Rash    Past Medical History, Surgical history, Social history, and Family History were reviewed and updated.  Review of Systems:  Remaining ROS negative. Physical Exam:  Blood pressure 136/80, pulse 84, temperature 98.3 F (36.8 C), temperature source Oral, resp. rate 18, height 5' 6"  (1.676 m), weight 204 lb 8 oz (92.761 kg). ECOG: 1 General appearance: alert Head: Normocephalic, without obvious abnormality, atraumatic Neck: no adenopathy Lymph nodes: Cervical, supraclavicular, and axillary nodes normal. Heart:regular rate and rhythm,  S1, S2 normal, no murmur, click, rub or gallop Lung:chest clear, no wheezing, rales, normal symmetric air entry Abdomin: soft, non-tender, without masses or organomegaly EXT:no erythema, induration, or nodules   Lab Results: Lab Results  Component Value Date   WBC 9.2 05/19/2014   HGB 14.2 05/19/2014   HCT 41.6 05/19/2014   MCV 91.4 05/19/2014   PLT 230 05/19/2014     Chemistry      Component Value Date/Time   NA 139 05/19/2014 2238   NA 144 11/29/2013 1015   K 3.7 05/19/2014 2238   K 4.0 11/29/2013 1015   CL 101 05/19/2014 2238   CL 103 11/19/2012 1005   CO2 23 05/19/2014 2238   CO2 26 11/29/2013 1015   BUN 14 05/19/2014 2238   BUN 6.6* 11/29/2013 1015   CREATININE 0.63 05/19/2014 2238   CREATININE 0.7 11/29/2013 1015      Component Value Date/Time   CALCIUM 9.9 05/19/2014 2238   CALCIUM 9.5 11/29/2013 1015   ALKPHOS 54 11/29/2013 1015   ALKPHOS 48 06/01/2012 1452   AST 16 11/29/2013 1015   AST 16 06/01/2012 1452   ALT 10 11/29/2013 1015   ALT 11 06/01/2012 1452   BILITOT 0.47 11/29/2013 1015   BILITOT 0.4 06/01/2012 1452      Impression and Plan:  48 year old with the following issues:  1. T2N0 rectal cancer diagnosed in 05/2012. She is S/P LAR in 06/2012. CT scan and labs from  11/29/2013  do not show any cancer relapse. The plan is to continue with active surveillance and repeat imaging studies in 6 months. I first CT scan is clear from malignancy next visit, we will consider stopping any further imaging studies at that time.    2. Colonoscopic screen: She is up-to-date at this point and her repeat colonoscopy will be in 3 years.  3. Hemorrhoids: she is using over the counter preparations follows up with surgery regarding that.  4. Constipation: Seems to be a chronic problem and manageable with laxatives. I gave her recommendations regarding high fiber diet and stool softeners.  5. Follow-up: Will be in 6 months with a CT scan at this  time.    XYOFVW,AQLRJ, MD 11/6/20154:00 PM

## 2014-06-23 ENCOUNTER — Ambulatory Visit: Payer: Managed Care, Other (non HMO) | Admitting: Physical Therapy

## 2014-06-23 DIAGNOSIS — Z5189 Encounter for other specified aftercare: Secondary | ICD-10-CM | POA: Diagnosis not present

## 2014-06-25 ENCOUNTER — Telehealth: Payer: Self-pay | Admitting: Oncology

## 2014-06-25 ENCOUNTER — Encounter: Payer: Managed Care, Other (non HMO) | Admitting: Physical Therapy

## 2014-06-25 NOTE — Telephone Encounter (Signed)
recieved call from Iylah at [facility] 06/23/14 - pt has [employer] ins and ct for 12/16/14 should be scheduled at [facility]. s/w gayle @ [facility] today and scheduled ct for 12/16/14 @ 9:30am @ [place] [address]. pt to get ct prep anytime prior to 12/16/14. s/w pt today she is aware. other appts remain the same and confirmed w/pt she will still have lab in our office 12/16/14 @ 8am prior to scan @ [facility].

## 2014-06-27 ENCOUNTER — Encounter: Payer: Managed Care, Other (non HMO) | Admitting: Physical Therapy

## 2014-07-04 ENCOUNTER — Ambulatory Visit: Payer: Managed Care, Other (non HMO) | Admitting: Physical Therapy

## 2014-07-04 DIAGNOSIS — Z5189 Encounter for other specified aftercare: Secondary | ICD-10-CM | POA: Diagnosis not present

## 2014-07-07 ENCOUNTER — Ambulatory Visit: Payer: Managed Care, Other (non HMO) | Admitting: *Deleted

## 2014-07-07 DIAGNOSIS — Z5189 Encounter for other specified aftercare: Secondary | ICD-10-CM | POA: Diagnosis not present

## 2014-07-12 ENCOUNTER — Other Ambulatory Visit: Payer: Self-pay | Admitting: Nurse Practitioner

## 2014-07-13 NOTE — Telephone Encounter (Signed)
Last seen 04/18/2014, last filled 05/23/14. Call into [facility]

## 2014-07-14 ENCOUNTER — Ambulatory Visit: Payer: Managed Care, Other (non HMO) | Admitting: Physical Therapy

## 2014-07-14 DIAGNOSIS — Z5189 Encounter for other specified aftercare: Secondary | ICD-10-CM | POA: Diagnosis not present

## 2014-07-14 NOTE — Telephone Encounter (Signed)
Please call in ambien 10mg 1 po Qhs #30  with 1 refills 

## 2014-07-17 ENCOUNTER — Ambulatory Visit: Payer: Managed Care, Other (non HMO) | Attending: Specialist | Admitting: Physical Therapy

## 2014-07-17 DIAGNOSIS — Z9889 Other specified postprocedural states: Secondary | ICD-10-CM | POA: Diagnosis not present

## 2014-07-17 DIAGNOSIS — Z5189 Encounter for other specified aftercare: Secondary | ICD-10-CM | POA: Diagnosis present

## 2014-07-17 DIAGNOSIS — M5136 Other intervertebral disc degeneration, lumbar region: Secondary | ICD-10-CM | POA: Diagnosis not present

## 2014-07-17 DIAGNOSIS — M7070 Other bursitis of hip, unspecified hip: Secondary | ICD-10-CM | POA: Insufficient documentation

## 2014-07-17 DIAGNOSIS — M545 Low back pain: Secondary | ICD-10-CM | POA: Diagnosis not present

## 2014-07-21 ENCOUNTER — Ambulatory Visit: Payer: Managed Care, Other (non HMO) | Admitting: Physical Therapy

## 2014-07-21 DIAGNOSIS — Z5189 Encounter for other specified aftercare: Secondary | ICD-10-CM | POA: Diagnosis not present

## 2014-07-25 ENCOUNTER — Encounter: Payer: Self-pay | Admitting: Family Medicine

## 2014-07-25 ENCOUNTER — Ambulatory Visit (INDEPENDENT_AMBULATORY_CARE_PROVIDER_SITE_OTHER): Payer: Managed Care, Other (non HMO) | Admitting: Family Medicine

## 2014-07-25 VITALS — BP 117/69 | HR 92 | Temp 98.6°F | Ht 66.0 in | Wt 204.8 lb

## 2014-07-25 DIAGNOSIS — J069 Acute upper respiratory infection, unspecified: Secondary | ICD-10-CM

## 2014-07-25 LAB — POCT INFLUENZA A/B
Influenza A, POC: NEGATIVE
Influenza B, POC: NEGATIVE

## 2014-07-25 MED ORDER — AZITHROMYCIN 250 MG PO TABS: ORAL_TABLET | ORAL | Status: DC

## 2014-07-25 NOTE — Progress Notes (Signed)
   Subjective:    Patient ID: [name], female    DOB: 1965-08-20, 48 y.o.   MRN: [id number]  HPI C/o uri sx's  Review of Systems  Constitutional: Negative for fever.  HENT: Negative for ear pain.   Eyes: Negative for discharge.  Respiratory: Negative for cough.   Cardiovascular: Negative for chest pain.  Gastrointestinal: Negative for abdominal distention.  Endocrine: Negative for polyuria.  Genitourinary: Negative for difficulty urinating.  Musculoskeletal: Negative for gait problem and neck pain.  Skin: Negative for color change and rash.  Neurological: Negative for speech difficulty and headaches.  Psychiatric/Behavioral: Negative for agitation.       Objective:    BP 117/69 mmHg  Pulse 92  Temp(Src) 98.6 F (37 C) (Oral)  Ht 5\' 6"  (1.676 m)  Wt 204 lb 12.8 oz (92.897 kg)  BMI 33.07 kg/m2 Physical Exam  Constitutional: She is oriented to person, place, and time. She appears well-developed and well-nourished.  HENT:  Head: Normocephalic and atraumatic.  Mouth/Throat: Oropharynx is clear and moist.  Eyes: Pupils are equal, round, and reactive to light.  Neck: Normal range of motion. Neck supple.  Cardiovascular: Normal rate and regular rhythm.   No murmur heard. Pulmonary/Chest: Effort normal and breath sounds normal.  Abdominal: Soft. Bowel sounds are normal. There is no tenderness.  Neurological: She is alert and oriented to person, place, and time.  Skin: Skin is warm and dry.  Psychiatric: She has a normal mood and affect.          Assessment & Plan:     ICD-9-CM ICD-10-CM   1. URI (upper respiratory infection) 465.9 J06.9 POCT Influenza A/B     No Follow-up on file.  Lysbeth Penner FNP

## 2014-07-28 ENCOUNTER — Ambulatory Visit: Payer: Managed Care, Other (non HMO) | Admitting: Physical Therapy

## 2014-07-28 DIAGNOSIS — Z5189 Encounter for other specified aftercare: Secondary | ICD-10-CM | POA: Diagnosis not present

## 2014-07-31 ENCOUNTER — Encounter: Payer: Managed Care, Other (non HMO) | Admitting: Physical Therapy

## 2014-08-04 ENCOUNTER — Other Ambulatory Visit: Payer: Self-pay | Admitting: Nurse Practitioner

## 2014-08-05 NOTE — Telephone Encounter (Signed)
Please advise on refill.  Last seen by [facility] 04/18/14. No follow up scheduled.

## 2014-08-05 NOTE — Telephone Encounter (Signed)
Please call in ambien with 1 refills 

## 2014-08-06 NOTE — Telephone Encounter (Signed)
ambien called into [facility] voicemail.

## 2014-09-15 ENCOUNTER — Other Ambulatory Visit: Payer: Self-pay | Admitting: Nurse Practitioner

## 2014-10-14 ENCOUNTER — Telehealth: Payer: Self-pay | Admitting: Nurse Practitioner

## 2014-10-22 NOTE — Telephone Encounter (Signed)
Spoke with pt Rash is resolved She went to urgent care

## 2014-11-10 ENCOUNTER — Other Ambulatory Visit: Payer: Self-pay | Admitting: Nurse Practitioner

## 2014-11-11 NOTE — Telephone Encounter (Signed)
rx called into pharmacy

## 2014-11-11 NOTE — Telephone Encounter (Signed)
Please call in ambien with 1 refills 

## 2014-11-11 NOTE — Telephone Encounter (Signed)
Last seen 07/25/14 [facility] [place]   If approved route to nurse to call into [facility]

## 2014-12-12 ENCOUNTER — Ambulatory Visit
Admission: RE | Admit: 2014-12-12 | Discharge: 2014-12-12 | Disposition: A | Discharge status: Home / Self Care | Payer: Managed Care, Other (non HMO) | Source: Ambulatory Visit | Attending: Oncology | Admitting: Oncology

## 2014-12-12 ENCOUNTER — Other Ambulatory Visit (HOSPITAL_BASED_OUTPATIENT_CLINIC_OR_DEPARTMENT_OTHER): Payer: Managed Care, Other (non HMO)

## 2014-12-12 DIAGNOSIS — C20 Malignant neoplasm of rectum: Secondary | ICD-10-CM

## 2014-12-12 LAB — COMPREHENSIVE METABOLIC PANEL (CC13)
ALBUMIN: 3.5 g/dL (ref 3.5–5.0)
ALT: 16 U/L (ref 0–55)
AST: 17 U/L (ref 5–34)
Alkaline Phosphatase: 61 U/L (ref 40–150)
Anion Gap: 11 mEq/L (ref 3–11)
BUN: 7.7 mg/dL (ref 7.0–26.0)
CHLORIDE: 105 meq/L (ref 98–109)
CO2: 25 mEq/L (ref 22–29)
Calcium: 9.3 mg/dL (ref 8.4–10.4)
Creatinine: 0.7 mg/dL (ref 0.6–1.1)
EGFR: >90 mL/min/{1.73_m2} (ref >90)
Glucose: 83 mg/dl (ref 70–140)
POTASSIUM: 4.1 meq/L (ref 3.5–5.1)
SODIUM: 141 meq/L (ref 136–145)
TOTAL PROTEIN: 6.9 g/dL (ref 6.4–8.3)
Total Bilirubin: 0.35 mg/dL (ref 0.20–1.20)

## 2014-12-12 LAB — CBC WITH DIFFERENTIAL/PLATELET
BASO%: 0.2 % (ref 0.0–2.0)
Basophils Absolute: 0 10*3/uL (ref 0.0–0.1)
EOS%: 2.3 % (ref 0.0–7.0)
Eosinophils Absolute: 0.1 10*3/uL (ref 0.0–0.5)
HEMATOCRIT: 42.5 % (ref 34.8–46.6)
HGB: 14.2 g/dL (ref 11.6–15.9)
LYMPH%: 34.6 % (ref 14.0–49.7)
MCH: 30.7 pg (ref 25.1–34.0)
MCHC: 33.4 g/dL (ref 31.5–36.0)
MCV: 92 fL (ref 79.5–101.0)
MONO#: 0.4 10*3/uL (ref 0.1–0.9)
MONO%: 7.8 % (ref 0.0–14.0)
NEUT#: 2.6 10*3/uL (ref 1.5–6.5)
NEUT%: 55.1 % (ref 38.4–76.8)
PLATELETS: 197 10*3/uL (ref 145–400)
RBC: 4.62 10*6/uL (ref 3.70–5.45)
RDW: 12.8 % (ref 11.2–14.5)
WBC: 4.7 10*3/uL (ref 3.9–10.3)
lymph#: 1.6 10*3/uL (ref 0.9–3.3)

## 2014-12-12 MED ORDER — IOPAMIDOL (ISOVUE-300) INJECTION 61%
100.0000 mL | Freq: Once | INTRAVENOUS | Status: AC | PRN
Start: 2014-12-12 — End: 2014-12-12
  Administered 2014-12-12: 100 mL via INTRAVENOUS

## 2014-12-15 LAB — CEA: CEA: 0.9 ng/mL (ref 0.0–5.0)

## 2014-12-16 ENCOUNTER — Other Ambulatory Visit: Payer: Managed Care, Other (non HMO)

## 2014-12-19 ENCOUNTER — Ambulatory Visit (HOSPITAL_BASED_OUTPATIENT_CLINIC_OR_DEPARTMENT_OTHER): Payer: Managed Care, Other (non HMO) | Admitting: Oncology

## 2014-12-19 ENCOUNTER — Telehealth: Payer: Self-pay | Admitting: Oncology

## 2014-12-19 VITALS — BP 149/67 | HR 66 | Resp 18

## 2014-12-19 DIAGNOSIS — K5900 Constipation, unspecified: Secondary | ICD-10-CM

## 2014-12-19 DIAGNOSIS — K649 Unspecified hemorrhoids: Secondary | ICD-10-CM

## 2014-12-19 DIAGNOSIS — C20 Malignant neoplasm of rectum: Secondary | ICD-10-CM

## 2014-12-19 NOTE — Progress Notes (Signed)
Hematology and Oncology Follow Up Visit  [name] [id number] [birth year] 49 y.o. 12/19/2014 9:11 AM   Principle Diagnosis: 49 year old diagnosed with T2N0 rectal cancer in 05/2012. Her tumor was 7-10 cm from the anal verge.    Prior Therapy: Patient is S/P laparoscopic LAR done on 07/02/2012. The pathology reveled: INVASIVE MODERATELY DIFFERENTIATED ADENOCARCINOMA INVADING INTO BUT NOTTHROUGH THE MUSCULARIS PROPRIA. - NO ANGIOLYMPHATIC INVASION IDENTIFIED. - SIXTEEN LYMPH NODES WITH FOCAL ENDOSALPINGIOSIS, NO EVIDENCE OF METASTATIC CARCINOMA(0/16). - Her tumor is Microsatellite stable.   Current therapy: Observation and follow up.   Interim History: [name] presents for a follow up visit with her mother. Since the last visit, she reports no new complaints. She still reports constipation and sensation of fullness in her rectal area. She still requires laxatives to move her bowels on a regular basis. She is no longer reporting any back pain or back issues. He recovered from her surgery previously. She does not report any hematochezia or melena. She does not report any abdominal pain or early satiety. She does not report any headaches or blurry vision or syncope. She does not report any fevers chills or sweats. Her appetite continue to be excellent. She has not reported any frequency urgency or hesitancy. She has not resumed her work-related duties at this time but hoping to do so in the near future. Rest of her review of systems unremarkable.  Medications: I have reviewed the patient's current medications.  Current Outpatient Prescriptions  Medication Sig Dispense Refill  . cyclobenzaprine (FLEXERIL) 10 MG tablet   1  . docusate sodium (COLACE) 100 MG capsule Take 1 capsule (100 mg total) by mouth 2 (two) times daily as needed for mild constipation. 20 capsule 1  . estradiol (ESTRACE) 1 MG tablet Take 1 mg by mouth 2 (two) times daily.    Marland Kitchen estradiol (ESTRACE) 1 MG tablet TAKE 1 TABLET  TWICE A DAY 60 tablet 5  . Linaclotide (LINZESS) 145 MCG CAPS capsule Take 145 mcg by mouth every morning.    Marland Kitchen oxyCODONE-acetaminophen (PERCOCET) 7.5-325 MG per tablet Take 1 tablet by mouth every 4 (four) hours as needed for pain. 60 tablet 0  . zolpidem (AMBIEN) 10 MG tablet TAKE 1 TABLET AT BEDTIME AS NEEDED FOR SLEEP 30 tablet 1   No current facility-administered medications for this visit.    Allergies:  Allergies  Allergen Reactions  . Codeine Nausea Only    Confirmed intolerance of codeine verbally with pt in PACU, pt states does not cause any SOB or dyspnea, some nausea only (MN-RN)  . Ultram [Tramadol] Nausea And Vomiting    Ultram allergy also discussed, active N&V occurs with Ultram  . Latex Itching and Rash    Past Medical History, Surgical history, Social history, and Family History were reviewed and updated.   Physical Exam:  There were no vitals taken for this visit. ECOG: 1 General appearance: alert Head: Normocephalic, without obvious abnormality, atraumatic Neck: no adenopathy Lymph nodes: Cervical, supraclavicular, and axillary nodes normal. Heart:regular rate and rhythm, S1, S2 normal, no murmur, click, rub or gallop Lung:chest clear, no wheezing, rales, normal symmetric air entry Abdomin: soft, non-tender, without masses or organomegaly EXT:no erythema, induration, or nodules   Lab Results: Lab Results  Component Value Date   WBC 4.7 12/12/2014   HGB 14.2 12/12/2014   HCT 42.5 12/12/2014   MCV 92.0 12/12/2014   PLT 197 12/12/2014     Chemistry      Component Value Date/Time  NA 141 12/12/2014 0806   NA 139 05/19/2014 2238   K 4.1 12/12/2014 0806   K 3.7 05/19/2014 2238   CL 101 05/19/2014 2238   CL 103 11/19/2012 1005   CO2 25 12/12/2014 0806   CO2 23 05/19/2014 2238   BUN 7.7 12/12/2014 0806   BUN 14 05/19/2014 2238   CREATININE 0.7 12/12/2014 0806   CREATININE 0.63 05/19/2014 2238      Component Value Date/Time   CALCIUM 9.3  12/12/2014 0806   CALCIUM 9.9 05/19/2014 2238   ALKPHOS 61 12/12/2014 0806   ALKPHOS 48 06/01/2012 1452   AST 17 12/12/2014 0806   AST 16 06/01/2012 1452   ALT 16 12/12/2014 0806   ALT 11 06/01/2012 1452   BILITOT 0.35 12/12/2014 0806   BILITOT 0.4 06/01/2012 1452       EXAM: CT CHEST, ABDOMEN, AND PELVIS WITH CONTRAST  TECHNIQUE: Multidetector CT imaging of the chest, abdomen and pelvis was performed following the standard protocol during bolus administration of intravenous contrast.  CONTRAST: 100 cc Isovue 300 IV contrast  COMPARISON: 11/29/2013  FINDINGS: CT CHEST FINDINGS  Mediastinum/Nodes: Great vessels are normal in caliber. Heart size normal. No pericardial effusion. No lymphadenopathy.  Lungs/Pleura: Lungs are clear. Central airways are patent. No pleural effusion.  Upper abdomen: Please see dedicated report below.  Musculoskeletal: L1 probable vertebral body hemangioma reidentified. No new lytic or sclerotic osseous lesion or acute osseous abnormality.  CT ABDOMEN AND PELVIS FINDINGS  Hepato biliary: 1 cm hypodense lesion at the dome of the right hepatic lobe image 43 is stable since prior exam 05/24/2013 with suggestion of peripheral discontiguous nodular enhancement likely indicating a stable hemangioma. No new hepatic mass or intrahepatic ductal dilatation is identified. The gallbladder is unremarkable.  Pancreas: Normal  Spleen: Normal  Adrenals/Urinary Tract: Adrenal glands are unremarkable. Kidneys appear normal. No hydroureteronephrosis.  Stomach/Bowel: No bowel wall thickening or focal segmental dilatation is identified. The appendix is normal.  Vascular/Lymphatic: No aortic aneurysm. No lymphadenopathy.  Reproductive: Evidence of hysterectomy and left oophorectomy. Right ovary appears normal.  Other: No free air or fluid.  Musculoskeletal: No acute osseous abnormality. Lumbar spine disc degenerative change  noted.  IMPRESSION: No CT evidence for intra-abdominal or pelvic metastatic disease.  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 No further specific imaging followup is needed for this likely benign finding, apart from routine surveillance restaging exams.   Electronically Signed  By: Conchita Paris M.D.  On: 12/12/2014 10:46           Vitals     Height Weight BMI (Calculated)    5' 6"  (1.676 m) 183 lb (83.008 kg) 29.6      Interpretation Summary     CLINICAL DATA: Rectal carcinoma, restaging. Diagnosed 2013. Prior surgery including partial colectomy and hysterectomy.  EXAM: CT CHEST, ABDOMEN, AND PELVIS WITH CONTRAST  TECHNIQUE: Multidetector CT imaging of the chest, abdomen and pelvis was performed following the standard protocol during bolus administration of intravenous contrast.  CONTRAST: 100 cc Isovue 300 IV contrast  COMPARISON: 11/29/2013  FINDINGS: CT CHEST FINDINGS  Mediastinum/Nodes: Great vessels are normal in caliber. Heart size normal. No pericardial effusion. No lymphadenopathy.  Lungs/Pleura: Lungs are clear. Central airways are patent. No pleural effusion.  Upper abdomen: Please see dedicated report below.  Musculoskeletal: L1 probable vertebral body hemangioma reidentified. No new lytic or sclerotic osseous lesion or acute osseous abnormality.  CT ABDOMEN AND PELVIS FINDINGS  Hepato biliary: 1 cm hypodense lesion at the dome of  the right hepatic lobe image 43 is stable since prior exam 05/24/2013 with suggestion of peripheral discontiguous nodular enhancement likely indicating a stable hemangioma. No new hepatic mass or intrahepatic ductal dilatation is identified. The gallbladder is unremarkable.  Pancreas: Normal  Spleen: Normal  Adrenals/Urinary Tract: Adrenal glands are unremarkable. Kidneys appear normal. No hydroureteronephrosis.  Stomach/Bowel: No bowel wall thickening or focal  segmental dilatation is identified. The appendix is normal.  Vascular/Lymphatic: No aortic aneurysm. No lymphadenopathy.  Reproductive: Evidence of hysterectomy and left oophorectomy. Right ovary appears normal.  Other: No free air or fluid.  Musculoskeletal: No acute osseous abnormality. Lumbar spine disc degenerative change noted.  IMPRESSION: No CT evidence for intra-abdominal or pelvic metastatic disease.  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 No further specific imaging followup is needed for this likely benign finding, apart from routine surveillance restaging exams.       Impression and Plan:  49 year old with the following issues:  1. T2N0 rectal cancer diagnosed in 05/2012. She is S/P LAR in 06/2012. CT scan and labs from  12/12/2014 was reviewed today and discussed with the patient and  do not show any cancer relapse. The plan is to continue with active surveillance and repeat imaging studies in 12 months. She will not require any further follow-up beyond that.    2. Colonoscopic screen: She is up-to-date at this point and her repeat colonoscopy will be in 2 years.  3. Hemorrhoids: she is using over the counter preparations follows up with surgery regarding that.  4. Constipation: Seems to be a chronic problem and manageable with laxatives.   5. Follow-up: Will be in 12 months sooner if there is a problem.    [facility], MD 5/6/20169:11 AM

## 2014-12-19 NOTE — Telephone Encounter (Signed)
Gave and printed appt sched and avs for pt for [id number]....gv pt barium

## 2015-01-22 ENCOUNTER — Ambulatory Visit (INDEPENDENT_AMBULATORY_CARE_PROVIDER_SITE_OTHER): Payer: Managed Care, Other (non HMO) | Admitting: Gastroenterology

## 2015-01-22 ENCOUNTER — Encounter: Payer: Self-pay | Admitting: Gastroenterology

## 2015-01-22 VITALS — BP 127/72 | HR 82 | Temp 97.9°F | Ht 66.0 in | Wt 215.2 lb

## 2015-01-22 DIAGNOSIS — K5900 Constipation, unspecified: Secondary | ICD-10-CM

## 2015-01-22 MED ORDER — LUBIPROSTONE 24 MCG PO CAPS: 24.0000 ug | ORAL_CAPSULE | Freq: Two times a day (BID) | ORAL | Status: DC

## 2015-01-22 NOTE — Patient Instructions (Signed)
1. Amitiza 1mcg take one with breakfast and one with evening meal. Samples provided. RX sent to your pharmacy, take the rebate card to reduce your expense.  2. Call if persistent problems.

## 2015-01-22 NOTE — Progress Notes (Signed)
Primary Care Physician: Chevis Pretty, FNP  Primary Gastroenterologist:  Barney Drain, MD   Chief Complaint  Patient presents with  . Constipation    HPI: [name] is a 49 y.o. female here for further evaluation of constipation.  She has h/o adenocarcinoma of the rectum diagnosed in 2013. Last seen at time of colonoscopy in 05/2013. Normal TI, mild diverticulosis, normal anastomosis, unable to appreciate hemorrhoids.   On linzess 114mcg since surgery. Not as well as used too. May take an additional dose later on. Started green smoothie drinks, benefiber, more salads. Have pressure and has to push, then reflux and headache.  Tried the 284mcg at one time when first started the med and it was too harsh. Currently having BM about 2-3 times per week. Has to strain. Then feels like hemorrhoids get irritated and hurt. Weight up 15 pounds. Had back surgery 9 months ago. Avoids oxycodone due to constipation. No blood in stool. No abdominal pain. No melena.    Current Outpatient Prescriptions  Medication Sig Dispense Refill  . cyclobenzaprine (FLEXERIL) 10 MG tablet   1  . estradiol (ESTRACE) 1 MG tablet Take 1 mg by mouth 2 (two) times daily.    Marland Kitchen estradiol (ESTRACE) 1 MG tablet TAKE 1 TABLET TWICE A DAY 60 tablet 5  . gabapentin (NEURONTIN) 300 MG capsule Take 300 mg by mouth 3 (three) times daily.  0  . Linaclotide (LINZESS) 145 MCG CAPS capsule Take 145 mcg by mouth every morning.    Marland Kitchen oxyCODONE-acetaminophen (PERCOCET) 7.5-325 MG per tablet Take 1 tablet by mouth every 4 (four) hours as needed for pain. 60 tablet 0   No current facility-administered medications for this visit.    Allergies as of 01/22/2015 - Review Complete 01/22/2015  Allergen Reaction Noted  . Codeine Nausea Only 12/15/2010  . Ultram [tramadol] Nausea And Vomiting 05/25/2012  . Latex Itching and Rash 07/03/2012   Past Medical History  Diagnosis Date  . Blood transfusion 1995  . Constipation    SOME BLOOD IN STOOL  . Anxiety     NEW-DUE TO ANXIETY OVER DX OF CANCER  . GERD (gastroesophageal reflux disease)     occasionally-NO MEDS  . PONV (postoperative nausea and vomiting)     used a scop patch last surgery-was better  . Cancer     RECTAL CANCER  . Kidney stone    Past Surgical History  Procedure Laterality Date  . Abdominal hysterectomy  1995    partial  . Left ankle surgery for fx  several yrs ago  . Endoscopic left fallopian tube removed  several yrs ago  . Excision/release bursa hip  09/15/2011    Dr Tonita Cong EXCISION/RELEASE BURSA HIP;  Surgeon: Johnn Hai, MD;  Location: [facility];  Service: Orthopedics;  Laterality: Left;  Excision of Trochanteric Bursitis  . Colonoscopy  06/01/2012    Procedure: COLONOSCOPY;  Surgeon: Danie Binder, MD;  Location: [facility];  Service: Endoscopy;  Laterality: N/A;  1:30PM  . Eus  06/06/2012    Procedure: LOWER ENDOSCOPIC ULTRASOUND (EUS);  Surgeon: Arta Silence, MD;  Location: [facility];  Service: Endoscopy;  Laterality: N/A;  . Laparoscopic low anterior resection  07/02/2012    Procedure: LAPAROSCOPIC LOW ANTERIOR RESECTION;  Surgeon: Stark Klein, MD;  Location: [facility];  Service: General;  Laterality: N/A;  . Colon surgery    . Examination under anesthesia  09/07/2012    Procedure: EXAM UNDER ANESTHESIA;  Surgeon: Stark Klein,  MD;  Location: [place];  Service: General;  Laterality: N/A;  . Proctoscopy  09/07/2012    Procedure: PROCTOSCOPY;  Surgeon: Stark Klein, MD;  Location: [place];  Service: General;  Laterality: N/A;  . Flexible sigmoidoscopy N/A 03/22/2013    Procedure: FLEXIBLE SIGMOIDOSCOPY;  Surgeon: Danie Binder, MD;  Location: [facility];  Service: Endoscopy;  Laterality: N/A;  2:00  . Colonoscopy N/A 06/14/2013    NKN:LZJQ diverticulosis/normal anastomosis  . Lumbar laminectomy/decompression microdiscectomy N/A 05/14/2014    Procedure: LUMBAR DECOMPRESSION L2-L3;   Surgeon: Johnn Hai, MD;  Location: [facility];  Service: Orthopedics;  Laterality: N/A;  . Back surgery     Family History  Problem Relation Age of Onset  . Other Sister   . Colon cancer Paternal Aunt    History   Social History  . Marital Status: Single    Spouse Name: N/A  . Number of Children: 2  . Years of Education: N/A   Occupational History  . primary operator [employer] History Main Topics  . Smoking status: Never Smoker   . Smokeless tobacco: Never Used  . Alcohol Use: Yes     Comment: occasional couple times per mo  . Drug Use: No  . Sexual Activity: Yes    Birth Control/ Protection: Surgical   Other Topics Concern  . None   Social History Narrative   Lives alone & 2 kids          ROS:  General: Negative for anorexia, weight loss, fever, chills, fatigue, weakness. ENT: Negative for hoarseness, difficulty swallowing , nasal congestion. CV: Negative for chest pain, angina, palpitations, dyspnea on exertion, peripheral edema.  Respiratory: Negative for dyspnea at rest, dyspnea on exertion, cough, sputum, wheezing.  GI: See history of present illness. GU:  Negative for dysuria, hematuria, urinary incontinence, urinary frequency, nocturnal urination.  Endo: Negative for unusual weight change.    Physical Examination:   BP 127/72 mmHg  Pulse 82  Temp(Src) 97.9 F (36.6 C)  Ht 5\' 6"  (1.676 m)  Wt 215 lb 3.2 oz (97.614 kg)  BMI 34.75 kg/m2  General: Well-nourished, well-developed in no acute distress.  Eyes: No icterus. Mouth: Oropharyngeal mucosa moist and pink , no lesions erythema or exudate. Lungs: Clear to auscultation bilaterally.  Heart: Regular rate and rhythm, no murmurs rubs or gallops.  Abdomen: Bowel sounds are normal, nontender, nondistended, no hepatosplenomegaly or masses, no abdominal bruits or hernia , no rebound or guarding.   Extremities: No lower extremity edema. No clubbing or deformities. Neuro: Alert and  oriented x 4   Skin: Warm and dry, no jaundice.   Psych: Alert and cooperative, normal mood and affect. RECTAL: patient refused.  Labs:  Lab Results  Component Value Date   WBC 4.7 12/12/2014   HGB 14.2 12/12/2014   HCT 42.5 12/12/2014   MCV 92.0 12/12/2014   PLT 197 12/12/2014   Lab Results  Component Value Date   CREATININE 0.7 12/12/2014   BUN 7.7 12/12/2014   NA 141 12/12/2014   K 4.1 12/12/2014   CL 101 05/19/2014   CO2 25 12/12/2014   Lab Results  Component Value Date   ALT 16 12/12/2014   AST 17 12/12/2014   ALKPHOS 61 12/12/2014   BILITOT 0.35 12/12/2014   Lab Results  Component Value Date   CEA 0.9 12/12/2014     Imaging Studies: No results found. CLINICAL DATA: Rectal carcinoma, restaging. Diagnosed 2013.  Prior surgery including partial colectomy and hysterectomy.  EXAM: CT CHEST, ABDOMEN, AND PELVIS WITH CONTRAST  TECHNIQUE: Multidetector CT imaging of the chest, abdomen and pelvis was performed following the standard protocol during bolus administration of intravenous contrast.  CONTRAST: 100 cc Isovue 300 IV contrast  COMPARISON: 11/29/2013  FINDINGS: CT CHEST FINDINGS  Mediastinum/Nodes: Great vessels are normal in caliber. Heart size normal. No pericardial effusion. No lymphadenopathy.  Lungs/Pleura: Lungs are clear. Central airways are patent. No pleural effusion.  Upper abdomen: Please see dedicated report below.  Musculoskeletal: L1 probable vertebral body hemangioma reidentified. No new lytic or sclerotic osseous lesion or acute osseous abnormality.  CT ABDOMEN AND PELVIS FINDINGS  Hepato biliary: 1 cm hypodense lesion at the dome of the right hepatic lobe image 43 is stable since prior exam 05/24/2013 with suggestion of peripheral discontiguous nodular enhancement likely indicating a stable hemangioma. No new hepatic mass or intrahepatic ductal dilatation is identified. The gallbladder is  unremarkable.  Pancreas: Normal  Spleen: Normal  Adrenals/Urinary Tract: Adrenal glands are unremarkable. Kidneys appear normal. No hydroureteronephrosis.  Stomach/Bowel: No bowel wall thickening or focal segmental dilatation is identified. The appendix is normal.  Vascular/Lymphatic: No aortic aneurysm. No lymphadenopathy.  Reproductive: Evidence of hysterectomy and left oophorectomy. Right ovary appears normal.  Other: No free air or fluid.  Musculoskeletal: No acute osseous abnormality. Lumbar spine disc degenerative change noted.  IMPRESSION: No CT evidence for intra-abdominal or pelvic metastatic disease.  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 No further specific imaging followup is needed for this likely benign finding, apart from routine surveillance restaging exams.   Electronically Signed  By: Conchita Paris M.D.  On: 12/12/2014 10:46

## 2015-01-25 NOTE — Assessment & Plan Note (Signed)
Difficult to manage constipation. Last TCS 05/2013, due in 05/2016. Trial of amitiza 60mcg BID. Call if ongoing problems.

## 2015-01-28 NOTE — Progress Notes (Signed)
cc'd to pcp 

## 2015-01-29 NOTE — Progress Notes (Addendum)
Please let patient know and NIC for next surveillance TCS in 05/2016. Per [facility].

## 2015-02-24 ENCOUNTER — Telehealth: Payer: Self-pay

## 2015-02-24 NOTE — Telephone Encounter (Signed)
Pt is calling to get a refill for her Linzess. States that the Amitiza didn't work her. States she was taking 145mg  Linzess once a day. Please advise call on cell [phone].

## 2015-02-25 MED ORDER — LINACLOTIDE 145 MCG PO CAPS: 145.0000 ug | ORAL_CAPSULE | Freq: Every day | ORAL | Status: DC

## 2015-02-25 NOTE — Telephone Encounter (Signed)
RX done. 

## 2015-02-25 NOTE — Telephone Encounter (Signed)
Pt is aware.  

## 2015-03-02 ENCOUNTER — Encounter: Payer: Self-pay | Admitting: Nurse Practitioner

## 2015-03-10 ENCOUNTER — Telehealth: Payer: Self-pay | Admitting: Gastroenterology

## 2015-03-10 NOTE — Telephone Encounter (Signed)
Please call patient regarding linzess prescription [phone]

## 2015-03-11 NOTE — Telephone Encounter (Signed)
I spoke to pt. She said she takes 2 of the Linzess 145 mcg at once daily to have a good BM.  She will run out like this and would like to have the next Rx sent in for 290 mcg to take once a day. Please let her know if that is OK.

## 2015-03-11 NOTE — Telephone Encounter (Signed)
LMOM to call.

## 2015-03-11 NOTE — Telephone Encounter (Signed)
LMOM for a return call. Told her to leave VM for me of what kind of problem she is having if she calls back and I am with a patient.

## 2015-03-11 NOTE — Telephone Encounter (Signed)
Pt returned your call and she for you to call her back

## 2015-03-15 NOTE — Progress Notes (Signed)
REVIEWED. NEXT TCS IN OCT 2019.

## 2015-03-23 ENCOUNTER — Ambulatory Visit (INDEPENDENT_AMBULATORY_CARE_PROVIDER_SITE_OTHER): Payer: Managed Care, Other (non HMO) | Admitting: Physician Assistant

## 2015-03-23 ENCOUNTER — Encounter: Payer: Self-pay | Admitting: Physician Assistant

## 2015-03-23 VITALS — BP 118/78 | HR 87 | Temp 98.3°F | Ht 66.0 in | Wt 217.0 lb

## 2015-03-23 DIAGNOSIS — M659 Synovitis and tenosynovitis, unspecified: Secondary | ICD-10-CM | POA: Diagnosis not present

## 2015-03-23 DIAGNOSIS — M778 Other enthesopathies, not elsewhere classified: Secondary | ICD-10-CM

## 2015-03-23 DIAGNOSIS — G5601 Carpal tunnel syndrome, right upper limb: Secondary | ICD-10-CM

## 2015-03-23 MED ORDER — MELOXICAM 15 MG PO TABS: 15.0000 mg | ORAL_TABLET | Freq: Every day | ORAL | Status: DC

## 2015-03-23 MED ORDER — PREDNISONE 10 MG (21) PO TBPK: ORAL_TABLET | ORAL | Status: DC

## 2015-03-23 NOTE — Progress Notes (Signed)
   Subjective:    Patient ID: [name], female    DOB: 1966-08-07, 49 y.o.   MRN: [id number]  HPI 49 y/o female presents with c/o right elbow pain x 2 weeks. No acute injury but she used a different machine at work which she feels precipitated the pain. She c/o weak and soreness. Pain started in her elbow and has now progressed to her arm and hand. She has been taking ibuprofen 800mg  q 6 hrs with some relief. She has also used ice on the AA of the elbow.     Review of Systems  Musculoskeletal: Positive for myalgias, joint swelling and arthralgias (right elbow ).  Skin: Negative for color change and wound.  All other systems reviewed and are negative.      Objective:   Physical Exam  Constitutional: She is oriented to person, place, and time. She appears well-developed and well-nourished. No distress.  Musculoskeletal: She exhibits tenderness.  Increased pain with resisted pronation and supination Positive Allen test and Phalens on right hand/wrist   Neurological: She is alert and oriented to person, place, and time. Coordination normal.  Skin: She is not diaphoretic. No erythema.  Psychiatric: She has a normal mood and affect. Her behavior is normal. Judgment and thought content normal.  Nursing note and vitals reviewed.         Assessment & Plan:  1. Carpal tunnel syndrome of right wrist  - meloxicam (MOBIC) 15 MG tablet; Take 1 tablet (15 mg total) by mouth daily.  Dispense: 30 tablet; Refill: 0 - predniSONE (STERAPRED UNI-PAK 21 TAB) 10 MG (21) TBPK tablet; 6 pills PO on day 1, 5 on day 2, 4 on day 3, 3 on day 4, 2 on day 5, 1 on day 6  Dispense: 21 tablet; Refill: 0  2. Tendonitis of elbow, right  - meloxicam (MOBIC) 15 MG tablet; Take 1 tablet (15 mg total) by mouth daily.  Dispense: 30 tablet; Refill: 0 - predniSONE (STERAPRED UNI-PAK 21 TAB) 10 MG (21) TBPK tablet; 6 pills PO on day 1, 5 on day 2, 4 on day 3, 3 on day 4, 2 on day 5, 1 on day 6  Dispense: 21 tablet;  Refill: 0  Stop taking ibuprofen. Wear wrist and elbow brace as directed. Info given on carpal tunnel and tendonitis  RTO 2 weeks. Will reassess and refer for further testing if needed   Wyatt Thorstenson A. Benjamin Stain PA-C

## 2015-03-23 NOTE — Patient Instructions (Signed)
Carpal Tunnel Syndrome The carpal tunnel is an area under the skin of the palm of your hand. Nerves, blood vessels, and strong tissues (tendons) pass through the tunnel. The tunnel can become puffy (swollen). If this happens, a nerve can be pinched in the wrist. This causes carpal tunnel syndrome.  HOME CARE  Take all medicine as told by your doctor.  If you were given a splint, wear it as told. Wear it at night or at times when your doctor told you to.  Rest your wrist from the activity that causes your pain.  Put ice on your wrist after long periods of wrist activity.  Put ice in a plastic bag.  Place a towel between your skin and the bag.  Leave the ice on for 15-20 minutes, 03-04 times a day.  Keep all doctor visits as told. GET HELP RIGHT AWAY IF:  You have new problems you cannot explain.  Your problems get worse and medicine does not help. MAKE SURE YOU:   Understand these instructions.  Will watch your condition.  Will get help right away if you are not doing well or get worse. Document Released: 07/21/2011 Document Revised: 10/24/2011 Document Reviewed: 07/21/2011 [facility] Patient Information 2015 [place], [place]. This information is not intended to replace advice given to you by your health care provider. Make sure you discuss any questions you have with your health care provider. Tendinitis  Tendinitis is redness, soreness, and puffiness (inflammation) of the tendons. Tendons are band-like tissues that connect muscle to bone. Tendinitis often happens in the shoulders, heels, or elbows. It might happen if your job involves doing the same motions over and over. HOME CARE  Use a sling or splint as told by your doctor.  Put ice on the injured area.  Put ice in a plastic bag.  Place a towel between your skin and the bag.  Leave the ice on for 15-20 minutes, 03-04 times a day.  Avoid using your injured arm or leg until the pain goes away.  Do gentle exercises  only as told by your doctor. Stop exercises if the pain gets worse, unless your doctor tells you otherwise.  Only take medicines as told by your doctor. GET HELP RIGHT AWAY IF:  Your pain and puffiness get worse.  You have new problems, such as loss of feeling (numbness) in the hands. MAKE SURE YOU:  Understand these instructions.  Will watch your condition.  Will get help right away if you are not doing well or get worse. Document Released: 11/11/2010 Document Revised: 10/24/2011 Document Reviewed: 11/11/2010 [facility] Patient Information 2015 [place], [place]. This information is not intended to replace advice given to you by your health care provider. Make sure you discuss any questions you have with your health care provider.

## 2015-04-06 ENCOUNTER — Other Ambulatory Visit: Payer: Self-pay | Admitting: Nurse Practitioner

## 2015-04-06 MED ORDER — LINACLOTIDE 290 MCG PO CAPS: 290.0000 ug | ORAL_CAPSULE | Freq: Every day | ORAL | Status: DC

## 2015-04-06 NOTE — Telephone Encounter (Signed)
Pt called and states she wants the linzess at 290mg .  States she thought this would be fixed by now.

## 2015-04-06 NOTE — Telephone Encounter (Signed)
LMOM that the Rx has been sent in.  

## 2015-04-06 NOTE — Addendum Note (Signed)
Addended by: Orvil Feil on: 04/06/2015 12:55 PM   Modules accepted: Orders

## 2015-04-06 NOTE — Telephone Encounter (Signed)
Completed. I'm not sure why there was a lag in time, but it is done.

## 2015-04-14 ENCOUNTER — Ambulatory Visit (INDEPENDENT_AMBULATORY_CARE_PROVIDER_SITE_OTHER): Payer: Managed Care, Other (non HMO) | Admitting: Nurse Practitioner

## 2015-04-14 ENCOUNTER — Encounter: Payer: Self-pay | Admitting: Nurse Practitioner

## 2015-04-14 VITALS — BP 119/80 | HR 64 | Temp 97.1°F | Ht 66.0 in | Wt 216.0 lb

## 2015-04-14 DIAGNOSIS — M7551 Bursitis of right shoulder: Secondary | ICD-10-CM

## 2015-04-14 MED ORDER — METHYLPREDNISOLONE ACETATE 40 MG/ML IJ SUSP
40.0000 mg | Freq: Once | INTRAMUSCULAR | Status: AC
  Administered 2015-04-14: 40 mg via INTRA_ARTICULAR

## 2015-04-14 MED ORDER — BUPIVACAINE HCL 0.25 % IJ SOLN
1.0000 mL | Freq: Once | INTRAMUSCULAR | Status: AC
  Administered 2015-04-14: 1 mL via INTRA_ARTICULAR

## 2015-04-14 NOTE — Progress Notes (Signed)
   Subjective:    Patient ID: [name], female    DOB: 28-Dec-1965, 49 y.o.   MRN: [id number]  HPI Patient in today with c/o right shoulder pain- started about 1 month ago- pin is a burning sensation that radiates down the front of her arm some- rates pain 6/10 constant and can increase to 10/10 with some movement- SHe was seen 3 weeks ago with same compalint and they diagnosed her with bursitis and was put on prednisone and mobi c which helped slightly.    Review of Systems  Constitutional: Negative.   HENT: Negative.   Respiratory: Negative.   Cardiovascular: Negative.   Genitourinary: Negative.   Neurological: Negative.   Psychiatric/Behavioral: Negative.   All other systems reviewed and are negative.      Objective:   Physical Exam  Constitutional: She is oriented to person, place, and time. She appears well-developed and well-nourished.  Cardiovascular: Normal rate, regular rhythm and normal heart sounds.   Pulmonary/Chest: Effort normal and breath sounds normal.  Abdominal: Soft. Bowel sounds are normal.  Musculoskeletal:  Decreased ROM due to pain on abduction and internal rotation. Grips equal bil Motor strength and sensation distally intact.  Neurological: She is alert and oriented to person, place, and time.  Skin: Skin is warm and dry.  Psychiatric: She has a normal mood and affect. Her behavior is normal. Judgment and thought content normal.    Procedure:  Betadine prep  Marcaine 0.25 58ml with depomedrol 40mg  29ml injected in right shoulder  Patient tolerated well.      Assessment & Plan:  1. Bursitis of right shoulder Ice Rest RTO prn - bupivacaine (MARCAINE) 0.25 % (with pres) injection 1 mL; Inject 1 mL into the articular space once. - methylPREDNISolone acetate (DEPO-MEDROL) injection 40 mg; Inject 1 mL (40 mg total) into the articular space once.  Mary-Margaret Hassell Done, FNP

## 2015-04-14 NOTE — Patient Instructions (Signed)
Joint Injection  Care After  Refer to this sheet in the next few days. These instructions provide you with information on caring for yourself after you have had a joint injection. Your caregiver also may give you more specific instructions. Your treatment has been planned according to current medical practices, but problems sometimes occur. Call your caregiver if you have any problems or questions after your procedure.  After any type of joint injection, it is not uncommon to experience:  · Soreness, swelling, or bruising around the injection site.  · Mild numbness, tingling, or weakness around the injection site caused by the numbing medicine used before or with the injection.  It also is possible to experience the following effects associated with the specific agent after injection:  · Iodine-based contrast agents:  ¨ Allergic reaction (itching, hives, widespread redness, and swelling beyond the injection site).  · Corticosteroids (These effects are rare.):  ¨ Allergic reaction.  ¨ Increased blood sugar levels (If you have diabetes and you notice that your blood sugar levels have increased, notify your caregiver).  ¨ Increased blood pressure levels.  ¨ Mood swings.  · Hyaluronic acid in the use of viscosupplementation.  ¨ Temporary heat or redness.  ¨ Temporary rash and itching.  ¨ Increased fluid accumulation in the injected joint.  These effects all should resolve within a day after your procedure.   HOME CARE INSTRUCTIONS  · Limit yourself to light activity the day of your procedure. Avoid lifting heavy objects, bending, stooping, or twisting.  · Take prescription or over-the-counter pain medication as directed by your caregiver.  · You may apply ice to your injection site to reduce pain and swelling the day of your procedure. Ice may be applied 03-04 times:  ¨ Put ice in a plastic bag.  ¨ Place a towel between your skin and the bag.  ¨ Leave the ice on for no longer than 15-20 minutes each time.  SEEK  IMMEDIATE MEDICAL CARE IF:   · Pain and swelling get worse rather than better or extend beyond the injection site.  · Numbness does not go away.  · Blood or fluid continues to leak from the injection site.  · You have chest pain.  · You have swelling of your face or tongue.  · You have trouble breathing or you become dizzy.  · You develop a fever, chills, or severe tenderness at the injection site that last longer than 1 day.  MAKE SURE YOU:  · Understand these instructions.  · Watch your condition.  · Get help right away if you are not doing well or if you get worse.  Document Released: 04/14/2011 Document Revised: 10/24/2011 Document Reviewed: 04/14/2011  [employer]® Patient Information ©2015 [employer]. This information is not intended to replace advice given to you by your health care provider. Make sure you discuss any questions you have with your health care provider.

## 2015-05-14 ENCOUNTER — Telehealth: Payer: Self-pay | Admitting: Nurse Practitioner

## 2015-05-14 ENCOUNTER — Other Ambulatory Visit: Payer: Self-pay | Admitting: *Deleted

## 2015-05-14 MED ORDER — VALACYCLOVIR HCL 1 G PO TABS: 1000.0000 mg | ORAL_TABLET | Freq: Three times a day (TID) | ORAL | Status: DC

## 2015-05-20 ENCOUNTER — Ambulatory Visit (INDEPENDENT_AMBULATORY_CARE_PROVIDER_SITE_OTHER): Payer: Managed Care, Other (non HMO) | Admitting: Pediatrics

## 2015-05-20 VITALS — BP 120/81 | HR 71 | Temp 97.4°F | Ht 66.0 in | Wt 214.0 lb

## 2015-05-20 DIAGNOSIS — L02215 Cutaneous abscess of perineum: Secondary | ICD-10-CM

## 2015-05-20 DIAGNOSIS — L03317 Cellulitis of buttock: Secondary | ICD-10-CM | POA: Diagnosis not present

## 2015-05-20 MED ORDER — CLINDAMYCIN HCL 300 MG PO CAPS: 300.0000 mg | ORAL_CAPSULE | Freq: Three times a day (TID) | ORAL | Status: DC

## 2015-05-20 MED ORDER — MUPIROCIN 2 % EX OINT
TOPICAL_OINTMENT | CUTANEOUS | Status: DC
Start: 2015-05-20 — End: 2015-07-23

## 2015-05-20 NOTE — Patient Instructions (Signed)
Warm compresses 2-3 times a day Mupirocin ointment twice a day Continue antibioitic 3x a day for 7 days.

## 2015-05-20 NOTE — Progress Notes (Signed)
Subjective:    Patient ID: [name], female    DOB: 1966/02/21, 49 y.o.   MRN: [id number]  CC: boil  HPI: Concetta Guion is a 49 y.o. female presenting on 05/20/2015 for No chief complaint on file.  R side of genital area has a sore spot, at first felt like a boil, a little bit of bleeding from it, no other discharge.  Was dx with herpes 20 yrs ago, says she has not had any outbreaks in recent years. No vaginal discharge.  Urine seems darker than usual. No new sexual partners. No fevers. Has had boils in past in genital area.   Relevant past medical, surgical, family and social history reviewed and updated as indicated. Interim medical history since our last visit reviewed. Allergies and medications reviewed and updated.   ROS: Per HPI unless specifically indicated above  Past Medical History Patient Active Problem List   Diagnosis Date Noted  . HNP (herniated nucleus pulposus), lumbar 05/14/2014  . Internal and external prolapsed hemorrhoids 03/15/2013  . Anxiety state, unspecified 03/15/2013  . Dysuria 07/23/2012  . Rectal cancer, cT2N0, 7 cm from anal verge 06/11/2012  . Hematochezia 05/25/2012  . Constipation 05/25/2012    Current Outpatient Prescriptions  Medication Sig Dispense Refill  . estradiol (ESTRACE) 1 MG tablet TAKE 1 TABLET TWICE A DAY 60 tablet 5  . gabapentin (NEURONTIN) 300 MG capsule Take 300 mg by mouth 3 (three) times daily.  0  . Linaclotide (LINZESS) 290 MCG CAPS capsule Take 1 capsule (290 mcg total) by mouth daily. 30 capsule 11  . oxyCODONE-acetaminophen (PERCOCET) 7.5-325 MG per tablet Take 1 tablet by mouth every 4 (four) hours as needed for pain. 60 tablet 0  . valACYclovir (VALTREX) 1000 MG tablet Take 1 tablet (1,000 mg total) by mouth 3 (three) times daily. 21 tablet 0  . clindamycin (CLEOCIN) 300 MG capsule Take 1 capsule (300 mg total) by mouth 3 (three) times daily. 21 capsule 0  . mupirocin ointment (BACTROBAN) 2 % Use twice a day  to affected area 22 g 0   No current facility-administered medications for this visit.       Objective:    BP 120/81 mmHg  Pulse 71  Temp(Src) 97.4 F (36.3 C) (Oral)  Ht 5\' 6"  (1.676 m)  Wt 214 lb (97.07 kg)  BMI 34.56 kg/m2  Wt Readings from Last 3 Encounters:  05/20/15 214 lb (97.07 kg)  04/14/15 216 lb (97.977 kg)  03/23/15 217 lb (98.431 kg)    Gen: NAD, alert, cooperative with exam, NCAT EYES: EOMI, no scleral injection or icterus CV: NRRR, normal S1/S2, no murmur, DP pulses 2+ b/l Resp: CTABL, no wheezes, normal WOB Abd: +BS, soft, NTND.  GU: Normal external female genitalia. Lateral and inferior to R labia, small 1cm nodule with some fluctuance, some serosanguinous drainage present, tender, small amount of surrounding erythema. Neuro: Alert and oriented     Assessment & Plan:   49yoF with perineal abscess, some surrounding cellulitis. Not in bartholin's gland.  Perineal abscess, superficial I and Dperformed, see separate procedure note. Continue to use warm compresses.  Cellulitis of buttock -     clindamycin (CLEOCIN) 300 MG capsule; Take 1 capsule (300 mg total) by mouth 3 (three) times daily. -     mupirocin ointment (BACTROBAN) 2 %; Use twice a day to affected area  PROCEDURE NOTE: R sided perineum with 1 cm red nodule consistent with abscess identified, area cleaned with betadine. 81mL of 1%  lidocaine was injected under surface of skin. After lidocaine had taken effect, #11 scalpel used to make a 50mm incision on surface of nodule with return of small amount of bloody purulent material. Area too small to pack. Patient tolerated procedure well. Care instructions given.   Follow up plan: Return if symptoms worsen or fail to improve.  Assunta Found, MD [facility] 05/20/2015, 4:51 PM

## 2015-05-23 ENCOUNTER — Encounter: Payer: Self-pay | Admitting: Pediatrics

## 2015-05-26 IMAGING — CR DG CHEST 2V
2 series · 2 of 2 positions shown · non-contrast
Comparison: CT 11/29/2013.  Radiographs 06/01/2012.

CLINICAL DATA: Recent back surgery. Sensation of feeling hot and
cold with shortness of breath. Initial encounter.

EXAM:
CHEST  2 VIEW

[view not recorded (1 of 2)]
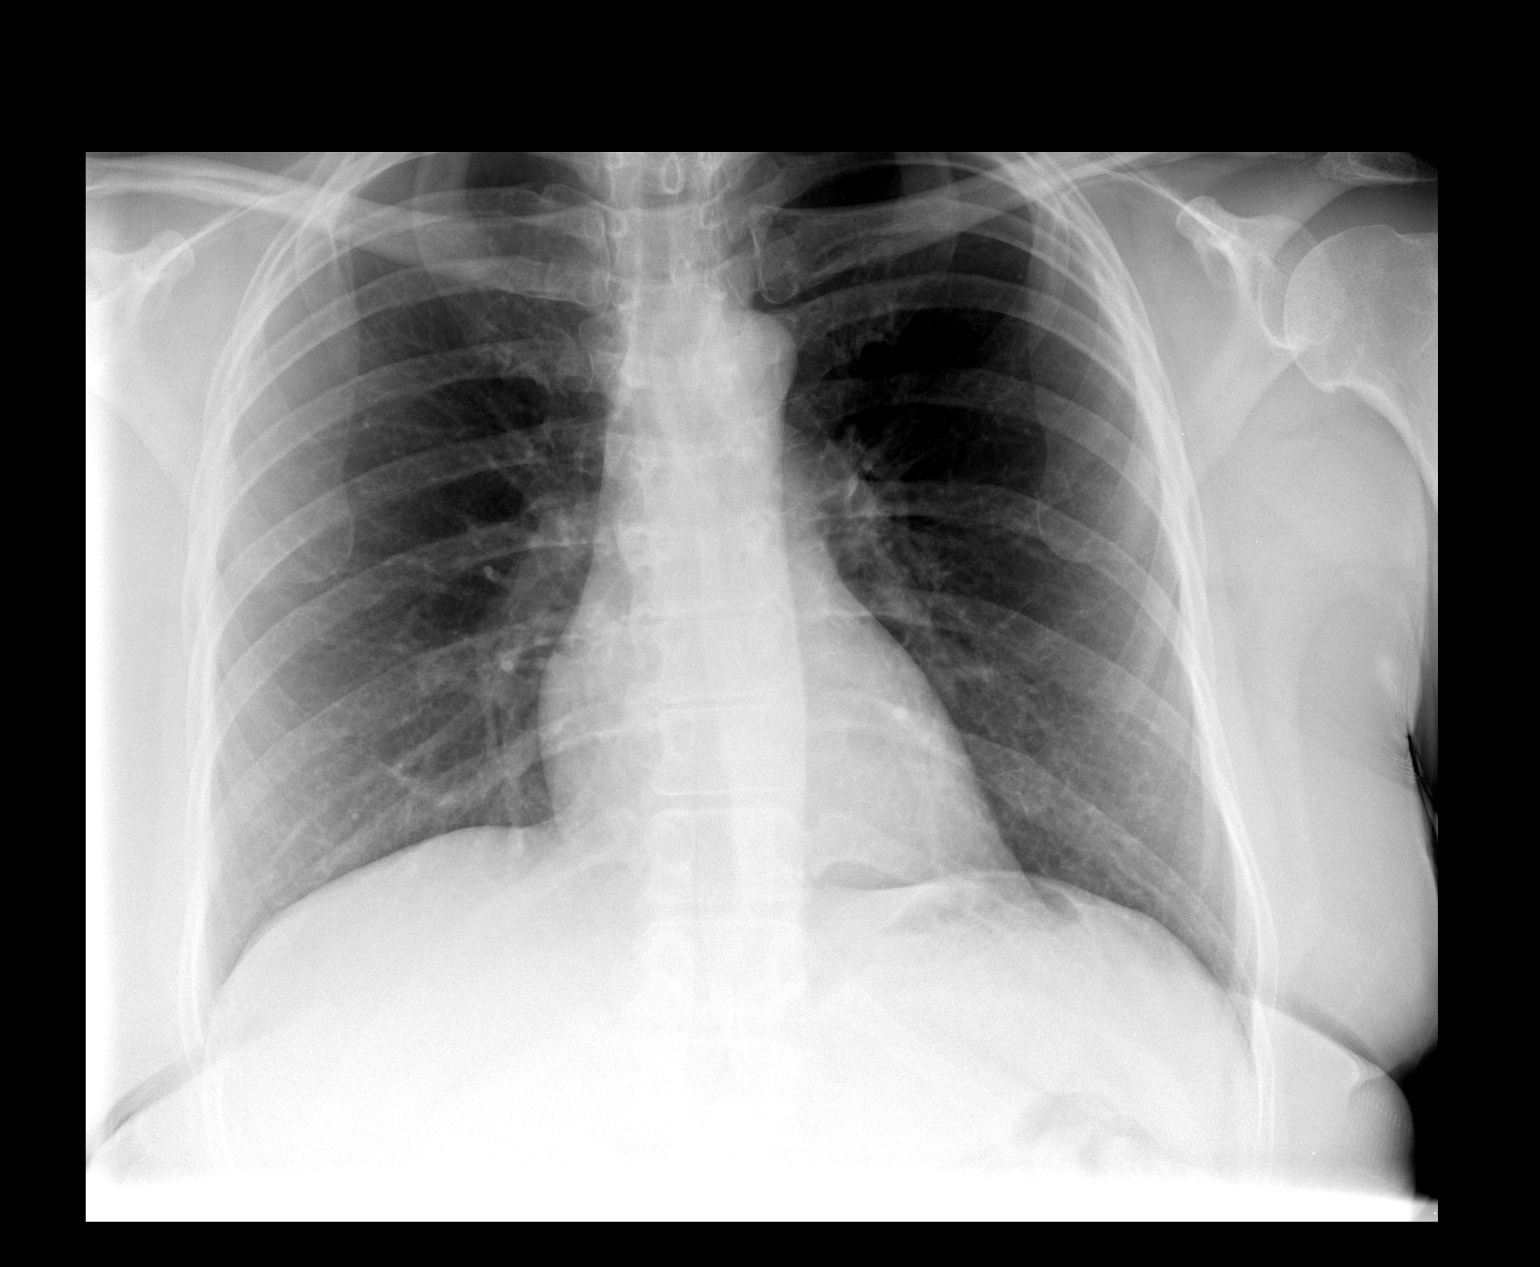

[view not recorded (2 of 2)]
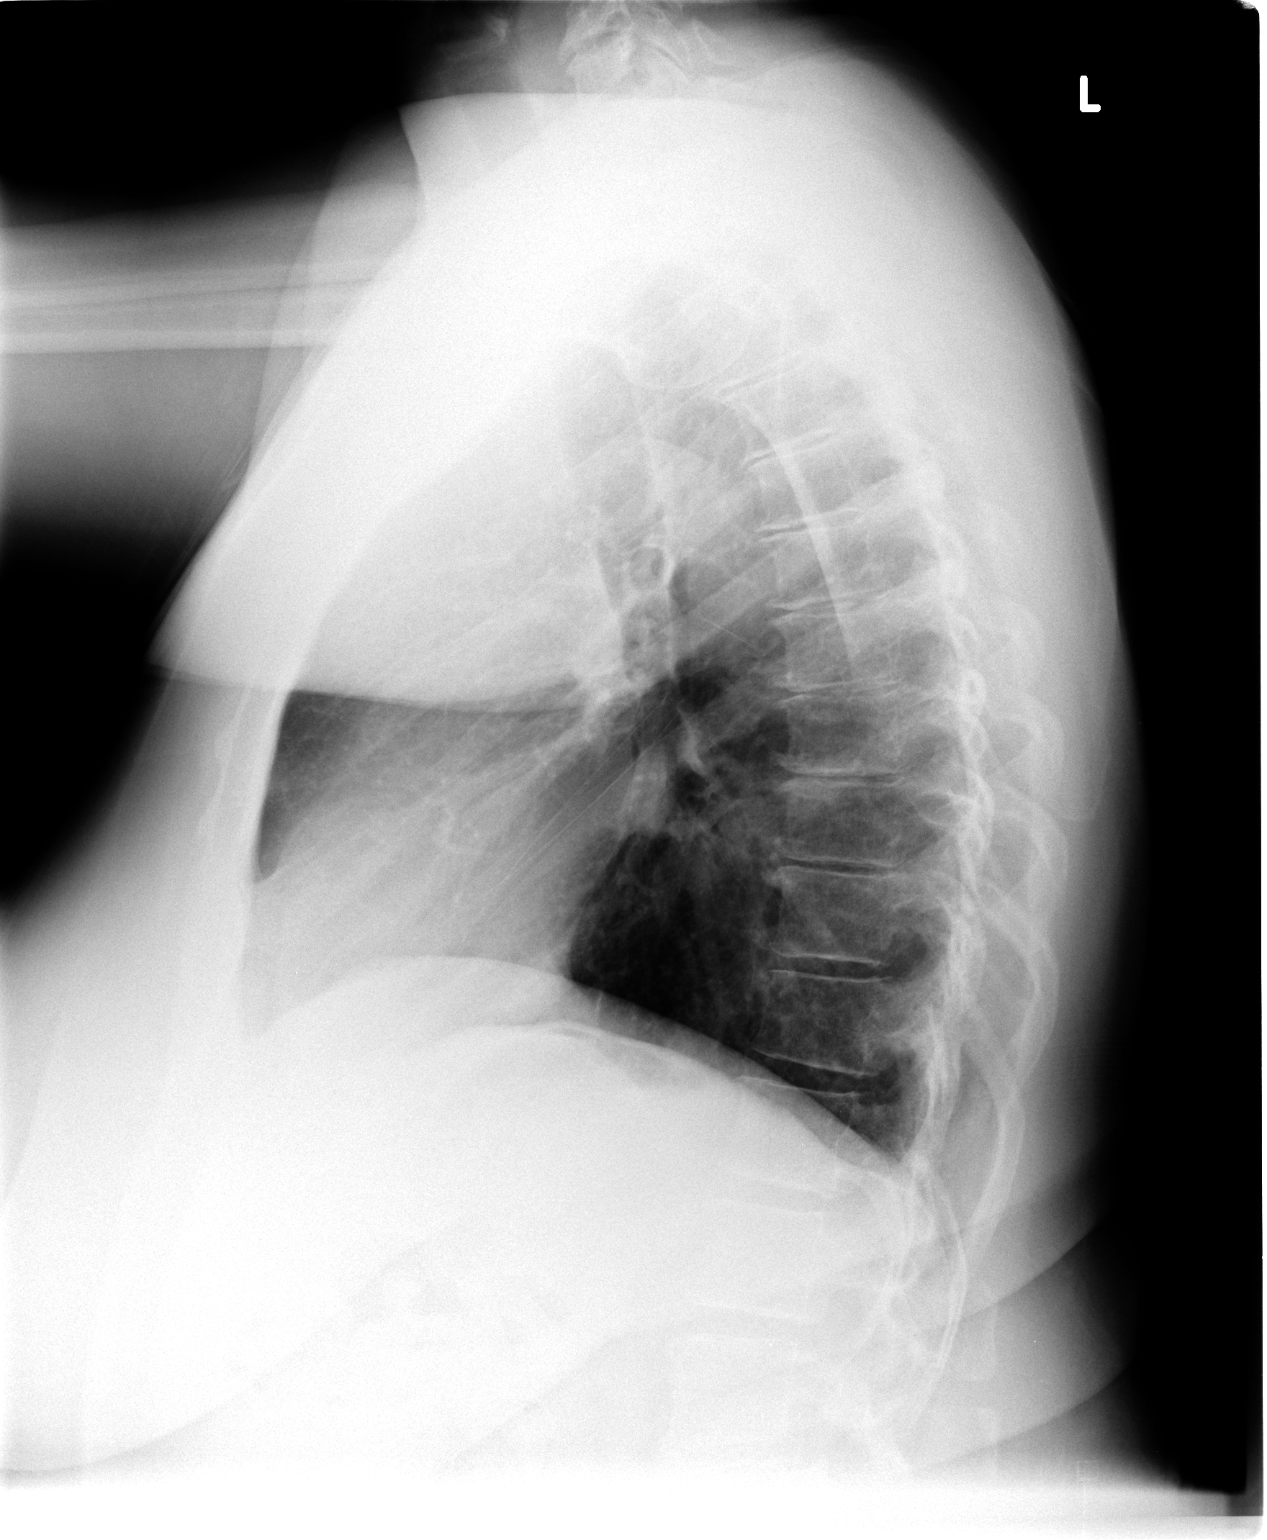

[2 of 2 positions shown; findings below may reference images not displayed]

FINDINGS: The heart size and mediastinal contours are normal. The lungs are
clear. There is no pleural effusion or pneumothorax. No acute
osseous findings are identified. There are stable degenerative
changes within the thoracic spine.
IMPRESSION: Stable chest.  No active cardiopulmonary process.

## 2015-06-02 ENCOUNTER — Encounter: Payer: Self-pay | Admitting: *Deleted

## 2015-06-02 ENCOUNTER — Telehealth: Payer: Self-pay | Admitting: *Deleted

## 2015-06-02 NOTE — Telephone Encounter (Signed)
Spoke with patient, per linda davis in managed care, patient's insurance, [employer] will not pay for another scan this year. Patient verbalized understanding.

## 2015-06-09 ENCOUNTER — Telehealth: Payer: Self-pay | Admitting: Nurse Practitioner

## 2015-06-09 NOTE — Telephone Encounter (Signed)
Notes have been faxed

## 2015-06-25 ENCOUNTER — Encounter: Payer: Self-pay | Admitting: Orthopedic Surgery

## 2015-06-25 ENCOUNTER — Ambulatory Visit (INDEPENDENT_AMBULATORY_CARE_PROVIDER_SITE_OTHER): Payer: Managed Care, Other (non HMO) | Admitting: Orthopedic Surgery

## 2015-06-25 ENCOUNTER — Ambulatory Visit (INDEPENDENT_AMBULATORY_CARE_PROVIDER_SITE_OTHER): Payer: Managed Care, Other (non HMO)

## 2015-06-25 VITALS — BP 128/84 | Ht 66.0 in | Wt 214.0 lb

## 2015-06-25 DIAGNOSIS — M25511 Pain in right shoulder: Secondary | ICD-10-CM | POA: Diagnosis not present

## 2015-06-25 DIAGNOSIS — M7531 Calcific tendinitis of right shoulder: Secondary | ICD-10-CM | POA: Diagnosis not present

## 2015-06-25 DIAGNOSIS — M7711 Lateral epicondylitis, right elbow: Secondary | ICD-10-CM

## 2015-06-25 DIAGNOSIS — M75101 Unspecified rotator cuff tear or rupture of right shoulder, not specified as traumatic: Secondary | ICD-10-CM | POA: Diagnosis not present

## 2015-06-25 DIAGNOSIS — M7541 Impingement syndrome of right shoulder: Secondary | ICD-10-CM

## 2015-06-25 MED ORDER — HYDROCODONE-ACETAMINOPHEN 5-325 MG PO TABS: 1.0000 | ORAL_TABLET | Freq: Four times a day (QID) | ORAL | Status: DC | PRN

## 2015-06-25 NOTE — Progress Notes (Signed)
[name] is a 49 y.o. female  Shoulder Pain: 49 year old female presents with right shoulder pain which actually started in her right elbow and wants that, subsided the pain localized in the right shoulder. She has pain with forward elevation of the shoulder she cannot sleep on her right side as the pain increases. She has decreased range of motion especially reaching above her head. She did receive one injection about a month or 2 ago and it helped for a couple of days. She presents now for evaluation and treatment of ongoing shoulder and elbow problems  There was no trauma. She described a hot knifelike sensation from posterior to anterior with pain over the lateral upper deltoid as well  Neurologic symptoms none Musculoskeletal elbow pain on the right lateral side and no neck pain   Past Medical History  Diagnosis Date  . Blood transfusion 1995  . Constipation     SOME BLOOD IN STOOL  . Anxiety     NEW-DUE TO ANXIETY OVER DX OF CANCER  . GERD (gastroesophageal reflux disease)     occasionally-NO MEDS  . PONV (postoperative nausea and vomiting)     used a scop patch last surgery-was better  . Cancer (HCC)     RECTAL CANCER  . Kidney stone    Past Surgical History  Procedure Laterality Date  . Abdominal hysterectomy  1995    partial  . Left ankle surgery for fx  several yrs ago  . Endoscopic left fallopian tube removed  several yrs ago  . Excision/release bursa hip  09/15/2011    Dr Tonita Cong EXCISION/RELEASE BURSA HIP;  Surgeon: Johnn Hai, MD;  Location: [facility];  Service: Orthopedics;  Laterality: Left;  Excision of Trochanteric Bursitis  . Colonoscopy  06/01/2012    Procedure: COLONOSCOPY;  Surgeon: Danie Binder, MD;  Location: [facility];  Service: Endoscopy;  Laterality: N/A;  1:30PM  . Eus  06/06/2012    Procedure: LOWER ENDOSCOPIC ULTRASOUND (EUS);  Surgeon: Arta Silence, MD;  Location: [facility];  Service: Endoscopy;  Laterality: N/A;  . Laparoscopic low  anterior resection  07/02/2012    Procedure: LAPAROSCOPIC LOW ANTERIOR RESECTION;  Surgeon: Stark Klein, MD;  Location: [facility];  Service: General;  Laterality: N/A;  . Colon surgery    . Examination under anesthesia  09/07/2012    Procedure: EXAM UNDER ANESTHESIA;  Surgeon: Stark Klein, MD;  Location: [place];  Service: General;  Laterality: N/A;  . Proctoscopy  09/07/2012    Procedure: PROCTOSCOPY;  Surgeon: Stark Klein, MD;  Location: [place];  Service: General;  Laterality: N/A;  . Flexible sigmoidoscopy N/A 03/22/2013    Procedure: FLEXIBLE SIGMOIDOSCOPY;  Surgeon: Danie Binder, MD;  Location: [facility];  Service: Endoscopy;  Laterality: N/A;  2:00  . Colonoscopy N/A 06/14/2013    [id number] diverticulosis/normal anastomosis  . Lumbar laminectomy/decompression microdiscectomy N/A 05/14/2014    Procedure: LUMBAR DECOMPRESSION L2-L3;  Surgeon: Johnn Hai, MD;  Location: [facility];  Service: Orthopedics;  Laterality: N/A;  . Back surgery  2015    Current outpatient prescriptions:  .  estradiol (ESTRACE) 1 MG tablet, TAKE 1 TABLET TWICE A DAY, Disp: 60 tablet, Rfl: 5 .  gabapentin (NEURONTIN) 300 MG capsule, Take 300 mg by mouth 3 (three) times daily., Disp: , Rfl: 0 .  Linaclotide (LINZESS) 290 MCG CAPS capsule, Take 1 capsule (290 mcg total) by mouth daily., Disp: 30 capsule, Rfl: 11 .  oxyCODONE-acetaminophen (PERCOCET)  7.5-325 MG per tablet, Take 1 tablet by mouth every 4 (four) hours as needed for pain., Disp: 60 tablet, Rfl: 0 .  clindamycin (CLEOCIN) 300 MG capsule, Take 1 capsule (300 mg total) by mouth 3 (three) times daily. (Patient not taking: Reported on 06/25/2015), Disp: 21 capsule, Rfl: 0 .  HYDROcodone-acetaminophen (NORCO/VICODIN) 5-325 MG tablet, Take 1 tablet by mouth every 6 (six) hours as needed for moderate pain., Disp: 60 tablet, Rfl: 0 .  mupirocin ointment (BACTROBAN) 2 %, Use twice a day to affected area (Patient not taking:  Reported on 06/25/2015), Disp: 22 g, Rfl: 0 Allergies  Allergen Reactions  . Codeine Nausea Only    Confirmed intolerance of codeine verbally with pt in PACU, pt states does not cause any SOB or dyspnea, some nausea only (MN-RN)  . Ultram [Tramadol] Nausea And Vomiting    Ultram allergy also discussed, active N&V occurs with Ultram  . Latex Itching and Rash    reports that she has never smoked. She has never used smokeless tobacco. She reports that she drinks alcohol. She reports that she does not use illicit drugs. Family History  Problem Relation Age of Onset  . Other Sister   . Colon cancer Paternal Aunt     Left Shoulder: Inspection reveals no abnormalities, atrophy or asymmetry. Palpation is normal with no tenderness over AC joint or bicipital groove. ROM is full in all planes. Rotator cuff strength normal throughout. No signs of impingement with negative Neer and Hawkin's tests, empty can. Speeds and Yergason's tests normal. No labral pathology noted with negative Obrien's, negative clunk and good stability. Normal scapular function observed. No painful arc and no drop arm sign. No apprehension sign  Right shoulder inspection reveals tenderness in the lateral deltoid area as well as the posterior inferior soft spot of the acromion. No swelling atrophy or asymmetry is noted. Before meals joint bicipital groove nontender.  Range of motion is abnormal in flexio passive 120 active 100 and painful.  We actually do find signs of impingement by Neer maneuver No labral pathology noted with negative O'Brien's note clunk normal stability nontender scapula with normal scapular function Drop sign negative Apprehension negative  Neurovascular exam is intact axillary lymph nodes are negative  Right elbow lateral epicondylar tenderness Painful wrist extension against resistance with elbow in extension Full range of motion and normal stability of the elbow  Imaging and data I  personally interpreted the plain films: Plain films were ordered [facility] lateral right shoulder There is possible calcification at the rotator cuff insertion otherwise glenohumeral joint shows a large inferior spur on the glenoid side but joint space normal. Acromion looks to be curved with sharp anterior edge without spur  Diagnosis calcific tendinitis of the right shoulder with bursitis and impingement Lateral epicondylitis right elbow  Continue ibuprofen 800 mg every 6 add Norco 5 mg daily at bedtime and every 6 hours dosing if needed Subacromial injection Lateral epicondyle injection  4 weeks fu   Encounter Diagnoses  Name Primary?  . Right shoulder pain Yes  . 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 Rotator cuff impingement syndrome, right [M75.101]   . Right tennis elbow

## 2015-06-29 ENCOUNTER — Telehealth: Payer: Self-pay | Admitting: *Deleted

## 2015-06-29 NOTE — Telephone Encounter (Signed)
Patient called and left message twice Friday after we closed stating she is having severe pain in her right shoulder, patient stated she got a injection at her last visit and she wants to know if that would be causing the pain, patient also stated she has a couple questions about the injection. Please advise [phone]

## 2015-06-29 NOTE — Telephone Encounter (Signed)
Remember  Injection: some patients have post injection flare up  If so this is what the nurse should tell them:  1. Take tylenol and use ice as in post injection instructions, it usually lasts 48 hours   2. If that doesn't work they need an oral opiod like norco

## 2015-06-29 NOTE — Telephone Encounter (Signed)
Routing to Dr Harrison for review 

## 2015-06-29 NOTE — Telephone Encounter (Signed)
Called patient, no answer, left vm 

## 2015-07-01 NOTE — Telephone Encounter (Signed)
Patient returned call and states pain has much improved

## 2015-07-01 NOTE — Telephone Encounter (Signed)
Called patient, no answer, left vm 

## 2015-07-23 ENCOUNTER — Ambulatory Visit (INDEPENDENT_AMBULATORY_CARE_PROVIDER_SITE_OTHER): Payer: Managed Care, Other (non HMO) | Admitting: Orthopedic Surgery

## 2015-07-23 VITALS — BP 104/67 | Ht 66.0 in | Wt 214.0 lb

## 2015-07-23 DIAGNOSIS — M75101 Unspecified rotator cuff tear or rupture of right shoulder, not specified as traumatic: Secondary | ICD-10-CM

## 2015-07-23 DIAGNOSIS — M7711 Lateral epicondylitis, right elbow: Secondary | ICD-10-CM | POA: Diagnosis not present

## 2015-07-23 DIAGNOSIS — M7541 Impingement syndrome of right shoulder: Secondary | ICD-10-CM

## 2015-07-23 NOTE — Progress Notes (Signed)
Follow-up visit  History 06/25/2015 Shoulder Pain: 49 year old female presents with right shoulder pain which actually started in her right elbow and wants that, subsided the pain localized in the right shoulder. She has pain with forward elevation of the shoulder she cannot sleep on her right side as the pain increases. She has decreased range of motion especially reaching above her head. She did receive one injection about a month or 2 ago and it helped for a couple of days. She presents now for evaluation and treatment of ongoing shoulder and elbow problems  There was no trauma. She described a hot knifelike sensation from posterior to anterior with pain over the lateral upper deltoid as well  Neurologic symptoms none Musculoskeletal elbow pain on the right lateral side and no neck pain  Today:  Chief Complaint  Patient presents with  . Follow-up    4 week follow up Rt shoulder + Rt elbow s/p injections   The patient says her right shoulder has much improved but she still having pain in the right elbow  We gave her subacromial injection in the right shoulder and did well with that  She got an elbow injection, tennis elbow brace, ice, therapy at home and she still working  System review no numbness no tingling no weakness  Exam shows forward elevation of the shoulder 150 impingement sign negative no weakness in the cuff  Right elbow tenderness persists painful wrist extension  Repeat injection right elbow  Procedure note injection for right tennis elbow   Diagnosis right tennis elbow  Anesthesia ethyl chloride was used Alcohol use is clean the skin  After we obtained verbal consent and timeout a 25-gauge needle was used to inject 40 mg of Depo-Medrol and 3 cc of 1% lidocaine just distal to the insertion of the ECRB  There were no complications and a sterile bandage was applied.

## 2015-08-05 ENCOUNTER — Telehealth: Payer: Self-pay | Admitting: Orthopedic Surgery

## 2015-08-05 NOTE — Telephone Encounter (Signed)
Patient called to ask about scheduling appointment for injection, recurring pain right shoulder; I spoke with our nurse, who reviewed, and advises that protocol for joint injections is 90 days, and patient last had this done on 06/25/15; therefore it is too soon.  Patient voiced understanding, and may contact her primary care provider to be advised.

## 2015-08-11 ENCOUNTER — Other Ambulatory Visit: Payer: Self-pay | Admitting: Nurse Practitioner

## 2015-09-03 ENCOUNTER — Ambulatory Visit (INDEPENDENT_AMBULATORY_CARE_PROVIDER_SITE_OTHER): Payer: 59 | Admitting: Orthopedic Surgery

## 2015-09-03 VITALS — Ht 66.0 in | Wt 214.0 lb

## 2015-09-03 DIAGNOSIS — M75101 Unspecified rotator cuff tear or rupture of right shoulder, not specified as traumatic: Secondary | ICD-10-CM

## 2015-09-03 DIAGNOSIS — M7711 Lateral epicondylitis, right elbow: Secondary | ICD-10-CM

## 2015-09-03 DIAGNOSIS — M7541 Impingement syndrome of right shoulder: Secondary | ICD-10-CM

## 2015-09-03 MED ORDER — PREDNISONE 10 MG PO TABS: 10.0000 mg | ORAL_TABLET | Freq: Three times a day (TID) | ORAL | Status: DC

## 2015-09-03 NOTE — Patient Instructions (Signed)
Ice 20 minutes every 4 hours.   Take 10mg  prednisone.

## 2015-09-03 NOTE — Progress Notes (Signed)
Patient ID: [name], female   DOB: [birth year], 50 y.o.   MRN: [id number]  Chief Complaint  Patient presents with  . Follow-up    6 week follow up on     Ht 5\' 6"  (1.676 m)  Wt 214 lb (97.07 kg)  BMI 34.56 kg/m2  [name] has 2 problems #1 she has right rotator cuff syndrome #2 tennis elbow right elbow  Status post injections in both currently her right shoulder is bothering her. Thursdays are quite bad for her because of the activities at work requiring overhead activity.  Right shoulder pain severe constant dull ache associated with overhead activity no recent trauma appears to be work-related  Elbow is stable at this time  ROS: She denies numbness or tingling but has pain with overhead activity and she has night pain   Past Medical History  Diagnosis Date  . Blood transfusion 1995  . Constipation     SOME BLOOD IN STOOL  . Anxiety     NEW-DUE TO ANXIETY OVER DX OF CANCER  . GERD (gastroesophageal reflux disease)     occasionally-NO MEDS  . PONV (postoperative nausea and vomiting)     used a scop patch last surgery-was better  . Cancer (HCC)     RECTAL CANCER  . Kidney stone     Normal appearance oriented 3  mood pleasant  skin intact  sensation normal  good pulse right arm  painful forward elevation of the shoulder  positive impingement sign  tenderness in the rotator interval  but good strength Decreased range of motion flexion internal rotation 130 and sacral area lumbar spine 5 area respectively No instability Lymph nodes are normal      ASSESSMENT AND PLAN   Tennis elbow stable  Rotator cuff syndrome worse  Inject right shoulder. She had steroid flare so put her on prednisone 10 mg and ice tomorrow she doesn't have to work so she can rest we injected the subacromial space follow-up as needed   Procedure note the subacromial injection shoulder RIGHT  Verbal consent was obtained to inject the  RIGHT   Shoulder  Timeout was completed to confirm  the injection site is a subacromial space of the  RIGHT  shoulder   Medication used Depo-Medrol 40 mg and lidocaine 1% 3 cc  Anesthesia was provided by ethyl chloride  The injection was performed in the RIGHT  posterior subacromial space. After pinning the skin with alcohol and anesthetized the skin with ethyl chloride the subacromial space was injected using a 20-gauge needle. There were no complications  Sterile dressing was applied.

## 2015-12-01 ENCOUNTER — Encounter: Payer: Self-pay | Admitting: Nurse Practitioner

## 2015-12-01 ENCOUNTER — Ambulatory Visit (INDEPENDENT_AMBULATORY_CARE_PROVIDER_SITE_OTHER): Payer: 59 | Admitting: Nurse Practitioner

## 2015-12-01 ENCOUNTER — Telehealth: Payer: Self-pay | Admitting: Nurse Practitioner

## 2015-12-01 ENCOUNTER — Encounter (INDEPENDENT_AMBULATORY_CARE_PROVIDER_SITE_OTHER): Payer: Self-pay

## 2015-12-01 VITALS — BP 123/86 | HR 86 | Temp 97.2°F | Ht 66.0 in | Wt 213.0 lb

## 2015-12-01 DIAGNOSIS — N760 Acute vaginitis: Secondary | ICD-10-CM | POA: Diagnosis not present

## 2015-12-01 DIAGNOSIS — L989 Disorder of the skin and subcutaneous tissue, unspecified: Secondary | ICD-10-CM | POA: Diagnosis not present

## 2015-12-01 DIAGNOSIS — A499 Bacterial infection, unspecified: Secondary | ICD-10-CM

## 2015-12-01 DIAGNOSIS — B9689 Other specified bacterial agents as the cause of diseases classified elsewhere: Secondary | ICD-10-CM

## 2015-12-01 LAB — WET PREP FOR TRICH, YEAST, CLUE
Clue Cell Exam: POSITIVE — AB
TRICHOMONAS EXAM: NEGATIVE
Yeast Exam: NEGATIVE

## 2015-12-01 MED ORDER — METRONIDAZOLE 500 MG PO TABS: 500.0000 mg | ORAL_TABLET | Freq: Two times a day (BID) | ORAL | Status: DC

## 2015-12-01 MED ORDER — FLUCONAZOLE 150 MG PO TABS
ORAL_TABLET | ORAL | Status: DC
Start: 2015-12-01 — End: 2016-01-01

## 2015-12-01 NOTE — Patient Instructions (Signed)

## 2015-12-01 NOTE — Telephone Encounter (Signed)
Pt given appt with MMM at 3:45 today.

## 2015-12-01 NOTE — Progress Notes (Signed)
   Subjective:    Patient ID: [name], female    DOB: March 20, 1966, 50 y.o.   MRN: [id number]  HPI  Patient comes in today c/o rash in vaginal area- itches with slight burn-has been using vagaisil with no relief- not sure if has discharge or not because she has been using so much cream. Started Friday and gets worse everyday. Denies any changes in soaps or detergents, toilet paper, etc. Also denies new sex partner.  Review of Systems  Constitutional: Negative.   HENT: Negative.   Respiratory: Negative.   Cardiovascular: Negative.   Genitourinary: Negative.   Neurological: Negative.   Psychiatric/Behavioral: Negative.   All other systems reviewed and are negative.      Objective:   Physical Exam  Constitutional: She is oriented to person, place, and time. She appears well-developed and well-nourished.  Cardiovascular: Normal rate, regular rhythm and normal heart sounds.   Pulmonary/Chest: Effort normal and breath sounds normal.  Genitourinary:  Slight vaginal discharge Erythematous raw appearing perineal area   Neurological: She is alert and oriented to person, place, and time.  Skin: Skin is warm. Rash noted.  Psychiatric: She has a normal mood and affect. Her behavior is normal. Judgment and thought content normal.   BP 123/86 mmHg  Pulse 86  Temp(Src) 97.2 F (36.2 C) (Oral)  Ht 5\' 6"  (1.676 m)  Wt 213 lb (96.616 kg)  BMI 34.40 kg/m2  Wet prep- clue cells       Assessment & Plan:   1. Perineal irritation in female   2. Bacterial vaginosis    Meds ordered this encounter  Medications  . metroNIDAZOLE (FLAGYL) 500 MG tablet    Sig: Take 1 tablet (500 mg total) by mouth 2 (two) times daily.    Dispense:  14 tablet    Refill:  0    Order Specific Question:  Supervising Provider    Answer:  Chipper Herb [[id number]]  . fluconazole (DIFLUCAN) 150 MG tablet    Sig: 1 po q week x 4 weeks    Dispense:  4 tablet    Refill:  0    Order Specific Question:  Supervising  Provider    Answer:  Chipper Herb [[id number]]   Going to treat for yeast as well just to make sure rash resolves Avoid harsh soaps on perineunm Avoid scratching moinostat cream OTC may help with itching RTO prn  Mary-Margaret Hassell Done, FNP

## 2015-12-02 ENCOUNTER — Telehealth: Payer: Self-pay | Admitting: Nurse Practitioner

## 2015-12-02 MED ORDER — NYSTATIN 100000 UNIT/GM EX CREA: 1.0000 "application " | TOPICAL_CREAM | Freq: Two times a day (BID) | CUTANEOUS | Status: DC

## 2015-12-02 NOTE — Telephone Encounter (Signed)
Patient aware that Nystatin cream has been sent to her pharmacy

## 2015-12-02 NOTE — Telephone Encounter (Signed)
Okay to give nystatin cream

## 2015-12-18 ENCOUNTER — Ambulatory Visit (HOSPITAL_COMMUNITY)
Admission: RE | Admit: 2015-12-18 | Discharge: 2015-12-18 | Disposition: A | Discharge status: Home / Self Care | Payer: 59 | Source: Ambulatory Visit | Attending: Oncology | Admitting: Oncology

## 2015-12-18 ENCOUNTER — Other Ambulatory Visit (HOSPITAL_BASED_OUTPATIENT_CLINIC_OR_DEPARTMENT_OTHER): Payer: 59

## 2015-12-18 ENCOUNTER — Encounter (HOSPITAL_COMMUNITY): Payer: Self-pay

## 2015-12-18 DIAGNOSIS — C20 Malignant neoplasm of rectum: Secondary | ICD-10-CM

## 2015-12-18 LAB — COMPREHENSIVE METABOLIC PANEL
ALBUMIN: 4 g/dL (ref 3.5–5.0)
ALT: 21 U/L (ref 0–55)
AST: 22 U/L (ref 5–34)
Alkaline Phosphatase: 52 U/L (ref 40–150)
Anion Gap: 9 mEq/L (ref 3–11)
BUN: 5.7 mg/dL — AB (ref 7.0–26.0)
CHLORIDE: 106 meq/L (ref 98–109)
CO2: 25 mEq/L (ref 22–29)
Calcium: 9.6 mg/dL (ref 8.4–10.4)
Creatinine: 0.8 mg/dL (ref 0.6–1.1)
EGFR: >90 mL/min/{1.73_m2} (ref >90)
GLUCOSE: 90 mg/dL (ref 70–140)
POTASSIUM: 3.8 meq/L (ref 3.5–5.1)
SODIUM: 141 meq/L (ref 136–145)
Total Bilirubin: 0.51 mg/dL (ref 0.20–1.20)
Total Protein: 7.8 g/dL (ref 6.4–8.3)

## 2015-12-18 LAB — CBC WITH DIFFERENTIAL/PLATELET
BASO%: 0.3 % (ref 0.0–2.0)
BASOS ABS: 0 10*3/uL (ref 0.0–0.1)
EOS%: 1.3 % (ref 0.0–7.0)
Eosinophils Absolute: 0.1 10*3/uL (ref 0.0–0.5)
HCT: 46 % (ref 34.8–46.6)
HEMOGLOBIN: 15 g/dL (ref 11.6–15.9)
LYMPH%: 27.3 % (ref 14.0–49.7)
MCH: 29.6 pg (ref 25.1–34.0)
MCHC: 32.6 g/dL (ref 31.5–36.0)
MCV: 90.8 fL (ref 79.5–101.0)
MONO#: 0.3 10*3/uL (ref 0.1–0.9)
MONO%: 5.3 % (ref 0.0–14.0)
NEUT#: 3.8 10*3/uL (ref 1.5–6.5)
NEUT%: 65.8 % (ref 38.4–76.8)
Platelets: 209 10*3/uL (ref 145–400)
RBC: 5.07 10*6/uL (ref 3.70–5.45)
RDW: 12.9 % (ref 11.2–14.5)
WBC: 5.8 10*3/uL (ref 3.9–10.3)
lymph#: 1.6 10*3/uL (ref 0.9–3.3)

## 2015-12-18 MED ORDER — IOPAMIDOL (ISOVUE-300) INJECTION 61%
100.0000 mL | Freq: Once | INTRAVENOUS | Status: AC | PRN
  Administered 2015-12-18: 100 mL via INTRAVENOUS

## 2015-12-19 LAB — CEA (PARALLEL TESTING): CEA: 1.2 ng/mL

## 2015-12-19 LAB — CEA: CEA1: 1.7 ng/mL (ref 0.0–4.7)

## 2015-12-25 ENCOUNTER — Other Ambulatory Visit: Payer: Managed Care, Other (non HMO)

## 2015-12-27 ENCOUNTER — Other Ambulatory Visit: Payer: Self-pay | Admitting: Pediatrics

## 2016-01-01 ENCOUNTER — Ambulatory Visit (HOSPITAL_BASED_OUTPATIENT_CLINIC_OR_DEPARTMENT_OTHER): Payer: 59 | Admitting: Oncology

## 2016-01-01 ENCOUNTER — Telehealth: Payer: Self-pay | Admitting: Oncology

## 2016-01-01 VITALS — BP 124/78 | HR 76 | Temp 98.3°F | Resp 18 | Ht 66.0 in | Wt 212.8 lb

## 2016-01-01 DIAGNOSIS — C20 Malignant neoplasm of rectum: Secondary | ICD-10-CM

## 2016-01-01 NOTE — Telephone Encounter (Signed)
Gave and printed appt sched and avs for pt for may 2018

## 2016-01-01 NOTE — Progress Notes (Signed)
Hematology and Oncology Follow Up Visit  [name] [id number] Mar 31, 1966 50 y.o. 01/01/2016 8:40 AM   Principle Diagnosis: 88- year old diagnosed with T2N0 rectal cancer in 05/2012. Her tumor was 7-10 cm from the anal verge.    Prior Therapy: Patient is S/P laparoscopic LAR done on 07/02/2012. The pathology reveled: INVASIVE MODERATELY DIFFERENTIATED ADENOCARCINOMA INVADING INTO BUT NOTTHROUGH THE MUSCULARIS PROPRIA. - NO ANGIOLYMPHATIC INVASION IDENTIFIED. - SIXTEEN LYMPH NODES WITH FOCAL ENDOSALPINGIOSIS, NO EVIDENCE OF METASTATIC CARCINOMA(0/16). - Her tumor is Microsatellite stable.   Current therapy: Observation and follow up.   Interim History: [name] presents for a follow up visit with her mother. Since the last visit, she reports very well without any recent change in her health. She still reports constipation but seems to be manageable. She still requires laxatives to move her bowels on a regular basis. She is no longer reporting any back pain or back issues. She continues to perform activities of daily living without any decline.  She does not report any hematochezia or melena. She does not report any abdominal pain or early satiety. She does not report any headaches or blurry vision or syncope. She does not report any fevers chills or sweats. Her appetite continue to be excellent. She has not reported any frequency urgency or hesitancy. She has not resumed her work-related duties at this time but hoping to do so in the near future. Rest of her review of systems unremarkable.  Medications: I have reviewed the patient's current medications.  Current Outpatient Prescriptions  Medication Sig Dispense Refill  . cyclobenzaprine (FLEXERIL) 10 MG tablet Take 1 tablet by mouth 3 (three) times daily. As needed for spasms  0  . estradiol (ESTRACE) 1 MG tablet TAKE 1 TABLET TWICE A DAY 60 tablet 4  . gabapentin (NEURONTIN) 300 MG capsule Take 300 mg by mouth 3 (three) times daily.   0  . Linaclotide (LINZESS) 290 MCG CAPS capsule Take 1 capsule (290 mcg total) by mouth daily. 30 capsule 11  . oxyCODONE-acetaminophen (PERCOCET) 7.5-325 MG per tablet Take 1 tablet by mouth every 4 (four) hours as needed for pain. 60 tablet 0   No current facility-administered medications for this visit.    Allergies:  Allergies  Allergen Reactions  . Codeine Nausea Only    Confirmed intolerance of codeine verbally with pt in PACU, pt states does not cause any SOB or dyspnea, some nausea only (MN-RN)  . Ultram [Tramadol] Nausea And Vomiting    Ultram allergy also discussed, active N&V occurs with Ultram  . Latex Itching and Rash    Past Medical History, Surgical history, Social history, and Family History were reviewed and updated.   Physical Exam:  Blood pressure 124/78, pulse 76, temperature 98.3 F (36.8 C), temperature source Oral, resp. rate 18, height _0  (1.676 m), weight 212 lb 12.8 oz (96.525 kg), SpO2 100 %. ECOG: 1 General appearance: alert awake without distress. Head: Normocephalic, without obvious abnormality, atraumatic no oral ulcers or lesions. Neck: no adenopathy Lymph nodes: Cervical, supraclavicular, and axillary nodes normal. Heart:regular rate and rhythm, S1, S2 normal, no murmur, click, rub or gallop Lung:chest clear, no wheezing, rales, normal symmetric air entry Abdomin: soft, non-tender, without masses or organomegaly or shifting dullness or ascites. EXT:no erythema, induration, or nodules   Lab Results: Lab Results  Component Value Date   WBC 5.8 12/18/2015   HGB 15.0 12/18/2015   HCT 46.0 12/18/2015   MCV 90.8 12/18/2015   PLT 209 12/18/2015  Chemistry      Component Value Date/Time   NA 141 12/18/2015 0817   NA 139 05/19/2014 2238   K 3.8 12/18/2015 0817   K 3.7 05/19/2014 2238   CL 101 05/19/2014 2238   CL 103 11/19/2012 1005   CO2 25 12/18/2015 0817   CO2 23 05/19/2014 2238   BUN 5.7* 12/18/2015 0817   BUN 14 05/19/2014  2238   CREATININE 0.8 12/18/2015 0817   CREATININE 0.63 05/19/2014 2238      Component Value Date/Time   CALCIUM 9.6 12/18/2015 0817   CALCIUM 9.9 05/19/2014 2238   ALKPHOS 52 12/18/2015 0817   ALKPHOS 48 06/01/2012 1452   AST 22 12/18/2015 0817   AST 16 06/01/2012 1452   ALT 21 12/18/2015 0817   ALT 11 06/01/2012 1452   BILITOT 0.51 12/18/2015 0817   BILITOT 0.4 06/01/2012 1452     EXAM: CT CHEST, ABDOMEN, AND PELVIS WITH CONTRAST  TECHNIQUE: Multidetector CT imaging of the chest, abdomen and pelvis was performed following the standard protocol during bolus administration of intravenous contrast.  CONTRAST: 143m ISOVUE-300 IOPAMIDOL (ISOVUE-300) INJECTION 61%  COMPARISON: 12/12/2014  FINDINGS: CT CHEST FINDINGS  Mediastinum/Lymph Nodes: No masses, pathologically enlarged lymph nodes, or other significant abnormality.  Lungs/Pleura: No pulmonary mass, infiltrate, or effusion.  Musculoskeletal: No chest wall mass or suspicious bone lesions identified.  CT ABDOMEN PELVIS FINDINGS  Hepatobiliary: Subtle 10 mm low-attenuation lesion in dome of the right hepatic lobe on image 44 is stable and less conspicuous than on previous study. No new or enlarging liver lesions identified. Gallbladder is unremarkable.  Pancreas: No mass, inflammatory changes, or other significant abnormality.  Spleen: Within normal limits in size and appearance.  Adrenals/Urinary Tract: No masses identified. No evidence of hydronephrosis.  Stomach/Bowel: No evidence of obstruction, inflammatory process, or abnormal fluid collections. No abnormal areas of bowel wall thickening identified. Normal appendix visualized.  Vascular/Lymphatic: No pathologically enlarged lymph nodes. No evidence of abdominal aortic aneurysm.  Reproductive: Prior hysterectomy noted. Adnexal regions are unremarkable in appearance.  Other: None.  Musculoskeletal: No suspicious bone lesions  identified. Benign L1 vertebral body hemangioma remains stable.  IMPRESSION: Stable exam. No evidence of recurrent or metastatic carcinoma within the chest, abdomen, or pelvis.    Impression and Plan:  50 year old with the following issues:  1. T2N0 rectal cancer diagnosed in 05/2012. She is S/P LAR in 06/2012. She is currently on active surveillance without any recurrent disease.  CT scan and labs from  12/18/2015 were reviewed today and discussed with the patient and  do not show any cancer relapse. The plan is to continue with active surveillance and repeat physical examination and laboratory testing in 12 months. No further follow-up will be needed after that. Imaging studies will be done only as needed.    2. Colonoscopic screen: She is up-to-date at this point and her repeat colonoscopy will be in one year.  3. Hemorrhoids: she is using over the counter preparations follows up with surgery regarding that.  4. Constipation: Seems to be a chronic problem and manageable with laxatives.   5. Follow-up: Will be in 12 months sooner if there is a problem.    [facility] MD 5/19/20178:40 AM

## 2016-02-17 ENCOUNTER — Encounter: Payer: Self-pay | Admitting: Nurse Practitioner

## 2016-02-17 ENCOUNTER — Ambulatory Visit (INDEPENDENT_AMBULATORY_CARE_PROVIDER_SITE_OTHER): Payer: 59 | Admitting: Nurse Practitioner

## 2016-02-17 VITALS — BP 111/80 | HR 79 | Temp 97.9°F | Ht 66.0 in | Wt 209.0 lb

## 2016-02-17 DIAGNOSIS — Z7689 Persons encountering health services in other specified circumstances: Secondary | ICD-10-CM

## 2016-02-17 DIAGNOSIS — Z0189 Encounter for other specified special examinations: Principal | ICD-10-CM

## 2016-02-17 DIAGNOSIS — L0390 Cellulitis, unspecified: Secondary | ICD-10-CM | POA: Diagnosis not present

## 2016-02-17 DIAGNOSIS — L0291 Cutaneous abscess, unspecified: Secondary | ICD-10-CM

## 2016-02-17 MED ORDER — SULFAMETHOXAZOLE-TRIMETHOPRIM 800-160 MG PO TABS: 1.0000 | ORAL_TABLET | Freq: Two times a day (BID) | ORAL | Status: DC

## 2016-02-17 MED ORDER — FLUCONAZOLE 150 MG PO TABS: 150.0000 mg | ORAL_TABLET | Freq: Once | ORAL | Status: DC

## 2016-02-17 NOTE — Patient Instructions (Signed)

## 2016-02-17 NOTE — Progress Notes (Signed)
   Subjective:    Patient ID: [name], female    DOB: 1966-05-22, 50 y.o.   MRN: [id number]  HPI Patient comes in today c/o bump under  Right breast- Notice about 1 month ago- has not changed in size. SHe has some clindamycin left from old rx and she started taking that but that has not helped   Review of Systems  Constitutional: Negative.   Respiratory: Negative.   Cardiovascular: Negative.   Neurological: Negative.   Psychiatric/Behavioral: Negative.   All other systems reviewed and are negative.      Objective:   Physical Exam  Constitutional: She is oriented to person, place, and time. She appears well-developed and well-nourished. No distress.  Cardiovascular: Normal rate, regular rhythm and normal heart sounds.   Pulmonary/Chest: Effort normal and breath sounds normal.  Neurological: She is alert and oriented to person, place, and time.  Skin: Skin is warm.  2cm raised tender lesion right flank   Procedure:  Betadine topical  Lidocaine 2% with epi 91ml local  #11 blade- I&D  Culture obtain  Cleaned with betadine and dressing applied      Assessment & Plan:  1. Encounter for incision and drainage procedure - Anaerobic and Aerobic Culture  2. Cellulitis and abscess Clean area with anibacterial SOap BID - sulfamethoxazole-trimethoprim (BACTRIM DS) 800-160 MG tablet; Take 1 tablet by mouth 2 (two) times daily.  Dispense: 14 tablet; Refill: 0 - fluconazole (DIFLUCAN) 150 MG tablet; Take 1 tablet (150 mg total) by mouth once.  Dispense: 1 tablet; Refill: 0 RTO if not improving  Mary-Margaret Hassell Done, FNP

## 2016-02-21 LAB — ANAEROBIC AND AEROBIC CULTURE

## 2016-04-01 ENCOUNTER — Ambulatory Visit (INDEPENDENT_AMBULATORY_CARE_PROVIDER_SITE_OTHER): Payer: 59 | Admitting: Family

## 2016-04-01 ENCOUNTER — Encounter: Payer: Self-pay | Admitting: Family

## 2016-04-01 VITALS — BP 120/80 | HR 56 | Temp 97.3°F

## 2016-04-01 DIAGNOSIS — L989 Disorder of the skin and subcutaneous tissue, unspecified: Secondary | ICD-10-CM

## 2016-04-01 NOTE — Patient Instructions (Signed)
Mole Excision Moleexcision is a procedure to remove (excise) a mole from your skin. Most moles are noncancerous (benign) and they do not require treatment. But some moles are larger than usual or resemble cancerous moles (atypical moles).  You may have a mole excision if your health care provider wants to remove an atypical mole and test it in a laboratory to determine whether it is cancerous (biopsy). You may also have a mole excision if a mole causes pain or you would like it removed due to its appearance. LET [facility] CARE PROVIDER KNOW ABOUT:  Any allergies you have.  All medicines you are taking, including vitamins, herbs, eye drops, creams, and over-the-counter medicines.  Previous problems you or members of your family have had with the use of anesthetics.  Previous problems you have had with pain medicines.  Any blood disorders you have.  Previous surgeries you have had.  Medical conditions you have. RISKS AND COMPLICATIONS Generally, this is a safe procedure. However, problems may occur, including:  Excessive bleeding.  Infection.  Scarring. BEFORE THE PROCEDURE  Ask your health care provider about:  Changing or stopping your regular medicines. This is especially important if you are taking diabetes medicines or blood thinners.  Taking medicines such as aspirin and ibuprofen. These medicines can thin your blood. Do not take these medicines before your procedure if your health care provider instructs you not to.  Follow your health care provider's instructions about eating or drinking restrictions.  Ask your health care provider if you should plan to have someone take you home after the procedure. PROCEDURE  An IV tube will be inserted in a vein.  You will be given one of the following:  A medicine that numbs the area (local anesthetic).  A medicine that makes you fall asleep (general anesthetic).  Your health care provider will outline the mole with ink and  mark the center with a dot. This serves as a guide during the procedure.  Depending on the size of your mole, your health care provider will remove it using:  A surgical blade. This is used to remove small moles.  A hollow tube with a sharp end (punch device). This may be used for larger moles.  Your health care provider will use stitches (sutures) to close the wound in the skin where the mole was removed (excision site). The procedure may vary among health care providers and hospitals. AFTER THE PROCEDURE  Your blood pressure, heart rate, breathing rate, and blood oxygen level may be monitored often until the medicines you were given have worn off.   This information is not intended to replace advice given to you by your health care provider. Make sure you discuss any questions you have with your health care provider.   Document Released: 07/29/2000 Document Revised: 08/22/2014 Document Reviewed: 04/02/2014 Elsevier Interactive Patient Education [employer].

## 2016-04-01 NOTE — Progress Notes (Signed)
   Subjective:    Patient ID: [name], female    DOB: 1965-11-28, 50 y.o.   MRN: [id number]  HPI PT presents to the office today to have a mole checked. Pt states she has noticed it months ago, but it was flat, but not has become raised. PT denies any changes in color or shape. PT denies any pain at this time.    Review of Systems  Skin: Positive for wound.  All other systems reviewed and are negative.      Objective:   Physical Exam  Constitutional: She is oriented to person, place, and time. She appears well-developed and well-nourished. No distress.  HENT:  Head: Normocephalic.  Eyes: Pupils are equal, round, and reactive to light.  Neck: Normal range of motion. Neck supple. No thyromegaly present.  Cardiovascular: Normal rate, regular rhythm, normal heart sounds and intact distal pulses.   No murmur heard. Pulmonary/Chest: Effort normal and breath sounds normal. No respiratory distress. She has no wheezes.  Abdominal: Soft. Bowel sounds are normal. She exhibits no distension. There is no tenderness.  Musculoskeletal: Normal range of motion. She exhibits no edema or tenderness.  Neurological: She is alert and oriented to person, place, and time.  Skin: Skin is warm and dry.  Discolored skin lesion on lower right back, no redness, or discharge present, appro 0.38mmX0.3mm   Psychiatric: She has a normal mood and affect. Her behavior is normal. Judgment and thought content normal.  Vitals reviewed.   BP 120/80   Pulse (!) 56   Temp 97.3 F (36.3 C) (Oral)        Assessment & Plan:  1. Skin lesion of back -Do not pick at -Report any changes in color size or shape -RTO prn  - Ambulatory referral to Dermatology  Evelina Dun, FNP

## 2016-04-13 ENCOUNTER — Other Ambulatory Visit: Payer: Self-pay | Admitting: Gastroenterology

## 2016-05-27 ENCOUNTER — Ambulatory Visit: Payer: Self-pay | Admitting: Family Medicine

## 2016-06-07 ENCOUNTER — Ambulatory Visit: Payer: Self-pay | Admitting: Pediatrics

## 2016-06-08 ENCOUNTER — Ambulatory Visit (INDEPENDENT_AMBULATORY_CARE_PROVIDER_SITE_OTHER): Payer: 59 | Admitting: Pediatrics

## 2016-06-08 ENCOUNTER — Encounter: Payer: Self-pay | Admitting: Pediatrics

## 2016-06-08 VITALS — BP 133/89 | HR 84 | Temp 98.1°F | Ht 66.0 in | Wt 209.4 lb

## 2016-06-08 DIAGNOSIS — L0291 Cutaneous abscess, unspecified: Secondary | ICD-10-CM | POA: Diagnosis not present

## 2016-06-08 NOTE — Progress Notes (Signed)
  Subjective:   Patient ID: [name], female    DOB: Jan 10, 1966, 50 y.o.   MRN: [id number] CC: Knot right breast  HPI: [name] is a 50 y.o. female presenting for Knot right breast  Has been there for a coupl eof weeks Getting bigger, more painful and tender No discharge Has had same area lanced in past Helped with the pain Per chart review wound culture was negative in 02/2016 at that time No fevers, chills, feeling well otherwise  Relevant past medical, surgical, family and social history reviewed. Allergies and medications reviewed and updated. History  Smoking Status  . Never Smoker  Smokeless Tobacco  . Never Used   ROS: Per HPI   Objective:    BP 133/89   Pulse 84   Temp 98.1 F (36.7 C) (Oral)   Ht 5\' 6"  (1.676 m)   Wt 209 lb 6.4 oz (95 kg)   BMI 33.80 kg/m   Wt Readings from Last 3 Encounters:  06/08/16 209 lb 6.4 oz (95 kg)  02/17/16 209 lb (94.8 kg)  01/01/16 212 lb 12.8 oz (96.5 kg)    Gen: NAD, alert, cooperative with exam, NCAT EYES: EOMI, no conjunctival injection, or no icterus Ext: No edema, warm Neuro: Alert and oriented MSK: normal muscle bulk Skin: 2 cm fluctuant area R chest below R breast with some redness, darkened skin over nodule, some tenderness with palpation  Assessment & Plan:  [name] was seen today for knot below right breast.  Diagnoses and all orders for this visit:  Abscess Second time abscess in same location Discussed possibility it is inclusion cyst, needing referral to surgery for removal to prevent future events I and D today, see procedure below Will let me know if pain worsens or returns No surrounding redness, does not appear to have cellulitis as well No PO antibiotics now -     Anaerobic and Aerobic Culture  PROCEDURE: After risks, benefits and alternatives discussed with pt and pt decided to procedure with I and D of likely abscess, area was prepped with betadine. 1 mL of lidocaine with epi  was injected below the skin. #11 scalpel was used to make a 80mm incision at surface of nodule with return of some purulence and blood. Approx 1 cm of iodoform gauze was packed into incision. Pt tolerated procedure well.    Follow up plan: As needed Assunta Found, MD [place]

## 2016-06-12 LAB — ANAEROBIC AND AEROBIC CULTURE

## 2016-09-09 DIAGNOSIS — M5136 Other intervertebral disc degeneration, lumbar region: Secondary | ICD-10-CM | POA: Diagnosis not present

## 2016-09-09 DIAGNOSIS — M961 Postlaminectomy syndrome, not elsewhere classified: Secondary | ICD-10-CM | POA: Diagnosis not present

## 2016-09-16 DIAGNOSIS — M961 Postlaminectomy syndrome, not elsewhere classified: Secondary | ICD-10-CM | POA: Diagnosis not present

## 2016-09-22 DIAGNOSIS — M25551 Pain in right hip: Secondary | ICD-10-CM | POA: Diagnosis not present

## 2016-09-23 ENCOUNTER — Encounter: Payer: Self-pay | Admitting: Physician Assistant

## 2016-09-23 ENCOUNTER — Telehealth: Payer: Self-pay | Admitting: Gastroenterology

## 2016-09-23 ENCOUNTER — Ambulatory Visit (INDEPENDENT_AMBULATORY_CARE_PROVIDER_SITE_OTHER): Payer: 59 | Admitting: Physician Assistant

## 2016-09-23 VITALS — BP 103/72 | HR 82 | Temp 97.1°F | Ht 66.0 in | Wt 210.0 lb

## 2016-09-23 DIAGNOSIS — F5101 Primary insomnia: Secondary | ICD-10-CM

## 2016-09-23 DIAGNOSIS — K644 Residual hemorrhoidal skin tags: Secondary | ICD-10-CM

## 2016-09-23 MED ORDER — HYDROCORTISONE 2.5 % RE CREA: 1.0000 "application " | TOPICAL_CREAM | Freq: Two times a day (BID) | RECTAL | 0 refills | Status: DC

## 2016-09-23 MED ORDER — DOXEPIN HCL 3 MG PO TABS: 3.0000 mg | ORAL_TABLET | Freq: Every day | ORAL | 2 refills | Status: DC

## 2016-09-23 NOTE — Patient Instructions (Signed)
In a few days you may receive a survey in the mail or online from [employer] regarding your visit with [place] today. Please take a moment to fill this out. Your feedback is very important to our whole office. It can help [place] better understand your needs as well as improve your experience and satisfaction. Thank you for taking your time to complete it. We care about you.  Particia Nearing, PA-C   Hemorrhoids Hemorrhoids are swollen veins in and around the rectum or anus. There are two types of hemorrhoids:  Internal hemorrhoids. These occur in the veins that are just inside the rectum. They may poke through to the outside and become irritated and painful.  External hemorrhoids. These occur in the veins that are outside of the anus and can be felt as a painful swelling or hard lump near the anus. Most hemorrhoids do not cause serious problems, and they can be managed with home treatments such as diet and lifestyle changes. If home treatments do not help your symptoms, procedures can be done to shrink or remove the hemorrhoids. What are the causes? This condition is caused by increased pressure in the anal area. This pressure may result from various things, including:  Constipation.  Straining to have a bowel movement.  Diarrhea.  Pregnancy.  Obesity.  Sitting for long periods of time.  Heavy lifting or other activity that causes you to strain.  Anal sex. What are the signs or symptoms? Symptoms of this condition include:  Pain.  Anal itching or irritation.  Rectal bleeding.  Leakage of stool (feces).  Anal swelling.  One or more lumps around the anus. How is this diagnosed? This condition can often be diagnosed through a visual exam. Other exams or tests may also be done, such as:  Examination of the rectal area with a gloved hand (digital rectal exam).  Examination of the anal canal using a small tube (anoscope).  A blood test, if you have lost a significant amount of  blood.  A test to look inside the colon (sigmoidoscopy or colonoscopy). How is this treated? This condition can usually be treated at home. However, various procedures may be done if dietary changes, lifestyle changes, and other home treatments do not help your symptoms. These procedures can help make the hemorrhoids smaller or remove them completely. Some of these procedures involve surgery, and others do not. Common procedures include:  Rubber band ligation. Rubber bands are placed at the base of the hemorrhoids to cut off the blood supply to them.  Sclerotherapy. Medicine is injected into the hemorrhoids to shrink them.  Infrared coagulation. A type of light energy is used to get rid of the hemorrhoids.  Hemorrhoidectomy surgery. The hemorrhoids are surgically removed, and the veins that supply them are tied off.  Stapled hemorrhoidopexy surgery. A circular stapling device is used to remove the hemorrhoids and use staples to cut off the blood supply to them. Follow these instructions at home: Eating and drinking  Eat foods that have a lot of fiber in them, such as whole grains, beans, nuts, fruits, and vegetables. Ask your health care provider about taking products that have added fiber (fiber supplements).  Drink enough fluid to keep your urine clear or pale yellow. Managing pain and swelling  Take warm sitz baths for 20 minutes, 3-4 times a day to ease pain and discomfort.  If directed, apply ice to the affected area. Using ice packs between sitz baths may be helpful.  Put ice in  a plastic bag.  Place a towel between your skin and the bag.  Leave the ice on for 20 minutes, 2-3 times a day. General instructions  Take over-the-counter and prescription medicines only as told by your health care provider.  Use medicated creams or suppositories as told.  Exercise regularly.  Go to the bathroom when you have the urge to have a bowel movement. Do not wait.  Avoid straining to  have bowel movements.  Keep the anal area dry and clean. Use wet toilet paper or moist towelettes after a bowel movement.  Do not sit on the toilet for long periods of time. This increases blood pooling and pain. Contact a health care provider if:  You have increasing pain and swelling that are not controlled by treatment or medicine.  You have uncontrolled bleeding.  You have difficulty having a bowel movement, or you are unable to have a bowel movement.  You have pain or inflammation outside the area of the hemorrhoids. This information is not intended to replace advice given to you by your health care provider. Make sure you discuss any questions you have with your health care provider. Document Released: 07/29/2000 Document Revised: 12/30/2015 Document Reviewed: 04/15/2015 Elsevier Interactive Patient Education  2017 [employer].

## 2016-09-23 NOTE — Telephone Encounter (Signed)
Pt called to see when her next colonoscopy was due. I told her the last one was done in 2014 and recommended that she has another done in 10 yrs in 2024. Pt said that she had colorectal cancer and was wondering if she needed to wait that long. Please advise and call [phone]

## 2016-09-23 NOTE — Progress Notes (Signed)
BP 103/72   Pulse 82   Temp 97.1 F (36.2 C) (Oral)   Ht 5\' 6"  (1.676 m)   Wt 210 lb (95.3 kg)   BMI 33.89 kg/m    Subjective:    Patient ID: [name], female    DOB: [birth year], 51 y.o.   MRN: [id number]  HPI: [name] is a 51 y.o. female presenting on 09/23/2016 for swollen area around rectum (1 month)  Lesion at anus: Patient has had this going on for about 2 weeks. She has had long-standing constipation that she takes Linzess 4. She states she had decrease it to 3-4 doses per week. She has noticed the increase in faculty of passing stool, accompanied with straining. She denies seeing any blood in or around her stool. There is no blood when she wipes.  Insomnia: Patient has had ongoing insomnia issues. She has known degenerative disc disease in her spine. Dr. Caswell Corwin is her primary doctor on this. She doesn't like to take the pain medicine because it worsens her constipation. She does have to use it occasionally. She would like for the ability to take something for sleep. All over-the-counter medicines have not been fully effective.  Relevant past medical, surgical, family and social history reviewed and updated as indicated. Allergies and medications reviewed and updated.  Past Medical History:  Diagnosis Date  . Anxiety    NEW-DUE TO ANXIETY OVER DX OF CANCER  . Blood transfusion 1995  . Cancer (HCC)    RECTAL CANCER  . Constipation    SOME BLOOD IN STOOL  . GERD (gastroesophageal reflux disease)    occasionally-NO MEDS  . Kidney stone   . PONV (postoperative nausea and vomiting)    used a scop patch last surgery-was better    Past Surgical History:  Procedure Laterality Date  . ABDOMINAL HYSTERECTOMY  1995   partial  . BACK SURGERY  2015  . COLON SURGERY    . COLONOSCOPY  06/01/2012   Procedure: COLONOSCOPY;  Surgeon: Danie Binder, MD;  Location: [facility];  Service: Endoscopy;  Laterality: N/A;  1:30PM  . COLONOSCOPY N/A  06/14/2013   [id number] diverticulosis/normal anastomosis  . endoscopic left fallopian tube removed  several yrs ago  . EUS  06/06/2012   Procedure: LOWER ENDOSCOPIC ULTRASOUND (EUS);  Surgeon: Arta Silence, MD;  Location: [facility];  Service: Endoscopy;  Laterality: N/A;  . EXAMINATION UNDER ANESTHESIA  09/07/2012   Procedure: EXAM UNDER ANESTHESIA;  Surgeon: Stark Klein, MD;  Location: [facility];  Service: General;  Laterality: N/A;  . EXCISION/RELEASE BURSA HIP  09/15/2011   Dr Tonita Cong EXCISION/RELEASE BURSA HIP;  Surgeon: Johnn Hai, MD;  Location: [facility];  Service: Orthopedics;  Laterality: Left;  Excision of Trochanteric Bursitis  . FLEXIBLE SIGMOIDOSCOPY N/A 03/22/2013   Procedure: FLEXIBLE SIGMOIDOSCOPY;  Surgeon: Danie Binder, MD;  [facility]: [facility];  Service: Endoscopy;  Laterality: N/A;  2:00  . LAPAROSCOPIC LOW ANTERIOR RESECTION  07/02/2012   Procedure: LAPAROSCOPIC LOW ANTERIOR RESECTION;  Surgeon: Stark Klein, MD;  Location: [facility];  Service: General;  Laterality: N/A;  . left ankle surgery for fx  several yrs ago  . LUMBAR LAMINECTOMY/DECOMPRESSION MICRODISCECTOMY N/A 05/14/2014   Procedure: LUMBAR DECOMPRESSION L2-L3;  Surgeon: Johnn Hai, MD;  Location: [facility];  Service: Orthopedics;  Laterality: N/A;  . PROCTOSCOPY  09/07/2012   Procedure: PROCTOSCOPY;  Surgeon: Stark Klein, MD;  Location: [facility];  Service: General;  Laterality: N/A;    Review of Systems  Constitutional: Negative.  Negative for fatigue and unexpected weight change.  HENT: Negative.   Eyes: Negative.   Respiratory: Negative.   Gastrointestinal: Positive for constipation and rectal pain. Negative for anal bleeding and blood in stool.  Genitourinary: Negative.   Psychiatric/Behavioral: Positive for sleep disturbance. Negative for decreased concentration, dysphoric mood and suicidal ideas.    Allergies as of 09/23/2016      Reactions   Codeine Nausea Only    Confirmed intolerance of codeine verbally with pt in PACU, pt states does not cause any SOB or dyspnea, some nausea only (MN-RN)   Ultram [tramadol] Nausea And Vomiting   Ultram allergy also discussed, active N&V occurs with Ultram   Latex Itching, Rash      Medication List       Accurate as of 09/23/16  9:16 AM. Always use your most recent med list.          Doxepin HCl 3 MG Tabs Take 1 tablet (3 mg total) by mouth at bedtime.   estradiol 1 MG tablet Commonly known as:  ESTRACE TAKE 1 TABLET TWICE A DAY   gabapentin 300 MG capsule Commonly known as:  NEURONTIN Take 300 mg by mouth 3 (three) times daily.   hydrocortisone 2.5 % rectal cream Commonly known as:  ANUSOL-HC Place 1 application rectally 2 (two) times daily.   LINZESS 290 MCG Caps capsule Generic drug:  linaclotide TAKE 1 CAPSULE EVERY DAY   oxyCODONE-acetaminophen 7.5-325 MG tablet Commonly known as:  PERCOCET Take 1 tablet by mouth every 4 (four) hours as needed for pain.          Objective:    BP 103/72   Pulse 82   Temp 97.1 F (36.2 C) (Oral)   Ht 5\' 6"  (1.676 m)   Wt 210 lb (95.3 kg)   BMI 33.89 kg/m   Allergies  Allergen Reactions  . Codeine Nausea Only    Confirmed intolerance of codeine verbally with pt in PACU, pt states does not cause any SOB or dyspnea, some nausea only (MN-RN)  . Ultram [Tramadol] Nausea And Vomiting    Ultram allergy also discussed, active N&V occurs with Ultram  . Latex Itching and Rash    Physical Exam  Constitutional: She is oriented to person, place, and time. She appears well-developed and well-nourished.  HENT:  Head: Normocephalic and atraumatic.  Eyes: Conjunctivae and EOM are normal. Pupils are equal, round, and reactive to light.  Cardiovascular: Normal rate, regular rhythm, normal heart sounds and intact distal pulses.   Pulmonary/Chest: Effort normal and breath sounds normal.  Abdominal: Soft. Bowel sounds are normal.  Genitourinary:    Pelvic  exam was performed with patient in the knee-chest position.  Genitourinary Comments: Nonthrombosed hemorrhoid, nontender, at 12:00 position to anus.  Neurological: She is alert and oriented to person, place, and time. She has normal reflexes.  Skin: Skin is warm and dry. No rash noted.  Psychiatric: She has a normal mood and affect. Her behavior is normal. Judgment and thought content normal.        Assessment & Plan:   1. External hemorrhoid - hydrocortisone (ANUSOL-HC) 2.5 % rectal cream; Place 1 application rectally 2 (two) times daily.  Dispense: 30 g; Refill: 0  2. Primary insomnia - Doxepin HCl 3 MG TABS; Take 1 tablet (3 mg total) by mouth at bedtime.  Dispense: 30 tablet; Refill: 2   Continue all other maintenance medications as listed  above.  Follow up plan: Return if symptoms worsen or fail to improve.  Educational handout [name] for hemorrhoids  Terald Sleeper PA-C [facility] [address]  [place], [place] [address] [phone]   09/23/2016, 9:16 AM

## 2016-09-24 DIAGNOSIS — M961 Postlaminectomy syndrome, not elsewhere classified: Secondary | ICD-10-CM | POA: Diagnosis not present

## 2016-09-24 DIAGNOSIS — M5441 Lumbago with sciatica, right side: Secondary | ICD-10-CM | POA: Diagnosis not present

## 2016-09-24 DIAGNOSIS — G8929 Other chronic pain: Secondary | ICD-10-CM | POA: Diagnosis not present

## 2016-09-26 NOTE — Telephone Encounter (Signed)
Forwarding to Dr. Fields to advise! 

## 2016-09-26 NOTE — Telephone Encounter (Signed)
PLEASE CALL PT. SHE NEEDS TCS IN OCT 2019.

## 2016-09-27 NOTE — Telephone Encounter (Signed)
PT is aware. Routing to Manuela Schwartz to nic the change from 2024 to 2019.

## 2016-09-27 NOTE — Telephone Encounter (Signed)
Reminder in epic °

## 2016-10-22 ENCOUNTER — Telehealth: Payer: Self-pay | Admitting: *Deleted

## 2016-10-22 MED ORDER — FLUCONAZOLE 150 MG PO TABS: 150.0000 mg | ORAL_TABLET | Freq: Once | ORAL | 0 refills | Status: AC

## 2016-10-22 NOTE — Telephone Encounter (Signed)
Pt would like yeast med sent in to [facility]. Thank you

## 2016-10-26 ENCOUNTER — Telehealth: Payer: Self-pay

## 2016-10-26 ENCOUNTER — Other Ambulatory Visit: Payer: Self-pay | Admitting: Nurse Practitioner

## 2016-10-26 MED ORDER — DOXEPIN HCL 6 MG PO TABS: 1.0000 | ORAL_TABLET | Freq: Every evening | ORAL | 3 refills | Status: DC

## 2016-10-26 NOTE — Telephone Encounter (Signed)
That is fine I will send in rx- let patient know

## 2016-10-26 NOTE — Telephone Encounter (Signed)
Left detailed message per dpr  

## 2016-10-26 NOTE — Telephone Encounter (Signed)
Received fax from [facility] stating that patient is wanting to increase Silenor from 3mg  to 6mg . Please advise

## 2016-11-10 ENCOUNTER — Ambulatory Visit (INDEPENDENT_AMBULATORY_CARE_PROVIDER_SITE_OTHER): Payer: 59 | Admitting: Family Medicine

## 2016-11-10 ENCOUNTER — Encounter: Payer: Self-pay | Admitting: Family Medicine

## 2016-11-10 VITALS — BP 130/86 | HR 112 | Temp 100.8°F | Ht 66.0 in | Wt 212.0 lb

## 2016-11-10 DIAGNOSIS — J101 Influenza due to other identified influenza virus with other respiratory manifestations: Secondary | ICD-10-CM

## 2016-11-10 DIAGNOSIS — R52 Pain, unspecified: Secondary | ICD-10-CM | POA: Diagnosis not present

## 2016-11-10 LAB — VERITOR FLU A/B WAIVED
INFLUENZA A: NEGATIVE
Influenza B: POSITIVE — AB

## 2016-11-10 MED ORDER — OSELTAMIVIR PHOSPHATE 75 MG PO CAPS: 75.0000 mg | ORAL_CAPSULE | Freq: Two times a day (BID) | ORAL | 0 refills | Status: DC

## 2016-11-10 NOTE — Progress Notes (Signed)
   HPI  Patient presents today here with fever and flulike illness.  Patient explains that she's had one day onset of headache, cough, congestion, body aches. He is also had chills and cough.  She has mild shortness of breath. She states that she has a severe headache and that her muscles hurt everywhere.  She's tolerating food and fluids like usual. She went to work today, she had a sick contact at work a few days ago  PMH: Smoking status noted ROS: Per HPI  Objective: BP 130/86   Pulse (!) 112   Temp (!) 100.8 F (38.2 C) (Oral)   Ht 5\' 6"  (1.676 m)   Wt 212 lb (96.2 kg)   BMI 34.22 kg/m  Gen: NAD, alert, cooperative with exam HEENT: NCAT, oropharynx moist and clear, TMs normal bilaterally CV: RRR, good S1/S2, no murmur Resp: CTABL, no wheezes, non-labored Ext: No edema, warm Neuro: Alert and oriented, No gross deficits  Assessment and plan:  # Influenza B Treat with Tamiflu I discussed supportive care and usual course of illness. Return to clinic with any concerns or worsening symptoms.  Note written for work     Orders Placed This Encounter  Procedures  . Veritor Flu A/B Waived    Order Specific Question:   Source    Answer:   nasal    Meds ordered this encounter  Medications  . oseltamivir (TAMIFLU) 75 MG capsule    Sig: Take 1 capsule (75 mg total) by mouth 2 (two) times daily.    Dispense:  10 capsule    Refill:  Seven Fields, MD [facility] 11/10/2016, 4:47 PM

## 2016-11-10 NOTE — Patient Instructions (Signed)
Great to meet you!  Start tamiflu as soon as possible  Take tylenol or ibuprofen as needed for fever and aches or pains.    Influenza, Adult Influenza, more commonly known as "the flu," is a viral infection that primarily affects the respiratory tract. The respiratory tract includes organs that help you breathe, such as the lungs, nose, and throat. The flu causes many common cold symptoms, as well as a high fever and body aches. The flu spreads easily from person to person (is contagious). Getting a flu shot (influenza vaccination) every year is the best way to prevent influenza. What are the causes? Influenza is caused by a virus. You can catch the virus by:  Breathing in droplets from an infected person's cough or sneeze.  Touching something that was recently contaminated with the virus and then touching your mouth, nose, or eyes. What increases the risk? The following factors may make you more likely to get the flu:  Not cleaning your hands frequently with soap and water or alcohol-based hand sanitizer.  Having close contact with many people during cold and flu season.  Touching your mouth, eyes, or nose without washing or sanitizing your hands first.  Not drinking enough fluids or not eating a healthy diet.  Not getting enough sleep or exercise.  Being under a high amount of stress.  Not getting a yearly (annual) flu shot. You may be at a higher risk of complications from the flu, such as a severe lung infection (pneumonia), if you:  Are over the age of 59.  Are pregnant.  Have a weakened disease-fighting system (immune system). You may have a weakened immune system if you:  Have HIV or AIDS.  Are undergoing chemotherapy.  Aretaking medicines that reduce the activity of (suppress) the immune system.  Have a long-term (chronic) illness, such as heart disease, kidney disease, diabetes, or lung disease.  Have a liver disorder.  Are obese.  Have anemia. What are  the signs or symptoms? Symptoms of this condition typically last 4-10 days and may include:  Fever.  Chills.  Headache, body aches, or muscle aches.  Sore throat.  Cough.  Runny or congested nose.  Chest discomfort and cough.  Poor appetite.  Weakness or tiredness (fatigue).  Dizziness.  Nausea or vomiting. How is this diagnosed? This condition may be diagnosed based on your medical history and a physical exam. Your health care provider may do a nose or throat swab test to confirm the diagnosis. How is this treated? If influenza is detected early, you can be treated with antiviral medicine that can reduce the length of your illness and the severity of your symptoms. This medicine may be given by mouth (orally) or through an IV tube that is inserted in one of your veins. The goal of treatment is to relieve symptoms by taking care of yourself at home. This may include taking over-the-counter medicines, drinking plenty of fluids, and adding humidity to the air in your home. In some cases, influenza goes away on its own. Severe influenza or complications from influenza may be treated in a hospital. Follow these instructions at home:  Take over-the-counter and prescription medicines only as told by your health care provider.  Use a cool mist humidifier to add humidity to the air in your home. This can make breathing easier.  Rest as needed.  Drink enough fluid to keep your urine clear or pale yellow.  Cover your mouth and nose when you cough or sneeze.  Wash  your hands with soap and water often, especially after you cough or sneeze. If soap and water are not available, use hand sanitizer.  Stay home from work or school as told by your health care provider. Unless you are visiting your health care provider, try to avoid leaving home until your fever has been gone for 24 hours without the use of medicine.  Keep all follow-up visits as told by your health care provider. This is  important. How is this prevented?  Getting an annual flu shot is the best way to avoid getting the flu. You may get the flu shot in late summer, fall, or winter. Ask your health care provider when you should get your flu shot.  Wash your hands often or use hand sanitizer often.  Avoid contact with people who are sick during cold and flu season.  Eat a healthy diet, drink plenty of fluids, get enough sleep, and exercise regularly. Contact a health care provider if:  You develop new symptoms.  You have:  Chest pain.  Diarrhea.  A fever.  Your cough gets worse.  You produce more mucus.  You feel nauseous or you vomit. Get help right away if:  You develop shortness of breath or difficulty breathing.  Your skin or nails turn a bluish color.  You have severe pain or stiffness in your neck.  You develop a sudden headache or sudden pain in your face or ear.  You cannot stop vomiting. This information is not intended to replace advice given to you by your health care provider. Make sure you discuss any questions you have with your health care provider. Document Released: 07/29/2000 Document Revised: 01/07/2016 Document Reviewed: 05/26/2015 Elsevier Interactive Patient Education  2017 [employer].

## 2016-11-14 ENCOUNTER — Telehealth: Payer: Self-pay | Admitting: Family Medicine

## 2016-11-14 MED ORDER — BENZONATATE 100 MG PO CAPS: 100.0000 mg | ORAL_CAPSULE | Freq: Two times a day (BID) | ORAL | 0 refills | Status: DC | PRN

## 2016-11-14 NOTE — Telephone Encounter (Signed)
Pt has continued dry cough Wants RX  Please advise

## 2016-11-14 NOTE — Telephone Encounter (Signed)
Cough can be expected to last a few weeks, even after acute viral symptoms resolve.   Laroy Apple, MD [facility] 11/14/2016, 12:12 PM

## 2016-11-14 NOTE — Telephone Encounter (Signed)
Pt aware Rx was sent in & that cough may last a few weeks

## 2016-11-18 ENCOUNTER — Ambulatory Visit: Payer: Self-pay | Admitting: Orthopedic Surgery

## 2016-11-23 ENCOUNTER — Encounter: Payer: Self-pay | Admitting: *Deleted

## 2016-12-12 DIAGNOSIS — L720 Epidermal cyst: Secondary | ICD-10-CM | POA: Diagnosis not present

## 2016-12-30 ENCOUNTER — Ambulatory Visit (HOSPITAL_BASED_OUTPATIENT_CLINIC_OR_DEPARTMENT_OTHER): Payer: 59 | Admitting: Oncology

## 2016-12-30 ENCOUNTER — Other Ambulatory Visit (HOSPITAL_BASED_OUTPATIENT_CLINIC_OR_DEPARTMENT_OTHER): Payer: 59

## 2016-12-30 VITALS — BP 122/82 | HR 82 | Temp 98.3°F | Resp 18 | Ht 66.0 in | Wt 210.3 lb

## 2016-12-30 DIAGNOSIS — K5900 Constipation, unspecified: Secondary | ICD-10-CM

## 2016-12-30 DIAGNOSIS — C20 Malignant neoplasm of rectum: Secondary | ICD-10-CM

## 2016-12-30 LAB — CBC WITH DIFFERENTIAL/PLATELET
BASO%: 0.2 % (ref 0.0–2.0)
Basophils Absolute: 0 10*3/uL (ref 0.0–0.1)
EOS ABS: 0.1 10*3/uL (ref 0.0–0.5)
EOS%: 2.9 % (ref 0.0–7.0)
HCT: 42.3 % (ref 34.8–46.6)
HEMOGLOBIN: 13.9 g/dL (ref 11.6–15.9)
LYMPH%: 35.4 % (ref 14.0–49.7)
MCH: 30.3 pg (ref 25.1–34.0)
MCHC: 32.9 g/dL (ref 31.5–36.0)
MCV: 92.2 fL (ref 79.5–101.0)
MONO#: 0.3 10*3/uL (ref 0.1–0.9)
MONO%: 6.8 % (ref 0.0–14.0)
NEUT#: 2.2 10*3/uL (ref 1.5–6.5)
NEUT%: 54.7 % (ref 38.4–76.8)
PLATELETS: 198 10*3/uL (ref 145–400)
RBC: 4.59 10*6/uL (ref 3.70–5.45)
RDW: 12.8 % (ref 11.2–14.5)
WBC: 4.1 10*3/uL (ref 3.9–10.3)
lymph#: 1.5 10*3/uL (ref 0.9–3.3)

## 2016-12-30 LAB — COMPREHENSIVE METABOLIC PANEL
ALBUMIN: 3.8 g/dL (ref 3.5–5.0)
ALK PHOS: 56 U/L (ref 40–150)
ALT: 9 U/L (ref 0–55)
ANION GAP: 10 meq/L (ref 3–11)
AST: 13 U/L (ref 5–34)
BILIRUBIN TOTAL: 0.37 mg/dL (ref 0.20–1.20)
BUN: 8.9 mg/dL (ref 7.0–26.0)
CO2: 25 mEq/L (ref 22–29)
Calcium: 9.5 mg/dL (ref 8.4–10.4)
Chloride: 106 mEq/L (ref 98–109)
Creatinine: 0.8 mg/dL (ref 0.6–1.1)
GLUCOSE: 76 mg/dL (ref 70–140)
Potassium: 3.8 mEq/L (ref 3.5–5.1)
SODIUM: 141 meq/L (ref 136–145)
Total Protein: 7.3 g/dL (ref 6.4–8.3)

## 2016-12-30 LAB — CEA (IN HOUSE-CHCC): CEA (CHCC-In House): <1 ng/mL (ref 0.00–5.00)

## 2016-12-30 NOTE — Progress Notes (Signed)
Hematology and Oncology Follow Up Visit  [name] [id number] 06/26/1966 51 y.o. 12/30/2016 9:15 AM   Principle Diagnosis: 51year old diagnosed with T2N0 rectal cancer in 05/2012. Her tumor was 7-10 cm from the anal verge.    Prior Therapy: Patient is S/P laparoscopic LAR done on 07/02/2012. The pathology reveled: INVASIVE MODERATELY DIFFERENTIATED ADENOCARCINOMA INVADING INTO BUT NOTTHROUGH THE MUSCULARIS PROPRIA. - NO ANGIOLYMPHATIC INVASION IDENTIFIED. - SIXTEEN LYMPH NODES WITH FOCAL ENDOSALPINGIOSIS, NO EVIDENCE OF METASTATIC CARCINOMA(0/16). - Her tumor is Microsatellite stable.   Current therapy: Observation and follow up.   Interim History: [name] presents for a follow up visit with her mother. Since the last visit, she continues to do very well without any recent GI symptoms. She denied nausea, vomiting or hematochezia. She does report periodic constipation and is manageable. She did have flulike symptoms earlier in the year which has resolved at this time. She continues to perform activities of daily living without any decline.  She does not report any hematochezia or melena. She does not report any abdominal pain or early satiety. She does not report any headaches or blurry vision or syncope. She does not report any fevers chills or sweats. Her appetite continue to be excellent. She has not reported any frequency urgency or hesitancy. She has not resumed her work-related duties at this time but hoping to do so in the near future. Rest of her review of systems unremarkable.  Medications: I have reviewed the patient's current medications.  Current Outpatient Prescriptions  Medication Sig Dispense Refill  . benzonatate (TESSALON) 100 MG capsule Take 1 capsule (100 mg total) by mouth 2 (two) times daily as needed for cough. 20 capsule 0  . Doxepin HCl 6 MG TABS Take 1 tablet (6 mg total) by mouth Nightly. 30 tablet 3  . estradiol (ESTRACE) 1 MG tablet TAKE 1 TABLET  TWICE A DAY 60 tablet 4  . gabapentin (NEURONTIN) 300 MG capsule Take 300 mg by mouth 3 (three) times daily.  0  . hydrocortisone (ANUSOL-HC) 2.5 % rectal cream Place 1 application rectally 2 (two) times daily. 30 g 0  . LINZESS 290 MCG CAPS capsule TAKE 1 CAPSULE EVERY DAY 30 capsule 7  . oseltamivir (TAMIFLU) 75 MG capsule Take 1 capsule (75 mg total) by mouth 2 (two) times daily. 10 capsule 0  . oxyCODONE-acetaminophen (PERCOCET) 7.5-325 MG per tablet Take 1 tablet by mouth every 4 (four) hours as needed for pain. 60 tablet 0   No current facility-administered medications for this visit.     Allergies:  Allergies  Allergen Reactions  . Codeine Nausea Only    Confirmed intolerance of codeine verbally with pt in PACU, pt states does not cause any SOB or dyspnea, some nausea only (MN-RN)  . Ultram [Tramadol] Nausea And Vomiting    Ultram allergy also discussed, active N&V occurs with Ultram  . Latex Itching and Rash    Past Medical History, Surgical history, Social history, and Family History were reviewed and updated.   Physical Exam: Vital was personally reviewed today and all within normal range. ECOG: 1 General appearance: Well-appearing woman without distress. Head: Normocephalic, without obvious abnormality, atraumatic no oral or shortness source. Neck: no adenopathy Lymph nodes: Cervical, supraclavicular, and axillary nodes normal. Heart:regular rate and rhythm, S1, S2 normal, no murmur, click, rub or gallop Lung:chest clear, no wheezing, rales, normal symmetric air entry Abdomin: soft, non-tender, without masses or organomegaly without rebound or guarding. EXT:no erythema, induration, or nodules  Lab Results: Lab Results  Component Value Date   WBC 4.1 12/30/2016   HGB 13.9 12/30/2016   HCT 42.3 12/30/2016   MCV 92.2 12/30/2016   PLT 198 12/30/2016     Chemistry      Component Value Date/Time   NA 141 12/18/2015 0817   K 3.8 12/18/2015 0817   CL 101  05/19/2014 2238   CL 103 11/19/2012 1005   CO2 25 12/18/2015 0817   BUN 5.7 (L) 12/18/2015 0817   CREATININE 0.8 12/18/2015 0817      Component Value Date/Time   CALCIUM 9.6 12/18/2015 0817   ALKPHOS 52 12/18/2015 0817   AST 22 12/18/2015 0817   ALT 21 12/18/2015 0817   BILITOT 0.51 12/18/2015 0817       Impression and Plan:  51 year old with the following issues:  1. T2N0 rectal cancer diagnosed in 05/2012. She is S/P LAR in 06/2012. She is currently on active surveillance without any recurrent disease.  CT scan and labs from  12/18/2015 showed no evidence of relapse disease.   Physical examination and laboratory testing from today do not indicate relapse. For the time being, no further oncology follow-up is needed.    2. Colonoscopic screen: She is up-to-date at this point and her repeat colonoscopy will be in one year. I emphasized the importance of keeping up with her scheduled colonoscopies.  3. Constipation: Seems to be a chronic problem and manageable with laxatives.   4. Follow-up: Happy to see her in the future as needed.    [facility], MD 5/18/20189:15 AM

## 2016-12-31 LAB — CEA: CEA: 1.2 ng/mL (ref 0.0–4.7)

## 2017-01-27 ENCOUNTER — Ambulatory Visit (INDEPENDENT_AMBULATORY_CARE_PROVIDER_SITE_OTHER): Payer: 59 | Admitting: Pediatrics

## 2017-01-27 ENCOUNTER — Encounter: Payer: Self-pay | Admitting: Pediatrics

## 2017-01-27 VITALS — BP 111/71 | HR 74 | Temp 97.7°F | Ht 66.0 in | Wt 210.0 lb

## 2017-01-27 DIAGNOSIS — G8929 Other chronic pain: Secondary | ICD-10-CM | POA: Diagnosis not present

## 2017-01-27 DIAGNOSIS — S0990XA Unspecified injury of head, initial encounter: Secondary | ICD-10-CM | POA: Diagnosis not present

## 2017-01-27 DIAGNOSIS — M545 Low back pain: Secondary | ICD-10-CM

## 2017-01-27 MED ORDER — OXYCODONE-ACETAMINOPHEN 5-325 MG PO TABS: 1.0000 | ORAL_TABLET | Freq: Two times a day (BID) | ORAL | 0 refills | Status: DC | PRN

## 2017-01-27 NOTE — Progress Notes (Signed)
  Subjective:   Patient ID: [name], female    DOB: Aug 27, 1965, 51 y.o.   MRN: [id number] CC: Back Pain and Head Injury (Ear pain)  HPI: [name] is a 51 y.o. female presenting for Back Pain and Head Injury (Ear pain)  Had back surgery a few years back Was getting injections from Dr Nelva Bush Recently havent been helping as much Has plans to return to Dr. Maxie Better who performed the surgery Taking oxycodone as needed on days that she is very active at work at the end of the day having more back pain Helps her be able to rest, otherwise back pain is very limiting in getting comfortable at night Has a lot of pain rolling over and getting out of bed All located in her lower spine No new pain symptoms No trouble emptying her bladder, no fevers No weakness or altered sensation LE  Yesterday turned into an upright metal pole at work hitting the R side of her forehead/eye Didn't fall, no LOC Did see stars, had to stand still for a coupl eminutes before moving because of pain No nausea/vomiting Side of face is sore Slight swelling she noticed R eye this morning No pain with moving eyes No headache now  Relevant past medical, surgical, family and social history reviewed. Allergies and medications reviewed and updated. History  Smoking Status  . Never Smoker  Smokeless Tobacco  . Never Used   ROS: Per HPI   Objective:    BP 111/71   Pulse 74   Temp 97.7 F (36.5 C) (Oral)   Ht 5\' 6"  (1.676 m)   Wt 210 lb (95.3 kg)   BMI 33.89 kg/m   Wt Readings from Last 3 Encounters:  01/27/17 210 lb (95.3 kg)  12/30/16 210 lb 4.8 oz (95.4 kg)  11/10/16 212 lb (96.2 kg)    Gen: NAD, alert, cooperative with exam, NCAT EYES: EOMI, no pain with EOM, no conjunctival injection, or no icterus ENT:  TMs pearly gray b/l, OP without erythema LYMPH: no cervical LAD CV: NRRR, normal S1/S2, no murmur, distal pulses 2+ b/l Resp: CTABL, no wheezes, normal WOB Ext: No edema,  warm Neuro: Alert and oriented, coordination grossly normal, patellar reflexes 1+ b/l MSK: ttp over lower spine ttp over lateral R eye brow, slight swelling over R eye compared with L No bruising  Assessment & Plan:  [name] was seen today for back pain and head injury.  Diagnoses and all orders for this visit:  Injury of head, initial encounter No nausea or vomiting Discussed return precautions Avoid any activities that cause HA next few days  Chronic midline low back pain, with sciatica presence unspecified Taking pain medication as needed, not every day, on highly active days at work when back pain increases Improves mobility at those times Needs to be seen for further Rx NCCSRS reviewed and appropraite Previously receiving pain medication from orthopedics -     oxyCODONE-acetaminophen (PERCOCET/ROXICET) 5-325 MG tablet; Take 1 tablet by mouth every 12 (twelve) hours as needed for severe pain.   Follow up plan: 2 weeks to discuss pain management Assunta Found, MD [place]

## 2017-02-10 ENCOUNTER — Ambulatory Visit: Payer: 59 | Admitting: Pediatrics

## 2017-02-18 DIAGNOSIS — M5136 Other intervertebral disc degeneration, lumbar region: Secondary | ICD-10-CM | POA: Diagnosis not present

## 2017-02-18 DIAGNOSIS — M961 Postlaminectomy syndrome, not elsewhere classified: Secondary | ICD-10-CM | POA: Diagnosis not present

## 2017-02-18 DIAGNOSIS — M47816 Spondylosis without myelopathy or radiculopathy, lumbar region: Secondary | ICD-10-CM | POA: Diagnosis not present

## 2017-03-24 DIAGNOSIS — M47816 Spondylosis without myelopathy or radiculopathy, lumbar region: Secondary | ICD-10-CM | POA: Diagnosis not present

## 2017-03-24 DIAGNOSIS — M5136 Other intervertebral disc degeneration, lumbar region: Secondary | ICD-10-CM | POA: Diagnosis not present

## 2017-03-26 ENCOUNTER — Other Ambulatory Visit: Payer: Self-pay | Admitting: Nurse Practitioner

## 2017-04-27 ENCOUNTER — Ambulatory Visit (INDEPENDENT_AMBULATORY_CARE_PROVIDER_SITE_OTHER): Payer: 59 | Admitting: Family Medicine

## 2017-04-27 ENCOUNTER — Encounter: Payer: Self-pay | Admitting: Family Medicine

## 2017-04-27 VITALS — BP 129/76 | HR 79 | Temp 97.8°F | Ht 66.0 in | Wt 211.0 lb

## 2017-04-27 DIAGNOSIS — K219 Gastro-esophageal reflux disease without esophagitis: Secondary | ICD-10-CM

## 2017-04-27 DIAGNOSIS — K5900 Constipation, unspecified: Secondary | ICD-10-CM | POA: Diagnosis not present

## 2017-04-27 DIAGNOSIS — G479 Sleep disorder, unspecified: Secondary | ICD-10-CM | POA: Diagnosis not present

## 2017-04-27 MED ORDER — LINACLOTIDE 290 MCG PO CAPS: 290.0000 ug | ORAL_CAPSULE | Freq: Every day | ORAL | 5 refills | Status: DC

## 2017-04-27 MED ORDER — DOXEPIN HCL 10 MG PO CAPS: 10.0000 mg | ORAL_CAPSULE | Freq: Every day | ORAL | 5 refills | Status: DC

## 2017-04-27 MED ORDER — ESOMEPRAZOLE MAGNESIUM 40 MG PO CPDR: 40.0000 mg | DELAYED_RELEASE_CAPSULE | Freq: Every day | ORAL | 3 refills | Status: DC

## 2017-04-27 NOTE — Patient Instructions (Signed)
Great to see you!  I am treating you for heartburn, I am checking you for an H pylori infection ( which can lead to GERD)

## 2017-04-27 NOTE — Progress Notes (Signed)
   HPI  Patient presents today here with severe heartburn.  Patient states that she's been having acid reflux, persistent belching, over the last 2 months. Seems to be getting worse and she has nearly continuous severe heartburn now. She's tried over-the-counter Tums and Pepto-Bismol without good improvement. She's tolerating food and fluids like usual.   She has a history of rectal cancer, she needs a refill of linzess He works well.  Patient had difficulty sleeping, she gets good relief with 12 mg doxepin , needs refill    PMH: Smoking status noted ROS: Per HPI  Objective: BP 129/76   Pulse 79   Temp 97.8 F (36.6 C) (Oral)   Ht 5\' 6"  (1.676 m)   Wt 211 lb (95.7 kg)   BMI 34.06 kg/m  Gen: NAD, alert, cooperative with exam HEENT: NCAT CV: RRR, good S1/S2, no murmur Resp: CTABL, no wheezes, non-labored Abd: SNTND, BS present, no guarding or organomegaly, Persistent belching throughout the exam  Ext: No edema, warm Neuro: Alert and oriented, No gross deficits  Assessment and plan:  # GERD Severe symptoms, checking H. pylori labs Treat with Nexium Discussed reasons to see GI, she will likely need to see GI with severe symptoms  # Constipation Doing well, refill medication- linzess  # Difficulty sleeping Doing well at 12 mg doxepin, she would like a single dose treatment, 10 mg pills sent.    Orders Placed This Encounter  Procedures  . H Pylori, IGM, IGG, IGA AB    Meds ordered this encounter  Medications  . doxepin (SINEQUAN) 10 MG capsule    Sig: Take 1 capsule (10 mg total) by mouth at bedtime.    Dispense:  30 capsule    Refill:  5  . linaclotide (LINZESS) 290 MCG CAPS capsule    Sig: Take 1 capsule (290 mcg total) by mouth daily.    Dispense:  30 capsule    Refill:  5  . esomeprazole (NEXIUM) 40 MG capsule    Sig: Take 1 capsule (40 mg total) by mouth daily.    Dispense:  30 capsule    Refill:  [place], MD [place] 04/27/2017, 5:09 PM

## 2017-04-28 LAB — H PYLORI, IGM, IGG, IGA AB

## 2017-05-03 ENCOUNTER — Encounter: Payer: Self-pay | Admitting: Gastroenterology

## 2017-05-03 ENCOUNTER — Ambulatory Visit (INDEPENDENT_AMBULATORY_CARE_PROVIDER_SITE_OTHER): Payer: 59 | Admitting: Gastroenterology

## 2017-05-03 DIAGNOSIS — K219 Gastro-esophageal reflux disease without esophagitis: Secondary | ICD-10-CM | POA: Insufficient documentation

## 2017-05-03 NOTE — Progress Notes (Signed)
Referring Provider: Chevis Pretty, * Primary Care Physician:  Chevis Pretty, FNP Primary GI: Dr. Oneida Alar   Chief Complaint  Patient presents with  . Gastroesophageal Reflux    HPI:   [name] is a 51 y.o. female presenting today with a history of rectal cancer diagnosed in 2013, s/p LAR Nov 2013, with last colonoscopy in Oct 2014 with normal TI, mild diverticulosis, normal anastomosis. CEA remains normal at 1.2, last completed several months ago. CBC normal in May 2018. Colonoscopy due 2019.   Linzess 290 mcg works well but only uses prn. Tries to time when to take it. No rectal bleeding. No abdominal pain.   Notes issues with GERD, flaring up several months ago. Has gotten worse lately. With eating and drinking, would have belching, hiccups, horrible reflux. Tried several things OTC. Mom had bought pepto bismol tablets to try but was popping them constantly and stool turned black. Last Thursday started on Nexium to start once a day. H.pylori serology negative. Nexium seems to be helping some but last night and today had a bout with hiccups and belching but not as bad as before. States she has a sensation of solid food "hanging" up occasionally. Sometimes feels like pressure building up esophagus when trying to push while sitting on toilet; she is not sure how to describe this. No appetite changes. Even water will set this off. Taking Nexium first thing in the morning on an empty stomach. Drinks soft drinks, has a small cup of coffee daily. Notices some things that set it off more. Avoids NSAIDs.   Past Medical History:  Diagnosis Date  . Anxiety    NEW-DUE TO ANXIETY OVER DX OF CANCER  . Blood transfusion 1995  . Cancer (HCC)    RECTAL CANCER  . Constipation    SOME BLOOD IN STOOL  . GERD (gastroesophageal reflux disease)    occasionally-NO MEDS  . Kidney stone   . PONV (postoperative nausea and vomiting)    used a scop patch last surgery-was better     Past Surgical History:  Procedure Laterality Date  . ABDOMINAL HYSTERECTOMY  1995   partial  . BACK SURGERY  2015  . COLON SURGERY    . COLONOSCOPY  06/01/2012   Procedure: COLONOSCOPY;  Surgeon: Danie Binder, MD;  Location: [facility];  Service: Endoscopy;  Laterality: N/A;  1:30PM  . COLONOSCOPY N/A 06/14/2013   ZOX:WRUE diverticulosis/normal anastomosis  . endoscopic left fallopian tube removed  several yrs ago  . EUS  06/06/2012   Procedure: LOWER ENDOSCOPIC ULTRASOUND (EUS);  Surgeon: Arta Silence, MD;  Location: [facility];  Service: Endoscopy;  Laterality: N/A;  . EXAMINATION UNDER ANESTHESIA  09/07/2012   Procedure: EXAM UNDER ANESTHESIA;  Surgeon: Stark Klein, MD;  Location: [facility];  Service: General;  Laterality: N/A;  . EXCISION/RELEASE BURSA HIP  09/15/2011   Dr Tonita Cong EXCISION/RELEASE BURSA HIP;  Surgeon: Johnn Hai, MD;  Location: [facility];  Service: Orthopedics;  Laterality: Left;  Excision of Trochanteric Bursitis  . FLEXIBLE SIGMOIDOSCOPY N/A 03/22/2013   Procedure: FLEXIBLE SIGMOIDOSCOPY;  Surgeon: Danie Binder, MD;  Location: [facility];  Service: Endoscopy;  Laterality: N/A;  2:00  . LAPAROSCOPIC LOW ANTERIOR RESECTION  07/02/2012   Procedure: LAPAROSCOPIC LOW ANTERIOR RESECTION;  Surgeon: Stark Klein, MD;  Location: [facility];  Service: General;  Laterality: N/A;  . left ankle surgery for fx  several yrs ago  . LUMBAR LAMINECTOMY/DECOMPRESSION MICRODISCECTOMY N/A 05/14/2014  Procedure: LUMBAR DECOMPRESSION L2-L3;  Surgeon: Johnn Hai, MD;  Location: [facility];  Service: Orthopedics;  Laterality: N/A;  . PROCTOSCOPY  09/07/2012   Procedure: PROCTOSCOPY;  Surgeon: Stark Klein, MD;  Location: [facility];  Service: General;  Laterality: N/A;    Current Outpatient Prescriptions  Medication Sig Dispense Refill  . doxepin (SINEQUAN) 10 MG capsule Take 1 capsule (10 mg total) by mouth at bedtime. 30 capsule 5  .  esomeprazole (NEXIUM) 40 MG capsule Take 1 capsule (40 mg total) by mouth daily. 30 capsule 3  . estradiol (ESTRACE) 1 MG tablet TAKE 1 TABLET TWICE A DAY 60 tablet 4  . linaclotide (LINZESS) 290 MCG CAPS capsule Take 1 capsule (290 mcg total) by mouth daily. 30 capsule 5  . oxyCODONE-acetaminophen (PERCOCET/ROXICET) 5-325 MG tablet Take 1 tablet by mouth every 12 (twelve) hours as needed for severe pain. 10 tablet 0  . SILENOR 6 MG TABS TAKE 1 TABLET (6 MG TOTAL) BY MOUTH NIGHTLY. (Patient not taking: Reported on 05/03/2017) 30 tablet 0   No current facility-administered medications for this visit.     Allergies as of 05/03/2017 - Review Complete 05/03/2017  Allergen Reaction Noted  . Codeine Nausea Only 12/15/2010  . Ultram [tramadol] Nausea And Vomiting 05/25/2012  . Latex Itching and Rash 07/03/2012    Family History  Problem Relation Age of Onset  . Other Sister   . Colon cancer Paternal Aunt     Social History   Social History  . Marital status: Single    Spouse name: N/A  . Number of children: 2  . Years of education: N/A   Occupational History  . primary operator [employer] History Main Topics  . Smoking status: Never Smoker  . Smokeless tobacco: Never Used  . Alcohol use Yes     Comment: occasional couple times per mo  . Drug use: No  . Sexual activity: Yes    Birth control/ protection: Surgical   Other Topics Concern  . None   Social History Narrative   Lives alone & 2 kids          Review of Systems: Gen: Denies fever, chills, anorexia. Denies fatigue, weakness, weight loss.  CV: Denies chest pain, palpitations, syncope, peripheral edema, and claudication. Resp: Denies dyspnea at rest, cough, wheezing, coughing up blood, and pleurisy. GI: see HPI  Derm: Denies rash, itching, dry skin Psych: Denies depression, anxiety, memory loss, confusion. No homicidal or suicidal ideation.  Heme: Denies bruising, bleeding, and enlarged lymph  nodes.  Physical Exam: BP 130/82   Pulse 83   Temp 98.3 F (36.8 C) (Oral)   Ht 5\' 6"  (1.676 m)   Wt 209 lb 12.8 oz (95.2 kg)   BMI 33.86 kg/m  General:   Alert and oriented. No distress noted. Pleasant and cooperative.  Head:  Normocephalic and atraumatic. Eyes:  Conjuctiva clear without scleral icterus. Mouth:  Oral mucosa pink and moist.  Abdomen:  +BS, soft, non-tender and non-distended. No rebound or guarding. No HSM or masses noted. Msk:  Symmetrical without gross deformities. Normal posture. Extremities:  Without edema. Neurologic:  Alert and  oriented x4 Psych:  Alert and cooperative. Normal mood and affect.

## 2017-05-03 NOTE — Patient Instructions (Addendum)
We don't have Nexium samples, but I have a coupon for the over-the-counter Nexium. Take one of these each evening, 30 minutes before dinner. Keep taking the Nexium each morning for now.   I want you to call or email in 1 week with an update. If your symptoms are not resolved, we will proceed with an upper endoscopy with Dr. Oneida Alar.  I have attached a reflux diet sheet. We will see you in 3 months regardless!   Food Choices for Gastroesophageal Reflux Disease, Adult When you have gastroesophageal reflux disease (GERD), the foods you eat and your eating habits are very important. Choosing the right foods can help ease your discomfort. What guidelines do I need to follow?  Choose fruits, vegetables, whole grains, and low-fat dairy products.  Choose low-fat meat, fish, and poultry.  Limit fats such as oils, salad dressings, butter, nuts, and avocado.  Keep a food diary. This helps you identify foods that cause symptoms.  Avoid foods that cause symptoms. These may be different for everyone.  Eat small meals often instead of 3 large meals a day.  Eat your meals slowly, in a place where you are relaxed.  Limit fried foods.  Cook foods using methods other than frying.  Avoid drinking alcohol.  Avoid drinking large amounts of liquids with your meals.  Avoid bending over or lying down until 2-3 hours after eating. What foods are not recommended? These are some foods and drinks that may make your symptoms worse: Vegetables Tomatoes. Tomato juice. Tomato and spaghetti sauce. Chili peppers. Onion and garlic. Horseradish. Fruits Oranges, grapefruit, and lemon (fruit and juice). Meats High-fat meats, fish, and poultry. This includes hot dogs, ribs, ham, sausage, salami, and bacon. Dairy Whole milk and chocolate milk. Sour cream. Cream. Butter. Ice cream. Cream cheese. Drinks Coffee and tea. Bubbly (carbonated) drinks or energy drinks. Condiments Hot sauce. Barbecue  sauce. Sweets/Desserts Chocolate and cocoa. Donuts. Peppermint and spearmint. Fats and Oils High-fat foods. This includes [place] fries and potato chips. Other Vinegar. Strong spices. This includes black pepper, white pepper, red pepper, cayenne, curry powder, cloves, ginger, and chili powder. The items listed above may not be a complete list of foods and drinks to avoid. Contact your dietitian for more information. This information is not intended to replace advice given to you by your health care provider. Make sure you discuss any questions you have with your health care provider. Document Released: 01/31/2012 Document Revised: 01/07/2016 Document Reviewed: 06/05/2013 Elsevier Interactive Patient Education  2017 [employer].

## 2017-05-07 NOTE — Assessment & Plan Note (Signed)
51 year old female with recent GERD exacerbation, just starting on Nexium one week ago. Some mild improvement noted. Discussed may take an additional week to achieve full effect. Noting some sensation of food "hanging" occasionally with bout of severe GERD. H.pylori serology recently negative. Increase Nexium to BID to attempt at providing symptomatic relief, with progress report in 1 week. I discussed we would pursue an EGD with possible dilatation if she does not notice significant improvement in the next week, as she recently just started on PPI therapy. GERD diet provided as well. Patient to call in 1 week with an update.  Surveillance colonoscopy due to history of rectal cancer in 2013 is due in 2019. Return in 3 months regardless.

## 2017-05-08 NOTE — Progress Notes (Signed)
CC'D TO PCP °

## 2017-05-15 ENCOUNTER — Telehealth: Payer: Self-pay | Admitting: Nurse Practitioner

## 2017-05-15 NOTE — Telephone Encounter (Signed)
Appt made with [facility] for tomorrow 10/2

## 2017-05-16 ENCOUNTER — Ambulatory Visit: Payer: 59 | Admitting: Family

## 2017-05-19 ENCOUNTER — Encounter: Payer: Self-pay | Admitting: Family

## 2017-05-19 ENCOUNTER — Ambulatory Visit (INDEPENDENT_AMBULATORY_CARE_PROVIDER_SITE_OTHER): Payer: 59 | Admitting: Family

## 2017-05-19 VITALS — BP 123/80 | HR 81 | Temp 97.7°F | Ht 66.0 in | Wt 211.0 lb

## 2017-05-19 DIAGNOSIS — L732 Hidradenitis suppurativa: Secondary | ICD-10-CM | POA: Diagnosis not present

## 2017-05-19 DIAGNOSIS — L0291 Cutaneous abscess, unspecified: Secondary | ICD-10-CM

## 2017-05-19 MED ORDER — SULFAMETHOXAZOLE-TRIMETHOPRIM 800-160 MG PO TABS: 1.0000 | ORAL_TABLET | Freq: Two times a day (BID) | ORAL | 0 refills | Status: DC

## 2017-05-19 MED ORDER — FLUCONAZOLE 150 MG PO TABS: 150.0000 mg | ORAL_TABLET | ORAL | 0 refills | Status: DC | PRN

## 2017-05-19 NOTE — Progress Notes (Signed)
   Subjective:    Patient ID: [name], female    DOB: 06-24-1966, 51 y.o.   MRN: [id number]  HPI Pt presents to the office today for an abscess on her perineum that she noticed about two weeks ago. PT states it has improved and has used hot compresses.   Pt states the area was very hard, but has become soft. PT states she is having intermittent aching pain of 3 out 10.     Review of Systems  Skin: Positive for wound.  All other systems reviewed and are negative.      Objective:   Physical Exam  Constitutional: She is oriented to person, place, and time. She appears well-developed and well-nourished. No distress.  HENT:  Head: Normocephalic.  Cardiovascular: Normal rate, regular rhythm, normal heart sounds and intact distal pulses.   No murmur heard. Pulmonary/Chest: Effort normal and breath sounds normal. No respiratory distress. She has no wheezes.  Abdominal: Soft. Bowel sounds are normal. She exhibits no distension. There is no tenderness.  Genitourinary:     Musculoskeletal: Normal range of motion. She exhibits no edema or tenderness.  Neurological: She is alert and oriented to person, place, and time.  Skin: Skin is warm and dry.  Small hard Abscess on left inner gluteal   Psychiatric: She has a normal mood and affect. Her behavior is normal. Judgment and thought content normal.  Vitals reviewed.    BP 123/80   Pulse 81   Temp 97.7 F (36.5 C) (Oral)   Ht 5\' 6"  (1.676 m)   Wt 211 lb (95.7 kg)   BMI 34.06 kg/m      Assessment & Plan:  1. Abscess - sulfamethoxazole-trimethoprim (BACTRIM DS) 800-160 MG tablet; Take 1 tablet by mouth 2 (two) times daily.  Dispense: 14 tablet; Refill: 0  2. Hidradenitis suppurativa   Warm compresses Do not squeeze  Start daily chlorhexidine wash RTO prn or if area does not improve or worsens   Evelina Dun, FNP

## 2017-05-19 NOTE — Patient Instructions (Signed)
Hidradenitis Suppurativa Hidradenitis suppurativa is a long-term (chronic) skin disease that starts with blocked sweat glands or hair follicles. Bacteria may grow in these blocked openings of your skin. Hidradenitis suppurativa is like a severe form of acne that develops in areas of your body where acne would be unusual. It is most likely to affect the areas of your body where skin rubs against skin and becomes moist. This includes your:  Underarms.  Groin.  Genital areas.  Buttocks.  Upper thighs.  Breasts.  Hidradenitis suppurativa may start out with small pimples. The pimples can develop into deep sores that break open (rupture) and drain pus. Over time your skin may thicken and become scarred. Hidradenitis suppurativa cannot be passed from person to person. What are the causes? The exact cause of hidradenitis suppurativa is not known. This condition may be due to:  Female and female hormones. The condition is rare before and after puberty.  An overactive body defense system (immune system). Your immune system may overreact to the blocked hair follicles or sweat glands and cause swelling and pus-filled sores.  What increases the risk? You may have a higher risk of hidradenitis suppurativa if you:  Are a woman.  Are between ages 11 and 55.  Have a family history of hidradenitis suppurativa.  Have a personal history of acne.  Are overweight.  Smoke.  Take the drug lithium.  What are the signs or symptoms? The first signs of an outbreak are usually painful skin bumps that look like pimples. As the condition progresses:  Skin bumps may get bigger and grow deeper into the skin.  Bumps under the skin may rupture and drain smelly pus.  Skin may become itchy and infected.  Skin may thicken and scar.  Drainage may continue through tunnels under the skin (fistulas).  Walking and moving your arms can become painful.  How is this diagnosed? Your health care provider may  diagnose hidradenitis suppurativa based on your medical history and your signs and symptoms. A physical exam will also be done. You may need to see a health care provider who specializes in skin diseases (dermatologist). You may also have tests done to confirm the diagnosis. These can include:  Swabbing a sample of pus or drainage from your skin so it can be sent to the lab and tested for infection.  Blood tests to check for infection.  How is this treated? The same treatment will not work for everybody with hidradenitis suppurativa. Your treatment will depend on how severe your symptoms are. You may need to try several treatments to find what works best for you. Part of your treatment may include cleaning and bandaging (dressing) your wounds. You may also have to take medicines, such as the following:  Antibiotics.  Acne medicines.  Medicines to block or suppress the immune system.  A diabetes medicine (metformin) is sometimes used to treat this condition.  For women, birth control pills can sometimes help relieve symptoms.  You may need surgery if you have a severe case of hidradenitis suppurativa that does not respond to medicine. Surgery may involve:  Using a laser to clear the skin and remove hair follicles.  Opening and draining deep sores.  Removing the areas of skin that are diseased and scarred.  Follow these instructions at home:  Learn as much as you can about your disease, and work closely with your health care providers.  Take medicines only as directed by your health care provider.  If you were prescribed   an antibiotic medicine, finish it all even if you start to feel better.  If you are overweight, losing weight may be very helpful. Try to reach and maintain a healthy weight.  Do not use any tobacco products, including cigarettes, chewing tobacco, or electronic cigarettes. If you need help quitting, ask your health care provider.  Do not shave the areas where you  get hidradenitis suppurativa.  Do not wear deodorant.  Wear loose-fitting clothes.  Try not to overheat and get sweaty.  Take a daily bleach bath as directed by your health care provider. ? Fill your bathtub halfway with water. ? Pour in  cup of unscented household bleach. ? Soak for 5-10 minutes.  Cover sore areas with a warm, clean washcloth (compress) for 5-10 minutes. Contact a health care provider if:  You have a flare-up of hidradenitis suppurativa.  You have chills or a fever.  You are having trouble controlling your symptoms at home. This information is not intended to replace advice given to you by your health care provider. Make sure you discuss any questions you have with your health care provider. Document Released: 03/15/2004 Document Revised: 01/07/2016 Document Reviewed: 11/01/2013 Elsevier Interactive Patient Education  2018 [employer]  

## 2017-06-01 ENCOUNTER — Ambulatory Visit (INDEPENDENT_AMBULATORY_CARE_PROVIDER_SITE_OTHER): Payer: 59 | Admitting: Family

## 2017-06-01 ENCOUNTER — Encounter: Payer: Self-pay | Admitting: Family

## 2017-06-01 VITALS — BP 108/69 | HR 70 | Temp 97.0°F | Ht 66.0 in | Wt 212.0 lb

## 2017-06-01 DIAGNOSIS — L720 Epidermal cyst: Secondary | ICD-10-CM

## 2017-06-01 NOTE — Patient Instructions (Signed)
Epidermal Cyst An epidermal cyst is sometimes called an epidermal inclusion cyst or an infundibular cyst. It is a sac made of skin tissue. The sac contains a substance called keratin. Keratin is a protein that is normally secreted through the hair follicles. When keratin becomes trapped in the top layer of skin (epidermis), it can form an epidermal cyst. Epidermal cysts are usually found on the face, neck, trunk, and genitals. These cysts are usually harmless (benign), and they may not cause symptoms unless they become infected. It is important not to pop epidermal cysts yourself. What are the causes? This condition may be caused by:  A blocked hair follicle.  A hair that curls and re-enters the skin instead of growing straight out of the skin (ingrown hair).  A blocked pore.  Irritated skin.  An injury to the skin.  Certain conditions that are passed along from parent to child (inherited).  Human papillomavirus (HPV).  What increases the risk? The following factors may make you more likely to develop an epidermal cyst:  Having acne.  Being overweight.  Wearing tight clothing.  What are the signs or symptoms? The only symptom of this condition may be a small, painless lump underneath the skin. When an epidermal cyst becomes infected, symptoms may include:  Redness.  Inflammation.  Tenderness.  Warmth.  Fever.  Keratin draining from the cyst. Keratin may look like a grayish-white, bad-smelling substance.  Pus draining from the cyst.  How is this diagnosed? This condition is diagnosed with a physical exam. In some cases, you may have a sample of tissue (biopsy) taken from your cyst to be examined under a microscope or tested for bacteria. You may be referred to a health care provider who specializes in skin care (dermatologist). How is this treated? In many cases, epidermal cysts go away on their own without treatment. If a cyst becomes infected, treatment may  include:  Opening and draining the cyst. After draining, minor surgery to remove the rest of the cyst may be done.  Antibiotic medicine to help prevent infection.  Injections of medicines (steroids) that help to reduce inflammation.  Surgery to remove the cyst. Surgery may be done if: ? The cyst becomes large. ? The cyst bothers you. ? There is a chance that the cyst could turn into cancer.  Follow these instructions at home:  Take over-the-counter and prescription medicines only as told by your health care provider.  If you were prescribed an antibiotic, use it as told by your health care provider. Do not stop using the antibiotic even if you start to feel better.  Keep the area around your cyst clean and dry.  Wear loose, dry clothing.  Do not try to pop your cyst.  Avoid touching your cyst.  Check your cyst every day for signs of infection.  Keep all follow-up visits as told by your health care provider. This is important. How is this prevented?  Wear clean, dry, clothing.  Avoid wearing tight clothing.  Keep your skin clean and dry. Shower or take baths every day.  Wash your body with a benzoyl peroxide wash when you shower or bathe. Contact a health care provider if:  Your cyst develops symptoms of infection.  Your condition is not improving or is getting worse.  You develop a cyst that looks different from other cysts you have had.  You have a fever. Get help right away if:  Redness spreads from the cyst into the surrounding area. This information is   not intended to replace advice given to you by your health care provider. Make sure you discuss any questions you have with your health care provider. Document Released: 07/02/2004 Document Revised: 03/30/2016 Document Reviewed: 06/03/2015 Elsevier Interactive Patient Education  2018 [employer] Inc[employer]  

## 2017-06-01 NOTE — Progress Notes (Signed)
   Subjective:    Patient ID: [name], female    DOB: 1966/04/13, 51 y.o.   MRN: [id number]  HPI Pt presents to the office today to recheck abscess. Pt was seen on 05/19/17 and started on Bactrim. Pt states the abscess that has become smaller, but has "never came to a head".   Pt states the area will become hard and then  Soft, get big and then small.    Review of Systems  Skin: Positive for wound.  All other systems reviewed and are negative.      Objective:   Physical Exam  Constitutional: She is oriented to person, place, and time. She appears well-developed and well-nourished. No distress.  HENT:  Head: Normocephalic.  Cardiovascular: Normal rate, regular rhythm, normal heart sounds and intact distal pulses.   No murmur heard. Pulmonary/Chest: Effort normal and breath sounds normal. No respiratory distress. She has no wheezes.  Abdominal: Soft. Bowel sounds are normal. She exhibits no distension. There is no tenderness.  Genitourinary:     Musculoskeletal: Normal range of motion. She exhibits no edema or tenderness.  Neurological: She is alert and oriented to person, place, and time.  Skin: Skin is warm and dry.  Psychiatric: She has a normal mood and affect. Her behavior is normal. Judgment and thought content normal.  Vitals reviewed.  Area clean in left inner thigh, Local anesthesia Lidocaine 2% with 59ml Betadine prep Small incision  Made Cyst removed in pieces  No purulent discharge present   Cleaned with Saline Dermabond applied  Antibiotic ointment Dressing applied    BP 108/69   Pulse 70   Temp (!) 97 F (36.1 C) (Oral)   Ht 5\' 6"  (1.676 m)   Wt 212 lb (96.2 kg)   BMI 34.22 kg/m      Assessment & Plan:  1. Epidermal cyst Keep clean and dry Report any s/s of infection  Complete antibiotic  RTO prn   Evelina Dun, FNP

## 2017-06-27 ENCOUNTER — Ambulatory Visit: Payer: 59 | Admitting: Family Medicine

## 2017-06-27 ENCOUNTER — Encounter: Payer: Self-pay | Admitting: Family Medicine

## 2017-06-27 VITALS — BP 126/78 | HR 58 | Temp 97.1°F | Ht 66.0 in | Wt 212.0 lb

## 2017-06-27 DIAGNOSIS — G43901 Migraine, unspecified, not intractable, with status migrainosus: Secondary | ICD-10-CM

## 2017-06-27 MED ORDER — PROMETHAZINE HCL 25 MG/ML IJ SOLN: 25.0000 mg | Freq: Once | INTRAMUSCULAR | Status: DC

## 2017-06-27 MED ORDER — KETOROLAC TROMETHAMINE 60 MG/2ML IM SOLN
60.0000 mg | Freq: Once | INTRAMUSCULAR | Status: AC
  Administered 2017-06-27: 60 mg via INTRAMUSCULAR

## 2017-06-27 MED ORDER — ONDANSETRON 4 MG PO TBDP: 4.0000 mg | ORAL_TABLET | Freq: Three times a day (TID) | ORAL | 0 refills | Status: DC | PRN

## 2017-06-27 MED ORDER — PROMETHAZINE HCL 25 MG/ML IJ SOLN
25.0000 mg | Freq: Once | INTRAMUSCULAR | Status: AC
  Administered 2017-06-27: 25 mg via INTRAMUSCULAR

## 2017-06-27 MED ORDER — SUMATRIPTAN SUCCINATE 50 MG PO TABS: 50.0000 mg | ORAL_TABLET | Freq: Once | ORAL | 0 refills | Status: DC

## 2017-06-27 NOTE — Patient Instructions (Signed)
I think that you have developed new onset migraine headaches.  Your neurologic exam today was unremarkable.  Differential diagnosis that we discussed was possible stroke given your visual symptoms.  Because these have resolved, I doubt that this is the reason why you had spotty vision.  Migraine headaches are often accompanied by something called a "aura".  See below for details.  During today's visit, you were treated with Toradol and promethazine for nausea and headache.  I have prescribed you Zofran to dissolve under your tongue as needed for nausea.  Though I suspect that your nausea will resolve once your headache has resolved.  I have also prescribed you Imitrex 50 mg.  If you develop a headache that is not responsive to the ibuprofen you have been taking,  You may take 1 tablet of the Imitrex.  If your migraine headache persists after 2 hours, you may repeat this dose one time.  I have also placed a referral to neurology for further evaluation and management of your new onset migraine headaches.  Migraine Headache A migraine headache is an intense, throbbing pain on one side or both sides of the head. Migraines may also cause other symptoms, such as nausea, vomiting, and sensitivity to light and noise. What are the causes? Doing or taking certain things may also trigger migraines, such as:  Alcohol.  Smoking.  Medicines, such as: ? Medicine used to treat chest pain (nitroglycerine). ? Birth control pills. ? Estrogen pills. ? Certain blood pressure medicines.  Aged cheeses, chocolate, or caffeine.  Foods or drinks that contain nitrates, glutamate, aspartame, or tyramine.  Physical activity.  Other things that may trigger a migraine include:  Menstruation.  Pregnancy.  Hunger.  Stress, lack of sleep, too much sleep, or fatigue.  Weather changes.  What increases the risk? The following factors may make you more likely to experience migraine headaches:  Age. Risk increases  with age.  Family history of migraine headaches.  Being Caucasian.  Depression and anxiety.  Obesity.  Being a woman.  Having a hole in the heart (patent foramen ovale) or other heart problems.  What are the signs or symptoms? The main symptom of this condition is pulsating or throbbing pain. Pain may:  Happen in any area of the head, such as on one side or both sides.  Interfere with daily activities.  Get worse with physical activity.  Get worse with exposure to bright lights or loud noises.  Other symptoms may include:  Nausea.  Vomiting.  Dizziness.  General sensitivity to bright lights, loud noises, or smells.  Before you get a migraine, you may get warning signs that a migraine is developing (aura). An aura may include:  Seeing flashing lights or having blind spots.  Seeing bright spots, halos, or zigzag lines.  Having tunnel vision or blurred vision.  Having numbness or a tingling feeling.  Having trouble talking.  Having muscle weakness.  How is this diagnosed? A migraine headache can be diagnosed based on:  Your symptoms.  A physical exam.  Tests, such as CT scan or MRI of the head. These imaging tests can help rule out other causes of headaches.  Taking fluid from the spine (lumbar puncture) and analyzing it (cerebrospinal fluid analysis, or CSF analysis).  How is this treated? A migraine headache is usually treated with medicines that:  Relieve pain.  Relieve nausea.  Prevent migraines from coming back.  Treatment may also include:  Acupuncture.  Lifestyle changes like avoiding foods that trigger  migraines.  Follow these instructions at home: Medicines  Take over-the-counter and prescription medicines only as told by your health care provider.  Do not drive or use heavy machinery while taking prescription pain medicine.  To prevent or treat constipation while you are taking prescription pain medicine, your health care  provider may recommend that you: ? Drink enough fluid to keep your urine clear or pale yellow. ? Take over-the-counter or prescription medicines. ? Eat foods that are high in fiber, such as fresh fruits and vegetables, whole grains, and beans. ? Limit foods that are high in fat and processed sugars, such as fried and sweet foods. Lifestyle  Avoid alcohol use.  Do not use any products that contain nicotine or tobacco, such as cigarettes and e-cigarettes. If you need help quitting, ask your health care provider.  Get at least 8 hours of sleep every night.  Limit your stress. General instructions   Keep a journal to find out what may trigger your migraine headaches. For example, write down: ? What you eat and drink. ? How much sleep you get. ? Any change to your diet or medicines.  If you have a migraine: ? Avoid things that make your symptoms worse, such as bright lights. ? It may help to lie down in a dark, quiet room. ? Do not drive or use heavy machinery. ? Ask your health care provider what activities are safe for you while you are experiencing symptoms.  Keep all follow-up visits as told by your health care provider. This is important. Contact a health care provider if:  You develop symptoms that are different or more severe than your usual migraine symptoms. Get help right away if:  Your migraine becomes severe.  You have a fever.  You have a stiff neck.  You have vision loss.  Your muscles feel weak or like you cannot control them.  You start to lose your balance often.  You develop trouble walking.  You faint. This information is not intended to replace advice given to you by your health care provider. Make sure you discuss any questions you have with your health care provider. Document Released: 08/01/2005 Document Revised: 02/19/2016 Document Reviewed: 01/18/2016 Elsevier Interactive Patient Education  2017 [employer].

## 2017-06-27 NOTE — Progress Notes (Signed)
Subjective: CC: nausea/ headache PCP: Chevis Pretty, FNP PPJ:KDTO [name] is a 51 y.o. female presenting to clinic today for:  1. Nausea/ headache Patient reports that she developed a bitemporal headache 2 weeks ago.  She notes that the headache is been intermittent.  She reports associated photophobia, phonophobia, dizziness.  She does note that yesterday she felt like she was seeing spots when she turned her head to the right side.  She does note that she was having a headache at that time.  She also reports feeling off balance.  Denies weakness, numbness, tingling, speech abnormality, difficulty swallowing, changes in hearing.  No chest pain, shortness of breath, heart palpitations.  No recent travel.  She has been taking Motrin 800 mg twice daily and a BC powder which have helped the headache but it continues to return.  She reports that this morning she woke up with severe headache that is causing nausea.  She denies vomiting, neck pain, fevers, chills.    Family history significant for migraine headaches in her mother, who brought her to today's appointment for concerns regarding possible onset of migraine headaches.  Allergies  Allergen Reactions  . Codeine Nausea Only    Confirmed intolerance of codeine verbally with pt in PACU, pt states does not cause any SOB or dyspnea, some nausea only ([facility]-RN)  . Ultram [Tramadol] Nausea And Vomiting    Ultram allergy also discussed, active N&V occurs with Ultram  . Latex Itching and Rash   Past Medical History:  Diagnosis Date  . Anxiety    NEW-DUE TO ANXIETY OVER DX OF CANCER  . Blood transfusion 1995  . Cancer (HCC)    RECTAL CANCER  . Constipation    SOME BLOOD IN STOOL  . GERD (gastroesophageal reflux disease)    occasionally-NO MEDS  . Kidney stone   . PONV (postoperative nausea and vomiting)    used a scop patch last surgery-was better   Family History  Problem Relation Age of Onset  . Other Sister   .  Colon cancer Paternal Aunt     Current Outpatient Medications:  .  esomeprazole (NEXIUM) 40 MG capsule, Take 1 capsule (40 mg total) by mouth daily., Disp: 30 capsule, Rfl: 3 .  estradiol (ESTRACE) 1 MG tablet, TAKE 1 TABLET TWICE A DAY, Disp: 60 tablet, Rfl: 4 .  linaclotide (LINZESS) 290 MCG CAPS capsule, Take 1 capsule (290 mcg total) by mouth daily., Disp: 30 capsule, Rfl: 5 .  SILENOR 6 MG TABS, TAKE 1 TABLET (6 MG TOTAL) BY MOUTH NIGHTLY., Disp: 30 tablet, Rfl: 0  Social Hx: never smoker.  ROS: Per HPI  Objective: Office vital signs reviewed. BP 126/78   Pulse (!) 58   Temp (!) 97.1 F (36.2 C) (Oral)   Ht 5\' 6"  (1.676 m)   Wt 212 lb (96.2 kg)   BMI 34.22 kg/m    Physical Examination:  General: Awake, alert, well nourished, holding her head down HEENT: Normal; no nuchal rigidity    Neck: No masses palpated. No lymphadenopathy    Ears: Tympanic membranes intact, normal light reflex, no erythema, no bulging    Eyes: PERRLA, extraocular membranes intact, sclera white    Nose: nasal turbinates moist, no nasal discharge    Throat: moist mucus membranes, no erythema, no tonsillar exudate.  Airway is patent Cardio: regular rate and rhythm, S1S2 heard, no murmurs appreciated Pulm: clear to auscultation bilaterally, no wheezes, rhonchi or rales; normal work of breathing on room air Neuro:  Cranial nerves II through XII grossly intact.  Upper extremity and lower extremity strength 5/5.  Light touch sensation grossly intact throughout.  Alert oriented x3.  She does demonstrate some photophobia but PERRLA appreciated.  Normal upper extremity and lower extremity cerebellar testing.  Negative Romberg.  1403: Headache 10/10, nausea moderate.  Promethazine and Toradol administered IM. 1414: Patient reevaluated.  Nausea improving.  Headache still present.  Patient resting comfortably.  No acute distress. 1437: Headache improving but still present.  Nausea resolved. 1501: Headache improved  to 6-7/10.  Patient wishes to go home now.  Assessment/ Plan: 51 y.o. female   1. Migraine with status migrainosus, not intractable, unspecified migraine type I suspect that patient is having a migraine headache.  She is afebrile and demonstrates no evidence of infection.  Her neurologic exam was unremarkable except for photophobia.  Differential diagnosis considered new onset migraine headache, tension headache, stroke, meningitis.  She was treated in office with promethazine 25 mg IM and Toradol 60 mg IM.  She was monitored for response for 58 minutes.  Headache had improved by about 30% by time of discharge.  Nausea completely resolved.  I did recommend that if headache does not resolve, she needs to follow-up in office tomorrow.  She was good understanding.  Instructions for migraine medications were reviewed with the patient.  She was prescribed Zofran, Imitrex and instructed continue Motrin if needed for headaches in the future.  Referral to neurology was placed for new onset adult migraines.  Strict return precautions and reasons for emergent evaluation in the emergency department review with patient.  They voiced understanding and will follow-up as needed.   Orders Placed This Encounter  Procedures  . Ambulatory referral to Neurology    Referral Priority:   Routine    Referral Type:   Consultation    Referral Reason:   Specialty Services Required    Requested Specialty:   Neurology    Number of Visits Requested:   1   Meds ordered this encounter  Medications  . SUMAtriptan (IMITREX) 50 MG tablet    Sig: Take 1 tablet (50 mg total) once for 1 dose by mouth. May repeat dose ONE time in 2 hours if headache persists or recurs.    Dispense:  10 tablet    Refill:  0  . DISCONTD: promethazine (PHENERGAN) injection 25 mg  . ketorolac (TORADOL) injection 60 mg  . promethazine (PHENERGAN) injection 25 mg  . ondansetron (ZOFRAN ODT) 4 MG disintegrating tablet    Sig: Take 1 tablet (4 mg  total) every 8 (eight) hours as needed by mouth for nausea or vomiting.    Dispense:  20 tablet    Refill:  [place], DO [place] [phone]

## 2017-07-03 NOTE — Progress Notes (Deleted)
Subjective: CC:*** HPI: Rajanae Mantia is a 51 y.o. female presenting to clinic today for:  1. ***   Social Hx: *** smoker.  Family History  Problem Relation Age of Onset  . Other Sister   . Colon cancer Paternal Aunt    Past Medical History:  Diagnosis Date  . Anxiety    NEW-DUE TO ANXIETY OVER DX OF CANCER  . Blood transfusion 1995  . Cancer (HCC)    RECTAL CANCER  . Constipation    SOME BLOOD IN STOOL  . GERD (gastroesophageal reflux disease)    occasionally-NO MEDS  . Kidney stone   . PONV (postoperative nausea and vomiting)    used a scop patch last surgery-was better   Allergies  Allergen Reactions  . Codeine Nausea Only    Confirmed intolerance of codeine verbally with pt in PACU, pt states does not cause any SOB or dyspnea, some nausea only (MN-RN)  . Ultram [Tramadol] Nausea And Vomiting    Ultram allergy also discussed, active N&V occurs with Ultram  . Latex Itching and Rash    Current Outpatient Medications:  .  esomeprazole (NEXIUM) 40 MG capsule, Take 1 capsule (40 mg total) by mouth daily., Disp: 30 capsule, Rfl: 3 .  estradiol (ESTRACE) 1 MG tablet, TAKE 1 TABLET TWICE A DAY, Disp: 60 tablet, Rfl: 4 .  linaclotide (LINZESS) 290 MCG CAPS capsule, Take 1 capsule (290 mcg total) by mouth daily., Disp: 30 capsule, Rfl: 5 .  ondansetron (ZOFRAN ODT) 4 MG disintegrating tablet, Take 1 tablet (4 mg total) every 8 (eight) hours as needed by mouth for nausea or vomiting., Disp: 20 tablet, Rfl: 0 .  SILENOR 6 MG TABS, TAKE 1 TABLET (6 MG TOTAL) BY MOUTH NIGHTLY., Disp: 30 tablet, Rfl: 0 .  SUMAtriptan (IMITREX) 50 MG tablet, Take 1 tablet (50 mg total) once for 1 dose by mouth. May repeat dose ONE time in 2 hours if headache persists or recurs., Disp: 10 tablet, Rfl: 0  Health Maintenance: ***  Flu Vaccine: {YES/NO/WILD TOIZT:24580}  Tdap Vaccine: {YES/NO/WILD DXIPJ:82505}  - every 61yrs - (<3 lifetime doses or unknown): all wounds -- look up need  for Tetanus IG - (>=3 lifetime doses): clean/minor wound if >65yrs from previous; all other wounds if >31yrs from previous Zoster Vaccine: {YES/NO/WILD CARDS:18581} (those >50yo, once) Pneumonia Vaccine: {YES/NO/WILD LZJQB:34193} (those w/ risk factors) - (<34yr) Both: Immunocompromised, cochlear implant, CSF leak, asplenic, sickle cell, Chronic Renal Failure - (<87yr) PPSV-23 only: Heart dz, lung disease, DM, tobacco abuse, alcoholism, cirrhosis/liver disease. - (>105yr): PPSV13 then PPSV23 in 6-12mths;  - (>96yr): repeat PPSV23 once if pt received prior to 51yo and 75yrs have passed  ROS: Per HPI  Objective: Office vital signs reviewed. There were no vitals taken for this visit.  Physical Examination:  General: Awake, alert, *** nourished, No acute distress HEENT: Normal    Neck: No masses palpated. No lymphadenopathy    Ears: Tympanic membranes intact, normal light reflex, no erythema, no bulging    Eyes: PERRLA, extraocular movement in tact, sclera ***    Nose: nasal turbinates moist, *** nasal discharge    Throat: moist mucus membranes, no erythema, *** tonsillar exudate.  Airway is patent Cardio: regular rate and rhythm, S1S2 heard, no murmurs appreciated Pulm: clear to auscultation bilaterally, no wheezes, rhonchi or rales; normal work of breathing on room air GI: soft, non-tender, non-distended, bowel sounds present x4, no hepatomegaly, no splenomegaly, no masses GU: external vaginal tissue ***, cervix ***, ***  punctate lesions on cervix appreciated, *** discharge from cervical os, *** bleeding, *** cervical motion tenderness, *** abdominal/ adnexal masses Extremities: warm, well perfused, No edema, cyanosis or clubbing; +*** pulses bilaterally MSK: *** gait and *** station Skin: dry; intact; no rashes or lesions Neuro: *** Strength and light touch sensation grossly intact, *** DTRs ***/4  Assessment/ Plan: 51 y.o. female   No problem-specific Assessment & Plan notes found  for this encounter.   Janora Norlander, DO [place] [phone]

## 2017-07-05 ENCOUNTER — Ambulatory Visit: Payer: 59 | Admitting: Family Medicine

## 2017-07-10 ENCOUNTER — Ambulatory Visit: Payer: 59 | Admitting: Family Medicine

## 2017-07-10 VITALS — BP 128/91 | HR 81 | Temp 98.1°F | Ht 66.0 in | Wt 212.0 lb

## 2017-07-10 DIAGNOSIS — R51 Headache: Secondary | ICD-10-CM

## 2017-07-10 DIAGNOSIS — G479 Sleep disorder, unspecified: Secondary | ICD-10-CM

## 2017-07-10 DIAGNOSIS — H9191 Unspecified hearing loss, right ear: Secondary | ICD-10-CM | POA: Insufficient documentation

## 2017-07-10 DIAGNOSIS — R519 Headache, unspecified: Secondary | ICD-10-CM | POA: Insufficient documentation

## 2017-07-10 HISTORY — DX: Sleep disorder, unspecified: G47.9

## 2017-07-10 MED ORDER — SUMATRIPTAN SUCCINATE 50 MG PO TABS: 50.0000 mg | ORAL_TABLET | Freq: Once | ORAL | 0 refills | Status: DC

## 2017-07-10 MED ORDER — ZOLPIDEM TARTRATE 10 MG PO TABS: 5.0000 mg | ORAL_TABLET | Freq: Every evening | ORAL | 1 refills | Status: DC | PRN

## 2017-07-10 NOTE — Progress Notes (Signed)
Subjective: CC: headache, R ear pain. PCP: Janora Norlander, DO [facility] [name] is a 51 y.o. female presenting to clinic today for:  1. Headache/ Hearing loss Patient was seen on June 27, 2017 for migraine headache which required Toradol, promethazine injections here in office.  Her migraine headache did resolve after this and she has not had recurrence of a severe headache like that since.  However, she does report that she continues to have a dull aching headache that is on the right-hand side.  She does not wake up with his headache but no said it develops as the day goes on.  She reports that when it is severe, she will have slight right-sided facial tingling.  Denies speech changes, visual disturbance, weakness or numbness and tingling elsewhere.  She notes that her balance does seem slightly off during severe headaches.  She notes that headaches normally are a 4/10 and improved to a 2/10 with ibuprofen 800 mg, which she is taking about twice a day.  She reports use of Imitrex which did almost completely resolve the headache but they always return.  She notes extensive caffeine use, poor sleep.  She does report that she did hit her head on the right side at work about a month ago.  Since then she notes muffled hearing on the right side.  Denies fevers, chills, nuchal rigidity.  No nausea, vomiting, abdominal pain, loss of consciousness, photophobia or phonophobia  2. Insomnia Patient reports that she has had difficulty with sleep for years.  She wakes up at 4:00 every morning to go to work.  She reports difficulty falling asleep and staying asleep.  She does report excessive caffeine intake.  She previously was treated with Ambien which worked fairly well for her.  She was transitioned over to [place], which is not helped her sleep at all.  She is tried trazodone in the past as well with little improvement in sleep.  Denies snoring, apnea.  Allergies  Allergen Reactions  .  Codeine Nausea Only    Confirmed intolerance of codeine verbally with pt in PACU, pt states does not cause any SOB or dyspnea, some nausea only (MN-RN)  . Ultram [Tramadol] Nausea And Vomiting    Ultram allergy also discussed, active N&V occurs with Ultram  . Latex Itching and Rash   Past Medical History:  Diagnosis Date  . Anxiety    NEW-DUE TO ANXIETY OVER DX OF CANCER  . Blood transfusion 1995  . Cancer (HCC)    RECTAL CANCER  . Constipation    SOME BLOOD IN STOOL  . GERD (gastroesophageal reflux disease)    occasionally-NO MEDS  . Kidney stone   . PONV (postoperative nausea and vomiting)    used a scop patch last surgery-was better   Family History  Problem Relation Age of Onset  . Other Sister   . Colon cancer Paternal Aunt     Current Outpatient Medications:  .  esomeprazole (NEXIUM) 40 MG capsule, Take 1 capsule (40 mg total) by mouth daily., Disp: 30 capsule, Rfl: 3 .  estradiol (ESTRACE) 1 MG tablet, TAKE 1 TABLET TWICE A DAY, Disp: 60 tablet, Rfl: 4 .  linaclotide (LINZESS) 290 MCG CAPS capsule, Take 1 capsule (290 mcg total) by mouth daily., Disp: 30 capsule, Rfl: 5 .  SILENOR 6 MG TABS, TAKE 1 TABLET (6 MG TOTAL) BY MOUTH NIGHTLY., Disp: 30 tablet, Rfl: 0 .  ondansetron (ZOFRAN ODT) 4 MG disintegrating tablet, Take 1 tablet (4 mg  total) every 8 (eight) hours as needed by mouth for nausea or vomiting. (Patient not taking: Reported on 07/10/2017), Disp: 20 tablet, Rfl: 0  Social Hx: non smoker.   ROS: Per HPI  Objective: Office vital signs reviewed. BP (!) 128/91   Pulse 81   Temp 98.1 F (36.7 C) (Oral)   Ht 5\' 6"  (1.676 m)   Wt 212 lb (96.2 kg)   BMI 34.22 kg/m   Physical Examination:  General: Awake, alert, obese, No acute distress HEENT: Normal    Neck: No masses palpated. No lymphadenopathy    Ears: Tympanic membranes intact, normal light reflex, no erythema, no bulging    Eyes: PERRLA, extraocular membranes intact, sclera white    Nose: nasal  turbinates moist, no nasal discharge    Throat: moist mucus membranes, no erythema, no tonsillar exudate.  Airway is patent Cardio: regular rate and rhythm, S1S2 heard, no murmurs appreciated Pulm: clear to auscultation bilaterally, no wheezes, rhonchi or rales; normal work of breathing on room air Neuro: Slightly decreased hearing on the right.  Otherwise, cranial nerves II through XII are grossly intact.  She has normal upper extremity and lower extremity cerebellar testing.  Normal gait.  Light touch sensation grossly intact throughout.  She has 5/5 upper extremity and lower extremity strength.  Assessment/ Plan: 51 y.o. female   1. Headache disorder Her episode 2 weeks ago certainly fits a migraine headache picture.  However, she seems to continue to have daily right-sided headaches.  No sleep apnea type symptoms to suggest headache is from OSA.  She does report poor sleep which may be contributing to headache.  See below.  I recommended that she gradually reduce caffeine intake.  Ideally, would like to taper off of Motrin but I suspect that given her prolonged use she will likely have rebound headaches.  She will keep a headache log and bring this to her neurologic appointment.  I have referred her to neurology for further assessment and management.  Strict return precautions and reasons for emergent evaluation in the emergency department review with patient.  They voiced understanding and will follow-up as needed. - Ambulatory referral to Neurology  2. Change in hearing of right ear Referral to audiology for unilateral hearing difficulty. - Ambulatory referral to Audiology  3. Sleep disorder Sleep has not improved with Silenor.  I did review sleep hygiene.  A small amount of Ambien has been prescribed to patient as this has helped her in the past.  I did discuss with her that this is not intended for daily use and that if medications needed to control her migraine headaches interacted with  this medication, I would favor discontinuing the Ambien and treating her headache.  She voiced good understanding and will follow-up in 1 month.   Orders Placed This Encounter  Procedures  . Ambulatory referral to Neurology    Referral Priority:   Routine    Referral Type:   Consultation    Referral Reason:   Specialty Services Required    Requested Specialty:   Neurology    Number of Visits Requested:   1  . Ambulatory referral to Audiology    Referral Priority:   Routine    Referral Type:   Audiology Exam    Referral Reason:   Specialty Services Required    Number of Visits Requested:   1   Meds ordered this encounter  Medications  . zolpidem (AMBIEN) 10 MG tablet    Sig: Take 0.5-1 tablets (5-10  mg total) by mouth at bedtime as needed for sleep.    Dispense:  15 tablet    Refill:  1  . SUMAtriptan (IMITREX) 50 MG tablet    Sig: Take 1 tablet (50 mg total) by mouth once for 1 dose. May repeat dose ONE time in 2 hours if headache persists or recurs.    Dispense:  10 tablet    Refill:  0     Ashly Windell Moulding, DO [place] [phone]

## 2017-07-10 NOTE — Patient Instructions (Signed)
I have placed referrals to audiology for your right-sided hearing loss and to neurology for your chronic headache.  As we discussed, I do want you keeping a daily headache log.  Make sure you are describing the headache, noting what time it happened, giving it a #1 through 10, and reporting associated symptoms (example like nausea, vomiting, facial numbness.)  I do recommend that you start titrating down on your caffeine consumption.  Do this gradually so as not to exacerbate your headache.  Avoid caffeinated beverages, fluids before bedtime.  I have given you a small amount of Ambien to use as needed for sleep.  Do not drink with this medication.  Do not operate heavy machinery with this medicine.  This is not intended for daily use.  Please work on your sleep hygiene so as to improve sleep.  Controlled substance Guidelines:  1. You cannot get an early refill, even it is lost.  2. You cannot get pain medications from any other doctor, unless it is the emergency department and related to a new problem or injury.  3. You cannot use alcohol, marijuana, cocaine or any other recreational drugs while using this medication. This is very dangerous.  4. You are willing to have your urine drug tested at each visit.  5. You will not drive while using this medication, because that can put yourself and others in serious danger of an accident. 6. If any medication is stolen, then there must be a police report to verify it, or it cannot be refilled.  7. I will not prescribe these medications for longer than 3 months.  8. You must bring your pill bottle to each visit.  9. You must use the same pharmacy for all refills for the medication, unless you clear it with me beforehand.  10. You cannot share or sell this medication.  11. Always take a stool softener to prevent constipation.  Insomnia Insomnia is a sleep disorder that makes it difficult to fall asleep or to stay asleep. Insomnia can cause tiredness (fatigue),  low energy, difficulty concentrating, mood swings, and poor performance at work or school. There are three different ways to classify insomnia:  Difficulty falling asleep.  Difficulty staying asleep.  Waking up too early in the morning.  Any type of insomnia can be long-term (chronic) or short-term (acute). Both are common. Short-term insomnia usually lasts for three months or less. Chronic insomnia occurs at least three times a week for longer than three months. What are the causes? Insomnia may be caused by another condition, situation, or substance, such as:  Anxiety.  Certain medicines.  Gastroesophageal reflux disease (GERD) or other gastrointestinal conditions.  Asthma or other breathing conditions.  Restless legs syndrome, sleep apnea, or other sleep disorders.  Chronic pain.  Menopause. This may include hot flashes.  Stroke.  Abuse of alcohol, tobacco, or illegal drugs.  Depression.  Caffeine.  Neurological disorders, such as Alzheimer disease.  An overactive thyroid (hyperthyroidism).  The cause of insomnia may not be known. What increases the risk? Risk factors for insomnia include:  Gender. Women are more commonly affected than men.  Age. Insomnia is more common as you get older.  Stress. This may involve your professional or personal life.  Income. Insomnia is more common in people with lower income.  Lack of exercise.  Irregular work schedule or night shifts.  Traveling between different time zones.  What are the signs or symptoms? If you have insomnia, trouble falling asleep or trouble staying  asleep is the main symptom. This may lead to other symptoms, such as:  Feeling fatigued.  Feeling nervous about going to sleep.  Not feeling rested in the morning.  Having trouble concentrating.  Feeling irritable, anxious, or depressed.  How is this treated? Treatment for insomnia depends on the cause. If your insomnia is caused by an  underlying condition, treatment will focus on addressing the condition. Treatment may also include:  Medicines to help you sleep.  Counseling or therapy.  Lifestyle adjustments.  Follow these instructions at home:  Take medicines only as directed by your health care provider.  Keep regular sleeping and waking hours. Avoid naps.  Keep a sleep diary to help you and your health care provider figure out what could be causing your insomnia. Include: ? When you sleep. ? When you wake up during the night. ? How well you sleep. ? How rested you feel the next day. ? Any side effects of medicines you are taking. ? What you eat and drink.  Make your bedroom a comfortable place where it is easy to fall asleep: ? Put up shades or special blackout curtains to block light from outside. ? Use a white noise machine to block noise. ? Keep the temperature cool.  Exercise regularly as directed by your health care provider. Avoid exercising right before bedtime.  Use relaxation techniques to manage stress. Ask your health care provider to suggest some techniques that may work well for you. These may include: ? Breathing exercises. ? Routines to release muscle tension. ? Visualizing peaceful scenes.  Cut back on alcohol, caffeinated beverages, and cigarettes, especially close to bedtime. These can disrupt your sleep.  Do not overeat or eat spicy foods right before bedtime. This can lead to digestive discomfort that can make it hard for you to sleep.  Limit screen use before bedtime. This includes: ? Watching TV. ? Using your smartphone, tablet, and computer.  Stick to a routine. This can help you fall asleep faster. Try to do a quiet activity, brush your teeth, and go to bed at the same time each night.  Get out of bed if you are still awake after 15 minutes of trying to sleep. Keep the lights down, but try reading or doing a quiet activity. When you feel sleepy, go back to bed.  Make sure  that you drive carefully. Avoid driving if you feel very sleepy.  Keep all follow-up appointments as directed by your health care provider. This is important. Contact a health care provider if:  You are tired throughout the day or have trouble in your daily routine due to sleepiness.  You continue to have sleep problems or your sleep problems get worse. Get help right away if:  You have serious thoughts about hurting yourself or someone else. This information is not intended to replace advice given to you by your health care provider. Make sure you discuss any questions you have with your health care provider. Document Released: 07/29/2000 Document Revised: 01/01/2016 Document Reviewed: 05/02/2014 Elsevier Interactive Patient Education  [employer].

## 2017-07-21 DIAGNOSIS — Z1231 Encounter for screening mammogram for malignant neoplasm of breast: Secondary | ICD-10-CM | POA: Diagnosis not present

## 2017-07-21 DIAGNOSIS — Z85048 Personal history of other malignant neoplasm of rectum, rectosigmoid junction, and anus: Secondary | ICD-10-CM | POA: Diagnosis not present

## 2017-07-21 DIAGNOSIS — R102 Pelvic and perineal pain: Secondary | ICD-10-CM | POA: Diagnosis not present

## 2017-07-21 DIAGNOSIS — Z01411 Encounter for gynecological examination (general) (routine) with abnormal findings: Secondary | ICD-10-CM | POA: Diagnosis not present

## 2017-08-02 ENCOUNTER — Ambulatory Visit: Payer: 59 | Admitting: Gastroenterology

## 2017-08-04 DIAGNOSIS — R102 Pelvic and perineal pain: Secondary | ICD-10-CM | POA: Diagnosis not present

## 2017-08-09 DIAGNOSIS — R928 Other abnormal and inconclusive findings on diagnostic imaging of breast: Secondary | ICD-10-CM | POA: Diagnosis not present

## 2017-08-10 ENCOUNTER — Ambulatory Visit: Payer: Self-pay | Admitting: Neurology

## 2017-08-10 ENCOUNTER — Encounter: Payer: Self-pay | Admitting: Neurology

## 2017-08-10 ENCOUNTER — Ambulatory Visit: Payer: 59 | Admitting: Neurology

## 2017-08-10 VITALS — BP 126/85 | HR 80 | Ht 66.0 in | Wt 212.0 lb

## 2017-08-10 DIAGNOSIS — G43009 Migraine without aura, not intractable, without status migrainosus: Secondary | ICD-10-CM | POA: Diagnosis not present

## 2017-08-10 DIAGNOSIS — C7931 Secondary malignant neoplasm of brain: Secondary | ICD-10-CM

## 2017-08-10 DIAGNOSIS — R519 Headache, unspecified: Secondary | ICD-10-CM

## 2017-08-10 DIAGNOSIS — R51 Headache with orthostatic component, not elsewhere classified: Secondary | ICD-10-CM

## 2017-08-10 DIAGNOSIS — R5383 Other fatigue: Secondary | ICD-10-CM

## 2017-08-10 DIAGNOSIS — G8929 Other chronic pain: Secondary | ICD-10-CM

## 2017-08-10 DIAGNOSIS — H539 Unspecified visual disturbance: Secondary | ICD-10-CM

## 2017-08-10 MED ORDER — RIZATRIPTAN BENZOATE 10 MG PO TABS: 10.0000 mg | ORAL_TABLET | ORAL | 12 refills | Status: DC | PRN

## 2017-08-10 MED ORDER — AMITRIPTYLINE HCL 25 MG PO TABS: 25.0000 mg | ORAL_TABLET | Freq: Every day | ORAL | 3 refills | Status: DC

## 2017-08-10 MED ORDER — KETOROLAC TROMETHAMINE 60 MG/2ML IM SOLN
60.0000 mg | Freq: Once | INTRAMUSCULAR | Status: AC
  Administered 2017-08-10: 60 mg via INTRAMUSCULAR

## 2017-08-10 NOTE — Progress Notes (Signed)
Received VO from Dr. Jaynee Eagles to give ketorolac 60mg /38mL IM injection once today.  Using aseptic technique, pt was given 1 mL ketorolac IM in the left deltoid, and then 1 mL ketorolac IM in the right deltoid.  Bandaid applied to both injection sites. Pt tolerated procedure well, reports that her pain in her head decreased from a 6-7 to a 4. Pt felt that she was able to drive and was escorted to the lab area for her lab draw.  I asked pt to call office for any questions or concerns or a change in symptoms.

## 2017-08-10 NOTE — Addendum Note (Signed)
Addended by: Lester Bloomingdale A on: 08/10/2017 09:28 AM   Modules accepted: Orders

## 2017-08-10 NOTE — Progress Notes (Signed)
[facility]    Provider:  Dr Jaynee Eagles Referring Provider: Janora Norlander, DO Primary Care Physician:  Janora Norlander, DO  CC:  Migraine  HPI:  [name] is a 51 y.o. female here as a referral from Dr. Lajuana Ripple for migraines. She has a history of migraine, colorectal cancer, chronic low back pain on ocassional oxy (sees dr Nelva Bush).  Migraines started this past October. Mother has migraines. She does not sleep well, she has frequent awakening, she sleeps a few hours. When she gets sleep, the migraines are better. Unclear if stress is triggering migraines. The worse headache she experienced was in mid November, her vision was blurred, she felt off balance, movement made it worse, she had nausea and vomiting. Imitrex helps but the headache returns less severe. Unknown triggers. Migraine is on the right side, can come on acutely, moves towards the front of the face behind her eye, hearing can be affected, pulsating and throbbing, nausea, photo/phono/osmophobia. She has right-sided paresthesias in the face. There is a lot of stress in her family and this is affecting her. Her stepfather committed suicide.  She has a daily dull headache. She takes acute medication 15 days a month. She has about 8 migraine days a month and she had one severe in November necessitating a visit to the doctor. She uses caffeine. She has morning headaches.No other focal neurologic deficits, associated symptoms, inciting events or modifiable factors.  Reviewed notes, labs and imaging from outside physicians, which showed:   Reviewed referring physician notes.  Patient has a history of migraines.  She has had severe migraines in the past requiring Toradol, promethazine injections in the office.  She continues to have dull aching headache in the right side, does not wake up with headache but developed visit as the day goes on, when it severe she can have right-sided facial tingling.  Headaches  normally are 4 out of 10 and she takes ibuprofen which she is taking about twice a day.  Use of Imitrex helps but they always return.  Extensive caffeine use and poor sleep.  Review of Systems: Patient complains of symptoms per HPI as well as the following symptoms: memory loss, confusion. Pertinent negatives and positives per HPI. All others negative.   Social History   Socioeconomic History  . Marital status: Single    Spouse name: Not on file  . Number of children: 2  . Years of education: Not on file  . Highest education level: Not on file  Social Needs  . Financial resource strain: Not on file  . Food insecurity - worry: Not on file  . Food insecurity - inability: Not on file  . Transportation needs - medical: Not on file  . Transportation needs - non-medical: Not on file  Occupational History  . Occupation: Hotel manager    Employer: [employer]  Tobacco Use  . Smoking status: Never Smoker  . Smokeless tobacco: Never Used  Substance and Sexual Activity  . Alcohol use: Yes    Comment: occasional couple times per mo  . Drug use: No  . Sexual activity: Yes    Birth control/protection: Surgical  Other Topics Concern  . Not on file  Social History Narrative   Lives alone & 2 kids          Family History  Problem Relation Age of Onset  . Other Sister   . Migraines Mother   . Colon cancer Paternal Aunt     Past Medical  History:  Diagnosis Date  . Anxiety    NEW-DUE TO ANXIETY OVER DX OF CANCER  . Blood transfusion 1995  . Cancer (HCC)    RECTAL CANCER  . Constipation    SOME BLOOD IN STOOL  . GERD (gastroesophageal reflux disease)    occasionally-NO MEDS  . Kidney stone   . PONV (postoperative nausea and vomiting)    used a scop patch last surgery-was better    Past Surgical History:  Procedure Laterality Date  . ABDOMINAL HYSTERECTOMY  1995   partial  . BACK SURGERY  2015  . COLON SURGERY    . COLONOSCOPY  06/01/2012   Procedure:  COLONOSCOPY;  Surgeon: Danie Binder, MD;  Location: [facility];  Service: Endoscopy;  Laterality: N/A;  1:30PM  . COLONOSCOPY N/A 06/14/2013   QMV:HQIO diverticulosis/normal anastomosis  . endoscopic left fallopian tube removed  several yrs ago  . EUS  06/06/2012   Procedure: LOWER ENDOSCOPIC ULTRASOUND (EUS);  Surgeon: Arta Silence, MD;  Location: [facility];  Service: Endoscopy;  Laterality: N/A;  . EXAMINATION UNDER ANESTHESIA  09/07/2012   Procedure: EXAM UNDER ANESTHESIA;  Surgeon: Stark Klein, MD;  Location: [place];  Service: General;  Laterality: N/A;  . EXCISION/RELEASE BURSA HIP  09/15/2011   Dr Tonita Cong EXCISION/RELEASE BURSA HIP;  Surgeon: Johnn Hai, MD;  Location: [facility];  Service: Orthopedics;  Laterality: Left;  Excision of Trochanteric Bursitis  . FLEXIBLE SIGMOIDOSCOPY N/A 03/22/2013   Procedure: FLEXIBLE SIGMOIDOSCOPY;  Surgeon: Danie Binder, MD;  Location: [facility];  Service: Endoscopy;  Laterality: N/A;  2:00  . LAPAROSCOPIC LOW ANTERIOR RESECTION  07/02/2012   Procedure: LAPAROSCOPIC LOW ANTERIOR RESECTION;  Surgeon: Stark Klein, MD;  Location: [facility];  Service: General;  Laterality: N/A;  . left ankle surgery for fx  several yrs ago  . LUMBAR LAMINECTOMY/DECOMPRESSION MICRODISCECTOMY N/A 05/14/2014   Procedure: LUMBAR DECOMPRESSION L2-L3;  Surgeon: Johnn Hai, MD;  Location: [facility];  Service: Orthopedics;  Laterality: N/A;  . PROCTOSCOPY  09/07/2012   Procedure: PROCTOSCOPY;  Surgeon: Stark Klein, MD;  Location: [place];  Service: General;  Laterality: N/A;    Current Outpatient Medications  Medication Sig Dispense Refill  . esomeprazole (NEXIUM) 40 MG capsule Take 1 capsule (40 mg total) by mouth daily. 30 capsule 3  . estradiol (ESTRACE) 1 MG tablet TAKE 1 TABLET TWICE A DAY 60 tablet 4  . linaclotide (LINZESS) 290 MCG CAPS capsule Take 1 capsule (290 mcg total) by mouth daily. 30 capsule 5  . amitriptyline  (ELAVIL) 25 MG tablet Take 1 tablet (25 mg total) by mouth at bedtime. 30 tablet 3  . rizatriptan (MAXALT) 10 MG tablet Take 1 tablet (10 mg total) by mouth as needed for migraine. May repeat in 2 hours if needed 10 tablet 12  . SUMAtriptan (IMITREX) 50 MG tablet Take 1 tablet (50 mg total) by mouth once for 1 dose. May repeat dose ONE time in 2 hours if headache persists or recurs. 10 tablet 0  . zolpidem (AMBIEN) 10 MG tablet Take 0.5-1 tablets (5-10 mg total) by mouth at bedtime as needed for sleep. 15 tablet 1   No current facility-administered medications for this visit.     Allergies as of 08/10/2017 - Review Complete 08/10/2017  Allergen Reaction Noted  . Codeine Nausea Only 12/15/2010  . Ultram [tramadol] Nausea And Vomiting 05/25/2012  . Latex Itching and Rash 07/03/2012    Vitals: BP 126/85  Pulse 80   Ht 5\' 6"  (1.676 m)   Wt 212 lb (96.2 kg)   BMI 34.22 kg/m  Last Weight:  Wt Readings from Last 1 Encounters:  08/10/17 212 lb (96.2 kg)   Last Height:   Ht Readings from Last 1 Encounters:  08/10/17 5\' 6"  (1.676 m)    Physical exam: Exam: Gen: NAD, conversant, well nourised, obese, well groomed                     CV: RRR, no MRG. No Carotid Bruits. No peripheral edema, warm, nontender Eyes: Conjunctivae clear without exudates or hemorrhage  Neuro: Detailed Neurologic Exam  Speech:    Speech is normal; fluent and spontaneous with normal comprehension.  Cognition:    The patient is oriented to person, place, and time;     recent and remote memory intact;     language fluent;     normal attention, concentration,     fund of knowledge Cranial Nerves:    The pupils are equal, round, and reactive to light. The fundi are normal and spontaneous venous pulsations are present. Visual fields are full to finger confrontation. Extraocular movements are intact. Trigeminal sensation is intact and the muscles of mastication are normal. The face is symmetric. The palate  elevates in the midline. Hearing intact. Voice is normal. Shoulder shrug is normal. The tongue has normal motion without fasciculations.   Coordination:    Normal finger to nose and heel to shin. Normal rapid alternating movements.   Gait:    Heel-toe and tandem gait are normal.   Motor Observation:    No asymmetry, no atrophy, and no involuntary movements noted. Tone:    Normal muscle tone.    Posture:    Posture is normal. normal erect    Strength:    Strength is V/V in the upper and lower limbs.      Sensation: intact to LT     Reflex Exam:  DTR's:    Deep tendon reflexes in the upper and lower extremities are normal bilaterally.   Toes:    The toes are downgoing bilaterally.   Clonus:    Clonus is absent.      Assessment/Plan:  Patient with new onset migraine.  Magnesium citrate 400mg  to 600mg  daily, riboflavin 400mg  daily, Coenzyme Q 10 100mg  three times daily  Consider joining Facebook group [facility].  Start Amitriptyline an hour before bedtime every night for migraine prevention and insomnia. Do not take with ambien or narcotics.   At onset of migraine take Maxalt (Rizatriptan) for acute management: Please take one tablet at the onset of your headache. If it does not improve the symptoms please take one additional tablet. Do not take more then 2 tablets in 24hrs. Do not take use more then 2 to 3 times in a week.  Headache journal  MRI brain due to morning headaches, vision changes, positional headaches, PMHx of cancer to evaluate for intracranial causes such as tumor, masses, metastasis  Labs   May consider sleep test in the future for OSA   Discussed: To prevent or relieve headaches, try the following: Cool Compress. Lie down and place a cool compress on your head.  Avoid headache triggers. If certain foods or odors seem to have triggered your migraines in the past, avoid them. A headache diary might help you identify triggers.    Include physical activity in your daily routine. Try a daily walk or other moderate aerobic  exercise.  Manage stress. Find healthy ways to cope with the stressors, such as delegating tasks on your to-do list.  Practice relaxation techniques. Try deep breathing, yoga, massage and visualization.  Eat regularly. Eating regularly scheduled meals and maintaining a healthy diet might help prevent headaches. Also, drink plenty of fluids.  Follow a regular sleep schedule. Sleep deprivation might contribute to headaches Consider biofeedback. With this mind-body technique, you learn to control certain bodily functions - such as muscle tension, heart rate and blood pressure - to prevent headaches or reduce headache pain.    Proceed to emergency room if you experience new or worsening symptoms or symptoms do not resolve, if you have new neurologic symptoms or if headache is severe, or for any concerning symptom.   Provided education and documentation from American headache Society toolbox including articles on: chronic migraine medication overuse headache, chronic migraines, prevention of migraines, behavioral and other nonpharmacologic treatments for headache.    Sarina Ill, MD  [facility] [address] [place], [place] [address]  Phone [phone] Fax [phone]

## 2017-08-10 NOTE — Patient Instructions (Signed)
Magnesium citrate 400mg  to 600mg  daily, riboflavin 400mg  daily, Coenzyme Q 10 100mg  three times daily  Consider joining Facebook group [facility].  Start Amitriptyline an hour before bedtime every night for prevention  At onset of migraine take Maxalt (Rizatriptan) for acute management: Please take one tablet at the onset of your headache. If it does not improve the symptoms please take one additional tablet. Do not take more then 2 tablets in 24hrs. Do not take use more then 2 to 3 times in a week.  Headache journal  MRI brain  Amitriptyline tablets What is this medicine? AMITRIPTYLINE (a mee TRIP ti leen) is used to treat depression. This medicine may be used for other purposes; ask your health care provider or pharmacist if you have questions. COMMON BRAND NAME(S): Elavil, Vanatrip What should I tell my health care provider before I take this medicine? They need to know if you have any of these conditions: -an alcohol problem -asthma, difficulty breathing -bipolar disorder or schizophrenia -difficulty passing urine, prostate trouble -glaucoma -heart disease or previous heart attack -liver disease -over active thyroid -seizures -thoughts or plans of suicide, a previous suicide attempt, or family history of suicide attempt -an unusual or allergic reaction to amitriptyline, other medicines, foods, dyes, or preservatives -pregnant or trying to get pregnant -breast-feeding How should I use this medicine? Take this medicine by mouth with a drink of water. Follow the directions on the prescription label. You can take the tablets with or without food. Take your medicine at regular intervals. Do not take it more often than directed. Do not stop taking this medicine suddenly except upon the advice of your doctor. Stopping this medicine too quickly may cause serious side effects or your condition may worsen. A special MedGuide will be given to you by the pharmacist with  each prescription and refill. Be sure to read this information carefully each time. Talk to your pediatrician regarding the use of this medicine in children. Special care may be needed. Overdosage: If you think you have taken too much of this medicine contact a poison control center or emergency room at once. NOTE: This medicine is only for you. Do not share this medicine with others. What if I miss a dose? If you miss a dose, take it as soon as you can. If it is almost time for your next dose, take only that dose. Do not take double or extra doses. What may interact with this medicine? Do not take this medicine with any of the following medications: -arsenic trioxide -certain medicines used to regulate abnormal heartbeat or to treat other heart conditions -cisapride -droperidol -halofantrine -linezolid -MAOIs like Carbex, Eldepryl, Marplan, Nardil, and Parnate -methylene blue -other medicines for mental depression -phenothiazines like perphenazine, thioridazine and chlorpromazine -pimozide -probucol -procarbazine -sparfloxacin -St. John's Wort -ziprasidone This medicine may also interact with the following medications: -atropine and related drugs like hyoscyamine, scopolamine, tolterodine and others -barbiturate medicines for inducing sleep or treating seizures, like phenobarbital -cimetidine -disulfiram -ethchlorvynol -thyroid hormones such as levothyroxine This list may not describe all possible interactions. Give your health care provider a list of all the medicines, herbs, non-prescription drugs, or dietary supplements you use. Also tell them if you smoke, drink alcohol, or use illegal drugs. Some items may interact with your medicine. What should I watch for while using this medicine? Tell your doctor if your symptoms do not get better or if they get worse. Visit your doctor or health care professional for regular checks  on your progress. Because it may take several weeks to  see the full effects of this medicine, it is important to continue your treatment as prescribed by your doctor. Patients and their families should watch out for new or worsening thoughts of suicide or depression. Also watch out for sudden changes in feelings such as feeling anxious, agitated, panicky, irritable, hostile, aggressive, impulsive, severely restless, overly excited and hyperactive, or not being able to sleep. If this happens, especially at the beginning of treatment or after a change in dose, call your health care professional. Dennis Bast may get drowsy or dizzy. Do not drive, use machinery, or do anything that needs mental alertness until you know how this medicine affects you. Do not stand or sit up quickly, especially if you are an older patient. This reduces the risk of dizzy or fainting spells. Alcohol may interfere with the effect of this medicine. Avoid alcoholic drinks. Do not treat yourself for coughs, colds, or allergies without asking your doctor or health care professional for advice. Some ingredients can increase possible side effects. Your mouth may get dry. Chewing sugarless gum or sucking hard candy, and drinking plenty of water will help. Contact your doctor if the problem does not go away or is severe. This medicine may cause dry eyes and blurred vision. If you wear contact lenses you may feel some discomfort. Lubricating drops may help. See your eye doctor if the problem does not go away or is severe. This medicine can cause constipation. Try to have a bowel movement at least every 2 to 3 days. If you do not have a bowel movement for 3 days, call your doctor or health care professional. This medicine can make you more sensitive to the sun. Keep out of the sun. If you cannot avoid being in the sun, wear protective clothing and use sunscreen. Do not use sun lamps or tanning beds/booths. What side effects may I notice from receiving this medicine? Side effects that you should report to  your doctor or health care professional as soon as possible: -allergic reactions like skin rash, itching or hives, swelling of the face, lips, or tongue -anxious -breathing problems -changes in vision -confusion -elevated mood, decreased need for sleep, racing thoughts, impulsive behavior -eye pain -fast, irregular heartbeat -feeling faint or lightheaded, falls -feeling agitated, angry, or irritable -fever with increased sweating -hallucination, loss of contact with reality -seizures -stiff muscles -suicidal thoughts or other mood changes -tingling, pain, or numbness in the feet or hands -trouble passing urine or change in the amount of urine -trouble sleeping -unusually weak or tired -vomiting -yellowing of the eyes or skin Side effects that usually do not require medical attention (report to your doctor or health care professional if they continue or are bothersome): -change in sex drive or performance -change in appetite or weight -constipation -dizziness -dry mouth -nausea -tired -tremors -upset stomach This list may not describe all possible side effects. Call your doctor for medical advice about side effects. You may report side effects to FDA at 1-800-FDA-1088. Where should I keep my medicine? Keep out of the reach of children. Store at room temperature between 20 and 25 degrees C (68 and 77 degrees F). Throw away any unused medicine after the expiration date. NOTE: This sheet is a summary. It may not cover all possible information. If you have questions about this medicine, talk to your doctor, pharmacist, or health care provider.  2018 Elsevier/Gold Standard (2016-01-01 12:14:15)

## 2017-08-11 ENCOUNTER — Telehealth: Payer: Self-pay | Admitting: *Deleted

## 2017-08-11 LAB — COMPREHENSIVE METABOLIC PANEL
ALBUMIN: 4.4 g/dL (ref 3.5–5.5)
ALT: 13 IU/L (ref 0–32)
AST: 15 IU/L (ref 0–40)
Albumin/Globulin Ratio: 1.6 (ref 1.2–2.2)
Alkaline Phosphatase: 62 IU/L (ref 39–117)
BUN / CREAT RATIO: 13 (ref 9–23)
BUN: 10 mg/dL (ref 6–24)
CHLORIDE: 105 mmol/L (ref 96–106)
CO2: 23 mmol/L (ref 20–29)
Calcium: 9.5 mg/dL (ref 8.7–10.2)
Creatinine, Ser: 0.76 mg/dL (ref 0.57–1.00)
GFR calc non Af Amer: 92 mL/min/{1.73_m2} (ref >59)
GFR, EST AFRICAN AMERICAN: 107 mL/min/{1.73_m2} (ref >59)
GLUCOSE: 85 mg/dL (ref 65–99)
Globulin, Total: 2.8 g/dL (ref 1.5–4.5)
Potassium: 4.8 mmol/L (ref 3.5–5.2)
Sodium: 142 mmol/L (ref 134–144)
TOTAL PROTEIN: 7.2 g/dL (ref 6.0–8.5)

## 2017-08-11 LAB — CBC
HEMATOCRIT: 44.1 % (ref 34.0–46.6)
Hemoglobin: 14.5 g/dL (ref 11.1–15.9)
MCH: 29.9 pg (ref 26.6–33.0)
MCHC: 32.9 g/dL (ref 31.5–35.7)
MCV: 91 fL (ref 79–97)
PLATELETS: 234 10*3/uL (ref 150–379)
RBC: 4.85 x10E6/uL (ref 3.77–5.28)
RDW: 13.3 % (ref 12.3–15.4)
WBC: 5.7 10*3/uL (ref 3.4–10.8)

## 2017-08-11 LAB — TSH: TSH: 1.87 u[IU]/mL (ref 0.450–4.500)

## 2017-08-11 NOTE — Telephone Encounter (Addendum)
Called and spoke with patient. She verbalized understanding of lab results: labs look excellent. She had no questions and verbalized appreciation for the call.    ----- Message from Melvenia Beam, MD sent at 08/11/2017  9:22 AM EST ----- Labs look excellent thanks

## 2017-08-17 ENCOUNTER — Telehealth: Payer: Self-pay | Admitting: Neurology

## 2017-08-17 ENCOUNTER — Other Ambulatory Visit: Payer: Self-pay | Admitting: Neurology

## 2017-08-17 MED ORDER — ALPRAZOLAM 0.25 MG PO TABS: ORAL_TABLET | ORAL | 0 refills | Status: DC

## 2017-08-17 NOTE — Telephone Encounter (Signed)
Pt returned [name]'s call °

## 2017-08-17 NOTE — Telephone Encounter (Signed)
Faxed Xanax prescription to pt's pharmacy. Received a receipt of confirmation.

## 2017-08-17 NOTE — Telephone Encounter (Signed)
I spoke to the patient and scheduled her MRI for Wednesday 08/23/16 at the [facility] mobile unit.. She did inform me she is claustrophobic and would like something to take to help her nerves.

## 2017-08-23 ENCOUNTER — Ambulatory Visit: Payer: 59

## 2017-08-23 DIAGNOSIS — R51 Headache with orthostatic component, not elsewhere classified: Secondary | ICD-10-CM

## 2017-08-23 DIAGNOSIS — H539 Unspecified visual disturbance: Secondary | ICD-10-CM | POA: Diagnosis not present

## 2017-08-23 DIAGNOSIS — C7931 Secondary malignant neoplasm of brain: Secondary | ICD-10-CM | POA: Diagnosis not present

## 2017-08-23 DIAGNOSIS — R5383 Other fatigue: Secondary | ICD-10-CM | POA: Diagnosis not present

## 2017-08-23 DIAGNOSIS — R519 Headache, unspecified: Secondary | ICD-10-CM

## 2017-08-23 DIAGNOSIS — G8929 Other chronic pain: Secondary | ICD-10-CM

## 2017-08-23 MED ORDER — GADOPENTETATE DIMEGLUMINE 469.01 MG/ML IV SOLN: 20.0000 mL | Freq: Once | INTRAVENOUS | Status: DC | PRN

## 2017-08-24 ENCOUNTER — Telehealth: Payer: Self-pay | Admitting: *Deleted

## 2017-08-24 NOTE — Telephone Encounter (Signed)
-----   Message from Melvenia Beam, MD sent at 08/24/2017  5:11 PM EST ----- MRI brain normal

## 2017-08-24 NOTE — Telephone Encounter (Signed)
Called and LVM (ok per DPR) informing patient her MRI brain is normal. I encouraged patient to call the office with any questions but that a call back was not required. I left the office number on the message.

## 2017-08-25 DIAGNOSIS — R102 Pelvic and perineal pain: Secondary | ICD-10-CM | POA: Diagnosis not present

## 2017-08-25 DIAGNOSIS — R6882 Decreased libido: Secondary | ICD-10-CM | POA: Diagnosis not present

## 2017-08-28 ENCOUNTER — Other Ambulatory Visit: Payer: Self-pay | Admitting: Family Medicine

## 2017-08-28 NOTE — Telephone Encounter (Signed)
What is the name of the medication? Valtrex  Have you contacted your pharmacy to request a refill? NO  Which pharmacy would you like this sent to? [facility] in [place]   Patient notified that their request is being sent to the clinical staff for review and that they should receive a call once it is complete. If they do not receive a call within 24 hours they can check with their pharmacy or our office.

## 2017-08-28 NOTE — Telephone Encounter (Signed)
Not on med list - will forward to DR G for approval

## 2017-08-29 NOTE — Telephone Encounter (Signed)
Contacted patient, she reports she had been on it several years ago but had not had to take it in a long time.  She understands need to be seen.  Appointment made with Dr. Lajuana Ripple on 09/01/17 at 4:30 pm.

## 2017-08-29 NOTE — Telephone Encounter (Signed)
I am unaware of her being on this medication.  Her problem list was reviewed.  I see no history of HSV.  Please have her schedule an appt for evaluation if she needs this medication.

## 2017-09-01 ENCOUNTER — Encounter: Payer: Self-pay | Admitting: Family Medicine

## 2017-09-01 ENCOUNTER — Ambulatory Visit (INDEPENDENT_AMBULATORY_CARE_PROVIDER_SITE_OTHER): Payer: 59 | Admitting: Family Medicine

## 2017-09-01 VITALS — BP 108/70 | HR 65 | Temp 97.4°F | Ht 66.0 in | Wt 212.0 lb

## 2017-09-01 DIAGNOSIS — G8929 Other chronic pain: Secondary | ICD-10-CM | POA: Diagnosis not present

## 2017-09-01 DIAGNOSIS — M25551 Pain in right hip: Secondary | ICD-10-CM | POA: Diagnosis not present

## 2017-09-01 DIAGNOSIS — M5441 Lumbago with sciatica, right side: Secondary | ICD-10-CM

## 2017-09-01 DIAGNOSIS — B001 Herpesviral vesicular dermatitis: Secondary | ICD-10-CM

## 2017-09-01 DIAGNOSIS — M545 Low back pain: Secondary | ICD-10-CM

## 2017-09-01 MED ORDER — KETOROLAC TROMETHAMINE 60 MG/2ML IM SOLN
60.0000 mg | Freq: Once | INTRAMUSCULAR | Status: AC
  Administered 2017-09-01: 60 mg via INTRAMUSCULAR

## 2017-09-01 MED ORDER — MELOXICAM 15 MG PO TABS: 7.5000 mg | ORAL_TABLET | Freq: Every day | ORAL | 0 refills | Status: DC | PRN

## 2017-09-01 MED ORDER — VALACYCLOVIR HCL 1 G PO TABS: 2000.0000 mg | ORAL_TABLET | Freq: Two times a day (BID) | ORAL | 0 refills | Status: AC

## 2017-09-01 NOTE — Patient Instructions (Signed)
You may start the meloxicam tomorrow.  You received a dose of IM Toradol 60 mg today in office.  You have prescribed a nonsteroidal anti-inflammatory drug (NSAID) today. This will help with ear pain and inflammation. Please do not take any other NSAIDs (ibuprofen/Motrin/Advil, naproxen/Aleve, meloxicam/Mobic, Voltaren/diclofenac). Please make sure to eat a meal when taking this medication.   Caution:  If you have a history of acid reflux/indigestion, I recommend that you take an antacid (such as Prilosec, Prevacid) daily while on the NSAID.  If you have a history of bleeding disorder, gastric ulcer, are on a blood thinner (like warfarin/Coumadin, Xarelto, Eliquis, etc) please do not take NSAID.  If you have ever had a heart attack, you should not take NSAIDs.   Cold Sore A cold sore, also called a fever blister, is a skin infection that is caused by a virus. This infection causes small, fluid-filled sores to form inside of the mouth or on the lips, gums, nose, chin, or cheeks. Cold sores can spread to other parts of the body, such as the eyes or fingers. Cold sores can be spread or passed from person to person (contagious) until the sores crust over completely. Cold sores can be spread through close contact, such as kissing or sharing a drinking glass. Follow these instructions at home: Medicines  Take or apply over-the-counter and prescription medicines only as told by your doctor.  Use a cotton-tip swab to apply creams or gels to your sores. Sore Care  Do not touch the sores or pick the scabs.  Wash your hands often. Do not touch your eyes without washing your hands first.  Keep the sores clean and dry.  If directed, apply ice to the sores:  Put ice in a plastic bag.  Place a towel between your skin and the bag.  Leave the ice on for 20 minutes, 2-3 times per day. Lifestyle  Do not kiss, have oral sex, or share personal items until your sores heal.  Eat a soft, bland diet.  Avoid eating hot, cold, or salty foods. These can hurt your mouth.  Use a straw if it hurts to drink out of a glass.  Avoid the sun and limit your stress if these things trigger outbreaks. If sun causes cold sores, apply sunscreen on your lips before being out in the sun. Contact a doctor if:  You have symptoms for more than two weeks.  You have pus coming from the sores.  You have redness that is spreading.  You have pain or irritation in your eye.  You get sores on your genitals.  Your sores do not heal within two weeks.  You get cold sores often. Get help right away if:  You have a fever and your symptoms suddenly get worse.  You have a headache and confusion. This information is not intended to replace advice given to you by your health care provider. Make sure you discuss any questions you have with your health care provider. Document Released: 01/31/2012 Document Revised: 01/07/2016 Document Reviewed: 05/22/2015 Elsevier Interactive Patient Education  [employer].

## 2017-09-01 NOTE — Progress Notes (Signed)
Subjective: CC: Valtrex refill PCP: Janora Norlander, DO WUJ:WJXB [name] is a 52 y.o. female presenting to clinic today for:  1.  Cold sore Patient reports that she has had cold sores several times in the past.  She notes that it is been quite some time though since her last one.  She tends to get them when she is under significant stress or if she had a recent infection like a URI.  She notes that the one presently has been going on for about 1 week.  It is on her left lower lip.She has used Valtrex in the past with good relief.  Currently she is been using Abreva with no improvement.    No history of genitourinary herpes.  2.  Right hip pain/chronic back pain Patient reports onset of acute right hip pain over the last week.  She notes that she has a history of chronic back pain with history of discectomy about 5 years ago.  She sees Dr. Nelva Bush with [facility] orthopedics for nerve blocks.  Her last nerve block was in the fall of last year.  She is due for another and is currently waiting for an appointment with him.  She notes that she is been taking ibuprofen for symptoms.  She notes some relief but not very good relief with the ibuprofen.  She has not been taking more than 800 mg total per day because she was afraid to cause rebound headaches.  She has a history of migraine headaches.  Denies weakness, falls, saddle anesthesia, fecal incontinence or urinary retention.  She has chronic right lower extremity numbness and tingling that is related to her low back.  She does report radiation to the groin but points to the lateral aspect of the right hip as the area of most intense pain.   ROS: Per HPI  Allergies  Allergen Reactions  . Codeine Nausea Only    Confirmed intolerance of codeine verbally with pt in PACU, pt states does not cause any SOB or dyspnea, some nausea only (MN-RN)  . Ultram [Tramadol] Nausea And Vomiting    Ultram allergy also discussed, active N&V occurs with  Ultram  . Latex Itching and Rash   Past Medical History:  Diagnosis Date  . Anxiety    NEW-DUE TO ANXIETY OVER DX OF CANCER  . Blood transfusion 1995  . Cancer (HCC)    RECTAL CANCER  . Constipation    SOME BLOOD IN STOOL  . GERD (gastroesophageal reflux disease)    occasionally-NO MEDS  . Kidney stone   . PONV (postoperative nausea and vomiting)    used a scop patch last surgery-was better    Current Outpatient Medications:  .  amitriptyline (ELAVIL) 25 MG tablet, Take 1 tablet (25 mg total) by mouth at bedtime., Disp: 30 tablet, Rfl: 3 .  esomeprazole (NEXIUM) 40 MG capsule, Take 1 capsule (40 mg total) by mouth daily., Disp: 30 capsule, Rfl: 3 .  estradiol (ESTRACE) 1 MG tablet, TAKE 1 TABLET TWICE A DAY, Disp: 60 tablet, Rfl: 4 .  linaclotide (LINZESS) 290 MCG CAPS capsule, Take 1 capsule (290 mcg total) by mouth daily., Disp: 30 capsule, Rfl: 5 .  rizatriptan (MAXALT) 10 MG tablet, Take 1 tablet (10 mg total) by mouth as needed for migraine. May repeat in 2 hours if needed, Disp: 10 tablet, Rfl: 12 No current facility-administered medications for this visit.   Facility-Administered Medications Ordered in Other Visits:  .  gadopentetate dimeglumine (MAGNEVIST) injection 20  mL, 20 mL, Intravenous, Once PRN, Melvenia Beam, MD Social History   Socioeconomic History  . Marital status: Single    Spouse name: Not on file  . Number of children: 2  . Years of education: Not on file  . Highest education level: Not on file  Social Needs  . Financial resource strain: Not on file  . Food insecurity - worry: Not on file  . Food insecurity - inability: Not on file  . Transportation needs - medical: Not on file  . Transportation needs - non-medical: Not on file  Occupational History  . Occupation: Hotel manager    Employer: [employer]  Tobacco Use  . Smoking status: Never Smoker  . Smokeless tobacco: Never Used  Substance and Sexual Activity  . Alcohol use: Yes      Comment: occasional couple times per mo  . Drug use: No  . Sexual activity: Yes    Birth control/protection: Surgical  Other Topics Concern  . Not on file  Social History Narrative   Lives alone & 2 kids         Family History  Problem Relation Age of Onset  . Other Sister   . Migraines Mother   . Colon cancer Paternal Aunt     Objective: Office vital signs reviewed. BP 108/70   Pulse 65   Temp (!) 97.4 F (36.3 C) (Oral)   Ht 5\' 6"  (1.676 m)   Wt 212 lb (96.2 kg)   BMI 34.22 kg/m   Physical Examination:  General: Awake, alert, overweight, appears uncomfortable Skin: Sore appreciated on the left lower lip near the vermilion border.  Nonbleeding, nonexudative, no evidence of bacterial infection. MSK: Antalgic gait noted.  She has no midline tenderness to palpation.  That she does have right-sided and left-sided paraspinal muscle tenderness to palpation, specifically over the lumbosacral areas.  Pain is more predominant on the right.  She has tenderness to palpation along the greater trochanter on the right.  She has pain with FABER in the right hip.  No pain with FADIR on right.  She has a positive straight leg raise on the right. Neuro: Follows all commands.  Normal toe walk.  Heel walk not performed secondary to pain.  Ambulates independently.  Assessment/ Plan: 52 y.o. female   1. Herpes labialis without complication Valtrex prescribed.  May use 2 g p.o. twice daily times 1 day.  She is no longer undergoing treatment for cancer so dosing for immunocompromise state not prescribed.  Enough for 5 doses provided.  Should she require more than 5 doses per year, could consider suppressive therapy.  Instructions for use reviewed with patient.  She voiced good understanding.  2. Acute right hip pain She has an upcoming appointment for what I suspect will be a corticosteroid injection.  For this reason, oral steroids not provided today.  She was given a dose of Toradol by IM  injection.  I have prescribed her meloxicam to replace ibuprofen.  I did instruct her to use this only if needed and not with other NSAIDs.  Take with food.  Drink plenty of water.  If symptoms do not improve or worsen, please return for reevaluation.  If she is unable to secure an appointment with her orthopedist in a timely manner, will be happy to prescribe oral steroids so long as it does not interfere with their management. - ketorolac (TORADOL) injection 60 mg  3. Chronic bilateral low back pain with right-sided sciatica - ketorolac (  TORADOL) injection 60 mg   Meds ordered this encounter  Medications  . meloxicam (MOBIC) 15 MG tablet    Sig: Take 0.5-1 tablets (7.5-15 mg total) by mouth daily as needed for pain.    Dispense:  7 tablet    Refill:  0  . valACYclovir (VALTREX) 1000 MG tablet    Sig: Take 2 tablets (2,000 mg total) by mouth 2 (two) times daily for 1 day. (as needed for each cold sore outbreak)    Dispense:  20 tablet    Refill:  0  . ketorolac (TORADOL) injection 60 mg     Janora Norlander, DO [place] [phone]

## 2017-09-08 DIAGNOSIS — M5136 Other intervertebral disc degeneration, lumbar region: Secondary | ICD-10-CM | POA: Diagnosis not present

## 2017-09-08 DIAGNOSIS — M47816 Spondylosis without myelopathy or radiculopathy, lumbar region: Secondary | ICD-10-CM | POA: Diagnosis not present

## 2017-10-16 ENCOUNTER — Other Ambulatory Visit: Payer: Self-pay | Admitting: Family Medicine

## 2017-10-20 ENCOUNTER — Other Ambulatory Visit: Payer: Self-pay | Admitting: Family Medicine

## 2017-10-20 NOTE — Telephone Encounter (Signed)
Pt aware this was refilled on 10/16/17 for 3 mos supply She will contact pharmacy again and ask for medication by name instead of by Rx number

## 2017-10-26 ENCOUNTER — Telehealth: Payer: Self-pay | Admitting: Neurology

## 2017-10-26 ENCOUNTER — Encounter (INDEPENDENT_AMBULATORY_CARE_PROVIDER_SITE_OTHER): Payer: Self-pay

## 2017-10-26 ENCOUNTER — Ambulatory Visit: Payer: 59 | Admitting: *Deleted

## 2017-10-26 ENCOUNTER — Encounter: Payer: Self-pay | Admitting: *Deleted

## 2017-10-26 VITALS — BP 111/73 | HR 77

## 2017-10-26 DIAGNOSIS — G43009 Migraine without aura, not intractable, without status migrainosus: Secondary | ICD-10-CM

## 2017-10-26 MED ORDER — METHYLPREDNISOLONE 4 MG PO TBPK: ORAL_TABLET | ORAL | 0 refills | Status: DC

## 2017-10-26 MED ORDER — KETOROLAC TROMETHAMINE 60 MG/2ML IM SOLN
60.0000 mg | Freq: Once | INTRAMUSCULAR | Status: AC
  Administered 2017-10-26: 60 mg via INTRAMUSCULAR

## 2017-10-26 NOTE — Telephone Encounter (Signed)
Pt's had a migraine started on Monday. She took ibuprofren at 1st but it did not help. Rescue med did not help. She is unable to today. She is wanting to be seen today.

## 2017-10-26 NOTE — Progress Notes (Signed)
Pt here for migraine, per Dr. Jaynee Eagles, pt given Toradol 60 mg/ 2 mL injection. Pt tolerated well, bandage applied. Pain score 9.  F/u on pain level, pt a 6/10. Discussed with Dr. Jaynee Eagles. Pt unable to receive DHE due to having Maxalt within last 24 hours. Next step would be nerve block or infusion at 2 pm. Discussed with patient and she reviewed nerve block consent. Pt reported that she is not ready "to go down that road". Informed pt these interventions are possibilities in the future if she changed her mind. She verbalized understanding. She stated that she felt better than when she came in to the office. Pt would like to try a steroid dose pack. She is also aware that if any neurological deficits (including unilateral weakness, facial droop, slurred speech, vision changes, numbness/tingling) occur to proceed to ED. She verbalized appreciation and will update office as needed.   Medrol dose pack 4 mg e-scribed to pt's pharmacy.

## 2017-10-26 NOTE — Telephone Encounter (Signed)
Called patient. She reported that she has had a migraine that is pretty bad. Started out mild, last night her vision was "off balance" and she got sick. It is not a lot better this morning and she was not able to go to work. Maxalt not helping. Pt reports that Toradol shot helped last time. She considered going by PCP since their office is closer.   D/w Dr. Jaynee Eagles, pt can be given Toradol injection today and possibly DHE as well. Can see at 11:15.  D/w pt. She will come at 11:15 for injection. She verbalized appreciation and understanding.

## 2017-10-30 ENCOUNTER — Ambulatory Visit: Payer: 59 | Admitting: Family Medicine

## 2017-11-09 ENCOUNTER — Ambulatory Visit: Payer: 59 | Admitting: Neurology

## 2017-11-10 DIAGNOSIS — M5136 Other intervertebral disc degeneration, lumbar region: Secondary | ICD-10-CM | POA: Diagnosis not present

## 2017-11-10 DIAGNOSIS — M47816 Spondylosis without myelopathy or radiculopathy, lumbar region: Secondary | ICD-10-CM | POA: Diagnosis not present

## 2017-11-24 DIAGNOSIS — M545 Low back pain: Secondary | ICD-10-CM | POA: Diagnosis not present

## 2017-12-05 ENCOUNTER — Ambulatory Visit (INDEPENDENT_AMBULATORY_CARE_PROVIDER_SITE_OTHER): Payer: 59 | Admitting: Family Medicine

## 2017-12-05 ENCOUNTER — Encounter: Payer: Self-pay | Admitting: Family Medicine

## 2017-12-05 VITALS — BP 125/82 | HR 82 | Temp 97.4°F | Ht 66.0 in | Wt 215.8 lb

## 2017-12-05 DIAGNOSIS — L089 Local infection of the skin and subcutaneous tissue, unspecified: Secondary | ICD-10-CM

## 2017-12-05 DIAGNOSIS — L729 Follicular cyst of the skin and subcutaneous tissue, unspecified: Secondary | ICD-10-CM | POA: Diagnosis not present

## 2017-12-05 MED ORDER — DOXYCYCLINE HYCLATE 100 MG PO TABS: 100.0000 mg | ORAL_TABLET | Freq: Two times a day (BID) | ORAL | 0 refills | Status: DC

## 2017-12-05 MED ORDER — FLUCONAZOLE 150 MG PO TABS: 150.0000 mg | ORAL_TABLET | Freq: Once | ORAL | 0 refills | Status: AC

## 2017-12-05 NOTE — Patient Instructions (Signed)
I have placed you on an oral antibiotic to take twice a day for the next 10 days.  Take this with food and plenty of water.  I have placed a referral to general surgery for removal of the lesion on your left inner thigh.  As we discussed, they will likely want you to be on antibiotics for 1 full week prior to removing the cyst.  Continue warm compresses applied to the affected area at least 3 times daily.  Keep area covered so as to reduce friction over the lesion.   Epidermal Cyst An epidermal cyst is sometimes called an epidermal inclusion cyst or an infundibular cyst. It is a sac made of skin tissue. The sac contains a substance called keratin. Keratin is a protein that is normally secreted through the hair follicles. When keratin becomes trapped in the top layer of skin (epidermis), it can form an epidermal cyst. Epidermal cysts are usually found on the face, neck, trunk, and genitals. These cysts are usually harmless (benign), and they may not cause symptoms unless they become infected. It is important not to pop epidermal cysts yourself. What are the causes? This condition may be caused by:  A blocked hair follicle.  A hair that curls and re-enters the skin instead of growing straight out of the skin (ingrown hair).  A blocked pore.  Irritated skin.  An injury to the skin.  Certain conditions that are passed along from parent to child (inherited).  Human papillomavirus (HPV).  What increases the risk? The following factors may make you more likely to develop an epidermal cyst:  Having acne.  Being overweight.  Wearing tight clothing.  What are the signs or symptoms? The only symptom of this condition may be a small, painless lump underneath the skin. When an epidermal cyst becomes infected, symptoms may include:  Redness.  Inflammation.  Tenderness.  Warmth.  Fever.  Keratin draining from the cyst. Keratin may look like a grayish-white, bad-smelling  substance.  Pus draining from the cyst.  How is this diagnosed? This condition is diagnosed with a physical exam. In some cases, you may have a sample of tissue (biopsy) taken from your cyst to be examined under a microscope or tested for bacteria. You may be referred to a health care provider who specializes in skin care (dermatologist). How is this treated? In many cases, epidermal cysts go away on their own without treatment. If a cyst becomes infected, treatment may include:  Opening and draining the cyst. After draining, minor surgery to remove the rest of the cyst may be done.  Antibiotic medicine to help prevent infection.  Injections of medicines (steroids) that help to reduce inflammation.  Surgery to remove the cyst. Surgery may be done if: ? The cyst becomes large. ? The cyst bothers you. ? There is a chance that the cyst could turn into cancer.  Follow these instructions at home:  Take over-the-counter and prescription medicines only as told by your health care provider.  If you were prescribed an antibiotic, use it as told by your health care provider. Do not stop using the antibiotic even if you start to feel better.  Keep the area around your cyst clean and dry.  Wear loose, dry clothing.  Do not try to pop your cyst.  Avoid touching your cyst.  Check your cyst every day for signs of infection.  Keep all follow-up visits as told by your health care provider. This is important. How is this prevented?  Wear clean, dry, clothing.  Avoid wearing tight clothing.  Keep your skin clean and dry. Shower or take baths every day.  Wash your body with a benzoyl peroxide wash when you shower or bathe. Contact a health care provider if:  Your cyst develops symptoms of infection.  Your condition is not improving or is getting worse.  You develop a cyst that looks different from other cysts you have had.  You have a fever. Get help right away if:  Redness  spreads from the cyst into the surrounding area. This information is not intended to replace advice given to you by your health care provider. Make sure you discuss any questions you have with your health care provider. Document Released: 07/02/2004 Document Revised: 03/30/2016 Document Reviewed: 06/03/2015 Elsevier Interactive Patient Education  [employer].

## 2017-12-05 NOTE — Progress Notes (Signed)
Subjective: CC: thigh lesion HPI: [name] is a 52 y.o. female presenting to clinic today for:  1. Thigh lesion Patient reports a 2-week history of a left inner thigh lesion.  She notes that it looks like an abscess.  She has been applying warm compresses 1-2 times daily.  She is used boil relief over-the-counter and attempted to drain it herself.  She notes that it becomes hard and then later become soft.  She denies fevers, chills, extension of the redness.  She notes that she has had a similar lesion in the past and was told that it was a cyst.  She is interested in having this removed if possible, as it seems to be a recurrent issue.  ROS: Per HPI  Past Medical History:  Diagnosis Date  . Anxiety    NEW-DUE TO ANXIETY OVER DX OF CANCER  . Blood transfusion 1995  . Cancer (HCC)    RECTAL CANCER  . Constipation    SOME BLOOD IN STOOL  . GERD (gastroesophageal reflux disease)    occasionally-NO MEDS  . Kidney stone   . PONV (postoperative nausea and vomiting)    used a scop patch last surgery-was better   Allergies  Allergen Reactions  . Codeine Nausea Only    Confirmed intolerance of codeine verbally with pt in PACU, pt states does not cause any SOB or dyspnea, some nausea only (MN-RN)  . Ultram [Tramadol] Nausea And Vomiting    Ultram allergy also discussed, active N&V occurs with Ultram  . Latex Itching and Rash    Current Outpatient Medications:  .  amitriptyline (ELAVIL) 25 MG tablet, Take 1 tablet (25 mg total) by mouth at bedtime., Disp: 30 tablet, Rfl: 3 .  esomeprazole (NEXIUM) 40 MG capsule, TAKE 1 CAPSULE BY MOUTH EVERY DAY, Disp: 90 capsule, Rfl: 0 .  estradiol (ESTRACE) 1 MG tablet, TAKE 1 TABLET TWICE A DAY, Disp: 60 tablet, Rfl: 4 .  linaclotide (LINZESS) 290 MCG CAPS capsule, Take 1 capsule (290 mcg total) by mouth daily. (Patient taking differently: Take 290 mcg by mouth 3 (three) times a week. ), Disp: 30 capsule, Rfl: 5 .   methylPREDNISolone (MEDROL DOSEPAK) 4 MG TBPK tablet, Take as directed. Take all pills in the morning with food., Disp: 21 tablet, Rfl: 0 .  rizatriptan (MAXALT) 10 MG tablet, Take 1 tablet (10 mg total) by mouth as needed for migraine. May repeat in 2 hours if needed, Disp: 10 tablet, Rfl: 12 No current facility-administered medications for this visit.   Facility-Administered Medications Ordered in Other Visits:  .  gadopentetate dimeglumine (MAGNEVIST) injection 20 mL, 20 mL, Intravenous, Once PRN, Melvenia Beam, MD Social History   Socioeconomic History  . Marital status: Single    Spouse name: Not on file  . Number of children: 2  . Years of education: Not on file  . Highest education level: Not on file  Occupational History  . Occupation: Hotel manager    Employer: [employer]  Social Needs  . Financial resource strain: Not on file  . Food insecurity:    Worry: Not on file    Inability: Not on file  . Transportation needs:    Medical: Not on file    Non-medical: Not on file  Tobacco Use  . Smoking status: Never Smoker  . Smokeless tobacco: Never Used  Substance and Sexual Activity  . Alcohol use: Yes    Comment: occasional couple times per mo  . Drug use:  No  . Sexual activity: Yes    Birth control/protection: Surgical  Lifestyle  . Physical activity:    Days per week: Not on file    Minutes per session: Not on file  . Stress: Not on file  Relationships  . Social connections:    Talks on phone: Not on file    Gets together: Not on file    Attends religious service: Not on file    Active member of club or organization: Not on file    Attends meetings of clubs or organizations: Not on file    Relationship status: Not on file  . Intimate partner violence:    Fear of current or ex partner: Not on file    Emotionally abused: Not on file    Physically abused: Not on file    Forced sexual activity: Not on file  Other Topics Concern  . Not on file    Social History Narrative   Lives alone & 2 kids         Family History  Problem Relation Age of Onset  . Other Sister   . Migraines Mother   . Colon cancer Paternal Aunt     Objective: Office vital signs reviewed. BP 125/82   Pulse 82   Temp (!) 97.4 F (36.3 C) (Oral)   Ht 5\' 6"  (1.676 m)   Wt 215 lb 12.8 oz (97.9 kg)   BMI 34.83 kg/m   Physical Examination:  General: Awake, alert, overweight, No acute distress Skin: 1 cm x 1.25 cm area of blanching erythema and inflammation in the inner left posterior thigh.  There is palpable induration.  There is a visible punctum that is actively draining bloody fluid.  Assessment/ Plan: 52 y.o. female   1. Infected cyst of skin Clinically appears to be an infected cyst of the skin.  I have placed her on doxycycline 100 mg p.o. twice daily for the next 10 days.  We discussed that this would likely benefit from surgical excision given its recurrence.  Patient would like the referral to general surgery for excision.  We discussed that likely she will need to be on antibiotics for at least 1 week prior to them assessing her.  She was good understanding.  Home care instructions were reviewed.  I recommend continued compresses to the affected area to promote drainage.  Protect lesion from friction.  Wear loose clothing.  Take antibiotics as directed as above.  Diflucan pill also scented as needed yeast infection.  Follow-up as needed. - Ambulatory referral to [place], [place] [phone]

## 2017-12-06 ENCOUNTER — Other Ambulatory Visit: Payer: Self-pay | Admitting: Neurology

## 2017-12-07 ENCOUNTER — Other Ambulatory Visit: Payer: Self-pay | Admitting: *Deleted

## 2017-12-07 MED ORDER — AMITRIPTYLINE HCL 25 MG PO TABS: ORAL_TABLET | ORAL | 0 refills | Status: DC

## 2017-12-14 ENCOUNTER — Ambulatory Visit: Payer: 59 | Admitting: General Surgery

## 2017-12-14 ENCOUNTER — Encounter: Payer: Self-pay | Admitting: General Surgery

## 2017-12-14 ENCOUNTER — Encounter (INDEPENDENT_AMBULATORY_CARE_PROVIDER_SITE_OTHER): Payer: Self-pay

## 2017-12-14 VITALS — BP 136/80 | HR 82 | Temp 98.2°F | Ht 67.0 in | Wt 214.0 lb

## 2017-12-14 DIAGNOSIS — L723 Sebaceous cyst: Secondary | ICD-10-CM | POA: Diagnosis not present

## 2017-12-14 NOTE — Progress Notes (Signed)
[name]; [id number]; 12-21-1965   HPI Patient is a 52 year old white female who was referred to my care by [name] for evaluation and treatment of a recurrent cyst in the left thigh.  Patient was started on antibiotics 2 weeks ago due to increased swelling of the cyst.  Initial incision and drainage of the cyst was performed previously.  Today is her last day of taking the antibiotic.  The patient states that she has had a known cyst in that area in the past, but it keeps recurring.  She would like formal removal.  She currently has a pain of 3 out of 10. Past Medical History:  Diagnosis Date  . Anxiety    NEW-DUE TO ANXIETY OVER DX OF CANCER  . Blood transfusion 1995  . Cancer (HCC)    RECTAL CANCER  . Constipation    SOME BLOOD IN STOOL  . GERD (gastroesophageal reflux disease)    occasionally-NO MEDS  . Kidney stone   . PONV (postoperative nausea and vomiting)    used a scop patch last surgery-was better    Past Surgical History:  Procedure Laterality Date  . ABDOMINAL HYSTERECTOMY  1995   partial  . BACK SURGERY  2015  . COLON SURGERY    . COLONOSCOPY  06/01/2012   Procedure: COLONOSCOPY;  Surgeon: Danie Binder, MD;  Location: [facility];  Service: Endoscopy;  Laterality: N/A;  1:30PM  . COLONOSCOPY N/A 06/14/2013   MGQ:QPYP diverticulosis/normal anastomosis  . endoscopic left fallopian tube removed  several yrs ago  . EUS  06/06/2012   Procedure: LOWER ENDOSCOPIC ULTRASOUND (EUS);  Surgeon: Arta Silence, MD;  Location: [facility];  Service: Endoscopy;  Laterality: N/A;  . EXAMINATION UNDER ANESTHESIA  09/07/2012   Procedure: EXAM UNDER ANESTHESIA;  Surgeon: Stark Klein, MD;  Location: [facility];  Service: General;  Laterality: N/A;  . EXCISION/RELEASE BURSA HIP  09/15/2011   Dr Tonita Cong EXCISION/RELEASE BURSA HIP;  Surgeon: Johnn Hai, MD;  Location: [facility];  Service: Orthopedics;  Laterality: Left;  Excision of Trochanteric  Bursitis  . FLEXIBLE SIGMOIDOSCOPY N/A 03/22/2013   Procedure: FLEXIBLE SIGMOIDOSCOPY;  Surgeon: Danie Binder, MD;  Location: [facility];  Service: Endoscopy;  Laterality: N/A;  2:00  . LAPAROSCOPIC LOW ANTERIOR RESECTION  07/02/2012   Procedure: LAPAROSCOPIC LOW ANTERIOR RESECTION;  Surgeon: Stark Klein, MD;  Location: [facility];  Service: General;  Laterality: N/A;  . left ankle surgery for fx  several yrs ago  . LUMBAR LAMINECTOMY/DECOMPRESSION MICRODISCECTOMY N/A 05/14/2014   Procedure: LUMBAR DECOMPRESSION L2-L3;  Surgeon: Johnn Hai, MD;  Location: [facility];  Service: Orthopedics;  Laterality: N/A;  . PROCTOSCOPY  09/07/2012   Procedure: PROCTOSCOPY;  Surgeon: Stark Klein, MD;  Location: [place];  Service: General;  Laterality: N/A;    Family History  Problem Relation Age of Onset  . Other Sister   . Migraines Mother   . Colon cancer Paternal Aunt     Current Outpatient Medications on File Prior to Visit  Medication Sig Dispense Refill  . amitriptyline (ELAVIL) 25 MG tablet TAKE 1 TABLET BY MOUTH EVERYDAY AT BEDTIME 90 tablet 0  . doxycycline (VIBRA-TABS) 100 MG tablet Take 1 tablet (100 mg total) by mouth 2 (two) times daily. 20 tablet 0  . esomeprazole (NEXIUM) 40 MG capsule TAKE 1 CAPSULE BY MOUTH EVERY DAY 90 capsule 0  . estradiol (ESTRACE) 1 MG tablet TAKE 1 TABLET TWICE A DAY 60  tablet 4  . linaclotide (LINZESS) 290 MCG CAPS capsule Take 1 capsule (290 mcg total) by mouth daily. (Patient taking differently: Take 290 mcg by mouth 3 (three) times a week. ) 30 capsule 5  . rizatriptan (MAXALT) 10 MG tablet Take 1 tablet (10 mg total) by mouth as needed for migraine. May repeat in 2 hours if needed 10 tablet 12   Current Facility-Administered Medications on File Prior to Visit  Medication Dose Route Frequency Provider Last Rate Last Dose  . gadopentetate dimeglumine (MAGNEVIST) injection 20 mL  20 mL Intravenous Once PRN Melvenia Beam, MD         Allergies  Allergen Reactions  . Codeine Nausea Only    Confirmed intolerance of codeine verbally with pt in PACU, pt states does not cause any SOB or dyspnea, some nausea only (MN-RN)  . Ultram [Tramadol] Nausea And Vomiting    Ultram allergy also discussed, active N&V occurs with Ultram  . Latex Itching and Rash    Social History   Substance and Sexual Activity  Alcohol Use Yes   Comment: occasional couple times per mo    Social History   Tobacco Use  Smoking Status Never Smoker  Smokeless Tobacco Never Used    Review of Systems  Constitutional: Negative.   HENT: Negative.   Eyes: Negative.   Respiratory: Negative.   Cardiovascular: Negative.   Gastrointestinal: Negative.   Genitourinary: Negative.   Musculoskeletal: Positive for back pain.  Skin: Negative.   Neurological: Negative.   Endo/Heme/Allergies: Negative.   Psychiatric/Behavioral: Negative.     Objective   Vitals:   12/14/17 1125  BP: 136/80  Pulse: 82  Temp: 98.2 F (36.8 C)    Physical Exam  Constitutional: She is oriented to person, place, and time. She appears well-developed and well-nourished.  HENT:  Head: Normocephalic and atraumatic.  Cardiovascular: Normal rate, regular rhythm and normal heart sounds. Exam reveals no gallop and no friction rub.  No murmur heard. Pulmonary/Chest: Effort normal and breath sounds normal. No stridor. No respiratory distress. She has no wheezes. She has no rales.  Neurological: She is alert and oriented to person, place, and time.  Skin: Skin is warm and dry.  2 cm ovoid, cystic mass present in the left upper inner thigh.  Mild erythema is noted.  No drainage is noted.  Mild induration noted.  Vitals reviewed. PCP notes reviewed.  Assessment  Sebaceous cyst, left thigh, recurrent, 2 cm Plan   Patient is scheduled for excision of the sebaceous cyst, left thigh on 12/29/2017.  The risks and benefits of the procedure including bleeding, infection, and  recurrence of the cyst were fully explained to the patient, who gave informed consent.

## 2017-12-14 NOTE — Patient Instructions (Signed)
Epidermal Cyst An epidermal cyst is a small, painless lump under your skin. It may be called an epidermal inclusion cyst or an infundibular cyst. The cyst contains a grayish-white, bad-smelling substance (keratin). It is important not to pop epidermal cysts yourself. These cysts are usually harmless (benign), but they can get infected. Symptoms of infection may include:  Redness.  Inflammation.  Tenderness.  Warmth.  Fever.  A grayish-white, bad-smelling substance draining from the cyst.  Pus draining from the cyst.  Follow these instructions at home:  Take over-the-counter and prescription medicines only as told by your doctor.  If you were prescribed an antibiotic, use it as told by your doctor. Do not stop using the antibiotic even if you start to feel better.  Keep the area around your cyst clean and dry.  Wear loose, dry clothing.  Do not try to pop your cyst.  Avoid touching your cyst.  Check your cyst every day for signs of infection.  Keep all follow-up visits as told by your doctor. This is important. How is this prevented?  Wear clean, dry, clothing.  Avoid wearing tight clothing.  Keep your skin clean and dry. Shower or take baths every day.  Wash your body with a benzoyl peroxide wash when you shower or bathe. Contact a health care provider if:  Your cyst has symptoms of infection.  Your condition is not improving or is getting worse.  You have a cyst that looks different from other cysts you have had.  You have a fever. Get help right away if:  Redness spreads from the cyst into the surrounding area. This information is not intended to replace advice given to you by your health care provider. Make sure you discuss any questions you have with your health care provider. Document Released: 09/08/2004 Document Revised: 03/30/2016 Document Reviewed: 06/03/2015 Elsevier Interactive Patient Education  2018 [employer]  

## 2017-12-16 NOTE — H&P (Signed)
[name]; [id number]; Nov 29, 1965   HPI Patient is a 52 year old white female who was referred to my care by [name] for evaluation and treatment of a recurrent cyst in the left thigh.  Patient was started on antibiotics 2 weeks ago due to increased swelling of the cyst.  Initial incision and drainage of the cyst was performed previously.  Today is her last day of taking the antibiotic.  The patient states that she has had a known cyst in that area in the past, but it keeps recurring.  She would like formal removal.  She currently has a pain of 3 out of 10. Past Medical History:  Diagnosis Date  . Anxiety    NEW-DUE TO ANXIETY OVER DX OF CANCER  . Blood transfusion 1995  . Cancer (HCC)    RECTAL CANCER  . Constipation    SOME BLOOD IN STOOL  . GERD (gastroesophageal reflux disease)    occasionally-NO MEDS  . Kidney stone   . PONV (postoperative nausea and vomiting)    used a scop patch last surgery-was better    Past Surgical History:  Procedure Laterality Date  . ABDOMINAL HYSTERECTOMY  1995   partial  . BACK SURGERY  2015  . COLON SURGERY    . COLONOSCOPY  06/01/2012   Procedure: COLONOSCOPY;  Surgeon: Lisa Binder, MD;  [facility]: [facility];  Service: Endoscopy;  Laterality: N/A;  1:30PM  . COLONOSCOPY N/A 06/14/2013   [id number] diverticulosis/normal anastomosis  . endoscopic left fallopian tube removed  several yrs ago  . EUS  06/06/2012   Procedure: LOWER ENDOSCOPIC ULTRASOUND (EUS);  Surgeon: Lisa Silence, MD;  Location: [facility];  Service: Endoscopy;  Laterality: N/A;  . EXAMINATION UNDER ANESTHESIA  09/07/2012   Procedure: EXAM UNDER ANESTHESIA;  Surgeon: Lisa Klein, MD;  Location: [place];  Service: General;  Laterality: N/A;  . EXCISION/RELEASE BURSA HIP  09/15/2011   Dr Tonita Cong EXCISION/RELEASE BURSA HIP;  Surgeon: Lisa Hai, MD;  Location: [facility];  Service: Orthopedics;  Laterality: Left;  Excision of Trochanteric  Bursitis  . FLEXIBLE SIGMOIDOSCOPY N/A 03/22/2013   Procedure: FLEXIBLE SIGMOIDOSCOPY;  Surgeon: Lisa Binder, MD;  [facility];  Service: Endoscopy;  Laterality: N/A;  2:00  . LAPAROSCOPIC LOW ANTERIOR RESECTION  07/02/2012   Procedure: LAPAROSCOPIC LOW ANTERIOR RESECTION;  Surgeon: Lisa Klein, MD;  Location: [facility];  Service: General;  Laterality: N/A;  . left ankle surgery for fx  several yrs ago  . LUMBAR LAMINECTOMY/DECOMPRESSION MICRODISCECTOMY N/A 05/14/2014   Procedure: LUMBAR DECOMPRESSION L2-L3;  Surgeon: Lisa Hai, MD;  Location: [facility];  Service: Orthopedics;  Laterality: N/A;  . PROCTOSCOPY  09/07/2012   Procedure: PROCTOSCOPY;  Surgeon: Lisa Klein, MD;  Location: [place];  Service: General;  Laterality: N/A;    Family History  Problem Relation Age of Onset  . Other Sister   . Migraines Mother   . Colon cancer Paternal Aunt     Current Outpatient Medications on File Prior to Visit  Medication Sig Dispense Refill  . amitriptyline (ELAVIL) 25 MG tablet TAKE 1 TABLET BY MOUTH EVERYDAY AT BEDTIME 90 tablet 0  . doxycycline (VIBRA-TABS) 100 MG tablet Take 1 tablet (100 mg total) by mouth 2 (two) times daily. 20 tablet 0  . esomeprazole (NEXIUM) 40 MG capsule TAKE 1 CAPSULE BY MOUTH EVERY DAY 90 capsule 0  . estradiol (ESTRACE) 1 MG tablet TAKE 1 TABLET TWICE A DAY 60  tablet 4  . linaclotide (LINZESS) 290 MCG CAPS capsule Take 1 capsule (290 mcg total) by mouth daily. (Patient taking differently: Take 290 mcg by mouth 3 (three) times a week. ) 30 capsule 5  . rizatriptan (MAXALT) 10 MG tablet Take 1 tablet (10 mg total) by mouth as needed for migraine. May repeat in 2 hours if needed 10 tablet 12   Current Facility-Administered Medications on File Prior to Visit  Medication Dose Route Frequency Provider Last Rate Last Dose  . gadopentetate dimeglumine (MAGNEVIST) injection 20 mL  20 mL Intravenous Once PRN Lisa Beam, MD         Allergies  Allergen Reactions  . Codeine Nausea Only    Confirmed intolerance of codeine verbally with pt in PACU, pt states does not cause any SOB or dyspnea, some nausea only (MN-RN)  . Ultram [Tramadol] Nausea And Vomiting    Ultram allergy also discussed, active N&V occurs with Ultram  . Latex Itching and Rash    Social History   Substance and Sexual Activity  Alcohol Use Yes   Comment: occasional couple times per mo    Social History   Tobacco Use  Smoking Status Never Smoker  Smokeless Tobacco Never Used    Review of Systems  Constitutional: Negative.   HENT: Negative.   Eyes: Negative.   Respiratory: Negative.   Cardiovascular: Negative.   Gastrointestinal: Negative.   Genitourinary: Negative.   Musculoskeletal: Positive for back pain.  Skin: Negative.   Neurological: Negative.   Endo/Heme/Allergies: Negative.   Psychiatric/Behavioral: Negative.     Objective   Vitals:   12/14/17 1125  BP: 136/80  Pulse: 82  Temp: 98.2 F (36.8 C)    Physical Exam  Constitutional: She is oriented to person, place, and time. She appears well-developed and well-nourished.  HENT:  Head: Normocephalic and atraumatic.  Cardiovascular: Normal rate, regular rhythm and normal heart sounds. Exam reveals no gallop and no friction rub.  No murmur heard. Pulmonary/Chest: Effort normal and breath sounds normal. No stridor. No respiratory distress. She has no wheezes. She has no rales.  Neurological: She is alert and oriented to person, place, and time.  Skin: Skin is warm and dry.  2 cm ovoid, cystic mass present in the left upper inner thigh.  Mild erythema is noted.  No drainage is noted.  Mild induration noted.  Vitals reviewed. PCP notes reviewed.  Assessment  Sebaceous cyst, left thigh, recurrent, 2 cm Plan   Patient is scheduled for excision of the sebaceous cyst, left thigh on 12/29/2017.  The risks and benefits of the procedure including bleeding, infection, and  recurrence of the cyst were fully explained to the patient, who gave informed consent.

## 2017-12-21 ENCOUNTER — Ambulatory Visit: Payer: 59 | Admitting: Neurology

## 2017-12-26 ENCOUNTER — Other Ambulatory Visit (HOSPITAL_COMMUNITY): Payer: Self-pay

## 2017-12-29 ENCOUNTER — Ambulatory Visit: Payer: 59 | Admitting: Neurology

## 2018-01-09 ENCOUNTER — Other Ambulatory Visit (HOSPITAL_COMMUNITY): Payer: Self-pay

## 2018-01-11 ENCOUNTER — Other Ambulatory Visit: Payer: Self-pay | Admitting: Family Medicine

## 2018-01-22 DIAGNOSIS — M25552 Pain in left hip: Secondary | ICD-10-CM | POA: Diagnosis not present

## 2018-01-23 NOTE — Patient Instructions (Signed)
[name]  01/23/2018     @PREFPERIOPPHARMACY @   Your procedure is scheduled on   02/02/2018   Report to [facility] at  615  A.M.  Call this number if you have problems the morning of surgery:  [phone]   Remember:  Do not eat or drink after midnight.  You may drink clear liquids until 12 midnight 02/01/2018  .  Clear liquids allowed are:                    Water, Juice (non-citric and without pulp), Carbonated beverages, Clear Tea, Black Coffee only, Plain Jell-O only, Gatorade and Plain Popsicles only    Take these medicines the morning of surgery with A SIP OF WATER  Nexium, oxycodone, maxalt.    Do not wear jewelry, make-up or nail polish.  Do not wear lotions, powders, or perfumes, or deodorant.  Do not shave 48 hours prior to surgery.  Men may shave face and neck.  Do not bring valuables to the hospital.  [facility] is not responsible for any belongings or valuables.  Contacts, dentures or bridgework may not be worn into surgery.  Leave your suitcase in the car.  After surgery it may be brought to your room.  For patients admitted to the hospital, discharge time will be determined by your treatment team.  Patients discharged the day of surgery will not be allowed to drive home.   Name and phone number of your driver:   family Special instructions:  None  Please read over the following fact sheets that you were given. Anesthesia Post-op Instructions and Care and Recovery After Surgery      Epidermal Cyst Removal Epidermal cyst removal is a procedure to remove a sac of oily material that forms under your skin (epidermal cyst). Epidermal cysts may also be called epidermoid cysts or keratin cysts. Normally, the skin secretes this oily material through a gland or a hair follicle. This type of cyst usually results when a skin gland or hair follicle becomes blocked. You may need this procedure if you have an epidermal cyst that becomes large,  uncomfortable, or infected. Tell a health care provider about:  Any allergies you have.  All medicines you are taking, including vitamins, herbs, eye drops, creams, and over-the-counter medicines.  Any problems you or family members have had with anesthetic medicines.  Any blood disorders you have.  Any surgeries you have had.  Any medical conditions you have. What are the risks? Generally, this is a safe procedure. However, problems may occur, including:  Developing another cyst.  Bleeding.  Infection.  Scarring.  What happens before the procedure?  Ask your health care provider about: ? Changing or stopping your regular medicines. This is especially important if you are taking diabetes medicines or blood thinners. ? Taking medicines such as aspirin and ibuprofen. These medicines can thin your blood. Do not take these medicines before your procedure if your health care provider instructs you not to.  If you have an infected cyst, you may have to take antibiotic medicines before or after the cyst removal. Take your antibiotics as directed by your health care provider. Finish all of the medicine even if you start to feel better.  Take a shower on the morning of your procedure. Your health care provider may ask you to use a germ-killing (antiseptic) soap. What happens during the procedure?  You will be given a  medicine that numbs the area (local anesthetic).  The skin around the cyst will be cleaned with a germ-killing solution (antiseptic).  Your health care provider will make a small surgical incision over the cyst.  The cyst will be separated from the surrounding tissues that are under your skin.  If possible, the cyst will be removed undamaged (intact).  If the cyst bursts (ruptures), it will need to be removed in pieces.  After the cyst is removed, your health care provider will control any bleeding and close the incision with small stitches (sutures). Small  incisions may not need sutures, and the bleeding will be controlled by applying direct pressure with gauze.  Your health care provider may apply antibiotic ointment and a light bandage (dressing) over the incision. This procedure may vary among health care providers and hospitals. What happens after the procedure?  If your cyst ruptured during surgery, you may need to take antibiotic medicine. If you were prescribed an antibiotic medicine, finish all of it even if you start to feel better. This information is not intended to replace advice given to you by your health care provider. Make sure you discuss any questions you have with your health care provider. Document Released: 07/29/2000 Document Revised: 01/07/2016 Document Reviewed: 04/16/2014 Elsevier Interactive Patient Education  2018 [place]. Epidermal Cyst Removal, Care After Refer to this sheet in the next few weeks. These instructions provide you with information about caring for yourself after your procedure. Your health care provider may also give you more specific instructions. Your treatment has been planned according to current medical practices, but problems sometimes occur. Call your health care provider if you have any problems or questions after your procedure. What can I expect after the procedure? After the procedure, it is common to have:  Soreness in the area where your cyst was removed.  Tightness or itching from your skin sutures.  Follow these instructions at home:  Take medicines only as directed by your health care provider.  If you were prescribed an antibiotic medicine, finish all of it even if you start to feel better.  Use antibiotic ointment as directed by your health care provider. Follow the instructions carefully.  There are many different ways to close and cover an incision, including stitches (sutures), skin glue, and adhesive strips. Follow your health care provider's instructions  about: ? Incision care. ? Bandage (dressing) changes and removal. ? Incision closure removal.  Keep the bandage (dressing) dry until your health care provider says that it can be removed. Take sponge baths only. Ask your health care provider when you can start showering or taking a bath.  After your dressing is off, check your incision every day for signs of infection. Watch for: ? Redness, swelling, or pain. ? Fluid, blood, or pus.  You can return to your normal activities. Do not do anything that stretches or puts pressure on your incision.  You can return to your normal diet.  Keep all follow-up visits as directed by your health care provider. This is important. Contact a health care provider if:  You have a fever.  Your incision bleeds.  You have redness, swelling, or pain in the incision area.  You have fluid, blood, or pus coming from your incision.  Your cyst comes back after surgery. This information is not intended to replace advice given to you by your health care provider. Make sure you discuss any questions you have with your health care provider. Document Released: 08/22/2014 Document Revised:  01/07/2016 Document Reviewed: 04/16/2014 Elsevier Interactive Patient Education  2018 [place] Anesthesia is a term that refers to techniques, procedures, and medicines that help a person stay safe and comfortable during a medical procedure. Monitored anesthesia care, or sedation, is one type of anesthesia. Your anesthesia specialist may recommend sedation if you will be having a procedure that does not require you to be unconscious, such as:  Cataract surgery.  A dental procedure.  A biopsy.  A colonoscopy.  During the procedure, you may receive a medicine to help you relax (sedative). There are three levels of sedation:  Mild sedation. At this level, you may feel awake and relaxed. You will be able to follow directions.  Moderate  sedation. At this level, you will be sleepy. You may not remember the procedure.  Deep sedation. At this level, you will be asleep. You will not remember the procedure.  The more medicine you are given, the deeper your level of sedation will be. Depending on how you respond to the procedure, the anesthesia specialist may change your level of sedation or the type of anesthesia to fit your needs. An anesthesia specialist will monitor you closely during the procedure. Let your health care provider know about:  Any allergies you have.  All medicines you are taking, including vitamins, herbs, eye drops, creams, and over-the-counter medicines.  Any use of steroids (by mouth or as a cream).  Any problems you or family members have had with sedatives and anesthetic medicines.  Any blood disorders you have.  Any surgeries you have had.  Any medical conditions you have, such as sleep apnea.  Whether you are pregnant or may be pregnant.  Any use of cigarettes, alcohol, or street drugs. What are the risks? Generally, this is a safe procedure. However, problems may occur, including:  Getting too much medicine (oversedation).  Nausea.  Allergic reaction to medicines.  Trouble breathing. If this happens, a breathing tube may be used to help with breathing. It will be removed when you are awake and breathing on your own.  Heart trouble.  Lung trouble.  Before the procedure Staying hydrated Follow instructions from your health care provider about hydration, which may include:  Up to 2 hours before the procedure - you may continue to drink clear liquids, such as water, clear fruit juice, black coffee, and plain tea.  Eating and drinking restrictions Follow instructions from your health care provider about eating and drinking, which may include:  8 hours before the procedure - stop eating heavy meals or foods such as meat, fried foods, or fatty foods.  6 hours before the procedure -  stop eating light meals or foods, such as toast or cereal.  6 hours before the procedure - stop drinking milk or drinks that contain milk.  2 hours before the procedure - stop drinking clear liquids.  Medicines Ask your health care provider about:  Changing or stopping your regular medicines. This is especially important if you are taking diabetes medicines or blood thinners.  Taking medicines such as aspirin and ibuprofen. These medicines can thin your blood. Do not take these medicines before your procedure if your health care provider instructs you not to.  Tests and exams  You will have a physical exam.  You may have blood tests done to show: ? How well your kidneys and liver are working. ? How well your blood can clot.  General instructions  Plan to have someone take you home from  the hospital or clinic.  If you will be going home right after the procedure, plan to have someone with you for 24 hours.  What happens during the procedure?  Your blood pressure, heart rate, breathing, level of pain and overall condition will be monitored.  An IV tube will be inserted into one of your veins.  Your anesthesia specialist will give you medicines as needed to keep you comfortable during the procedure. This may mean changing the level of sedation.  The procedure will be performed. After the procedure  Your blood pressure, heart rate, breathing rate, and blood oxygen level will be monitored until the medicines you were given have worn off.  Do not drive for 24 hours if you received a sedative.  You may: ? Feel sleepy, clumsy, or nauseous. ? Feel forgetful about what happened after the procedure. ? Have a sore throat if you had a breathing tube during the procedure. ? Vomit. This information is not intended to replace advice given to you by your health care provider. Make sure you discuss any questions you have with your health care provider. Document Released: 04/27/2005  Document Revised: 01/08/2016 Document Reviewed: 11/22/2015 Elsevier Interactive Patient Education  2018 [place], Care After These instructions provide you with information about caring for yourself after your procedure. Your health care provider may also give you more specific instructions. Your treatment has been planned according to current medical practices, but problems sometimes occur. Call your health care provider if you have any problems or questions after your procedure. What can I expect after the procedure? After your procedure, it is common to:  Feel sleepy for several hours.  Feel clumsy and have poor balance for several hours.  Feel forgetful about what happened after the procedure.  Have poor judgment for several hours.  Feel nauseous or vomit.  Have a sore throat if you had a breathing tube during the procedure.  Follow these instructions at home: For at least 24 hours after the procedure:   Do not: ? Participate in activities in which you could fall or become injured. ? Drive. ? Use heavy machinery. ? Drink alcohol. ? Take sleeping pills or medicines that cause drowsiness. ? Make important decisions or sign legal documents. ? Take care of children on your own.  Rest. Eating and drinking  Follow the diet that is recommended by your health care provider.  If you vomit, drink water, juice, or soup when you can drink without vomiting.  Make sure you have little or no nausea before eating solid foods. General instructions  Have a responsible adult stay with you until you are awake and alert.  Take over-the-counter and prescription medicines only as told by your health care provider.  If you smoke, do not smoke without supervision.  Keep all follow-up visits as told by your health care provider. This is important. Contact a health care provider if:  You keep feeling nauseous or you keep vomiting.  You feel  light-headed.  You develop a rash.  You have a fever. Get help right away if:  You have trouble breathing. This information is not intended to replace advice given to you by your health care provider. Make sure you discuss any questions you have with your health care provider. Document Released: 11/22/2015 Document Revised: 03/23/2016 Document Reviewed: 11/22/2015 Elsevier Interactive Patient Education  [employer].

## 2018-01-26 ENCOUNTER — Encounter (HOSPITAL_COMMUNITY): Payer: Self-pay

## 2018-01-26 ENCOUNTER — Encounter (HOSPITAL_COMMUNITY)
Admission: RE | Admit: 2018-01-26 | Discharge: 2018-01-26 | Disposition: A | Discharge status: Home / Self Care | Payer: 59 | Source: Ambulatory Visit | Attending: General Surgery | Admitting: General Surgery

## 2018-01-26 ENCOUNTER — Other Ambulatory Visit: Payer: Self-pay

## 2018-01-26 DIAGNOSIS — Z01810 Encounter for preprocedural cardiovascular examination: Secondary | ICD-10-CM | POA: Insufficient documentation

## 2018-01-26 DIAGNOSIS — Z01812 Encounter for preprocedural laboratory examination: Secondary | ICD-10-CM | POA: Diagnosis not present

## 2018-01-26 HISTORY — DX: Personal history of urinary calculi: Z87.442

## 2018-01-26 LAB — CBC WITH DIFFERENTIAL/PLATELET
Basophils Absolute: 0 10*3/uL (ref 0.0–0.1)
Basophils Relative: 0 %
EOS PCT: 2 %
Eosinophils Absolute: 0.1 10*3/uL (ref 0.0–0.7)
HCT: 41.2 % (ref 36.0–46.0)
Hemoglobin: 13.4 g/dL (ref 12.0–15.0)
LYMPHS ABS: 3 10*3/uL (ref 0.7–4.0)
Lymphocytes Relative: 43 %
MCH: 30.7 pg (ref 26.0–34.0)
MCHC: 32.5 g/dL (ref 30.0–36.0)
MCV: 94.3 fL (ref 78.0–100.0)
MONO ABS: 0.5 10*3/uL (ref 0.1–1.0)
MONOS PCT: 7 %
Neutro Abs: 3.3 10*3/uL (ref 1.7–7.7)
Neutrophils Relative %: 48 %
PLATELETS: 224 10*3/uL (ref 150–400)
RBC: 4.37 MIL/uL (ref 3.87–5.11)
RDW: 13 % (ref 11.5–15.5)
WBC: 6.9 10*3/uL (ref 4.0–10.5)

## 2018-01-26 LAB — BASIC METABOLIC PANEL
Anion gap: 8 (ref 5–15)
BUN: 14 mg/dL (ref 6–20)
CALCIUM: 8.7 mg/dL — AB (ref 8.9–10.3)
CO2: 26 mmol/L (ref 22–32)
Chloride: 106 mmol/L (ref 101–111)
Creatinine, Ser: 0.8 mg/dL (ref 0.44–1.00)
GFR calc Af Amer: >60 mL/min (ref >60)
GLUCOSE: 103 mg/dL — AB (ref 65–99)
Potassium: 3.3 mmol/L — ABNORMAL LOW (ref 3.5–5.1)
Sodium: 140 mmol/L (ref 135–145)

## 2018-02-02 ENCOUNTER — Ambulatory Visit (HOSPITAL_COMMUNITY)
Admission: RE | Admit: 2018-02-02 | Discharge: 2018-02-02 | Disposition: A | Discharge status: Home / Self Care | Payer: 59 | Source: Ambulatory Visit | Attending: General Surgery | Admitting: General Surgery

## 2018-02-02 ENCOUNTER — Encounter (HOSPITAL_COMMUNITY)
Admission: RE | Disposition: A | Discharge status: Home / Self Care | Payer: Self-pay | Source: Ambulatory Visit | Attending: General Surgery

## 2018-02-02 ENCOUNTER — Ambulatory Visit (HOSPITAL_COMMUNITY): Payer: 59 | Admitting: Anesthesiology

## 2018-02-02 DIAGNOSIS — Z79890 Hormone replacement therapy (postmenopausal): Secondary | ICD-10-CM | POA: Diagnosis not present

## 2018-02-02 DIAGNOSIS — L723 Sebaceous cyst: Secondary | ICD-10-CM

## 2018-02-02 DIAGNOSIS — Z79899 Other long term (current) drug therapy: Secondary | ICD-10-CM | POA: Insufficient documentation

## 2018-02-02 DIAGNOSIS — Z85048 Personal history of other malignant neoplasm of rectum, rectosigmoid junction, and anus: Secondary | ICD-10-CM | POA: Insufficient documentation

## 2018-02-02 DIAGNOSIS — K219 Gastro-esophageal reflux disease without esophagitis: Secondary | ICD-10-CM | POA: Diagnosis not present

## 2018-02-02 DIAGNOSIS — F419 Anxiety disorder, unspecified: Secondary | ICD-10-CM | POA: Insufficient documentation

## 2018-02-02 DIAGNOSIS — L729 Follicular cyst of the skin and subcutaneous tissue, unspecified: Secondary | ICD-10-CM | POA: Diagnosis not present

## 2018-02-02 HISTORY — PX: MASS EXCISION: SHX2000

## 2018-02-02 SURGERY — EXCISION MASS
Anesthesia: General | Laterality: Left

## 2018-02-02 MED ORDER — FENTANYL CITRATE (PF) 100 MCG/2ML IJ SOLN: 25.0000 ug | INTRAMUSCULAR | Status: DC | PRN

## 2018-02-02 MED ORDER — BUPIVACAINE HCL (PF) 0.5 % IJ SOLN
INTRAMUSCULAR | Status: AC
  Filled 2018-02-02: qty 30

## 2018-02-02 MED ORDER — KETOROLAC TROMETHAMINE 30 MG/ML IJ SOLN
30.0000 mg | Freq: Once | INTRAMUSCULAR | Status: AC
  Administered 2018-02-02: 30 mg via INTRAVENOUS

## 2018-02-02 MED ORDER — MIDAZOLAM HCL 2 MG/2ML IJ SOLN
INTRAMUSCULAR | Status: AC
  Filled 2018-02-02: qty 2

## 2018-02-02 MED ORDER — BUPIVACAINE HCL (PF) 0.5 % IJ SOLN
INTRAMUSCULAR | Status: DC | PRN
  Administered 2018-02-02: 8 mL

## 2018-02-02 MED ORDER — CEFAZOLIN SODIUM-DEXTROSE 2-4 GM/100ML-% IV SOLN
2.0000 g | INTRAVENOUS | Status: AC
  Administered 2018-02-02: 2 g via INTRAVENOUS
  Filled 2018-02-02: qty 100

## 2018-02-02 MED ORDER — CHLORHEXIDINE GLUCONATE CLOTH 2 % EX PADS: 6.0000 | MEDICATED_PAD | Freq: Once | CUTANEOUS | Status: DC

## 2018-02-02 MED ORDER — SCOPOLAMINE 1 MG/3DAYS TD PT72
MEDICATED_PATCH | TRANSDERMAL | Status: AC
Start: 2018-02-02 — End: ?
  Filled 2018-02-02: qty 1

## 2018-02-02 MED ORDER — FENTANYL CITRATE (PF) 100 MCG/2ML IJ SOLN
INTRAMUSCULAR | Status: AC
  Filled 2018-02-02: qty 4

## 2018-02-02 MED ORDER — PROPOFOL 10 MG/ML IV BOLUS
INTRAVENOUS | Status: AC
  Filled 2018-02-02: qty 40

## 2018-02-02 MED ORDER — PROPOFOL 10 MG/ML IV BOLUS
INTRAVENOUS | Status: DC | PRN
  Administered 2018-02-02: 150 mg via INTRAVENOUS

## 2018-02-02 MED ORDER — FENTANYL CITRATE (PF) 100 MCG/2ML IJ SOLN
INTRAMUSCULAR | Status: DC | PRN
  Administered 2018-02-02 (×2): 50 ug via INTRAVENOUS

## 2018-02-02 MED ORDER — LIDOCAINE HCL (PF) 1 % IJ SOLN
INTRAMUSCULAR | Status: AC
Start: 2018-02-02 — End: ?
  Filled 2018-02-02: qty 5

## 2018-02-02 MED ORDER — LACTATED RINGERS IV SOLN
INTRAVENOUS | Status: DC
  Administered 2018-02-02: 07:00:00 via INTRAVENOUS

## 2018-02-02 MED ORDER — LIDOCAINE HCL 1 % IJ SOLN
INTRAMUSCULAR | Status: DC | PRN
  Administered 2018-02-02: 25 mg via INTRADERMAL

## 2018-02-02 MED ORDER — 0.9 % SODIUM CHLORIDE (POUR BTL) OPTIME
TOPICAL | Status: DC | PRN
  Administered 2018-02-02: 1000 mL

## 2018-02-02 MED ORDER — MIDAZOLAM HCL 5 MG/5ML IJ SOLN
INTRAMUSCULAR | Status: DC | PRN
  Administered 2018-02-02: 2 mg via INTRAVENOUS

## 2018-02-02 MED ORDER — ONDANSETRON HCL 4 MG/2ML IJ SOLN
INTRAMUSCULAR | Status: DC | PRN
  Administered 2018-02-02: 4 mg via INTRAVENOUS

## 2018-02-02 MED ORDER — SCOPOLAMINE 1 MG/3DAYS TD PT72
1.0000 | MEDICATED_PATCH | TRANSDERMAL | Status: DC
  Administered 2018-02-02: 1.5 mg via TRANSDERMAL

## 2018-02-02 MED ORDER — KETOROLAC TROMETHAMINE 30 MG/ML IJ SOLN
INTRAMUSCULAR | Status: AC
  Filled 2018-02-02: qty 1

## 2018-02-02 SURGICAL SUPPLY — 32 items
ADH SKN CLS APL DERMABOND .7 (GAUZE/BANDAGES/DRESSINGS) ×1
CLOTH BEACON ORANGE TIMEOUT ST (SAFETY) ×2 IMPLANT
COVER LIGHT HANDLE STERIS (MISCELLANEOUS) ×4 IMPLANT
COVER MAYO STAND XLG (DRAPE) ×2 IMPLANT
DECANTER SPIKE VIAL GLASS SM (MISCELLANEOUS) ×2 IMPLANT
DERMABOND ADVANCED (GAUZE/BANDAGES/DRESSINGS) ×1
DERMABOND ADVANCED .7 DNX12 (GAUZE/BANDAGES/DRESSINGS) ×1 IMPLANT
ELECT REM PT RETURN 9FT ADLT (ELECTROSURGICAL) ×2
ELECTRODE REM PT RTRN 9FT ADLT (ELECTROSURGICAL) ×1 IMPLANT
GAUZE SPONGE 4X4 12PLY STRL (GAUZE/BANDAGES/DRESSINGS) ×2 IMPLANT
GLOVE BIOGEL PI IND STRL 6.5 (GLOVE) ×1 IMPLANT
GLOVE BIOGEL PI IND STRL 7.0 (GLOVE) ×1 IMPLANT
GLOVE BIOGEL PI INDICATOR 6.5 (GLOVE) ×1
GLOVE BIOGEL PI INDICATOR 7.0 (GLOVE) ×1
GLOVE SURG SS PI 6.5 STRL IVOR (GLOVE) ×2 IMPLANT
GLOVE SURG SS PI 7.5 STRL IVOR (GLOVE) ×2 IMPLANT
GOWN STRL REUS W/ TWL XL LVL3 (GOWN DISPOSABLE) ×1 IMPLANT
GOWN STRL REUS W/TWL LRG LVL3 (GOWN DISPOSABLE) ×2 IMPLANT
GOWN STRL REUS W/TWL XL LVL3 (GOWN DISPOSABLE) ×2
KIT TURNOVER KIT A (KITS) ×2 IMPLANT
MANIFOLD NEPTUNE II (INSTRUMENTS) ×2 IMPLANT
NDL HYPO 25X1 1.5 SAFETY (NEEDLE) ×1 IMPLANT
NEEDLE HYPO 25X1 1.5 SAFETY (NEEDLE) ×2 IMPLANT
NS IRRIG 1000ML POUR BTL (IV SOLUTION) ×2 IMPLANT
PACK MINOR (CUSTOM PROCEDURE TRAY) ×2 IMPLANT
PAD ARMBOARD 7.5X6 YLW CONV (MISCELLANEOUS) ×2 IMPLANT
SET BASIN LINEN APH (SET/KITS/TRAYS/PACK) ×2 IMPLANT
SHEET LAVH (DRAPES) ×1 IMPLANT
SUT MNCRL AB 4-0 PS2 18 (SUTURE) ×1 IMPLANT
SUT VIC AB 4-0 SH 27 (SUTURE) ×2
SUT VIC AB 4-0 SH 27XBRD (SUTURE) IMPLANT
SYR CONTROL 10ML LL (SYRINGE) ×2 IMPLANT

## 2018-02-02 NOTE — Anesthesia Postprocedure Evaluation (Signed)
Anesthesia Post Note  Patient: [name]  Procedure(s) Performed: EXCISION CYST, LEFT INNER THIGH (Left )  Patient location during evaluation: PACU Anesthesia Type: General Level of consciousness: awake and alert and patient cooperative Pain management: pain level controlled Vital Signs Assessment: post-procedure vital signs reviewed and stable Respiratory status: spontaneous breathing, nonlabored ventilation and respiratory function stable Cardiovascular status: blood pressure returned to baseline Postop Assessment: no apparent nausea or vomiting Anesthetic complications: no     Last Vitals:  Vitals:   02/02/18 0653  BP: 107/85  Pulse: 71  Resp: 18  Temp: 36.6 C  SpO2: 98%    Last Pain:  Vitals:   02/02/18 0653  TempSrc: Oral  PainSc: 0-No pain                 Erik Nessel J

## 2018-02-02 NOTE — Addendum Note (Signed)
Addendum  created 02/02/18 1022 by Vista Deck, CRNA   Sign clinical note

## 2018-02-02 NOTE — Discharge Instructions (Signed)
Monitored Anesthesia Care, Care After These instructions provide you with information about caring for yourself after your procedure. Your health care provider may also give you more specific instructions. Your treatment has been planned according to current medical practices, but problems sometimes occur. Call your health care provider if you have any problems or questions after your procedure. What can I expect after the procedure? After your procedure, it is common to:  Feel sleepy for several hours.  Feel clumsy and have poor balance for several hours.  Feel forgetful about what happened after the procedure.  Have poor judgment for several hours.  Feel nauseous or vomit.  Have a sore throat if you had a breathing tube during the procedure.  Follow these instructions at home: For at least 24 hours after the procedure:   Do not: ? Participate in activities in which you could fall or become injured. ? Drive. ? Use heavy machinery. ? Drink alcohol. ? Take sleeping pills or medicines that cause drowsiness. ? Make important decisions or sign legal documents. ? Take care of children on your own.  Rest. Eating and drinking  Follow the diet that is recommended by your health care provider.  If you vomit, drink water, juice, or soup when you can drink without vomiting.  Make sure you have little or no nausea before eating solid foods. General instructions  Have a responsible adult stay with you until you are awake and alert.  Take over-the-counter and prescription medicines only as told by your health care provider.  If you smoke, do not smoke without supervision.  Keep all follow-up visits as told by your health care provider. This is important. Contact a health care provider if:  You keep feeling nauseous or you keep vomiting.  You feel light-headed.  You develop a rash.  You have a fever. Get help right away if:  You have trouble breathing. This information is  not intended to replace advice given to you by your health care provider. Make sure you discuss any questions you have with your health care provider. Document Released: 11/22/2015 Document Revised: 03/23/2016 Document Reviewed: 11/22/2015 Elsevier Interactive Patient Education  2018 [place]. Epidermal Cyst An epidermal cyst is a small, painless lump under your skin. It may be called an epidermal inclusion cyst or an infundibular cyst. The cyst contains a grayish-white, bad-smelling substance (keratin). It is important not to pop epidermal cysts yourself. These cysts are usually harmless (benign), but they can get infected. Symptoms of infection may include:  Redness.  Inflammation.  Tenderness.  Warmth.  Fever.  A grayish-white, bad-smelling substance draining from the cyst.  Pus draining from the cyst.  Follow these instructions at home:  Take over-the-counter and prescription medicines only as told by your doctor.  If you were prescribed an antibiotic, use it as told by your doctor. Do not stop using the antibiotic even if you start to feel better.  Keep the area around your cyst clean and dry.  Wear loose, dry clothing.  Do not try to pop your cyst.  Avoid touching your cyst.  Check your cyst every day for signs of infection.  Keep all follow-up visits as told by your doctor. This is important. How is this prevented?  Wear clean, dry, clothing.  Avoid wearing tight clothing.  Keep your skin clean and dry. Shower or take baths every day.  Wash your body with a benzoyl peroxide wash when you shower or bathe. Contact a health care provider if:  Your  cyst has symptoms of infection.  Your condition is not improving or is getting worse.  You have a cyst that looks different from other cysts you have had.  You have a fever. Get help right away if:  Redness spreads from the cyst into the surrounding area. This information is not intended to replace advice  given to you by your health care provider. Make sure you discuss any questions you have with your health care provider. Document Released: 09/08/2004 Document Revised: 03/30/2016 Document Reviewed: 06/03/2015 Elsevier Interactive Patient Education  [employer].

## 2018-02-02 NOTE — Op Note (Signed)
Patient:  [name]  DOB:  1965-09-28  MRN:  [id number]   Preop Diagnosis: Inflamed sebaceous cyst, left thigh  Postop Diagnosis: Same  Procedure: Excision of cyst, left thigh, 2 cm  Surgeon: Aviva Signs, MD  Anes: General  Indications: Patient is a 52 year old female who presents with a recurrent inflamed cyst on the left side.  The risks and benefits of the procedure including bleeding, infection, and the possibility of recurrence of the cyst were fully explained to the patient, who gave informed consent.  Procedure note: The patient was placed in the lithotomy position after general anesthesia was administered.  The left inner thigh was prepped and draped using the usual sterile technique with Betadine.  Surgical site confirmation was performed.  Elliptical incision was made around the inflamed cyst and residual scar tissue.  This was taken down to the subcutaneous tissue.  The lesion was removed and sent to pathology for further examination.  A bleeding was controlled using Bovie electrocautery.  0.5% Sensorcaine was instilled into the surrounding wound.  The subcutaneous layer was reapproximated using a 4-0 Vicryl subcutaneous tissue.  The skin was closed using a 4-0 Monocryl subcuticular suture.  Dermabond was applied.  All tape and needle counts were correct at the end of the procedure.  Patient was awakened and transferred to PACU in stable condition.  Complications: None  EBL: Minimal  Specimen: Cyst, left thigh

## 2018-02-02 NOTE — Anesthesia Postprocedure Evaluation (Signed)
Anesthesia Post Note  Patient: [name]  Procedure(s) Performed: EXCISION CYST, LEFT INNER THIGH (Left )  Patient location during evaluation: PACU Anesthesia Type: General Level of consciousness: awake and alert and patient cooperative Pain management: satisfactory to patient Vital Signs Assessment: post-procedure vital signs reviewed and stable Respiratory status: spontaneous breathing Cardiovascular status: stable Postop Assessment: no apparent nausea or vomiting Anesthetic complications: no     Last Vitals:  Vitals:   02/02/18 0840 02/02/18 0842  BP: 115/77 118/70  Pulse: 67 64  Resp: 11   Temp:    SpO2: 100% 100%    Last Pain:  Vitals:   02/02/18 0842  TempSrc:   PainSc: 0-No pain                 Lyndall Windt

## 2018-02-02 NOTE — Anesthesia Procedure Notes (Signed)
Procedure Name: LMA Insertion Date/Time: 02/02/2018 7:40 AM Performed by: Charmaine Downs, CRNA Pre-anesthesia Checklist: Patient identified, Patient being monitored, Emergency Drugs available, Timeout performed and Suction available Patient Re-evaluated:Patient Re-evaluated prior to induction Oxygen Delivery Method: Circle System Utilized Preoxygenation: Pre-oxygenation with 100% oxygen Induction Type: IV induction Ventilation: Mask ventilation without difficulty LMA: LMA inserted LMA Size: 4.0 Number of attempts: 1 Placement Confirmation: positive ETCO2 and breath sounds checked- equal and bilateral Tube secured with: Tape Dental Injury: Teeth and Oropharynx as per pre-operative assessment

## 2018-02-02 NOTE — Anesthesia Preprocedure Evaluation (Signed)
Anesthesia Evaluation  Patient identified by MRN, date of birth, ID band Patient awake    Reviewed: Allergy & Precautions, NPO status , Patient's Chart, lab work & pertinent test results  History of Anesthesia Complications (+) PONV and history of anesthetic complications  Airway Mallampati: II  TM Distance: >3 FB Neck ROM: Full    Dental no notable dental hx.    Pulmonary neg pulmonary ROS,    Pulmonary exam normal breath sounds clear to auscultation       Cardiovascular Exercise Tolerance: Good negative cardio ROS Normal cardiovascular examI Rhythm:Regular Rate:Normal     Neuro/Psych  Headaches, Anxiety negative neurological ROS  negative psych ROS   GI/Hepatic negative GI ROS, Neg liver ROS, GERD  Medicated and Controlled,H/o Colorectal Ca s/p resection - no CT or RT    Endo/Other  negative endocrine ROS  Renal/GU negative Renal ROS  negative genitourinary   Musculoskeletal negative musculoskeletal ROS (+)   Abdominal   Peds negative pediatric ROS (+)  Hematology negative hematology ROS (+)   Anesthesia Other Findings   Reproductive/Obstetrics negative OB ROS                             Anesthesia Physical Anesthesia Plan  ASA: II  Anesthesia Plan: General   Post-op Pain Management:    Induction: Intravenous  PONV Risk Score and Plan:   Airway Management Planned: LMA  Additional Equipment:   Intra-op Plan:   Post-operative Plan: Extubation in OR  Informed Consent: I have reviewed the patients History and Physical, chart, labs and discussed the procedure including the risks, benefits and alternatives for the proposed anesthesia with the patient or authorized representative who has indicated his/her understanding and acceptance.   Dental advisory given  Plan Discussed with: CRNA  Anesthesia Plan Comments:         Anesthesia Quick Evaluation

## 2018-02-02 NOTE — Transfer of Care (Signed)
Immediate Anesthesia Transfer of Care Note  Patient: [name]  Procedure(s) Performed: EXCISION CYST, LEFT INNER THIGH (Left )  Patient Location: PACU  Anesthesia Type:General  Level of Consciousness: awake and patient cooperative  Airway & Oxygen Therapy: Patient Spontanous Breathing and Patient connected to nasal cannula oxygen  Post-op Assessment: Report [name] to RN and Post -op Vital signs reviewed and stable  Post vital signs: Reviewed and stable  Last Vitals:  Vitals Value Taken Time  BP 115/90 02/02/2018  8:13 AM  Temp    Pulse 72 02/02/2018  8:14 AM  Resp 7 02/02/2018  8:14 AM  SpO2 98 % 02/02/2018  8:14 AM  Vitals shown include unvalidated device data.  Last Pain:  Vitals:   02/02/18 0653  TempSrc: Oral  PainSc: 0-No pain      Patients Stated Pain Goal: 8 (94/83/47 5830)  Complications: No apparent anesthesia complications

## 2018-02-02 NOTE — Interval H&P Note (Signed)
History and Physical Interval Note:  02/02/2018 7:10 AM  [name]  has presented today for surgery, with the diagnosis of 2cm sebaceous cyst on left thigh  The various methods of treatment have been discussed with the patient and family. After consideration of risks, benefits and other options for treatment, the patient has consented to  Procedure(s): EXCISION SEBACEOUS CYST ON LEFT THIGH (Left) as a surgical intervention .  The patient's history has been reviewed, patient examined, no change in status, stable for surgery.  I have reviewed the patient's chart and labs.  Questions were answered to the patient's satisfaction.     Aviva Signs

## 2018-02-05 ENCOUNTER — Encounter (HOSPITAL_COMMUNITY): Payer: Self-pay | Admitting: General Surgery

## 2018-02-13 ENCOUNTER — Encounter: Payer: Self-pay | Admitting: Gastroenterology

## 2018-02-13 ENCOUNTER — Ambulatory Visit (INDEPENDENT_AMBULATORY_CARE_PROVIDER_SITE_OTHER): Payer: Self-pay | Admitting: General Surgery

## 2018-02-13 ENCOUNTER — Encounter: Payer: Self-pay | Admitting: General Surgery

## 2018-02-13 VITALS — BP 139/81 | HR 89 | Temp 97.1°F | Wt 217.0 lb

## 2018-02-13 DIAGNOSIS — Z09 Encounter for follow-up examination after completed treatment for conditions other than malignant neoplasm: Secondary | ICD-10-CM

## 2018-02-13 NOTE — Progress Notes (Signed)
Subjective:     [name]  Status post excision of a cyst in the left thigh.  Patient doing well.  Has no complaints. Objective:    BP 139/81 (BP Location: Left Arm, Patient Position: Sitting, Cuff Size: Large)   Pulse 89   Temp (!) 97.1 F (36.2 C) (Temporal)   Wt 217 lb (98.4 kg)   BMI 36.11 kg/m   General:  alert, cooperative and no distress  Left thigh incision healing well.  No erythema or induration. Final pathology consistent with diagnosis.     Assessment:    Doing well postoperatively.    Plan:   Follow-up as needed.

## 2018-03-03 ENCOUNTER — Ambulatory Visit: Payer: 59 | Admitting: Family Medicine

## 2018-03-03 VITALS — BP 118/83 | HR 84 | Temp 96.9°F | Ht 65.0 in | Wt 218.0 lb

## 2018-03-03 DIAGNOSIS — R05 Cough: Secondary | ICD-10-CM | POA: Diagnosis not present

## 2018-03-03 DIAGNOSIS — R059 Cough, unspecified: Secondary | ICD-10-CM

## 2018-03-03 DIAGNOSIS — M25473 Effusion, unspecified ankle: Secondary | ICD-10-CM | POA: Diagnosis not present

## 2018-03-03 MED ORDER — AZITHROMYCIN 250 MG PO TABS: ORAL_TABLET | ORAL | 0 refills | Status: DC

## 2018-03-03 NOTE — Patient Instructions (Signed)
Great to see you!  Come back or call if you are not improving in 4-5 days.   We will let you know about labs within 1 week.

## 2018-03-03 NOTE — Progress Notes (Signed)
   HPI  Patient presents today here for ankle swelling.  Patient explains she has had ankle swelling and cough over about 2 weeks.  Patient states that the swelling seems to get worse at the end of the day.  It improves over night.  She has a history of rectal cancer which is status post treatment about 2 years ago.  Patient also states that she has had a nagging cough over the last 2 weeks.  Patient's had worsening/flare of her chronic back pain over the last 1 to 2 months.  She states that she has had less activity during this time.  PMH: Smoking status noted ROS: Per HPI  Objective: BP 118/83   Pulse 84   Temp (!) 96.9 F (36.1 C) (Oral)   Ht _0  (1.651 m)   Wt 218 lb (98.9 kg)   BMI 36.28 kg/m  Gen: NAD, alert, cooperative with exam HEENT: NCAT CV: RRR, good S1/S2, no murmur Resp: Nonlabored, good air movement, soft crackles in left lower base Abd: SNTND, BS present, no guarding or organomegaly Ext: No edema, warm Neuro: Alert and oriented, No gross deficits  Assessment and plan:  #Swelling Patient is a healthy-appearing 52 year old female here with new ankle swelling for 1 to 2 weeks. There is some concern for respiratory infection, see below No medications for swelling today, recommended conservative therapy BMP, BNP Unilateral crackles in the base likely infection rather than pulmonary edema  #Cough Patient with soft crackles in the left base, also with swelling.  Consider pulmonary edema, however with unilateral signs this is unlikely Azithromycin Follow-up by phone with myself or PCP in 4 to 5 days, if not improving likely needs to be seen again    Orders Placed This Encounter  Procedures  . BMP8+EGFR  . CBC with Differential/Platelet  . Brain natriuretic peptide    Meds ordered this encounter  Medications  . azithromycin (ZITHROMAX) 250 MG tablet    Sig: Take 2 tablets on day 1 and 1 tablet daily after that    Dispense:  6 tablet    Refill:   [place], MD [facility] 03/03/2018, 8:40 AM

## 2018-03-04 LAB — SPECIMEN STATUS REPORT

## 2018-03-05 ENCOUNTER — Other Ambulatory Visit: Payer: Self-pay

## 2018-03-05 ENCOUNTER — Other Ambulatory Visit: Payer: 59

## 2018-03-05 DIAGNOSIS — M25473 Effusion, unspecified ankle: Secondary | ICD-10-CM | POA: Diagnosis not present

## 2018-03-05 LAB — BMP8+EGFR
BUN/Creatinine Ratio: 11 (ref 9–23)
BUN: 8 mg/dL (ref 6–24)
CALCIUM: 9.3 mg/dL (ref 8.7–10.2)
CO2: 25 mmol/L (ref 20–29)
CREATININE: 0.74 mg/dL (ref 0.57–1.00)
Chloride: 103 mmol/L (ref 96–106)
GFR calc Af Amer: 109 mL/min/{1.73_m2} (ref >59)
GFR calc non Af Amer: 95 mL/min/{1.73_m2} (ref >59)
GLUCOSE: 84 mg/dL (ref 65–99)
Potassium: 4.7 mmol/L (ref 3.5–5.2)
Sodium: 141 mmol/L (ref 134–144)

## 2018-03-05 LAB — CBC WITH DIFFERENTIAL/PLATELET
BASOS: 1 %
Basophils Absolute: 0 10*3/uL (ref 0.0–0.2)
EOS (ABSOLUTE): 0.1 10*3/uL (ref 0.0–0.4)
Eos: 2 %
Hematocrit: 43.9 % (ref 34.0–46.6)
Hemoglobin: 14.2 g/dL (ref 11.1–15.9)
IMMATURE GRANS (ABS): 0 10*3/uL (ref 0.0–0.1)
IMMATURE GRANULOCYTES: 0 %
LYMPHS: 32 %
Lymphocytes Absolute: 1.8 10*3/uL (ref 0.7–3.1)
MCH: 29.1 pg (ref 26.6–33.0)
MCHC: 32.3 g/dL (ref 31.5–35.7)
MCV: 90 fL (ref 79–97)
MONOCYTES: 7 %
MONOS ABS: 0.4 10*3/uL (ref 0.1–0.9)
NEUTROS PCT: 58 %
Neutrophils Absolute: 3.2 10*3/uL (ref 1.4–7.0)
Platelets: 242 10*3/uL (ref 150–450)
RBC: 4.88 x10E6/uL (ref 3.77–5.28)
RDW: 12.5 % (ref 12.3–15.4)
WBC: 5.5 10*3/uL (ref 3.4–10.8)

## 2018-03-05 LAB — BRAIN NATRIURETIC PEPTIDE

## 2018-03-06 LAB — BRAIN NATRIURETIC PEPTIDE: BNP: 7.3 pg/mL (ref 0.0–100.0)

## 2018-03-27 ENCOUNTER — Other Ambulatory Visit: Payer: Self-pay | Admitting: Neurology

## 2018-04-01 DIAGNOSIS — R05 Cough: Secondary | ICD-10-CM | POA: Diagnosis not present

## 2018-04-10 ENCOUNTER — Other Ambulatory Visit: Payer: Self-pay | Admitting: Neurology

## 2018-04-15 ENCOUNTER — Other Ambulatory Visit: Payer: Self-pay | Admitting: Family Medicine

## 2018-04-27 DIAGNOSIS — S82832A Other fracture of upper and lower end of left fibula, initial encounter for closed fracture: Secondary | ICD-10-CM | POA: Insufficient documentation

## 2018-04-27 HISTORY — DX: Other fracture of upper and lower end of left fibula, initial encounter for closed fracture: S82.832A

## 2018-05-04 DIAGNOSIS — M5136 Other intervertebral disc degeneration, lumbar region: Secondary | ICD-10-CM | POA: Diagnosis not present

## 2018-05-04 DIAGNOSIS — M47816 Spondylosis without myelopathy or radiculopathy, lumbar region: Secondary | ICD-10-CM | POA: Diagnosis not present

## 2018-05-21 ENCOUNTER — Other Ambulatory Visit: Payer: Self-pay

## 2018-05-21 ENCOUNTER — Ambulatory Visit: Payer: No Typology Code available for payment source | Attending: Orthopedic Surgery | Admitting: Physical Therapy

## 2018-05-21 ENCOUNTER — Encounter: Payer: Self-pay | Admitting: Physical Therapy

## 2018-05-21 DIAGNOSIS — R2681 Unsteadiness on feet: Secondary | ICD-10-CM | POA: Diagnosis present

## 2018-05-21 DIAGNOSIS — M6281 Muscle weakness (generalized): Secondary | ICD-10-CM | POA: Diagnosis present

## 2018-05-21 DIAGNOSIS — M25572 Pain in left ankle and joints of left foot: Secondary | ICD-10-CM | POA: Diagnosis present

## 2018-05-21 DIAGNOSIS — R262 Difficulty in walking, not elsewhere classified: Secondary | ICD-10-CM

## 2018-05-21 NOTE — Therapy (Signed)
[facility] [place], [place], [address] Phone: [phone]   Fax:  [phone]  Physical Therapy Evaluation  Patient Details  Name: [name] MRN: [id number] Date of Birth: 11/21/1965 Referring Provider (PT): Victorino December, MD   Encounter Date: 05/21/2018  PT End of Session - 05/21/18 1747    Visit Number  1    Number of Visits  12    Date for PT Re-Evaluation  06/25/18    PT Start Time  1118    PT Stop Time  1202    PT Time Calculation (min)  44 min    Equipment Utilized During Treatment  Other (comment)   CAM boot and bilateral axillary crutches   Activity Tolerance  Patient tolerated treatment well;Patient limited by pain    Behavior During Therapy  [facility] for tasks assessed/performed       Past Medical History:  Diagnosis Date  . Anxiety    NEW-DUE TO ANXIETY OVER DX OF CANCER  . Blood transfusion 1995  . Cancer (HCC)    RECTAL CANCER  . Closed fracture of left distal fibula 04/27/2018  . Constipation    SOME BLOOD IN STOOL  . GERD (gastroesophageal reflux disease)    occasionally-NO MEDS  . History of kidney stones   . Kidney stone   . PONV (postoperative nausea and vomiting)    used a scop patch last surgery-was better    Past Surgical History:  Procedure Laterality Date  . ABDOMINAL HYSTERECTOMY  1995   partial  . BACK SURGERY  2015  . COLON SURGERY    . COLONOSCOPY  06/01/2012   Procedure: COLONOSCOPY;  Surgeon: Danie Binder, MD;  [facility];  Service: Endoscopy;  Laterality: N/A;  1:30PM  . COLONOSCOPY N/A 06/14/2013   MOQ:HUTM diverticulosis/normal anastomosis  . endoscopic left fallopian tube removed  several yrs ago  . EUS  06/06/2012   Procedure: LOWER ENDOSCOPIC ULTRASOUND (EUS);  Surgeon: Arta Silence, MD;  Location: [facility];  Service: Endoscopy;  Laterality: N/A;  . EXAMINATION UNDER ANESTHESIA  09/07/2012   Procedure: EXAM UNDER ANESTHESIA;  Surgeon:  Stark Klein, MD;  Location: [facility];  Service: General;  Laterality: N/A;  . EXCISION/RELEASE BURSA HIP  09/15/2011   Dr Tonita Cong EXCISION/RELEASE BURSA HIP;  Surgeon: Johnn Hai, MD;  Location: [facility];  Service: Orthopedics;  Laterality: Left;  Excision of Trochanteric Bursitis  . FLEXIBLE SIGMOIDOSCOPY N/A 03/22/2013   Procedure: FLEXIBLE SIGMOIDOSCOPY;  Surgeon: Danie Binder, MD;  [facility];  Service: Endoscopy;  Laterality: N/A;  2:00  . LAPAROSCOPIC LOW ANTERIOR RESECTION  07/02/2012   Procedure: LAPAROSCOPIC LOW ANTERIOR RESECTION;  Surgeon: Stark Klein, MD;  Location: [facility];  Service: General;  Laterality: N/A;  . left ankle surgery for fx  several yrs ago  . LUMBAR LAMINECTOMY/DECOMPRESSION MICRODISCECTOMY N/A 05/14/2014   Procedure: LUMBAR DECOMPRESSION L2-L3;  Surgeon: Johnn Hai, MD;  Location: [facility];  Service: Orthopedics;  Laterality: N/A;  . MASS EXCISION Left 02/02/2018   Procedure: EXCISION CYST, LEFT INNER THIGH;  Surgeon: Aviva Signs, MD;  Location: [facility];  Service: General;  Laterality: Left;  . PROCTOSCOPY  09/07/2012   Procedure: PROCTOSCOPY;  Surgeon: Stark Klein, MD;  Location: [facility];  Service: General;  Laterality: N/A;    There were no vitals filed for this visit.   Subjective Assessment - 05/21/18 1738    Subjective  Patient arrives to physical  therapy with reports of L ankle pain and difficulty walking secondary to left distal fibula fracture sustained on April 27, 2018. Patient reports she was cleaning machinery and was descending narrow steps when she slipped off the step, twisted her ankle and heard a pop. She stated she did not fall to the ground and caught herself by holding onto both handrails. Patient is in a CAM boot but is unsure of how long she will be in it for. She has difficulties with washing and dressing and reports her mother assisting with tasks. Patient is modified independent with  ambulation but requires supervision to negotiate back steps to enter/exit home. Patient's goals are to derease pain, improve walking and return to PLOF.     Pertinent History  Left distal fibula fracture 04/27/18    Limitations  House hold activities;Standing;Walking    Diagnostic tests  x-ray    Patient Stated Goals  decrease pain and return to normal    Currently in Pain?  Yes    Pain Score  3     Pain Location  Ankle    Pain Orientation  Left;Lateral    Pain Descriptors / Indicators  Dull;Constant    Pain Type  Acute pain    Pain Onset  1 to 4 weeks ago    Pain Frequency  Constant    Aggravating Factors   being out of the boot for sleeping    Pain Relieving Factors  pain medication    Effect of Pain on Daily Activities  difficulty with showering and dressing.         [facility] PT Assessment - 05/21/18 0001      Assessment   Medical Diagnosis  Closed fracture of left distal fibula    Referring Provider (PT)  Victorino December, MD    Onset Date/Surgical Date  04/27/18    Next MD Visit  Oct 30th or 31st    Prior Therapy  no      Precautions   Precautions  Other (comment)    Precaution Comments  No WB out of CAM boot      Restrictions   Other Position/Activity Restrictions  WBAT in CAM boot      Balance Screen   Has the patient fallen in the past 6 months  No    Has the patient had a decrease in activity level because of a fear of falling?   Yes    Is the patient reluctant to leave their home because of a fear of falling?   Yes      [place]  Private residence    Living Arrangements  Children    Available Help at Discharge  Family    Type of [place] to enter    Entrance Stairs-Number of Steps  2 then landing then 2    Entrance Stairs-Rails  Can reach both      Prior Function   Level of Independence  Needs assistance with ADLs;Independent with household mobility with device;Requires assistive device for independence    Crutches   Level of Independence - [place]  Minimal   assist from mother     Observation/Other Assessments-Edema    Edema  Figure 8      Figure 8 Edema   Figure 8 - Right   54 cm    Figure 8 - Left   52 cm      Sensation   Light Touch  Appears Intact      ROM / Strength   AROM / PROM / Strength  AROM;PROM      AROM   Overall AROM   Due to pain    AROM Assessment Site  Ankle    Right/Left Ankle  Left    Left Ankle Dorsiflexion  -30   -20 with knee flexed   Left Ankle Plantar Flexion  38    Left Ankle Inversion  13    Left Ankle Eversion  8      PROM   Overall PROM   Due to pain    PROM Assessment Site  Ankle    Right/Left Ankle  Left    Left Ankle Dorsiflexion  -16    Left Ankle Plantar Flexion  55    Left Ankle Inversion  15    Left Ankle Eversion  10      Palpation   Palpation comment  very tender to palpation to left lateral malleoli and medial malleoli      Transfers   Transfers  Independent with all Transfers    Comments  increased time to perform activity      Ambulation/Gait   Assistive device  Crutches    Gait Pattern  Step-through pattern;Decreased weight shift to left;Decreased step length - right;Decreased stance time - left;Antalgic;Left flexed knee in stance      Balance   Balance Assessed  Yes      Static Standing Balance   Static Standing - Balance Support  No upper extremity supported;Right upper extremity supported    Static Standing - Level of Assistance  5: Stand by assistance    Static Standing Balance -  Activities   [place] - Eyes Opened    Static Standing - Comment/# of Minutes  5 seconds                Objective measurements completed on examination: See above findings.              PT Education - 05/21/18 1747    Education Details  ankle pumps, toe curls    Person(s) Educated  Patient    Methods  Explanation;Handout;Demonstration    Comprehension  Returned demonstration;Verbalized understanding       PT  Short Term Goals - 05/21/18 1800      PT SHORT TERM GOAL #1   Title  Patient will be independent with HEP.    Time  2    Period  Weeks    Status  New      PT SHORT TERM GOAL #2   Title  Patient will demonstrate proper stair negotiation with one axillary crutch to safely enter and exit home    Time  2    Period  Weeks    Status  New      PT SHORT TERM GOAL #3   Title  Patient will ambulate with CAM boot and 1 axillary crutch to improve gait.    Time  2    Period  Weeks    Status  New        PT Long Term Goals - 05/21/18 1802      PT LONG TERM GOAL #1   Title  Patient will demonstrate 4 degrees or greater of left DF AROM to improve gait mechanics    Time  4    Period  Weeks    Status  New      PT LONG TERM GOAL #2   Title  Patient will demonstrate 4/5 or greater left ankle MMT in all planes to improve stability during functional activities.    Time  4    Period  Weeks    Status  New      PT LONG TERM GOAL #3   Title  Patient will demonstrate a normalized gait pattern with no AD to improve gait mechanics.    Time  4    Status  New      PT LONG TERM GOAL #4   Title  Patient will demonstrate reciprocating stair negotiation with one railing to safely enter and exit home.    Time  4    Period  Weeks    Status  New      PT LONG TERM GOAL #5   Title  Patient will report ability to peform ADLs independently with pain less than or equal to 3/10 in left ankle.    Time  4    Period  Weeks    Status  New             Plan - 05/21/18 1749    Clinical Impression Statement  Patient is a 52 year old female who presents to physical therapy with decreased left ankle ROM, left ankle pain, and difficulty walking with CAM boot and bilateral axillary crutches secondary to left distal fibula fracture on April 27, 2018. Patient noted with decreased static balance and ambulates with step to gait pattern, decreased right step length, left knee flexion during stance, and  decreased weight shifting to left. Patient would benefit from skilled physical therapy to address deficits and address patient's goals.     Clinical Presentation  Stable    Clinical Decision Making  Low    Rehab Potential  Good    PT Frequency  3x / week    PT Duration  4 weeks    PT Treatment/Interventions  ADLs/Self Care Home Management;Gait training;Stair training;Neuromuscular re-education;Passive range of motion;Manual techniques;Functional mobility training;Therapeutic activities;Therapeutic exercise;Moist Heat;Cryotherapy;Electrical Stimulation;Balance training;Taping;Vasopneumatic Device;Patient/family education    PT Next Visit Plan  gait and pre-gait activities with boot, stair training, PROM, modalities PRN for pain relief    PT Home Exercise Plan  see patient education section    Consulted and Agree with Plan of Care  Patient       Patient will benefit from skilled therapeutic intervention in order to improve the following deficits and impairments:  Pain, Decreased activity tolerance, Decreased range of motion, Decreased strength, Postural dysfunction, Decreased balance, Difficulty walking, Increased edema, Decreased endurance  Visit Diagnosis: Pain in left ankle and joints of left foot - Plan: PT plan of care cert/re-cert  Difficulty in walking, not elsewhere classified - Plan: PT plan of care cert/re-cert  Muscle weakness (generalized) - Plan: PT plan of care cert/re-cert  Unsteadiness on feet - Plan: PT plan of care cert/re-cert     Problem List Patient Active Problem List   Diagnosis Date Noted  . Closed fracture of left distal fibula 04/27/2018  . Sebaceous cyst   . Chronic low back pain 09/01/2017  . Migraine without aura 08/10/2017  . Sleep disorder 07/10/2017  . 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 Headache disorder 07/10/2017  . GERD (gastroesophageal reflux disease) 05/03/2017  . HNP (herniated nucleus pulposus), lumbar 05/14/2014  . Internal and  external prolapsed hemorrhoids 03/15/2013  . Anxiety state, unspecified 03/15/2013  . Dysuria 07/23/2012  . Rectal cancer, cT2N0, 7 cm from anal verge 06/11/2012  . Hematochezia 05/25/2012  .  Constipation 05/25/2012    Gabriela Eves, PT, DPT 05/21/2018, 6:16 PM  [facility] [address] [place], [place], [address] Phone: [phone]   Fax:  [phone]  Name: Caralee Morea MRN: [id number] Date of Birth: Jan 05, 1966

## 2018-05-22 ENCOUNTER — Other Ambulatory Visit: Payer: Self-pay | Admitting: *Deleted

## 2018-05-22 MED ORDER — LINACLOTIDE 290 MCG PO CAPS: 290.0000 ug | ORAL_CAPSULE | Freq: Every day | ORAL | 0 refills | Status: DC

## 2018-05-23 ENCOUNTER — Ambulatory Visit: Payer: No Typology Code available for payment source | Admitting: Physical Therapy

## 2018-05-23 ENCOUNTER — Encounter: Payer: Self-pay | Admitting: Physical Therapy

## 2018-05-23 DIAGNOSIS — M25572 Pain in left ankle and joints of left foot: Secondary | ICD-10-CM | POA: Diagnosis not present

## 2018-05-23 DIAGNOSIS — M6281 Muscle weakness (generalized): Secondary | ICD-10-CM

## 2018-05-23 DIAGNOSIS — R262 Difficulty in walking, not elsewhere classified: Secondary | ICD-10-CM

## 2018-05-23 DIAGNOSIS — R2681 Unsteadiness on feet: Secondary | ICD-10-CM

## 2018-05-23 NOTE — Therapy (Signed)
[facility] [place], [place], [address] Phone: [phone]   Fax:  [phone]  Physical Therapy Treatment  Patient Details  Name: [name] MRN: [id number] Date of Birth: 1966/07/27 Referring Provider (PT): Victorino December, MD   Encounter Date: 05/23/2018  PT End of Session - 05/23/18 0904    Visit Number  2    Number of Visits  12    Date for PT Re-Evaluation  06/25/18    PT Start Time  0904    PT Stop Time  0951    PT Time Calculation (min)  47 min    Equipment Utilized During Treatment  Other (comment)   B axillary crutches, CAM boot   Activity Tolerance  Patient tolerated treatment well;Patient limited by pain    Behavior During Therapy  [facility] for tasks assessed/performed       Past Medical History:  Diagnosis Date  . Anxiety    NEW-DUE TO ANXIETY OVER DX OF CANCER  . Blood transfusion 1995  . Cancer (HCC)    RECTAL CANCER  . Closed fracture of left distal fibula 04/27/2018  . Constipation    SOME BLOOD IN STOOL  . GERD (gastroesophageal reflux disease)    occasionally-NO MEDS  . History of kidney stones   . Kidney stone   . PONV (postoperative nausea and vomiting)    used a scop patch last surgery-was better    Past Surgical History:  Procedure Laterality Date  . ABDOMINAL HYSTERECTOMY  1995   partial  . BACK SURGERY  2015  . COLON SURGERY    . COLONOSCOPY  06/01/2012   Procedure: COLONOSCOPY;  Surgeon: Danie Binder, MD;  [facility];  Service: Endoscopy;  Laterality: N/A;  1:30PM  . COLONOSCOPY N/A 06/14/2013   [id number] diverticulosis/normal anastomosis  . endoscopic left fallopian tube removed  several yrs ago  . EUS  06/06/2012   Procedure: LOWER ENDOSCOPIC ULTRASOUND (EUS);  Surgeon: Arta Silence, MD;  Location: [facility];  Service: Endoscopy;  Laterality: N/A;  . EXAMINATION UNDER ANESTHESIA  09/07/2012   Procedure: EXAM UNDER ANESTHESIA;  Surgeon: Stark Klein,  MD;  Location: [facility] [facility];  Service: General;  Laterality: N/A;  . EXCISION/RELEASE BURSA HIP  09/15/2011   Dr Tonita Cong EXCISION/RELEASE BURSA HIP;  Surgeon: Johnn Hai, MD;  [facility];  Service: Orthopedics;  Laterality: Left;  Excision of Trochanteric Bursitis  . FLEXIBLE SIGMOIDOSCOPY N/A 03/22/2013   Procedure: FLEXIBLE SIGMOIDOSCOPY;  Surgeon: Danie Binder, MD;  [facility];  Service: Endoscopy;  Laterality: N/A;  2:00  . LAPAROSCOPIC LOW ANTERIOR RESECTION  07/02/2012   Procedure: LAPAROSCOPIC LOW ANTERIOR RESECTION;  Surgeon: Stark Klein, MD;  [facility];  Service: General;  Laterality: N/A;  . left ankle surgery for fx  several yrs ago  . LUMBAR LAMINECTOMY/DECOMPRESSION MICRODISCECTOMY N/A 05/14/2014   Procedure: LUMBAR DECOMPRESSION L2-L3;  Surgeon: Johnn Hai, MD;  [facility];  Service: Orthopedics;  Laterality: N/A;  . MASS EXCISION Left 02/02/2018   Procedure: EXCISION CYST, LEFT INNER THIGH;  Surgeon: Aviva Signs, MD;  [facility];  Service: General;  Laterality: Left;  . PROCTOSCOPY  09/07/2012   Procedure: PROCTOSCOPY;  Surgeon: Stark Klein, MD;  Location: [place];  Service: General;  Laterality: N/A;    There were no vitals filed for this visit.  Subjective Assessment - 05/23/18 0903    Subjective  Reports that her foot is "alright."  Pertinent History  Left distal fibula fracture 04/27/18    Limitations  House hold activities;Standing;Walking    Diagnostic tests  x-ray    Patient Stated Goals  decrease pain and return to normal    Currently in Pain?  Yes    Pain Score  2     Pain Location  Ankle    Pain Orientation  Left;Lateral    Pain Descriptors / Indicators  Aching    Pain Type  Acute pain    Pain Onset  1 to 4 weeks ago    Pain Frequency  Intermittent         [facility] PT Assessment - 05/23/18 0001      Assessment   Medical Diagnosis  Closed fracture of left distal fibula    Onset  Date/Surgical Date  04/27/18    Next MD Visit  Oct 30th or 31st    Prior Therapy  no      Precautions   Precautions  Other (comment)    Precaution Comments  No WB out of CAM boot      Restrictions   Other Position/Activity Restrictions  WBAT in CAM boot                   [facility] Adult PT Treatment/Exercise - 05/23/18 0001      Ambulation/Gait   Ambulation/Gait  Yes    Ambulation/Gait Assistance  6: Modified independent (Device/Increase time)    Ambulation/Gait Assistance Details  Gait training to assist with return to work     Ambulation Distance (Feet)  [place] device  R Axillary Crutch;None    Gait Pattern  Step-through pattern;Decreased arm swing - left;Decreased step length - left;Decreased stance time - left;Decreased weight shift to left;Antalgic;Lateral trunk lean to right    Ambulation Surface  Level;Indoor      Exercises   Exercises  Ankle      Modalities   Modalities  Psychologist, educational Location  L ankle    Electrical Stimulation Action  IFC    Electrical Stimulation Parameters  80-150 hz x15 min    Electrical Stimulation Goals  Pain;Edema      Vasopneumatic   Number Minutes Vasopneumatic   15 minutes    Vasopnuematic Location   Ankle    Vasopneumatic Pressure  Low    Vasopneumatic Temperature   50      Ankle Exercises: Seated   Ankle Circles/Pumps  AROM;Left;20 reps    Towel Crunch  Other (comment)   x2 min   Heel Raises  Both;20 reps    Toe Raise  20 reps    Heel Slides  Left;20 reps    Other Seated Ankle Exercises  Rockerboard Df/Pf x3 min    Other Seated Ankle Exercises  Dyandisc DF/Pf x3 min, circles x2 min               PT Short Term Goals - 05/21/18 1800      PT SHORT TERM GOAL #1   Title  Patient will be independent with HEP.    Time  2    Period  Weeks    Status  New      PT SHORT TERM GOAL #2   Title  Patient will demonstrate proper  stair negotiation with one axillary crutch to safely enter and exit home    Time  2    Period  Weeks  Status  New      PT SHORT TERM GOAL #3   Title  Patient will ambulate with CAM boot and 1 axillary crutch to improve gait.    Time  2    Period  Weeks    Status  New        PT Long Term Goals - 05/21/18 1802      PT LONG TERM GOAL #1   Title  Patient will demonstrate 4 degrees or greater of left DF AROM to improve gait mechanics    Time  4    Period  Weeks    Status  New      PT LONG TERM GOAL #2   Title  Patient will demonstrate 4/5 or greater left ankle MMT in all planes to improve stability during functional activities.    Time  4    Period  Weeks    Status  New      PT LONG TERM GOAL #3   Title  Patient will demonstrate a normalized gait pattern with no AD to improve gait mechanics.    Time  4    Status  New      PT LONG TERM GOAL #4   Title  Patient will demonstrate reciprocating stair negotiation with one railing to safely enter and exit home.    Time  4    Period  Weeks    Status  New      PT LONG TERM GOAL #5   Title  Patient will report ability to peform ADLs independently with pain less than or equal to 3/10 in left ankle.    Time  4    Period  Weeks    Status  New            Plan - 05/23/18 3086    Clinical Impression Statement  Patient presented in clinic with low level L ankle pain today and able to tolerate treatment fairly well. Patient guided through low level ROM exercises in sitting due to WB restrictions. Patient reported discomfort and small pops in lateral L ankle with dynadisc circles. Patient initially very hesitant with dynadisc circles in which she was educated regarding MD orders and WB restriction. Patient attempting to return to work with modifications for limited walking in place but wishes to return without AD. Gait training completed initially without AD per her wishes but patient very worried about increasing pain or reinjury.  Patient again re-educated to her WB restrictions per MD orders. Patient then trained utilizing R axillary crutch and three point gait pattern. Patient felt much more comfortable and confident with one axillary crutch. Patient encouraged to stand more erect due to increased R weightbearing and leaning to axillary crutch. Normal modalities response noted following remova of the modalities. Patient instructed that with return to work and dependent sitting that elevation and icing will be very important once she got home from work.    Rehab Potential  Good    PT Frequency  3x / week    PT Duration  4 weeks    PT Treatment/Interventions  ADLs/Self Care Home Management;Gait training;Stair training;Neuromuscular re-education;Passive range of motion;Manual techniques;Functional mobility training;Therapeutic activities;Therapeutic exercise;Moist Heat;Cryotherapy;Electrical Stimulation;Balance training;Taping;Vasopneumatic Device;Patient/family education    PT Next Visit Plan  Continue with seated ROM exercises and possible gait training to progress without AD. Assess response to return to work.    PT Home Exercise Plan  see patient education section    Consulted and Agree with Plan of Care  Patient       Patient will benefit from skilled therapeutic intervention in order to improve the following deficits and impairments:  Pain, Decreased activity tolerance, Decreased range of motion, Decreased strength, Postural dysfunction, Decreased balance, Difficulty walking, Increased edema, Decreased endurance  Visit Diagnosis: Pain in left ankle and joints of left foot  Difficulty in walking, not elsewhere classified  Muscle weakness (generalized)  Unsteadiness on feet     Problem List Patient Active Problem List   Diagnosis Date Noted  . Closed fracture of left distal fibula 04/27/2018  . Sebaceous cyst   . Chronic low back pain 09/01/2017  . Migraine without aura 08/10/2017  . Sleep disorder  07/10/2017  . 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 Headache disorder 07/10/2017  . GERD (gastroesophageal reflux disease) 05/03/2017  . HNP (herniated nucleus pulposus), lumbar 05/14/2014  . Internal and external prolapsed hemorrhoids 03/15/2013  . Anxiety state, unspecified 03/15/2013  . Dysuria 07/23/2012  . Rectal cancer, cT2N0, 7 cm from anal verge 06/11/2012  . Hematochezia 05/25/2012  . Constipation 05/25/2012    Standley Brooking, PTA 05/23/2018, 9:55 AM  [facility] [address] [place], [place], [address] Phone: [phone]   Fax:  [phone]  Name: Shaleta Ruacho MRN: [id number] Date of Birth: [birth year]

## 2018-05-25 ENCOUNTER — Encounter: Payer: Self-pay | Admitting: Physical Therapy

## 2018-05-28 ENCOUNTER — Ambulatory Visit: Payer: No Typology Code available for payment source | Admitting: Physical Therapy

## 2018-05-28 ENCOUNTER — Encounter: Payer: Self-pay | Admitting: Physical Therapy

## 2018-05-28 DIAGNOSIS — R262 Difficulty in walking, not elsewhere classified: Secondary | ICD-10-CM

## 2018-05-28 DIAGNOSIS — R2681 Unsteadiness on feet: Secondary | ICD-10-CM

## 2018-05-28 DIAGNOSIS — M6281 Muscle weakness (generalized): Secondary | ICD-10-CM

## 2018-05-28 DIAGNOSIS — M25572 Pain in left ankle and joints of left foot: Secondary | ICD-10-CM | POA: Diagnosis not present

## 2018-05-28 NOTE — Therapy (Signed)
[name], [place], [address] Phone: [phone]   Fax:  [phone]  Physical Therapy Treatment  Patient Details  Name: [name] MRN: [id number] Date of Birth: [birth year] Referring Provider (PT): Victorino December, MD   Encounter Date: 05/28/2018  PT End of Session - 05/28/18 1433    Visit Number  3    Number of Visits  12    Date for PT Re-Evaluation  06/25/18    PT Start Time  1430    PT Stop Time  1516    PT Time Calculation (min)  46 min    Equipment Utilized During Treatment  Other (comment)   One axillary crutch, CAM boot   Activity Tolerance  Patient tolerated treatment well;Patient limited by pain    Behavior During Therapy  [facility] for tasks assessed/performed       Past Medical History:  Diagnosis Date  . Anxiety    NEW-DUE TO ANXIETY OVER DX OF CANCER  . Blood transfusion 1995  . Cancer (HCC)    RECTAL CANCER  . Closed fracture of left distal fibula 04/27/2018  . Constipation    SOME BLOOD IN STOOL  . GERD (gastroesophageal reflux disease)    occasionally-NO MEDS  . History of kidney stones   . Kidney stone   . PONV (postoperative nausea and vomiting)    used a scop patch last surgery-was better    Past Surgical History:  Procedure Laterality Date  . ABDOMINAL HYSTERECTOMY  1995   partial  . BACK SURGERY  2015  . COLON SURGERY    . COLONOSCOPY  06/01/2012   Procedure: COLONOSCOPY;  Surgeon: Danie Binder, MD;  [facility];  Service: Endoscopy;  Laterality: N/A;  1:30PM  . COLONOSCOPY N/A 06/14/2013   PNT:IRWE diverticulosis/normal anastomosis  . endoscopic left fallopian tube removed  several yrs ago  . EUS  06/06/2012   Procedure: LOWER ENDOSCOPIC ULTRASOUND (EUS);  Surgeon: Arta Silence, MD;  [facility];  Service: Endoscopy;  Laterality: N/A;  . EXAMINATION UNDER ANESTHESIA  09/07/2012   Procedure: EXAM UNDER ANESTHESIA;  Surgeon: Stark Klein,  MD;  Location: [place];  Service: General;  Laterality: N/A;  . EXCISION/RELEASE BURSA HIP  09/15/2011   Dr Tonita Cong EXCISION/RELEASE BURSA HIP;  Surgeon: Johnn Hai, MD;  [facility];  Service: Orthopedics;  Laterality: Left;  Excision of Trochanteric Bursitis  . FLEXIBLE SIGMOIDOSCOPY N/A 03/22/2013   Procedure: FLEXIBLE SIGMOIDOSCOPY;  Surgeon: Danie Binder, MD;  [facility];  Service: Endoscopy;  Laterality: N/A;  2:00  . LAPAROSCOPIC LOW ANTERIOR RESECTION  07/02/2012   Procedure: LAPAROSCOPIC LOW ANTERIOR RESECTION;  Surgeon: Stark Klein, MD;  [facility];  Service: General;  Laterality: N/A;  . left ankle surgery for fx  several yrs ago  . LUMBAR LAMINECTOMY/DECOMPRESSION MICRODISCECTOMY N/A 05/14/2014   Procedure: LUMBAR DECOMPRESSION L2-L3;  Surgeon: Johnn Hai, MD;  [facility];  Service: Orthopedics;  Laterality: N/A;  . MASS EXCISION Left 02/02/2018   Procedure: EXCISION CYST, LEFT INNER THIGH;  Surgeon: Aviva Signs, MD;  [facility];  Service: General;  Laterality: Left;  . PROCTOSCOPY  09/07/2012   Procedure: PROCTOSCOPY;  Surgeon: Stark Klein, MD;  Location: [place];  Service: General;  Laterality: N/A;    There were no vitals filed for this visit.  Subjective Assessment - 05/28/18 1432    Subjective  Reports that she has been trying  to standing longer as she is returning to work tomorrow.    Pertinent History  Left distal fibula fracture 04/27/18    Limitations  House hold activities;Standing;Walking    Diagnostic tests  x-ray    Patient Stated Goals  decrease pain and return to normal    Currently in Pain?  Yes    Pain Score  4     Pain Location  Ankle    Pain Orientation  Left    Pain Descriptors / Indicators  Sore;Aching    Pain Type  Acute pain    Pain Onset  1 to 4 weeks ago    Pain Frequency  Intermittent         [facility] PT Assessment - 05/28/18 0001      Assessment   Medical Diagnosis   Closed fracture of left distal fibula    Onset Date/Surgical Date  04/27/18    Next MD Visit  Oct 30th or 31st    Prior Therapy  no      Precautions   Precautions  Other (comment)    Precaution Comments  No WB out of CAM boot      Restrictions   Other Position/Activity Restrictions  WBAT in CAM boot                   [facility] Adult PT Treatment/Exercise - 05/28/18 0001      Modalities   Modalities  Electrical Stimulation;Vasopneumatic      Electrical Stimulation   Electrical Stimulation Location  L ankle    Electrical Stimulation Action  IFC    Electrical Stimulation Parameters  80-150 hz x15 min    Electrical Stimulation Goals  Pain;Edema      Vasopneumatic   Number Minutes Vasopneumatic   15 minutes    Vasopnuematic Location   Ankle    Vasopneumatic Pressure  Low    Vasopneumatic Temperature   50      Ankle Exercises: Seated   Ankle Circles/Pumps  AROM;Left;20 reps    Heel Raises  Both;Other (comment)   3x10 reps   Toe Raise  Other (comment)   x30 reps   Other Seated Ankle Exercises  Rockerboard Df/Pf x5 min    Other Seated Ankle Exercises  Dyandisc DF/Pf x3 min, circles x3 min               PT Short Term Goals - 05/28/18 1509      PT SHORT TERM GOAL #1   Title  Patient will be independent with HEP.    Time  2    Period  Weeks    Status  Achieved      PT SHORT TERM GOAL #2   Title  Patient will demonstrate proper stair negotiation with one axillary crutch to safely enter and exit home    Time  2    Period  Weeks    Status  On-going      PT SHORT TERM GOAL #3   Title  Patient will ambulate with CAM boot and 1 axillary crutch to improve gait.    Time  2    Period  Weeks    Status  Achieved        PT Long Term Goals - 05/28/18 1509      PT LONG TERM GOAL #1   Title  Patient will demonstrate 4 degrees or greater of left DF AROM to improve gait mechanics    Time  4    Period  Weeks  Status  On-going      PT LONG TERM GOAL #2    Title  Patient will demonstrate 4/5 or greater left ankle MMT in all planes to improve stability during functional activities.    Time  4    Period  Weeks    Status  On-going      PT LONG TERM GOAL #3   Title  Patient will demonstrate a normalized gait pattern with no AD to improve gait mechanics.    Time  4    Status  On-going      PT LONG TERM GOAL #4   Title  Patient will demonstrate reciprocating stair negotiation with one railing to safely enter and exit home.    Time  4    Period  Weeks    Status  On-going      PT LONG TERM GOAL #5   Title  Patient will report ability to peform ADLs independently with pain less than or equal to 3/10 in left ankle.    Time  4    Period  Weeks    Status  On-going            Plan - 05/28/18 1504    Clinical Impression Statement  Patient presented in clinic with greater L ankle and proximal forefoot edema. Primary edema present around L lateral malleoli. Patient initially very hesitant with all exercises completed in sitting and encouraged to complete gently with each exercise. Initially patient experienced pulling sensation in L achilles region. Patient again encouraged to complete elevationg and icing when she could but especially upon returning home from work.    Rehab Potential  Good    PT Frequency  3x / week    PT Duration  4 weeks    PT Treatment/Interventions  ADLs/Self Care Home Management;Gait training;Stair training;Neuromuscular re-education;Passive range of motion;Manual techniques;Functional mobility training;Therapeutic activities;Therapeutic exercise;Moist Heat;Cryotherapy;Electrical Stimulation;Balance training;Taping;Vasopneumatic Device;Patient/family education    PT Next Visit Plan  Continue with seated ROM exercises and possible gait training to progress without AD. Assess response to return to work.    PT Home Exercise Plan  see patient education section    Consulted and Agree with Plan of Care  Patient       Patient  will benefit from skilled therapeutic intervention in order to improve the following deficits and impairments:  Pain, Decreased activity tolerance, Decreased range of motion, Decreased strength, Postural dysfunction, Decreased balance, Difficulty walking, Increased edema, Decreased endurance  Visit Diagnosis: Pain in left ankle and joints of left foot  Difficulty in walking, not elsewhere classified  Muscle weakness (generalized)  Unsteadiness on feet     Problem List Patient Active Problem List   Diagnosis Date Noted  . Closed fracture of left distal fibula 04/27/2018  . Sebaceous cyst   . Chronic low back pain 09/01/2017  . Migraine without aura 08/10/2017  . Sleep disorder 07/10/2017  . 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 Headache disorder 07/10/2017  . GERD (gastroesophageal reflux disease) 05/03/2017  . HNP (herniated nucleus pulposus), lumbar 05/14/2014  . Internal and external prolapsed hemorrhoids 03/15/2013  . Anxiety state, unspecified 03/15/2013  . Dysuria 07/23/2012  . Rectal cancer, cT2N0, 7 cm from anal verge 06/11/2012  . Hematochezia 05/25/2012  . Constipation 05/25/2012    Standley Brooking, PTA 05/28/2018, 3:21 PM  [facility] [address] [place], [place], [address] Phone: [phone]   Fax:  [phone]  Name: Ma Munoz MRN: [id number]  Date of Birth: 08-23-1965

## 2018-05-31 ENCOUNTER — Ambulatory Visit: Payer: 59 | Attending: Orthopedic Surgery | Admitting: *Deleted

## 2018-05-31 DIAGNOSIS — R262 Difficulty in walking, not elsewhere classified: Secondary | ICD-10-CM | POA: Diagnosis present

## 2018-05-31 DIAGNOSIS — R2681 Unsteadiness on feet: Secondary | ICD-10-CM

## 2018-05-31 DIAGNOSIS — M6281 Muscle weakness (generalized): Secondary | ICD-10-CM | POA: Insufficient documentation

## 2018-05-31 DIAGNOSIS — M25572 Pain in left ankle and joints of left foot: Secondary | ICD-10-CM

## 2018-05-31 NOTE — Therapy (Signed)
[facility] [place], [place], [address] Phone: [phone]   Fax:  [phone]  Physical Therapy Treatment  Patient Details  Name: [name] MRN: [id number] Date of Birth: June 06, 1966 Referring Provider (PT): Victorino December, MD   Encounter Date: 05/31/2018  PT End of Session - 05/31/18 2423    Visit Number  4    Number of Visits  12    Date for PT Re-Evaluation  06/25/18    PT Start Time  5361    PT Stop Time  4431    PT Time Calculation (min)  50 min       Past Medical History:  Diagnosis Date  . Anxiety    NEW-DUE TO ANXIETY OVER DX OF CANCER  . Blood transfusion 1995  . Cancer (HCC)    RECTAL CANCER  . Closed fracture of left distal fibula 04/27/2018  . Constipation    SOME BLOOD IN STOOL  . GERD (gastroesophageal reflux disease)    occasionally-NO MEDS  . History of kidney stones   . Kidney stone   . PONV (postoperative nausea and vomiting)    used a scop patch last surgery-was better    Past Surgical History:  Procedure Laterality Date  . ABDOMINAL HYSTERECTOMY  1995   partial  . BACK SURGERY  2015  . COLON SURGERY    . COLONOSCOPY  06/01/2012   Procedure: COLONOSCOPY;  Surgeon: Danie Binder, MD;  [facility];  Service: Endoscopy;  Laterality: N/A;  1:30PM  . COLONOSCOPY N/A 06/14/2013   VQM:GQQP diverticulosis/normal anastomosis  . endoscopic left fallopian tube removed  several yrs ago  . EUS  06/06/2012   Procedure: LOWER ENDOSCOPIC ULTRASOUND (EUS);  Surgeon: Arta Silence, MD;  Location[facility];  Service: Endoscopy;  Laterality: N/A;  . EXAMINATION UNDER ANESTHESIA  09/07/2012   Procedure: EXAM UNDER ANESTHESIA;  Surgeon: Stark Klein, MD;  Location: [facility];  Service: General;  Laterality: N/A;  . EXCISION/RELEASE BURSA HIP  09/15/2011   Dr Tonita Cong EXCISION/RELEASE BURSA HIP;  Surgeon: Johnn Hai, MD;  Location: [facility];  Service: Orthopedics;   Laterality: Left;  Excision of Trochanteric Bursitis  . FLEXIBLE SIGMOIDOSCOPY N/A 03/22/2013   Procedure: FLEXIBLE SIGMOIDOSCOPY;  Surgeon: Danie Binder, MD;  [facility];  Service: Endoscopy;  Laterality: N/A;  2:00  . LAPAROSCOPIC LOW ANTERIOR RESECTION  07/02/2012   Procedure: LAPAROSCOPIC LOW ANTERIOR RESECTION;  Surgeon: Stark Klein, MD;  [facility]: [facility];  Service: General;  Laterality: N/A;  . left ankle surgery for fx  several yrs ago  . LUMBAR LAMINECTOMY/DECOMPRESSION MICRODISCECTOMY N/A 05/14/2014   Procedure: LUMBAR DECOMPRESSION L2-L3;  Surgeon: Johnn Hai, MD;  [facility];  Service: Orthopedics;  Laterality: N/A;  . MASS EXCISION Left 02/02/2018   Procedure: EXCISION CYST, LEFT INNER THIGH;  Surgeon: Aviva Signs, MD;  Location: [facility];  Service: General;  Laterality: Left;  . PROCTOSCOPY  09/07/2012   Procedure: PROCTOSCOPY;  Surgeon: Stark Klein, MD;  Location: [facility];  Service: General;  Laterality: N/A;    There were no vitals filed for this visit.                    [facility] Adult PT Treatment/Exercise - 05/31/18 0001      Modalities   Modalities  Electrical Stimulation;Vasopneumatic      Electrical Stimulation   Electrical Stimulation Location  L ankle IFC 80-150hz  x15 mins  Electrical Stimulation Goals  Pain;Edema      Vasopneumatic   Number Minutes Vasopneumatic   15 minutes    Vasopnuematic Location   Ankle    Vasopneumatic Pressure  Low    Vasopneumatic Temperature   34      Ankle Exercises: Seated   Ankle Circles/Pumps  AROM;Left;20 reps    Heel Raises  Both;Other (comment)   3x10 reps   Toe Raise  Other (comment)   x30 reps   Other Seated Ankle Exercises  Rockerboard Df/Pf x3 min, EV/INV  x 3 mins    Other Seated Ankle Exercises  Dyandisc DF/Pf x3 min, circles x3 min               PT Short Term Goals - 05/28/18 1509      PT SHORT TERM GOAL #1   Title  Patient will be independent  with HEP.    Time  2    Period  Weeks    Status  Achieved      PT SHORT TERM GOAL #2   Title  Patient will demonstrate proper stair negotiation with one axillary crutch to safely enter and exit home    Time  2    Period  Weeks    Status  On-going      PT SHORT TERM GOAL #3   Title  Patient will ambulate with CAM boot and 1 axillary crutch to improve gait.    Time  2    Period  Weeks    Status  Achieved        PT Long Term Goals - 05/28/18 1509      PT LONG TERM GOAL #1   Title  Patient will demonstrate 4 degrees or greater of left DF AROM to improve gait mechanics    Time  4    Period  Weeks    Status  On-going      PT LONG TERM GOAL #2   Title  Patient will demonstrate 4/5 or greater left ankle MMT in all planes to improve stability during functional activities.    Time  4    Period  Weeks    Status  On-going      PT LONG TERM GOAL #3   Title  Patient will demonstrate a normalized gait pattern with no AD to improve gait mechanics.    Time  4    Status  On-going      PT LONG TERM GOAL #4   Title  Patient will demonstrate reciprocating stair negotiation with one railing to safely enter and exit home.    Time  4    Period  Weeks    Status  On-going      PT LONG TERM GOAL #5   Title  Patient will report ability to peform ADLs independently with pain less than or equal to 3/10 in left ankle.    Time  4    Period  Weeks    Status  On-going            Plan - 05/31/18 1756    Clinical Impression Statement  Pt arrived today after RTW 3 days now. She was had increased swelling due to dependent position at work. She wa able to perform all AROM Exs today and did well She experienced  pain at first with exs, but decreased after exs. Pt advised to perform active  ROM exs 2-3 x at work.. Normal modality response today    Clinical Presentation  Stable    Clinical Decision Making  Low    Rehab Potential  Good    PT Frequency  3x / week    PT Duration  4 weeks    PT  Treatment/Interventions  ADLs/Self Care Home Management;Gait training;Stair training;Neuromuscular re-education;Passive range of motion;Manual techniques;Functional mobility training;Therapeutic activities;Therapeutic exercise;Moist Heat;Cryotherapy;Electrical Stimulation;Balance training;Taping;Vasopneumatic Device;Patient/family education    PT Next Visit Plan  Continue with seated ROM exercises and possible gait training to progress without AD. Assess response to return to work.    PT Home Exercise Plan  see patient education section    Consulted and Agree with Plan of Care  Patient       Patient will benefit from skilled therapeutic intervention in order to improve the following deficits and impairments:  Pain, Decreased activity tolerance, Decreased range of motion, Decreased strength, Postural dysfunction, Decreased balance, Difficulty walking, Increased edema, Decreased endurance  Visit Diagnosis: Pain in left ankle and joints of left foot  Difficulty in walking, not elsewhere classified  Muscle weakness (generalized)  Unsteadiness on feet     Problem List Patient Active Problem List   Diagnosis Date Noted  . Closed fracture of left distal fibula 04/27/2018  . Sebaceous cyst   . Chronic low back pain 09/01/2017  . Migraine without aura 08/10/2017  . Sleep disorder 07/10/2017  . 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 Headache disorder 07/10/2017  . GERD (gastroesophageal reflux disease) 05/03/2017  . HNP (herniated nucleus pulposus), lumbar 05/14/2014  . Internal and external prolapsed hemorrhoids 03/15/2013  . Anxiety state, unspecified 03/15/2013  . Dysuria 07/23/2012  . Rectal cancer, cT2N0, 7 cm from anal verge 06/11/2012  . Hematochezia 05/25/2012  . Constipation 05/25/2012    RAMSEUR,CHRIS, PTA 05/31/2018, 6:02 PM  [facility] [address] [place], [place], [address] Phone: [phone]   Fax:   [phone]  Name: [name] MRN: [id number] Date of Birth: 05-20-1966

## 2018-06-05 ENCOUNTER — Encounter: Payer: Self-pay | Admitting: Physical Therapy

## 2018-06-05 ENCOUNTER — Ambulatory Visit: Payer: 59 | Admitting: Physical Therapy

## 2018-06-05 DIAGNOSIS — M6281 Muscle weakness (generalized): Secondary | ICD-10-CM

## 2018-06-05 DIAGNOSIS — R2681 Unsteadiness on feet: Secondary | ICD-10-CM

## 2018-06-05 DIAGNOSIS — M25572 Pain in left ankle and joints of left foot: Secondary | ICD-10-CM | POA: Diagnosis not present

## 2018-06-05 DIAGNOSIS — R262 Difficulty in walking, not elsewhere classified: Secondary | ICD-10-CM

## 2018-06-05 NOTE — Therapy (Signed)
[facility] [place], [place], [address] Phone: [phone]   Fax:  [phone]  Physical Therapy Treatment  Patient Details  Name: [name] MRN: [id number] Date of Birth: Feb 23, 1966 Referring Provider (PT): Victorino December, MD   Encounter Date: 06/05/2018  PT End of Session - 06/05/18 1650    Visit Number  5    Number of Visits  12    Date for PT Re-Evaluation  06/25/18    PT Start Time  1324    PT Stop Time  1731    PT Time Calculation (min)  43 min    Equipment Utilized During Treatment  Other (comment)   L CAM boot, unilateral axillary crutch   Activity Tolerance  Patient tolerated treatment well;Patient limited by pain    Behavior During Therapy  [facility] for tasks assessed/performed       Past Medical History:  Diagnosis Date  . Anxiety    NEW-DUE TO ANXIETY OVER DX OF CANCER  . Blood transfusion 1995  . Cancer (HCC)    RECTAL CANCER  . Closed fracture of left distal fibula 04/27/2018  . Constipation    SOME BLOOD IN STOOL  . GERD (gastroesophageal reflux disease)    occasionally-NO MEDS  . History of kidney stones   . Kidney stone   . PONV (postoperative nausea and vomiting)    used a scop patch last surgery-was better    Past Surgical History:  Procedure Laterality Date  . ABDOMINAL HYSTERECTOMY  1995   partial  . BACK SURGERY  2015  . COLON SURGERY    . COLONOSCOPY  06/01/2012   Procedure: COLONOSCOPY;  Surgeon: Danie Binder, MD;  [facility];  Service: Endoscopy;  Laterality: N/A;  1:30PM  . COLONOSCOPY N/A 06/14/2013   MWN:UUVO diverticulosis/normal anastomosis  . endoscopic left fallopian tube removed  several yrs ago  . EUS  06/06/2012   Procedure: LOWER ENDOSCOPIC ULTRASOUND (EUS);  Surgeon: Arta Silence, MD;  [facility];  Service: Endoscopy;  Laterality: N/A;  . EXAMINATION UNDER ANESTHESIA  09/07/2012   Procedure: EXAM UNDER ANESTHESIA;  Surgeon: Stark Klein, MD;  Location: [facility];  Service: General;  Laterality: N/A;  . EXCISION/RELEASE BURSA HIP  09/15/2011   Dr Tonita Cong EXCISION/RELEASE BURSA HIP;  Surgeon: Johnn Hai, MD;  [facility];  Service: Orthopedics;  Laterality: Left;  Excision of Trochanteric Bursitis  . FLEXIBLE SIGMOIDOSCOPY N/A 03/22/2013   Procedure: FLEXIBLE SIGMOIDOSCOPY;  Surgeon: Danie Binder, MD;  [facility];  Service: Endoscopy;  Laterality: N/A;  2:00  . LAPAROSCOPIC LOW ANTERIOR RESECTION  07/02/2012   Procedure: LAPAROSCOPIC LOW ANTERIOR RESECTION;  Surgeon: Stark Klein, MD;  [facility];  Service: General;  Laterality: N/A;  . left ankle surgery for fx  several yrs ago  . LUMBAR LAMINECTOMY/DECOMPRESSION MICRODISCECTOMY N/A 05/14/2014   Procedure: LUMBAR DECOMPRESSION L2-L3;  Surgeon: Johnn Hai, MD;  [facility];  Service: Orthopedics;  Laterality: N/A;  . MASS EXCISION Left 02/02/2018   Procedure: EXCISION CYST, LEFT INNER THIGH;  Surgeon: Aviva Signs, MD;  [facility];  Service: General;  Laterality: Left;  . PROCTOSCOPY  09/07/2012   Procedure: PROCTOSCOPY;  Surgeon: Stark Klein, MD;  Location: [facility];  Service: General;  Laterality: N/A;    There were no vitals filed for this visit.  Subjective Assessment - 06/05/18 1649    Subjective  Reports that her ankle has  felt tired and tender along lateral side. Reports that throbbing occurs as well. Has been taking periods of time without CAM boot while seated at work. Reports she has noticed her L calf is more flabby than the R.    Pertinent History  Left distal fibula fracture 04/27/18    Limitations  House hold activities;Standing;Walking    Diagnostic tests  x-ray    Patient Stated Goals  decrease pain and return to normal    Currently in Pain?  Yes    Pain Score  6     Pain Location  Ankle    Pain Orientation  Left;Anterior;Posterior;Lateral    Pain Descriptors / Indicators   Throbbing    Pain Type  Acute pain    Pain Onset  1 to 4 weeks ago         [facility] PT Assessment - 06/05/18 0001      Assessment   Medical Diagnosis  Closed fracture of left distal fibula    Onset Date/Surgical Date  04/27/18    Next MD Visit  06/13/2018    Prior Therapy  no      Precautions   Precautions  Other (comment)    Precaution Comments  No WB out of CAM boot      Restrictions   Other Position/Activity Restrictions  WBAT in CAM boot                   [facility] Adult PT Treatment/Exercise - 06/05/18 0001      Modalities   Modalities  Electrical Stimulation;Vasopneumatic      Electrical Stimulation   Electrical Stimulation Location  L ankle     Electrical Stimulation Action  IFC    Electrical Stimulation Parameters  80-150 hz x15 min    Electrical Stimulation Goals  Pain;Edema      Vasopneumatic   Number Minutes Vasopneumatic   15 minutes    Vasopnuematic Location   Ankle    Vasopneumatic Pressure  Low    Vasopneumatic Temperature   34      Ankle Exercises: Seated   Ankle Circles/Pumps  AROM;Left;20 reps    Heel Raises  Left;20 reps    Toe Raise  20 reps;Other (comment)   LLE   Heel Slides  Left;20 reps    Other Seated Ankle Exercises  Rockerboard Df/Pf x5 min, EV/INV  x 3 mins    Other Seated Ankle Exercises  Dyandisc DF/Pf x3 min, circles x3 min               PT Short Term Goals - 05/28/18 1509      PT SHORT TERM GOAL #1   Title  Patient will be independent with HEP.    Time  2    Period  Weeks    Status  Achieved      PT SHORT TERM GOAL #2   Title  Patient will demonstrate proper stair negotiation with one axillary crutch to safely enter and exit home    Time  2    Period  Weeks    Status  On-going      PT SHORT TERM GOAL #3   Title  Patient will ambulate with CAM boot and 1 axillary crutch to improve gait.    Time  2    Period  Weeks    Status  Achieved        PT Long Term Goals - 05/28/18 1509      PT LONG TERM GOAL  #1  Title  Patient will demonstrate 4 degrees or greater of left DF AROM to improve gait mechanics    Time  4    Period  Weeks    Status  On-going      PT LONG TERM GOAL #2   Title  Patient will demonstrate 4/5 or greater left ankle MMT in all planes to improve stability during functional activities.    Time  4    Period  Weeks    Status  On-going      PT LONG TERM GOAL #3   Title  Patient will demonstrate a normalized gait pattern with no AD to improve gait mechanics.    Time  4    Status  On-going      PT LONG TERM GOAL #4   Title  Patient will demonstrate reciprocating stair negotiation with one railing to safely enter and exit home.    Time  4    Period  Weeks    Status  On-going      PT LONG TERM GOAL #5   Title  Patient will report ability to peform ADLs independently with pain less than or equal to 3/10 in left ankle.    Time  4    Period  Weeks    Status  On-going            Plan - 06/05/18 1746    Clinical Impression Statement  Patient arrived in clinic with reports of increased L ankle pain and edema especially with posterior to lateral malleoli. Patient visibly made faces intermittantly with exercises completed in sitting. Patient educated that muscle atrophy is normal occurance and that with strengthening and training outside of CAM boot that strength will return. Patient still compliant with HEP and CAM boot use. Normal modalities response noted following removal of the modalities.    Rehab Potential  Good    PT Frequency  3x / week    PT Duration  4 weeks    PT Treatment/Interventions  ADLs/Self Care Home Management;Gait training;Stair training;Neuromuscular re-education;Passive range of motion;Manual techniques;Functional mobility training;Therapeutic activities;Therapeutic exercise;Moist Heat;Cryotherapy;Electrical Stimulation;Balance training;Taping;Vasopneumatic Device;Patient/family education    PT Next Visit Plan  Continue with seated ROM exercises and  possible gait training to progress without AD. Assess response to return to work.    PT Home Exercise Plan  see patient education section    Consulted and Agree with Plan of Care  Patient       Patient will benefit from skilled therapeutic intervention in order to improve the following deficits and impairments:  Pain, Decreased activity tolerance, Decreased range of motion, Decreased strength, Postural dysfunction, Decreased balance, Difficulty walking, Increased edema, Decreased endurance  Visit Diagnosis: Pain in left ankle and joints of left foot  Difficulty in walking, not elsewhere classified  Muscle weakness (generalized)  Unsteadiness on feet     Problem List Patient Active Problem List   Diagnosis Date Noted  . Closed fracture of left distal fibula 04/27/2018  . Sebaceous cyst   . Chronic low back pain 09/01/2017  . Migraine without aura 08/10/2017  . Sleep disorder 07/10/2017  . 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 Headache disorder 07/10/2017  . GERD (gastroesophageal reflux disease) 05/03/2017  . HNP (herniated nucleus pulposus), lumbar 05/14/2014  . Internal and external prolapsed hemorrhoids 03/15/2013  . Anxiety state, unspecified 03/15/2013  . Dysuria 07/23/2012  . Rectal cancer, cT2N0, 7 cm from anal verge 06/11/2012  . Hematochezia 05/25/2012  . Constipation 05/25/2012  Standley Brooking, PTA 06/05/2018, 5:53 PM  [facility] [address] [place], [place], [address] Phone: [phone]   Fax:  [phone]  Name: [name] MRN: [id number] Date of Birth: 12-Oct-1965

## 2018-06-07 ENCOUNTER — Ambulatory Visit: Payer: 59 | Admitting: Physical Therapy

## 2018-06-07 ENCOUNTER — Encounter: Payer: Self-pay | Admitting: Physical Therapy

## 2018-06-07 DIAGNOSIS — M25572 Pain in left ankle and joints of left foot: Secondary | ICD-10-CM

## 2018-06-07 DIAGNOSIS — R262 Difficulty in walking, not elsewhere classified: Secondary | ICD-10-CM

## 2018-06-07 DIAGNOSIS — R2681 Unsteadiness on feet: Secondary | ICD-10-CM

## 2018-06-07 DIAGNOSIS — M6281 Muscle weakness (generalized): Secondary | ICD-10-CM

## 2018-06-07 NOTE — Therapy (Signed)
[facility] [place], [place], [address] Phone: [phone]   Fax:  [phone]  Physical Therapy Treatment  Patient Details  Name: [name] MRN: [id number] Date of Birth: [birth year] Referring Provider (PT): Victorino December, MD   Encounter Date: 06/07/2018  PT End of Session - 06/07/18 1652    Visit Number  6    Number of Visits  12    Date for PT Re-Evaluation  06/25/18    PT Start Time  1157    PT Stop Time  2620    PT Time Calculation (min)  46 min    Equipment Utilized During Treatment  Other (comment)   L CAM boot, axillary crutch   Activity Tolerance  Patient tolerated treatment well;Patient limited by pain    Behavior During Therapy  [facility] for tasks assessed/performed       Past Medical History:  Diagnosis Date  . Anxiety    NEW-DUE TO ANXIETY OVER DX OF CANCER  . Blood transfusion 1995  . Cancer (HCC)    RECTAL CANCER  . Closed fracture of left distal fibula 04/27/2018  . Constipation    SOME BLOOD IN STOOL  . GERD (gastroesophageal reflux disease)    occasionally-NO MEDS  . History of kidney stones   . Kidney stone   . PONV (postoperative nausea and vomiting)    used a scop patch last surgery-was better    Past Surgical History:  Procedure Laterality Date  . ABDOMINAL HYSTERECTOMY  1995   partial  . BACK SURGERY  2015  . COLON SURGERY    . COLONOSCOPY  06/01/2012   Procedure: COLONOSCOPY;  Surgeon: Danie Binder, MD;  [facility];  Service: Endoscopy;  Laterality: N/A;  1:30PM  . COLONOSCOPY N/A 06/14/2013   BTD:HRCB diverticulosis/normal anastomosis  . endoscopic left fallopian tube removed  several yrs ago  . EUS  06/06/2012   Procedure: LOWER ENDOSCOPIC ULTRASOUND (EUS);  Surgeon: Arta Silence, MD;  Location: [facility];  Service: Endoscopy;  Laterality: N/A;  . EXAMINATION UNDER ANESTHESIA  09/07/2012   Procedure: EXAM UNDER ANESTHESIA;  Surgeon: Stark Klein, MD;   Location: [place];  Service: General;  Laterality: N/A;  . EXCISION/RELEASE BURSA HIP  09/15/2011   Dr Tonita Cong EXCISION/RELEASE BURSA HIP;  Surgeon: Johnn Hai, MD;  [facility];  Service: Orthopedics;  Laterality: Left;  Excision of Trochanteric Bursitis  . FLEXIBLE SIGMOIDOSCOPY N/A 03/22/2013   Procedure: FLEXIBLE SIGMOIDOSCOPY;  Surgeon: Danie Binder, MD;  [facility];  Service: Endoscopy;  Laterality: N/A;  2:00  . LAPAROSCOPIC LOW ANTERIOR RESECTION  07/02/2012   Procedure: LAPAROSCOPIC LOW ANTERIOR RESECTION;  Surgeon: Stark Klein, MD;  [facility];  Service: General;  Laterality: N/A;  . left ankle surgery for fx  several yrs ago  . LUMBAR LAMINECTOMY/DECOMPRESSION MICRODISCECTOMY N/A 05/14/2014   Procedure: LUMBAR DECOMPRESSION L2-L3;  Surgeon: Johnn Hai, MD;  [facility];  Service: Orthopedics;  Laterality: N/A;  . MASS EXCISION Left 02/02/2018   Procedure: EXCISION CYST, LEFT INNER THIGH;  Surgeon: Aviva Signs, MD;  [facility];  Service: General;  Laterality: Left;  . PROCTOSCOPY  09/07/2012   Procedure: PROCTOSCOPY;  Surgeon: Stark Klein, MD;  Location: [place];  Service: General;  Laterality: N/A;    There were no vitals filed for this visit.  Subjective Assessment - 06/07/18 1650    Subjective  Reports increased tenderness and pain along  her lateral ankle. Reports increased edema as well. Has a prop to put her foot up on during the day but cannot soak her foot like she was when she was home.    Pertinent History  Left distal fibula fracture 04/27/18    Limitations  House hold activities;Standing;Walking    Diagnostic tests  x-ray    Patient Stated Goals  decrease pain and return to normal    Currently in Pain?  Yes    Pain Score  4     Pain Location  Ankle    Pain Orientation  Left    Pain Descriptors / Indicators  Throbbing    Pain Type  Acute pain    Pain Onset  1 to 4 weeks ago          [facility] PT Assessment - 06/07/18 0001      Assessment   Medical Diagnosis  Closed fracture of left distal fibula    Onset Date/Surgical Date  04/27/18    Next MD Visit  06/13/2018    Prior Therapy  no      Precautions   Precautions  Other (comment)    Precaution Comments  No WB out of CAM boot      Restrictions   Other Position/Activity Restrictions  WBAT in CAM boot                   [facility] Adult PT Treatment/Exercise - 06/07/18 0001      Modalities   Modalities  Electrical Stimulation;Vasopneumatic      Electrical Stimulation   Electrical Stimulation Location  L ankle     Electrical Stimulation Action  IFC    Electrical Stimulation Parameters  80-150 hz x15 min    Electrical Stimulation Goals  Pain;Edema      Vasopneumatic   Number Minutes Vasopneumatic   15 minutes    Vasopnuematic Location   Ankle    Vasopneumatic Pressure  Low    Vasopneumatic Temperature   34      Ankle Exercises: Seated   Ankle Circles/Pumps  AROM;Left;20 reps    Towel Crunch  Other (comment)   x3 min   Heel Raises  Left;20 reps    Toe Raise  20 reps    BAPS  Sitting;Level 2;Other (comment)   x3 min DF/PF   Heel Slides  Left;20 reps    Other Seated Ankle Exercises  Rockerboard Df/Pf x3 min, EV/INV  x 2 mins    Other Seated Ankle Exercises  Dyandisc DF/Pf x2 min, inv/ev x1 min, circles x2 min               PT Short Term Goals - 05/28/18 1509      PT SHORT TERM GOAL #1   Title  Patient will be independent with HEP.    Time  2    Period  Weeks    Status  Achieved      PT SHORT TERM GOAL #2   Title  Patient will demonstrate proper stair negotiation with one axillary crutch to safely enter and exit home    Time  2    Period  Weeks    Status  On-going      PT SHORT TERM GOAL #3   Title  Patient will ambulate with CAM boot and 1 axillary crutch to improve gait.    Time  2    Period  Weeks    Status  Achieved        PT Long  Term Goals - 05/28/18 1509       PT LONG TERM GOAL #1   Title  Patient will demonstrate 4 degrees or greater of left DF AROM to improve gait mechanics    Time  4    Period  Weeks    Status  On-going      PT LONG TERM GOAL #2   Title  Patient will demonstrate 4/5 or greater left ankle MMT in all planes to improve stability during functional activities.    Time  4    Period  Weeks    Status  On-going      PT LONG TERM GOAL #3   Title  Patient will demonstrate a normalized gait pattern with no AD to improve gait mechanics.    Time  4    Status  On-going      PT LONG TERM GOAL #4   Title  Patient will demonstrate reciprocating stair negotiation with one railing to safely enter and exit home.    Time  4    Period  Weeks    Status  On-going      PT LONG TERM GOAL #5   Title  Patient will report ability to peform ADLs independently with pain less than or equal to 3/10 in left ankle.    Time  4    Period  Weeks    Status  On-going            Plan - 06/07/18 1741    Clinical Impression Statement  Patient presented in clinic with increased L ankle edema as well as tenderness to lateral L ankle. Patient able to gently completed exercises but reported discomfort along medial ankle. Patient very guarded and hesistant with exercises again today. Patient very anxious to get rid of CAM boot as well as axillary crutch but does not want to rush recovery and risk reinjury. Normal modalities response noted following removal of the modalities.    Rehab Potential  Good    PT Frequency  3x / week    PT Duration  4 weeks    PT Treatment/Interventions  ADLs/Self Care Home Management;Gait training;Stair training;Neuromuscular re-education;Passive range of motion;Manual techniques;Functional mobility training;Therapeutic activities;Therapeutic exercise;Moist Heat;Cryotherapy;Electrical Stimulation;Balance training;Taping;Vasopneumatic Device;Patient/family education    PT Next Visit Plan  MD note required next visit.    PT Home  Exercise Plan  see patient education section    Consulted and Agree with Plan of Care  Patient       Patient will benefit from skilled therapeutic intervention in order to improve the following deficits and impairments:  Pain, Decreased activity tolerance, Decreased range of motion, Decreased strength, Postural dysfunction, Decreased balance, Difficulty walking, Increased edema, Decreased endurance  Visit Diagnosis: Pain in left ankle and joints of left foot  Difficulty in walking, not elsewhere classified  Muscle weakness (generalized)  Unsteadiness on feet     Problem List Patient Active Problem List   Diagnosis Date Noted  . Closed fracture of left distal fibula 04/27/2018  . Sebaceous cyst   . Chronic low back pain 09/01/2017  . Migraine without aura 08/10/2017  . Sleep disorder 07/10/2017  . 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 Headache disorder 07/10/2017  . GERD (gastroesophageal reflux disease) 05/03/2017  . HNP (herniated nucleus pulposus), lumbar 05/14/2014  . Internal and external prolapsed hemorrhoids 03/15/2013  . Anxiety state, unspecified 03/15/2013  . Dysuria 07/23/2012  . Rectal cancer, cT2N0, 7 cm from anal verge 06/11/2012  . Hematochezia  05/25/2012  . Constipation 05/25/2012    Standley Brooking, PTA 06/07/2018, 5:58 PM  [facility] [address] [place], [place], [address] Phone: [phone]   Fax:  [phone]  Name: [name] MRN: [id number] Date of Birth: 11-17-1965

## 2018-06-12 ENCOUNTER — Ambulatory Visit: Payer: 59 | Admitting: *Deleted

## 2018-06-12 ENCOUNTER — Ambulatory Visit: Payer: No Typology Code available for payment source | Admitting: *Deleted

## 2018-06-12 DIAGNOSIS — R2681 Unsteadiness on feet: Secondary | ICD-10-CM

## 2018-06-12 DIAGNOSIS — M6281 Muscle weakness (generalized): Secondary | ICD-10-CM

## 2018-06-12 DIAGNOSIS — R262 Difficulty in walking, not elsewhere classified: Secondary | ICD-10-CM

## 2018-06-12 DIAGNOSIS — M25572 Pain in left ankle and joints of left foot: Secondary | ICD-10-CM

## 2018-06-12 NOTE — Therapy (Signed)
[facility] [place], [place], [address] Phone: [phone]   Fax:  [phone]  Physical Therapy Treatment  Patient Details  Name: [name] MRN: [id number] Date of Birth: Dec 14, 1965 Referring Provider (PT): Victorino December, MD   Encounter Date: 06/12/2018  PT End of Session - 06/12/18 1703    Visit Number  7    Number of Visits  12    Date for PT Re-Evaluation  06/25/18    PT Start Time  4235    PT Stop Time  1739    PT Time Calculation (min)  54 min       Past Medical History:  Diagnosis Date  . Anxiety    NEW-DUE TO ANXIETY OVER DX OF CANCER  . Blood transfusion 1995  . Cancer (HCC)    RECTAL CANCER  . Closed fracture of left distal fibula 04/27/2018  . Constipation    SOME BLOOD IN STOOL  . GERD (gastroesophageal reflux disease)    occasionally-NO MEDS  . History of kidney stones   . Kidney stone   . PONV (postoperative nausea and vomiting)    used a scop patch last surgery-was better    Past Surgical History:  Procedure Laterality Date  . ABDOMINAL HYSTERECTOMY  1995   partial  . BACK SURGERY  2015  . COLON SURGERY    . COLONOSCOPY  06/01/2012   Procedure: COLONOSCOPY;  Surgeon: Danie Binder, MD;  [facility];  Service: Endoscopy;  Laterality: N/A;  1:30PM  . COLONOSCOPY N/A 06/14/2013   TIR:WERX diverticulosis/normal anastomosis  . endoscopic left fallopian tube removed  several yrs ago  . EUS  06/06/2012   Procedure: LOWER ENDOSCOPIC ULTRASOUND (EUS);  Surgeon: Arta Silence, MD;  [facility];  Service: Endoscopy;  Laterality: N/A;  . EXAMINATION UNDER ANESTHESIA  09/07/2012   Procedure: EXAM UNDER ANESTHESIA;  Surgeon: Stark Klein, MD;  Location: [place];  Service: General;  Laterality: N/A;  . EXCISION/RELEASE BURSA HIP  09/15/2011   Dr Tonita Cong EXCISION/RELEASE BURSA HIP;  Surgeon: Johnn Hai, MD;  [facility];  Service: Orthopedics;   Laterality: Left;  Excision of Trochanteric Bursitis  . FLEXIBLE SIGMOIDOSCOPY N/A 03/22/2013   Procedure: FLEXIBLE SIGMOIDOSCOPY;  Surgeon: Danie Binder, MD;  [facility];  Service: Endoscopy;  Laterality: N/A;  2:00  . LAPAROSCOPIC LOW ANTERIOR RESECTION  07/02/2012   Procedure: LAPAROSCOPIC LOW ANTERIOR RESECTION;  Surgeon: Stark Klein, MD;  [facility];  Service: General;  Laterality: N/A;  . left ankle surgery for fx  several yrs ago  . LUMBAR LAMINECTOMY/DECOMPRESSION MICRODISCECTOMY N/A 05/14/2014   Procedure: LUMBAR DECOMPRESSION L2-L3;  Surgeon: Johnn Hai, MD;  [facility];  Service: Orthopedics;  Laterality: N/A;  . MASS EXCISION Left 02/02/2018   Procedure: EXCISION CYST, LEFT INNER THIGH;  Surgeon: Aviva Signs, MD;  [facility];  Service: General;  Laterality: Left;  . PROCTOSCOPY  09/07/2012   Procedure: PROCTOSCOPY;  Surgeon: Stark Klein, MD;  Location: [place];  Service: General;  Laterality: N/A;    There were no vitals filed for this visit.  Subjective Assessment - 06/12/18 1700    Subjective  To MD tomorrow 10:15. LT ankle continues to swell and hurts on both sides and front part    Pertinent History  Left distal fibula fracture 04/27/18    Limitations  House hold activities;Standing;Walking    Diagnostic tests  x-ray  Patient Stated Goals  decrease pain and return to normal    Currently in Pain?  Yes    Pain Score  7     Pain Location  Ankle    Pain Orientation  Left    Pain Descriptors / Indicators  Throbbing    Pain Type  Acute pain    Pain Onset  1 to 4 weeks ago    Pain Frequency  Intermittent         [facility] PT Assessment - 06/12/18 0001      AROM   Right/Left Ankle  Left    Left Ankle Dorsiflexion  15    Left Ankle Plantar Flexion  40    Left Ankle Inversion  30    Left Ankle Eversion  20                   [facility] Adult PT Treatment/Exercise - 06/12/18 0001      Modalities    Modalities  Electrical Stimulation;Vasopneumatic      Electrical Stimulation   Electrical Stimulation Location  L ankle IFC x 1-10hz  x 15 mins    Electrical Stimulation Goals  Pain;Edema      Vasopneumatic   Number Minutes Vasopneumatic   15 minutes    Vasopnuematic Location   Ankle    Vasopneumatic Pressure  Low    Vasopneumatic Temperature   34      Manual Therapy   Manual Therapy  Passive ROM    Passive ROM  Gentle PROM for increased DF ROM      Ankle Exercises: Seated   Heel Raises  Left;20 reps    Toe Raise  20 reps    BAPS  Sitting;Level 2;Other (comment)   x2 min DF/PF, CW and CCW x 68mins each   Other Seated Ankle Exercises  Rockerboard Df/Pf x3 min, EV/INV  x 3 mins    Other Seated Ankle Exercises  Dyandisc DF/Pf x2 min, inv/ev x1 min, circles x2 min               PT Short Term Goals - 05/28/18 1509      PT SHORT TERM GOAL #1   Title  Patient will be independent with HEP.    Time  2    Period  Weeks    Status  Achieved      PT SHORT TERM GOAL #2   Title  Patient will demonstrate proper stair negotiation with one axillary crutch to safely enter and exit home    Time  2    Period  Weeks    Status  On-going      PT SHORT TERM GOAL #3   Title  Patient will ambulate with CAM boot and 1 axillary crutch to improve gait.    Time  2    Period  Weeks    Status  Achieved        PT Long Term Goals - 05/28/18 1509      PT LONG TERM GOAL #1   Title  Patient will demonstrate 4 degrees or greater of left DF AROM to improve gait mechanics    Time  4    Period  Weeks    Status  On-going      PT LONG TERM GOAL #2   Title  Patient will demonstrate 4/5 or greater left ankle MMT in all planes to improve stability during functional activities.    Time  4    Period  Weeks  Status  On-going      PT LONG TERM GOAL #3   Title  Patient will demonstrate a normalized gait pattern with no AD to improve gait mechanics.    Time  4    Status  On-going      PT LONG  TERM GOAL #4   Title  Patient will demonstrate reciprocating stair negotiation with one railing to safely enter and exit home.    Time  4    Period  Weeks    Status  On-going      PT LONG TERM GOAL #5   Title  Patient will report ability to peform ADLs independently with pain less than or equal to 3/10 in left ankle.    Time  4    Period  Weeks    Status  On-going            Plan - 06/12/18 1704    Clinical Impression Statement  Pt arrived today reporting that she has MD appt in the AM. She feels that overall she is doing better, but continues to have swelling and discomfort around LT ankle. She was able to perform seated AROM exs today with mainly soreness/ discomfort. Her pain was 3-4/10 today, but rises to 7/10 at times when at work.. Her AROM has improved greatly since initial  Eval.. Normal modality response today. Send MD note    Clinical Presentation  Stable    Rehab Potential  Good    PT Frequency  3x / week    PT Duration  4 weeks    PT Treatment/Interventions  ADLs/Self Care Home Management;Gait training;Stair training;Neuromuscular re-education;Passive range of motion;Manual techniques;Functional mobility training;Therapeutic activities;Therapeutic exercise;Moist Heat;Cryotherapy;Electrical Stimulation;Balance training;Taping;Vasopneumatic Device;Patient/family education    PT Next Visit Plan  MD note    PT Home Exercise Plan  see patient education section    Consulted and Agree with Plan of Care  Patient       Patient will benefit from skilled therapeutic intervention in order to improve the following deficits and impairments:     Visit Diagnosis: Pain in left ankle and joints of left foot  Difficulty in walking, not elsewhere classified  Muscle weakness (generalized)  Unsteadiness on feet     Problem List Patient Active Problem List   Diagnosis Date Noted  . Closed fracture of left distal fibula 04/27/2018  . Sebaceous cyst   . Chronic low back pain  09/01/2017  . Migraine without aura 08/10/2017  . Sleep disorder 07/10/2017  . 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 Headache disorder 07/10/2017  . GERD (gastroesophageal reflux disease) 05/03/2017  . HNP (herniated nucleus pulposus), lumbar 05/14/2014  . Internal and external prolapsed hemorrhoids 03/15/2013  . Anxiety state, unspecified 03/15/2013  . Dysuria 07/23/2012  . Rectal cancer, cT2N0, 7 cm from anal verge 06/11/2012  . Hematochezia 05/25/2012  . Constipation 05/25/2012    Robel Wuertz,CHRIS, PTA 06/12/2018, 6:00 PM  [facility] [address] [place], [place], [address] Phone: [phone]   Fax:  [phone]  Name: Deissy Guilbert MRN: [id number] Date of Birth: 16-Dec-1965

## 2018-06-13 ENCOUNTER — Ambulatory Visit (INDEPENDENT_AMBULATORY_CARE_PROVIDER_SITE_OTHER): Payer: 59

## 2018-06-13 DIAGNOSIS — Z23 Encounter for immunization: Secondary | ICD-10-CM | POA: Diagnosis not present

## 2018-06-14 ENCOUNTER — Encounter: Payer: Self-pay | Admitting: Physical Therapy

## 2018-06-14 ENCOUNTER — Ambulatory Visit: Payer: 59

## 2018-06-19 ENCOUNTER — Ambulatory Visit: Payer: 59 | Attending: Orthopedic Surgery | Admitting: *Deleted

## 2018-06-19 DIAGNOSIS — M6281 Muscle weakness (generalized): Secondary | ICD-10-CM | POA: Diagnosis not present

## 2018-06-19 DIAGNOSIS — M25572 Pain in left ankle and joints of left foot: Secondary | ICD-10-CM | POA: Insufficient documentation

## 2018-06-19 DIAGNOSIS — R262 Difficulty in walking, not elsewhere classified: Secondary | ICD-10-CM | POA: Insufficient documentation

## 2018-06-19 DIAGNOSIS — R2681 Unsteadiness on feet: Secondary | ICD-10-CM | POA: Diagnosis present

## 2018-06-19 NOTE — Therapy (Signed)
[facility] [place], [place], [address] Phone: [phone]   Fax:  [phone]  Physical Therapy Treatment  Patient Details  Name: [name] MRN: [id number] Date of Birth: Jul 03, 1966 Referring Provider (PT): Victorino December, MD   Encounter Date: 06/19/2018  PT End of Session - 06/19/18 1700    Visit Number  8    Number of Visits  12    Date for PT Re-Evaluation  06/25/18    PT Start Time  0626    PT Stop Time  9485    PT Time Calculation (min)  52 min       Past Medical History:  Diagnosis Date  . Anxiety    NEW-DUE TO ANXIETY OVER DX OF CANCER  . Blood transfusion 1995  . Cancer (HCC)    RECTAL CANCER  . Closed fracture of left distal fibula 04/27/2018  . Constipation    SOME BLOOD IN STOOL  . GERD (gastroesophageal reflux disease)    occasionally-NO MEDS  . History of kidney stones   . Kidney stone   . PONV (postoperative nausea and vomiting)    used a scop patch last surgery-was better    Past Surgical History:  Procedure Laterality Date  . ABDOMINAL HYSTERECTOMY  1995   partial  . BACK SURGERY  2015  . COLON SURGERY    . COLONOSCOPY  06/01/2012   Procedure: COLONOSCOPY;  Surgeon: Danie Binder, MD;  [facility];  Service: Endoscopy;  Laterality: N/A;  1:30PM  . COLONOSCOPY N/A 06/14/2013   IOE:VOJJ diverticulosis/normal anastomosis  . endoscopic left fallopian tube removed  several yrs ago  . EUS  06/06/2012   Procedure: LOWER ENDOSCOPIC ULTRASOUND (EUS);  Surgeon: Arta Silence, MD;  Location: [facility];  Service: Endoscopy;  Laterality: N/A;  . EXAMINATION UNDER ANESTHESIA  09/07/2012   Procedure: EXAM UNDER ANESTHESIA;  Surgeon: Stark Klein, MD;  Location: [facility];  Service: General;  Laterality: N/A;  . EXCISION/RELEASE BURSA HIP  09/15/2011   Dr Tonita Cong EXCISION/RELEASE BURSA HIP;  Surgeon: Johnn Hai, MD;  [facility];  Service: Orthopedics;   Laterality: Left;  Excision of Trochanteric Bursitis  . FLEXIBLE SIGMOIDOSCOPY N/A 03/22/2013   Procedure: FLEXIBLE SIGMOIDOSCOPY;  Surgeon: Danie Binder, MD;  [facility];  Service: Endoscopy;  Laterality: N/A;  2:00  . LAPAROSCOPIC LOW ANTERIOR RESECTION  07/02/2012   Procedure: LAPAROSCOPIC LOW ANTERIOR RESECTION;  Surgeon: Stark Klein, MD;  [facility];  Service: General;  Laterality: N/A;  . left ankle surgery for fx  several yrs ago  . LUMBAR LAMINECTOMY/DECOMPRESSION MICRODISCECTOMY N/A 05/14/2014   Procedure: LUMBAR DECOMPRESSION L2-L3;  Surgeon: Johnn Hai, MD;  [facility];  Service: Orthopedics;  Laterality: N/A;  . MASS EXCISION Left 02/02/2018   Procedure: EXCISION CYST, LEFT INNER THIGH;  Surgeon: Aviva Signs, MD;  [facility];  Service: General;  Laterality: Left;  . PROCTOSCOPY  09/07/2012   Procedure: PROCTOSCOPY;  Surgeon: Stark Klein, MD;  Location: [facility];  Service: General;  Laterality: N/A;    There were no vitals filed for this visit.  Subjective Assessment - 06/19/18 1649    Subjective  Md said the bone was still healing and needed to cont with the boot until next MD visit first week in Dec. Wean off the crutch in 2 weeks.     Pertinent History  Left distal fibula fracture 04/27/18    Limitations  House hold activities;Standing;Walking    Diagnostic tests  x-ray    Patient Stated Goals  decrease pain and return to normal    Currently in Pain?  Yes    Pain Score  2     Pain Location  Ankle    Pain Orientation  Left    Pain Descriptors / Indicators  Throbbing    Pain Type  Acute pain    Pain Onset  1 to 4 weeks ago    Pain Frequency  Intermittent                       [facility] Adult PT Treatment/Exercise - 06/19/18 0001      Modalities   Modalities  Electrical Stimulation;Vasopneumatic      Electrical Stimulation   Electrical Stimulation Location  L ankle IFC x 1-10hz  x 15 mins    Electrical  Stimulation Goals  Pain;Edema      Vasopneumatic   Number Minutes Vasopneumatic   15 minutes    Vasopnuematic Location   Ankle    Vasopneumatic Pressure  Low    Vasopneumatic Temperature   34      Manual Therapy   Manual Therapy  Passive ROM    Passive ROM  Gentle PROM for increased DF ROM      Ankle Exercises: Seated   Heel Raises  Left;20 reps    Toe Raise  20 reps    BAPS  Sitting;Level 2;Other (comment)   x3 min DF/PF, CW and CCW x 60mins each   Other Seated Ankle Exercises  Rockerboard Df/Pf x3 min, EV/INV  x 3 mins    Other Seated Ankle Exercises  Dyandisc DF/Pf x3 min, inv/ev x3 min, circles x3 min               PT Short Term Goals - 05/28/18 1509      PT SHORT TERM GOAL #1   Title  Patient will be independent with HEP.    Time  2    Period  Weeks    Status  Achieved      PT SHORT TERM GOAL #2   Title  Patient will demonstrate proper stair negotiation with one axillary crutch to safely enter and exit home    Time  2    Period  Weeks    Status  On-going      PT SHORT TERM GOAL #3   Title  Patient will ambulate with CAM boot and 1 axillary crutch to improve gait.    Time  2    Period  Weeks    Status  Achieved        PT Long Term Goals - 05/28/18 1509      PT LONG TERM GOAL #1   Title  Patient will demonstrate 4 degrees or greater of left DF AROM to improve gait mechanics    Time  4    Period  Weeks    Status  On-going      PT LONG TERM GOAL #2   Title  Patient will demonstrate 4/5 or greater left ankle MMT in all planes to improve stability during functional activities.    Time  4    Period  Weeks    Status  On-going      PT LONG TERM GOAL #3   Title  Patient will demonstrate a normalized gait pattern with no AD to improve gait mechanics.    Time  4    Status  On-going      PT LONG TERM GOAL #4   Title  Patient will demonstrate reciprocating stair negotiation with one railing to safely enter and exit home.    Time  4    Period  Weeks     Status  On-going      PT LONG TERM GOAL #5   Title  Patient will report ability to peform ADLs independently with pain less than or equal to 3/10 in left ankle.    Time  4    Period  Weeks    Status  On-going            Plan - 06/19/18 1737    Clinical Impression Statement  Pt arrived today reporting that her fibula wasn't fully healed yet and MD wants her to wear the boot until next MD F/U first week in Dec. She also stated that she can wean from the crutch in the next 2 weeks to just the boot. MD to send a New Order for progression. Pt did well with increased time with sitting exs and will progress as soon as we receive updated Order. Normal modality response today    Clinical Presentation  Stable    Clinical Decision Making  Low    Rehab Potential  Good    PT Frequency  3x / week    PT Duration  4 weeks    PT Treatment/Interventions  ADLs/Self Care Home Management;Gait training;Stair training;Neuromuscular re-education;Passive range of motion;Manual techniques;Functional mobility training;Therapeutic activities;Therapeutic exercise;Moist Heat;Cryotherapy;Electrical Stimulation;Balance training;Taping;Vasopneumatic Device;Patient/family education    PT Next Visit Plan  MD note    PT Home Exercise Plan  see patient education section    Consulted and Agree with Plan of Care  Patient       Patient will benefit from skilled therapeutic intervention in order to improve the following deficits and impairments:  Pain, Decreased activity tolerance, Decreased range of motion, Decreased strength, Postural dysfunction, Decreased balance, Difficulty walking, Increased edema, Decreased endurance  Visit Diagnosis: Pain in left ankle and joints of left foot  Difficulty in walking, not elsewhere classified  Muscle weakness (generalized)  Unsteadiness on feet     Problem List Patient Active Problem List   Diagnosis Date Noted  . Closed fracture of left distal fibula 04/27/2018  .  Sebaceous cyst   . Chronic low back pain 09/01/2017  . Migraine without aura 08/10/2017  . Sleep disorder 07/10/2017  . 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 Headache disorder 07/10/2017  . GERD (gastroesophageal reflux disease) 05/03/2017  . HNP (herniated nucleus pulposus), lumbar 05/14/2014  . Internal and external prolapsed hemorrhoids 03/15/2013  . Anxiety state, unspecified 03/15/2013  . Dysuria 07/23/2012  . Rectal cancer, cT2N0, 7 cm from anal verge 06/11/2012  . Hematochezia 05/25/2012  . Constipation 05/25/2012    RAMSEUR,CHRIS, PTA 06/19/2018, 5:46 PM  [facility] [address] [place], [place], [address] Phone: [phone]   Fax:  [phone]  Name: Hephzibah Strehle MRN: [id number] Date of Birth: 1966/08/09

## 2018-06-21 ENCOUNTER — Ambulatory Visit: Payer: 59 | Admitting: *Deleted

## 2018-06-21 DIAGNOSIS — R262 Difficulty in walking, not elsewhere classified: Secondary | ICD-10-CM

## 2018-06-21 DIAGNOSIS — M25572 Pain in left ankle and joints of left foot: Secondary | ICD-10-CM | POA: Diagnosis not present

## 2018-06-21 DIAGNOSIS — R2681 Unsteadiness on feet: Secondary | ICD-10-CM

## 2018-06-21 DIAGNOSIS — M6281 Muscle weakness (generalized): Secondary | ICD-10-CM

## 2018-06-21 NOTE — Therapy (Signed)
[facility] [place], [place], [address] Phone: [phone]   Fax:  [phone]  Physical Therapy Treatment  Patient Details  Name: [name] MRN: [id number] Date of Birth: December 07, 1965 Referring Provider (PT): Victorino December, MD   Encounter Date: 06/21/2018  PT End of Session - 06/21/18 1741    Visit Number  9    Number of Visits  12    Date for PT Re-Evaluation  06/25/18    PT Start Time  5465    PT Stop Time  0354    PT Time Calculation (min)  56 min       Past Medical History:  Diagnosis Date  . Anxiety    NEW-DUE TO ANXIETY OVER DX OF CANCER  . Blood transfusion 1995  . Cancer (HCC)    RECTAL CANCER  . Closed fracture of left distal fibula 04/27/2018  . Constipation    SOME BLOOD IN STOOL  . GERD (gastroesophageal reflux disease)    occasionally-NO MEDS  . History of kidney stones   . Kidney stone   . PONV (postoperative nausea and vomiting)    used a scop patch last surgery-was better    Past Surgical History:  Procedure Laterality Date  . ABDOMINAL HYSTERECTOMY  1995   partial  . BACK SURGERY  2015  . COLON SURGERY    . COLONOSCOPY  06/01/2012   Procedure: COLONOSCOPY;  Surgeon: Danie Binder, MD;  [facility];  Service: Endoscopy;  Laterality: N/A;  1:30PM  . COLONOSCOPY N/A 06/14/2013   SFK:CLEX diverticulosis/normal anastomosis  . endoscopic left fallopian tube removed  several yrs ago  . EUS  06/06/2012   Procedure: LOWER ENDOSCOPIC ULTRASOUND (EUS);  Surgeon: Arta Silence, MD;  Location: [facility];  Service: Endoscopy;  Laterality: N/A;  . EXAMINATION UNDER ANESTHESIA  09/07/2012   Procedure: EXAM UNDER ANESTHESIA;  Surgeon: Stark Klein, MD;  Location: [facility];  Service: General;  Laterality: N/A;  . EXCISION/RELEASE BURSA HIP  09/15/2011   Dr Tonita Cong EXCISION/RELEASE BURSA HIP;  Surgeon: Johnn Hai, MD;  [facility];  Service: Orthopedics;   Laterality: Left;  Excision of Trochanteric Bursitis  . FLEXIBLE SIGMOIDOSCOPY N/A 03/22/2013   Procedure: FLEXIBLE SIGMOIDOSCOPY;  Surgeon: Danie Binder, MD;  [facility];  Service: Endoscopy;  Laterality: N/A;  2:00  . LAPAROSCOPIC LOW ANTERIOR RESECTION  07/02/2012   Procedure: LAPAROSCOPIC LOW ANTERIOR RESECTION;  Surgeon: Stark Klein, MD;  [facility];  Service: General;  Laterality: N/A;  . left ankle surgery for fx  several yrs ago  . LUMBAR LAMINECTOMY/DECOMPRESSION MICRODISCECTOMY N/A 05/14/2014   Procedure: LUMBAR DECOMPRESSION L2-L3;  Surgeon: Johnn Hai, MD;  [facility];  Service: Orthopedics;  Laterality: N/A;  . MASS EXCISION Left 02/02/2018   Procedure: EXCISION CYST, LEFT INNER THIGH;  Surgeon: Aviva Signs, MD;  [facility];  Service: General;  Laterality: Left;  . PROCTOSCOPY  09/07/2012   Procedure: PROCTOSCOPY;  Surgeon: Stark Klein, MD;  Location: [place];  Service: General;  Laterality: N/A;    There were no vitals filed for this visit.                    [facility] Adult PT Treatment/Exercise - 06/21/18 0001      Modalities   Modalities  Electrical Stimulation;Vasopneumatic      Electrical Stimulation   Electrical Stimulation Location  L ankle IFC x 1-10hz  x  15 mins    Electrical Stimulation Goals  Pain;Edema      Vasopneumatic   Number Minutes Vasopneumatic   15 minutes    Vasopnuematic Location   Ankle    Vasopneumatic Pressure  Low    Vasopneumatic Temperature   34      Ankle Exercises: Seated   Heel Raises  Left;20 reps    Toe Raise  20 reps    BAPS  Sitting;Level 2;Other (comment)    Other Seated Ankle Exercises  Rockerboard Df/Pf x3 min, EV/INV  x 3 mins,  inv/ Ev, and DF/PF 3x10 each    Other Seated Ankle Exercises  Dyandisc DF/Pf x3 min, inv/ev x3 min, circles x3 min               PT Short Term Goals - 05/28/18 1509      PT SHORT TERM GOAL #1   Title  Patient will be  independent with HEP.    Time  2    Period  Weeks    Status  Achieved      PT SHORT TERM GOAL #2   Title  Patient will demonstrate proper stair negotiation with one axillary crutch to safely enter and exit home    Time  2    Period  Weeks    Status  On-going      PT SHORT TERM GOAL #3   Title  Patient will ambulate with CAM boot and 1 axillary crutch to improve gait.    Time  2    Period  Weeks    Status  Achieved        PT Long Term Goals - 05/28/18 1509      PT LONG TERM GOAL #1   Title  Patient will demonstrate 4 degrees or greater of left DF AROM to improve gait mechanics    Time  4    Period  Weeks    Status  On-going      PT LONG TERM GOAL #2   Title  Patient will demonstrate 4/5 or greater left ankle MMT in all planes to improve stability during functional activities.    Time  4    Period  Weeks    Status  On-going      PT LONG TERM GOAL #3   Title  Patient will demonstrate a normalized gait pattern with no AD to improve gait mechanics.    Time  4    Status  On-going      PT LONG TERM GOAL #4   Title  Patient will demonstrate reciprocating stair negotiation with one railing to safely enter and exit home.    Time  4    Period  Weeks    Status  On-going      PT LONG TERM GOAL #5   Title  Patient will report ability to peform ADLs independently with pain less than or equal to 3/10 in left ankle.    Time  4    Period  Weeks    Status  On-going              Patient will benefit from skilled therapeutic intervention in order to improve the following deficits and impairments:     Visit Diagnosis: Pain in left ankle and joints of left foot  Difficulty in walking, not elsewhere classified  Muscle weakness (generalized)  Unsteadiness on feet     Problem List Patient Active Problem List   Diagnosis Date Noted  . Closed  fracture of left distal fibula 04/27/2018  . Sebaceous cyst   . Chronic low back pain 09/01/2017  . Migraine without aura  08/10/2017  . Sleep disorder 07/10/2017  . 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 Headache disorder 07/10/2017  . GERD (gastroesophageal reflux disease) 05/03/2017  . HNP (herniated nucleus pulposus), lumbar 05/14/2014  . Internal and external prolapsed hemorrhoids 03/15/2013  . Anxiety state, unspecified 03/15/2013  . Dysuria 07/23/2012  . Rectal cancer, cT2N0, 7 cm from anal verge 06/11/2012  . Hematochezia 05/25/2012  . Constipation 05/25/2012    ,CHRIS,PTA 06/21/2018, 10:55 PM  [facility] [address] [place], [place], [address] Phone: [phone]   Fax:  [phone]  Name: Cloe Sockwell MRN: [id number] Date of Birth: [birth year]

## 2018-06-25 ENCOUNTER — Other Ambulatory Visit: Payer: Self-pay | Admitting: *Deleted

## 2018-06-25 NOTE — Telephone Encounter (Signed)
Please call to schedule an appointment.  It is time for a follow up so you may continue with medication refills.

## 2018-06-25 NOTE — Telephone Encounter (Signed)
[place]. NTBS 30 days given 05/22/18

## 2018-06-26 ENCOUNTER — Ambulatory Visit: Payer: 59 | Admitting: *Deleted

## 2018-06-28 ENCOUNTER — Ambulatory Visit: Payer: No Typology Code available for payment source | Attending: Orthopedic Surgery | Admitting: *Deleted

## 2018-06-28 DIAGNOSIS — R2681 Unsteadiness on feet: Secondary | ICD-10-CM

## 2018-06-28 DIAGNOSIS — M6281 Muscle weakness (generalized): Secondary | ICD-10-CM | POA: Insufficient documentation

## 2018-06-28 DIAGNOSIS — R262 Difficulty in walking, not elsewhere classified: Secondary | ICD-10-CM | POA: Diagnosis present

## 2018-06-28 DIAGNOSIS — M25572 Pain in left ankle and joints of left foot: Secondary | ICD-10-CM | POA: Diagnosis present

## 2018-06-28 NOTE — Therapy (Signed)
[facility] [place], [place], [address] Phone: [phone]   Fax:  [phone]  Physical Therapy Treatment  Patient Details  Name: [name] MRN: [id number] Date of Birth: 06-Sep-1965 Referring Provider (PT): Victorino December, MD   Encounter Date: 06/28/2018  PT End of Session - 06/28/18 1640    Visit Number  10    Number of Visits  20    Date for PT Re-Evaluation  07/17/18    PT Start Time  9379    PT Stop Time  0240    PT Time Calculation (min)  46 min       Past Medical History:  Diagnosis Date  . Anxiety    NEW-DUE TO ANXIETY OVER DX OF CANCER  . Blood transfusion 1995  . Cancer (HCC)    RECTAL CANCER  . Closed fracture of left distal fibula 04/27/2018  . Constipation    SOME BLOOD IN STOOL  . GERD (gastroesophageal reflux disease)    occasionally-NO MEDS  . History of kidney stones   . Kidney stone   . PONV (postoperative nausea and vomiting)    used a scop patch last surgery-was better    Past Surgical History:  Procedure Laterality Date  . ABDOMINAL HYSTERECTOMY  1995   partial  . BACK SURGERY  2015  . COLON SURGERY    . COLONOSCOPY  06/01/2012   Procedure: COLONOSCOPY;  Surgeon: Danie Binder, MD;  [facility];  Service: Endoscopy;  Laterality: N/A;  1:30PM  . COLONOSCOPY N/A 06/14/2013   XBD:ZHGD diverticulosis/normal anastomosis  . endoscopic left fallopian tube removed  several yrs ago  . EUS  06/06/2012   Procedure: LOWER ENDOSCOPIC ULTRASOUND (EUS);  Surgeon: Arta Silence, MD;  Location: [facility];  Service: Endoscopy;  Laterality: N/A;  . EXAMINATION UNDER ANESTHESIA  09/07/2012   Procedure: EXAM UNDER ANESTHESIA;  Surgeon: Stark Klein, MD;  Location: [facility];  Service: General;  Laterality: N/A;  . EXCISION/RELEASE BURSA HIP  09/15/2011   Dr Tonita Cong EXCISION/RELEASE BURSA HIP;  Surgeon: Johnn Hai, MD;  [facility];  Service:  Orthopedics;  Laterality: Left;  Excision of Trochanteric Bursitis  . FLEXIBLE SIGMOIDOSCOPY N/A 03/22/2013   Procedure: FLEXIBLE SIGMOIDOSCOPY;  Surgeon: Danie Binder, MD;  [facility];  Service: Endoscopy;  Laterality: N/A;  2:00  . LAPAROSCOPIC LOW ANTERIOR RESECTION  07/02/2012   Procedure: LAPAROSCOPIC LOW ANTERIOR RESECTION;  Surgeon: Stark Klein, MD;  [facility];  Service: General;  Laterality: N/A;  . left ankle surgery for fx  several yrs ago  . LUMBAR LAMINECTOMY/DECOMPRESSION MICRODISCECTOMY N/A 05/14/2014   Procedure: LUMBAR DECOMPRESSION L2-L3;  Surgeon: Johnn Hai, MD;  [facility];  Service: Orthopedics;  Laterality: N/A;  . MASS EXCISION Left 02/02/2018   Procedure: EXCISION CYST, LEFT INNER THIGH;  Surgeon: Aviva Signs, MD;  [facility];  Service: General;  Laterality: Left;  . PROCTOSCOPY  09/07/2012   Procedure: PROCTOSCOPY;  Surgeon: Stark Klein, MD;  Location: [facility];  Service: General;  Laterality: N/A;    There were no vitals filed for this visit.                    [facility] Adult PT Treatment/Exercise - 06/28/18 0001      Exercises   Exercises  Knee/Hip      Knee/Hip Exercises: Aerobic   Nustep  L6 x 8 mins with UE/LE  and  4 mins LEs only      Modalities   Modalities  Electrical Stimulation;Vasopneumatic      Electrical Stimulation   Electrical Stimulation Location  L ankle IFC x 1-10hz  x 15 mins    Electrical Stimulation Goals  Pain;Edema      Vasopneumatic   Number Minutes Vasopneumatic   15 minutes    Vasopnuematic Location   Ankle    Vasopneumatic Pressure  Low    Vasopneumatic Temperature   34      Ankle Exercises: Seated   BAPS  Sitting;Level 2;Other (comment)   x 3 mins   Other Seated Ankle Exercises  Rockerboard Df/Pf x3 min, and DF/PF 3x10 each   Ankle isolator 1 #  x 6 mins all motions    Other Seated Ankle Exercises  Dyandisc DF/Pf x3 min, inv/ev x3 min, circles x3 min                PT Short Term Goals - 05/28/18 1509      PT SHORT TERM GOAL #1   Title  Patient will be independent with HEP.    Time  2    Period  Weeks    Status  Achieved      PT SHORT TERM GOAL #2   Title  Patient will demonstrate proper stair negotiation with one axillary crutch to safely enter and exit home    Time  2    Period  Weeks    Status  On-going      PT SHORT TERM GOAL #3   Title  Patient will ambulate with CAM boot and 1 axillary crutch to improve gait.    Time  2    Period  Weeks    Status  Achieved        PT Long Term Goals - 05/28/18 1509      PT LONG TERM GOAL #1   Title  Patient will demonstrate 4 degrees or greater of left DF AROM to improve gait mechanics    Time  4    Period  Weeks    Status  On-going      PT LONG TERM GOAL #2   Title  Patient will demonstrate 4/5 or greater left ankle MMT in all planes to improve stability during functional activities.    Time  4    Period  Weeks    Status  On-going      PT LONG TERM GOAL #3   Title  Patient will demonstrate a normalized gait pattern with no AD to improve gait mechanics.    Time  4    Status  On-going      PT LONG TERM GOAL #4   Title  Patient will demonstrate reciprocating stair negotiation with one railing to safely enter and exit home.    Time  4    Period  Weeks    Status  On-going      PT LONG TERM GOAL #5   Title  Patient will report ability to peform ADLs independently with pain less than or equal to 3/10 in left ankle.    Time  4    Period  Weeks    Status  On-going            Plan - 06/28/18 1643    Clinical Impression Statement  Pt arrived today doing fairly well with lower pain levels LT ankle. She has weaned from crutch and is just in CAM boot now. She did well with  progression of exs and ankle isolator today with 1# and did well.. Normal modality response today.    Clinical Presentation  Stable    Clinical Decision Making  Low    Rehab Potential  Good    PT  Frequency  3x / week    PT Duration  4 weeks    PT Treatment/Interventions  ADLs/Self Care Home Management;Gait training;Stair training;Neuromuscular re-education;Passive range of motion;Manual techniques;Functional mobility training;Therapeutic activities;Therapeutic exercise;Moist Heat;Cryotherapy;Electrical Stimulation;Balance training;Taping;Vasopneumatic Device;Patient/family education    PT Home Exercise Plan  see patient education section       Patient will benefit from skilled therapeutic intervention in order to improve the following deficits and impairments:  Pain, Decreased activity tolerance, Decreased range of motion, Decreased strength, Postural dysfunction, Decreased balance, Difficulty walking, Increased edema, Decreased endurance  Visit Diagnosis: Pain in left ankle and joints of left foot  Difficulty in walking, not elsewhere classified  Muscle weakness (generalized)  Unsteadiness on feet     Problem List Patient Active Problem List   Diagnosis Date Noted  . Closed fracture of left distal fibula 04/27/2018  . Sebaceous cyst   . Chronic low back pain 09/01/2017  . Migraine without aura 08/10/2017  . Sleep disorder 07/10/2017  . 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 Headache disorder 07/10/2017  . GERD (gastroesophageal reflux disease) 05/03/2017  . HNP (herniated nucleus pulposus), lumbar 05/14/2014  . Internal and external prolapsed hemorrhoids 03/15/2013  . Anxiety state, unspecified 03/15/2013  . Dysuria 07/23/2012  . Rectal cancer, cT2N0, 7 cm from anal verge 06/11/2012  . Hematochezia 05/25/2012  . Constipation 05/25/2012    RAMSEUR,CHRIS, PTA 06/28/2018, 6:04 PM  [facility] [place], [place], [address] Phone: [phone]   Fax:  [phone]  Name: [name] MRN: [id number] Date of Birth: 1965/08/27

## 2018-07-03 ENCOUNTER — Ambulatory Visit: Payer: 59 | Admitting: Physical Therapy

## 2018-07-03 ENCOUNTER — Encounter: Payer: Self-pay | Admitting: Physical Therapy

## 2018-07-03 DIAGNOSIS — R262 Difficulty in walking, not elsewhere classified: Secondary | ICD-10-CM

## 2018-07-03 DIAGNOSIS — M6281 Muscle weakness (generalized): Secondary | ICD-10-CM | POA: Diagnosis not present

## 2018-07-03 DIAGNOSIS — R2681 Unsteadiness on feet: Secondary | ICD-10-CM

## 2018-07-03 DIAGNOSIS — M25572 Pain in left ankle and joints of left foot: Secondary | ICD-10-CM

## 2018-07-03 NOTE — Therapy (Addendum)
[facility] [place], [place], [address] Phone: [phone]   Fax:  [phone]  Physical Therapy Treatment  Patient Details  Name: [name] MRN: [id number] Date of Birth: [birth year] Referring Provider (PT): Victorino December, MD   Encounter Date: 07/03/2018  PT End of Session - 07/03/18 1641    Visit Number  11    Number of Visits  20    Date for PT Re-Evaluation  07/17/18    PT Start Time  1640    PT Stop Time  1720    PT Time Calculation (min)  40 min    Activity Tolerance  Patient tolerated treatment well;Patient limited by pain    Behavior During Therapy  [facility] for tasks assessed/performed       Past Medical History:  Diagnosis Date  . Anxiety    NEW-DUE TO ANXIETY OVER DX OF CANCER  . Blood transfusion 1995  . Cancer (HCC)    RECTAL CANCER  . Closed fracture of left distal fibula 04/27/2018  . Constipation    SOME BLOOD IN STOOL  . GERD (gastroesophageal reflux disease)    occasionally-NO MEDS  . History of kidney stones   . Kidney stone   . PONV (postoperative nausea and vomiting)    used a scop patch last surgery-was better    Past Surgical History:  Procedure Laterality Date  . ABDOMINAL HYSTERECTOMY  1995   partial  . BACK SURGERY  2015  . COLON SURGERY    . COLONOSCOPY  06/01/2012   Procedure: COLONOSCOPY;  Surgeon: Danie Binder, MD;  [facility];  Service: Endoscopy;  Laterality: N/A;  1:30PM  . COLONOSCOPY N/A 06/14/2013   RKY:HCWC diverticulosis/normal anastomosis  . endoscopic left fallopian tube removed  several yrs ago  . EUS  06/06/2012   Procedure: LOWER ENDOSCOPIC ULTRASOUND (EUS);  Surgeon: Arta Silence, MD;  Location: [facility];  Service: Endoscopy;  Laterality: N/A;  . EXAMINATION UNDER ANESTHESIA  09/07/2012   Procedure: EXAM UNDER ANESTHESIA;  Surgeon: Stark Klein, MD;  Location: [place];  Service: General;  Laterality: N/A;  .  EXCISION/RELEASE BURSA HIP  09/15/2011   Dr Tonita Cong EXCISION/RELEASE BURSA HIP;  Surgeon: Johnn Hai, MD;  [facility];  Service: Orthopedics;  Laterality: Left;  Excision of Trochanteric Bursitis  . FLEXIBLE SIGMOIDOSCOPY N/A 03/22/2013   Procedure: FLEXIBLE SIGMOIDOSCOPY;  Surgeon: Danie Binder, MD;  [facility];  Service: Endoscopy;  Laterality: N/A;  2:00  . LAPAROSCOPIC LOW ANTERIOR RESECTION  07/02/2012   Procedure: LAPAROSCOPIC LOW ANTERIOR RESECTION;  Surgeon: Stark Klein, MD;  [facility];  Service: General;  Laterality: N/A;  . left ankle surgery for fx  several yrs ago  . LUMBAR LAMINECTOMY/DECOMPRESSION MICRODISCECTOMY N/A 05/14/2014   Procedure: LUMBAR DECOMPRESSION L2-L3;  Surgeon: Johnn Hai, MD;  [facility];  Service: Orthopedics;  Laterality: N/A;  . MASS EXCISION Left 02/02/2018   Procedure: EXCISION CYST, LEFT INNER THIGH;  Surgeon: Aviva Signs, MD;  Location: [facility];  Service: General;  Laterality: Left;  . PROCTOSCOPY  09/07/2012   Procedure: PROCTOSCOPY;  Surgeon: Stark Klein, MD;  Location: [place];  Service: General;  Laterality: N/A;    There were no vitals filed for this visit.  Subjective Assessment - 07/03/18 1737    Subjective  Patient reported feeling good but she reports pain in her back and hips from being uneven in the boot.  Pertinent History  Left distal fibula fracture 04/27/18    Limitations  House hold activities;Standing;Walking    Diagnostic tests  x-ray    Patient Stated Goals  decrease pain and return to normal    Currently in Pain?  Yes    Pain Score  2     Pain Location  Ankle    Pain Orientation  Left    Pain Descriptors / Indicators  Throbbing    Pain Type  Acute pain    Pain Onset  1 to 4 weeks ago    Pain Frequency  Intermittent         [facility] PT Assessment - 07/03/18 0001      Assessment   Medical Diagnosis  Closed fracture of left distal fibula    Referring Provider (PT)   Victorino December, MD    Onset Date/Surgical Date  04/27/18    Next MD Visit  December 2,2019    Prior Therapy  no      Precautions   Precaution Comments  No WB out of CAM boot      Restrictions   Other Position/Activity Restrictions  WBAT in CAM boot                   [facility] Adult PT Treatment/Exercise - 07/03/18 0001      Knee/Hip Exercises: Aerobic   Nustep  L6 x 5 mins LEs only      Modalities   Modalities  Electrical Stimulation;Vasopneumatic      Electrical Stimulation   Electrical Stimulation Location  L ankle IFC x 1-10hz  x 10 mins    Electrical Stimulation Goals  Pain;Edema      Vasopneumatic   Number Minutes Vasopneumatic   10 minutes    Vasopnuematic Location   Ankle    Vasopneumatic Pressure  Low    Vasopneumatic Temperature   34      Manual Therapy   Manual Therapy  Edema management    Edema Management  edema massage to left ankle distal to proximal strokes with light to moderate pressure      Ankle Exercises: Seated   Other Seated Ankle Exercises  Dyandisc DF/Pf x3 min, inv/ev x3 min, circles x3 min             PT Education - 07/03/18 1804    Education Details  edema massage    Person(s) Educated  Patient    Methods  Explanation;Demonstration;Handout    Comprehension  Verbalized understanding       PT Short Term Goals - 05/28/18 1509      PT SHORT TERM GOAL #1   Title  Patient will be independent with HEP.    Time  2    Period  Weeks    Status  Achieved      PT SHORT TERM GOAL #2   Title  Patient will demonstrate proper stair negotiation with one axillary crutch to safely enter and exit home    Time  2    Period  Weeks    Status  On-going      PT SHORT TERM GOAL #3   Title  Patient will ambulate with CAM boot and 1 axillary crutch to improve gait.    Time  2    Period  Weeks    Status  Achieved        PT Long Term Goals - 05/28/18 1509      PT LONG TERM GOAL #1   Title  Patient will  demonstrate 4 degrees or greater of  left DF AROM to improve gait mechanics    Time  4    Period  Weeks    Status  On-going      PT LONG TERM GOAL #2   Title  Patient will demonstrate 4/5 or greater left ankle MMT in all planes to improve stability during functional activities.    Time  4    Period  Weeks    Status  On-going      PT LONG TERM GOAL #3   Title  Patient will demonstrate a normalized gait pattern with no AD to improve gait mechanics.    Time  4    Status  On-going      PT LONG TERM GOAL #4   Title  Patient will demonstrate reciprocating stair negotiation with one railing to safely enter and exit home.    Time  4    Period  Weeks    Status  On-going      PT LONG TERM GOAL #5   Title  Patient will report ability to peform ADLs independently with pain less than or equal to 3/10 in left ankle.    Time  4    Period  Weeks    Status  On-going            Plan - 07/03/18 1738    Clinical Impression Statement  Patient requested shortened treatment today. Patient was able to tolerate TEs well with ongoing discomfort with eversion on dynadisc. Patient did well with edema massage and patient noted with less swelling after session. Normal response to modalities at end of session.    Clinical Presentation  Stable    Clinical Decision Making  Low    Rehab Potential  Good    PT Frequency  3x / week    PT Duration  4 weeks    PT Treatment/Interventions  ADLs/Self Care Home Management;Gait training;Stair training;Neuromuscular re-education;Passive range of motion;Manual techniques;Functional mobility training;Therapeutic activities;Therapeutic exercise;Moist Heat;Cryotherapy;Electrical Stimulation;Balance training;Taping;Vasopneumatic Device;Patient/family education    PT Next Visit Plan  Continue POC with respect to precautions and protocol, modalities PRN for pain relief and edema control    PT Home Exercise Plan  see patient education section    Consulted and Agree with Plan of Care  Patient       Patient  will benefit from skilled therapeutic intervention in order to improve the following deficits and impairments:  Pain, Decreased activity tolerance, Decreased range of motion, Decreased strength, Postural dysfunction, Decreased balance, Difficulty walking, Increased edema, Decreased endurance  Visit Diagnosis: Pain in left ankle and joints of left foot  Difficulty in walking, not elsewhere classified  Muscle weakness (generalized)  Unsteadiness on feet     Problem List Patient Active Problem List   Diagnosis Date Noted  . Closed fracture of left distal fibula 04/27/2018  . Sebaceous cyst   . Chronic low back pain 09/01/2017  . Migraine without aura 08/10/2017  . Sleep disorder 07/10/2017  . 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 Headache disorder 07/10/2017  . GERD (gastroesophageal reflux disease) 05/03/2017  . HNP (herniated nucleus pulposus), lumbar 05/14/2014  . Internal and external prolapsed hemorrhoids 03/15/2013  . Anxiety state, unspecified 03/15/2013  . Dysuria 07/23/2012  . Rectal cancer, cT2N0, 7 cm from anal verge 06/11/2012  . Hematochezia 05/25/2012  . Constipation 05/25/2012   Gabriela Eves, PT, DPT 07/03/2018, 6:05 PM  [facility] [place],  [place], [address] Phone: [phone]   Fax:  [phone]  Name: [name] MRN: [id number] Date of Birth: 1966/06/15

## 2018-07-05 ENCOUNTER — Encounter: Payer: Self-pay | Admitting: Physical Therapy

## 2018-07-10 ENCOUNTER — Ambulatory Visit: Payer: 59 | Admitting: *Deleted

## 2018-07-10 DIAGNOSIS — M25572 Pain in left ankle and joints of left foot: Secondary | ICD-10-CM

## 2018-07-10 DIAGNOSIS — M6281 Muscle weakness (generalized): Secondary | ICD-10-CM

## 2018-07-10 DIAGNOSIS — R262 Difficulty in walking, not elsewhere classified: Secondary | ICD-10-CM

## 2018-07-10 DIAGNOSIS — R2681 Unsteadiness on feet: Secondary | ICD-10-CM

## 2018-07-10 NOTE — Therapy (Addendum)
[facility] [place], [place], [address] Phone: [phone]   Fax:  [phone]  Physical Therapy Treatment/Discharge  PHYSICAL THERAPY DISCHARGE SUMMARY  Visits from Start of Care: 12  Current functional level related to goals / functional outcomes: See below   Remaining deficits: See goals   Education / Equipment: HEP  Plan: Patient agrees to discharge.  Patient goals were not met. Patient is being discharged due to the physician's request.  ?????  Per patient report.  Gabriela Eves, PT, DPT 11/13/18      Patient Details  Name: [name] MRN: [id number] Date of Birth: [birth year] Referring Provider (PT): Victorino December, MD   Encounter Date: 07/10/2018  PT End of Session - 07/10/18 1749    Visit Number  12    Number of Visits  20    Date for PT Re-Evaluation  07/17/18    PT Start Time  2111    PT Stop Time  5520    PT Time Calculation (min)  52 min       Past Medical History:  Diagnosis Date  . Anxiety    NEW-DUE TO ANXIETY OVER DX OF CANCER  . Blood transfusion 1995  . Cancer (HCC)    RECTAL CANCER  . Closed fracture of left distal fibula 04/27/2018  . Constipation    SOME BLOOD IN STOOL  . GERD (gastroesophageal reflux disease)    occasionally-NO MEDS  . History of kidney stones   . Kidney stone   . PONV (postoperative nausea and vomiting)    used a scop patch last surgery-was better    Past Surgical History:  Procedure Laterality Date  . ABDOMINAL HYSTERECTOMY  1995   partial  . BACK SURGERY  2015  . COLON SURGERY    . COLONOSCOPY  06/01/2012   Procedure: COLONOSCOPY;  Surgeon: Danie Binder, MD;  [facility];  Service: Endoscopy;  Laterality: N/A;  1:30PM  . COLONOSCOPY N/A 06/14/2013   EYE:MVVK diverticulosis/normal anastomosis  . endoscopic left fallopian tube removed  several yrs ago  . EUS  06/06/2012   Procedure: LOWER ENDOSCOPIC ULTRASOUND (EUS);   Surgeon: Arta Silence, MD;  Location: [facility];  Service: Endoscopy;  Laterality: N/A;  . EXAMINATION UNDER ANESTHESIA  09/07/2012   Procedure: EXAM UNDER ANESTHESIA;  Surgeon: Stark Klein, MD;  Location: [place];  Service: General;  Laterality: N/A;  . EXCISION/RELEASE BURSA HIP  09/15/2011   Dr Tonita Cong EXCISION/RELEASE BURSA HIP;  Surgeon: Johnn Hai, MD;  [facility];  Service: Orthopedics;  Laterality: Left;  Excision of Trochanteric Bursitis  . FLEXIBLE SIGMOIDOSCOPY N/A 03/22/2013   Procedure: FLEXIBLE SIGMOIDOSCOPY;  Surgeon: Danie Binder, MD;  [facility];  Service: Endoscopy;  Laterality: N/A;  2:00  . LAPAROSCOPIC LOW ANTERIOR RESECTION  07/02/2012   Procedure: LAPAROSCOPIC LOW ANTERIOR RESECTION;  Surgeon: Stark Klein, MD;  [facility];  Service: General;  Laterality: N/A;  . left ankle surgery for fx  several yrs ago  . LUMBAR LAMINECTOMY/DECOMPRESSION MICRODISCECTOMY N/A 05/14/2014   Procedure: LUMBAR DECOMPRESSION L2-L3;  Surgeon: Johnn Hai, MD;  [facility];  Service: Orthopedics;  Laterality: N/A;  . MASS EXCISION Left 02/02/2018   Procedure: EXCISION CYST, LEFT INNER THIGH;  Surgeon: Aviva Signs, MD;  [facility];  Service: General;  Laterality: Left;  . PROCTOSCOPY  09/07/2012   Procedure: PROCTOSCOPY;  Surgeon: Stark Klein, MD;  Location: [place];  Service: General;  Laterality: N/A;    There were no vitals filed for this visit.  Subjective Assessment - 07/10/18 1640    Subjective  Did good after last Rx. LT foot 2-3/10    Pertinent History  Left distal fibula fracture 04/27/18    Limitations  House hold activities;Standing;Walking    Diagnostic tests  x-ray    Patient Stated Goals  decrease pain and return to normal    Currently in Pain?  Yes    Pain Score  2     Pain Location  Ankle    Pain Orientation  Left    Pain Descriptors / Indicators  Throbbing    Pain Type  Acute pain    Pain  Onset  1 to 4 weeks ago    Pain Frequency  Intermittent         [facility] PT Assessment - 07/10/18 0001      AROM   Right/Left Ankle  Left    Left Ankle Dorsiflexion  16    Left Ankle Plantar Flexion  48    Left Ankle Inversion  35    Left Ankle Eversion  20                   [facility] Adult PT Treatment/Exercise - 07/10/18 0001      Exercises   Exercises  Knee/Hip      Knee/Hip Exercises: Aerobic   Nustep  L4 x 12 mins LEs only      Modalities   Modalities  Electrical Stimulation;Vasopneumatic      Electrical Stimulation   Electrical Stimulation Location  L ankle IFC x 1-_0  x 10 mins    Electrical Stimulation Goals  Pain;Edema      Vasopneumatic   Number Minutes Vasopneumatic   15 minutes    Vasopnuematic Location   Ankle    Vasopneumatic Pressure  Low    Vasopneumatic Temperature   34      Manual Therapy   Manual Therapy  Edema management    Edema Management  edema massage to left ankle distal to proximal strokes with light to moderate pressure      Ankle Exercises: Seated   Other Seated Ankle Exercises  Rockerboard Df/Pf x3 min, and DF/PF 3x10 each       Other Seated Ankle Exercises  Dyandisc DF/Pf x3 min, inv/ev x3 min, circles x3 min               PT Short Term Goals - 05/28/18 1509      PT SHORT TERM GOAL #1   Title  Patient will be independent with HEP.    Time  2    Period  Weeks    Status  Achieved      PT SHORT TERM GOAL #2   Title  Patient will demonstrate proper stair negotiation with one axillary crutch to safely enter and exit home    Time  2    Period  Weeks    Status  On-going      PT SHORT TERM GOAL #3   Title  Patient will ambulate with CAM boot and 1 axillary crutch to improve gait.    Time  2    Period  Weeks    Status  Achieved        PT Long Term Goals - 05/28/18 1509      PT LONG TERM GOAL #1   Title  Patient will demonstrate 4 degrees or greater of left DF  AROM to improve gait mechanics    Time  4     Period  Weeks    Status  On-going      PT LONG TERM GOAL #2   Title  Patient will demonstrate 4/5 or greater left ankle MMT in all planes to improve stability during functional activities.    Time  4    Period  Weeks    Status  On-going      PT LONG TERM GOAL #3   Title  Patient will demonstrate a normalized gait pattern with no AD to improve gait mechanics.    Time  4    Status  On-going      PT LONG TERM GOAL #4   Title  Patient will demonstrate reciprocating stair negotiation with one railing to safely enter and exit home.    Time  4    Period  Weeks    Status  On-going      PT LONG TERM GOAL #5   Title  Patient will report ability to peform ADLs independently with pain less than or equal to 3/10 in left ankle.    Time  4    Period  Weeks    Status  On-going            Plan - 07/10/18 1752    Clinical Impression Statement  Pt arrived today doing fairly well and reports minimal pain and is ready to DC the boot. She did well with all exs and ROM is within functional limits. Min swelling now and normal modality responses.To MD next week.    Clinical Presentation  Stable    Clinical Decision Making  Low    Rehab Potential  Good    PT Frequency  3x / week    PT Duration  4 weeks    PT Treatment/Interventions  ADLs/Self Care Home Management;Gait training;Stair training;Neuromuscular re-education;Passive range of motion;Manual techniques;Functional mobility training;Therapeutic activities;Therapeutic exercise;Moist Heat;Cryotherapy;Electrical Stimulation;Balance training;Taping;Vasopneumatic Device;Patient/family education    PT Next Visit Plan  Continue POC with respect to precautions and protocol, modalities PRN for pain relief and edema control    PT Home Exercise Plan  see patient education section    Consulted and Agree with Plan of Care  Patient       Patient will benefit from skilled therapeutic intervention in order to improve the following deficits and impairments:   Pain, Decreased activity tolerance, Decreased range of motion, Decreased strength, Postural dysfunction, Decreased balance, Difficulty walking, Increased edema, Decreased endurance  Visit Diagnosis: Pain in left ankle and joints of left foot  Difficulty in walking, not elsewhere classified  Muscle weakness (generalized)  Unsteadiness on feet     Problem List Patient Active Problem List   Diagnosis Date Noted  . Closed fracture of left distal fibula 04/27/2018  . Sebaceous cyst   . Chronic low back pain 09/01/2017  . Migraine without aura 08/10/2017  . Sleep disorder 07/10/2017  . 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 Headache disorder 07/10/2017  . GERD (gastroesophageal reflux disease) 05/03/2017  . HNP (herniated nucleus pulposus), lumbar 05/14/2014  . Internal and external prolapsed hemorrhoids 03/15/2013  . Anxiety state, unspecified 03/15/2013  . Dysuria 07/23/2012  . Rectal cancer, cT2N0, 7 cm from anal verge 06/11/2012  . Hematochezia 05/25/2012  . Constipation 05/25/2012    Svara Twyman,CHRIS, PTA 07/10/2018, 6:02 PM  [facility] [address] [place], [place], [address] Phone: [phone]   Fax:  [phone]  Name:  [name] MRN: [id number] Date of Birth: May 05, 1966

## 2018-07-17 ENCOUNTER — Encounter: Payer: Self-pay | Admitting: *Deleted

## 2018-07-19 ENCOUNTER — Ambulatory Visit: Payer: 59 | Attending: Orthopedic Surgery | Admitting: *Deleted

## 2018-07-23 ENCOUNTER — Ambulatory Visit (INDEPENDENT_AMBULATORY_CARE_PROVIDER_SITE_OTHER): Payer: 59

## 2018-07-23 ENCOUNTER — Encounter: Payer: Self-pay | Admitting: Orthopedic Surgery

## 2018-07-23 ENCOUNTER — Ambulatory Visit: Payer: 59 | Admitting: Orthopedic Surgery

## 2018-07-23 VITALS — BP 143/87 | HR 91 | Ht 66.0 in | Wt 212.0 lb

## 2018-07-23 DIAGNOSIS — M25511 Pain in right shoulder: Secondary | ICD-10-CM

## 2018-07-23 DIAGNOSIS — G8929 Other chronic pain: Secondary | ICD-10-CM | POA: Diagnosis not present

## 2018-07-23 DIAGNOSIS — M7551 Bursitis of right shoulder: Secondary | ICD-10-CM

## 2018-07-23 DIAGNOSIS — Z85038 Personal history of other malignant neoplasm of large intestine: Secondary | ICD-10-CM | POA: Diagnosis not present

## 2018-07-23 DIAGNOSIS — R232 Flushing: Secondary | ICD-10-CM | POA: Diagnosis not present

## 2018-07-23 DIAGNOSIS — Z01419 Encounter for gynecological examination (general) (routine) without abnormal findings: Secondary | ICD-10-CM | POA: Diagnosis not present

## 2018-07-23 NOTE — Patient Instructions (Signed)
Bursitis Bursitis is inflammation and irritation of a bursa, which is one of the small, fluid-filled sacs that cushion and protect the moving parts of your body. These sacs are located between bones and muscles, muscle attachments, or skin areas next to bones. A bursa protects these structures from the wear and tear that results from frequent movement. An inflamed bursa causes pain and swelling. Fluid may build up inside the sac. Bursitis is most common near joints, especially the knees, elbows, hips, and shoulders. What are the causes? Bursitis can be caused by:  Injury from: ? A direct blow, like falling on your knee or elbow. ? Overuse of a joint (repetitive stress).  Infection. This can happen if bacteria gets into a bursa through a cut or scrape near a joint.  Diseases that cause joint inflammation, such as gout and rheumatoid arthritis.  What increases the risk? You may be at risk for bursitis if you:  Have a job or hobby that involves a lot of repetitive stress on your joints.  Have a condition that weakens your body's defense system (immune system), such as diabetes, cancer, or HIV.  Lift and reach overhead often.  Kneel or lean on hard surfaces often.  Run or walk often.  What are the signs or symptoms? The most common signs and symptoms of bursitis are:  Pain that gets worse when you move the affected body part or put weight on it.  Inflammation.  Stiffness.  Other signs and symptoms may include:  Redness.  Tenderness.  Warmth.  Pain that continues after rest.  Fever and chills. This may occur in bursitis caused by infection.  How is this diagnosed? Bursitis may be diagnosed by:  Medical history and physical exam.  MRI.  A procedure to drain fluid from the bursa with a needle (aspiration). The fluid may be checked for signs of infection or gout.  Blood tests to rule out other causes of inflammation.  How is this treated? Bursitis can usually be  treated at home with rest, ice, compression, and elevation (RICE). For mild bursitis, RICE treatment may be all you need. Other treatments may include:  Nonsteroidal anti-inflammatory drugs (NSAIDs) to treat pain and inflammation.  Corticosteroids to fight inflammation. You may have these drugs injected into and around the area of bursitis.  Aspiration of bursitis fluid to relieve pain and improve movement.  Antibiotic medicine to treat an infected bursa.  A splint, brace, or walking aid.  Physical therapy if you continue to have pain or limited movement.  Surgery to remove a damaged or infected bursa. This may be needed if you have a very bad case of bursitis or if other treatments have not worked.  Follow these instructions at home:  Take medicines only as directed by your health care provider.  If you were prescribed an antibiotic medicine, finish it all even if you start to feel better.  Rest the affected area as directed by your health care provider. ? Keep the area elevated. ? Avoid activities that make pain worse.  Apply ice to the injured area: ? Place ice in a plastic bag. ? Place a towel between your skin and the bag. ? Leave the ice on for 20 minutes, 2-3 times a day.  Use splints, braces, pads, or walking aids as directed by your health care provider.  Keep all follow-up visits as directed by your health care provider. This is important. How is this prevented?  Wear knee pads if you kneel often.    Wear sturdy running or walking shoes that fit you well.  Take regular breaks from repetitive activity.  Warm up by stretching before doing any strenuous activity.  Maintain a healthy weight or lose weight as recommended by your health care provider. Ask your health care provider if you need help.  Exercise regularly. Start any new physical activity gradually. Contact a health care provider if:  Your bursitis is not responding to treatment or home care.  You have  a fever.  You have chills. This information is not intended to replace advice given to you by your health care provider. Make sure you discuss any questions you have with your health care provider. Document Released: 07/29/2000 Document Revised: 01/07/2016 Document Reviewed: 10/21/2013 Elsevier Interactive Patient Education  2018 [employer]  

## 2018-07-23 NOTE — Progress Notes (Signed)
ROUTINE  OFFICE VISIT  Chief Complaint  Patient presents with  . Shoulder Pain    right      Pain right shoulder 3 to 4 months Dull ache Moderate Painful range of motion decreased range of motion  52 year old female last seen in 2015 for painful right shoulder responded well to subacromial injection.  She reports no trauma to the right shoulder just a dull ache which is progressively worsened over the last 3 to 4 months.  She notes pain when she lifts her arm over her head and overall notices a decrease in global range of motion   Review of Systems  Musculoskeletal: Negative for neck pain.  Neurological: Negative for tingling.     Past Medical History:  Diagnosis Date  . Anxiety    NEW-DUE TO ANXIETY OVER DX OF CANCER  . Blood transfusion 1995  . Cancer (HCC)    RECTAL CANCER  . Closed fracture of left distal fibula 04/27/2018  . Constipation    SOME BLOOD IN STOOL  . GERD (gastroesophageal reflux disease)    occasionally-NO MEDS  . History of kidney stones   . Kidney stone   . PONV (postoperative nausea and vomiting)    used a scop patch last surgery-was better    Past Surgical History:  Procedure Laterality Date  . ABDOMINAL HYSTERECTOMY  1995   partial  . BACK SURGERY  2015  . COLON SURGERY    . COLONOSCOPY  06/01/2012   Procedure: COLONOSCOPY;  Surgeon: Danie Binder, MD;  Location: [facility];  Service: Endoscopy;  Laterality: N/A;  1:30PM  . COLONOSCOPY N/A 06/14/2013   FAO:ZHYQ diverticulosis/normal anastomosis  . endoscopic left fallopian tube removed  several yrs ago  . EUS  06/06/2012   Procedure: LOWER ENDOSCOPIC ULTRASOUND (EUS);  Surgeon: Arta Silence, MD;  Location: [facility];  Service: Endoscopy;  Laterality: N/A;  . EXAMINATION UNDER ANESTHESIA  09/07/2012   Procedure: EXAM UNDER ANESTHESIA;  Surgeon: Stark Klein, MD;  Location: [facility];  Service: General;  Laterality: N/A;  . EXCISION/RELEASE BURSA HIP  09/15/2011    Dr Tonita Cong EXCISION/RELEASE BURSA HIP;  Surgeon: Johnn Hai, MD;  Location: [facility];  Service: Orthopedics;  Laterality: Left;  Excision of Trochanteric Bursitis  . FLEXIBLE SIGMOIDOSCOPY N/A 03/22/2013   Procedure: FLEXIBLE SIGMOIDOSCOPY;  Surgeon: Danie Binder, MD;  Location: [facility];  Service: Endoscopy;  Laterality: N/A;  2:00  . LAPAROSCOPIC LOW ANTERIOR RESECTION  07/02/2012   Procedure: LAPAROSCOPIC LOW ANTERIOR RESECTION;  Surgeon: Stark Klein, MD;  Location: [facility];  Service: General;  Laterality: N/A;  . left ankle surgery for fx  several yrs ago  . LUMBAR LAMINECTOMY/DECOMPRESSION MICRODISCECTOMY N/A 05/14/2014   Procedure: LUMBAR DECOMPRESSION L2-L3;  Surgeon: Johnn Hai, MD;  Location: [facility];  Service: Orthopedics;  Laterality: N/A;  . MASS EXCISION Left 02/02/2018   Procedure: EXCISION CYST, LEFT INNER THIGH;  Surgeon: Aviva Signs, MD;  Location: [facility];  Service: General;  Laterality: Left;  . PROCTOSCOPY  09/07/2012   Procedure: PROCTOSCOPY;  Surgeon: Stark Klein, MD;  Location: [facility];  Service: General;  Laterality: N/A;    Family History  Problem Relation Age of Onset  . Other Sister   . Migraines Mother   . Colon cancer Paternal Aunt    Social History   Tobacco Use  . Smoking status: Never Smoker  . Smokeless tobacco: Never Used  Substance Use Topics  . Alcohol use:  Yes    Comment: occasional couple times per mo  . Drug use: No    Allergies  Allergen Reactions  . Codeine Nausea Only    Confirmed intolerance of codeine verbally with pt in PACU, pt states does not cause any SOB or dyspnea, some nausea only (MN-RN)  . Ultram [Tramadol] Nausea And Vomiting    Ultram allergy also discussed, active N&V occurs with Ultram  . Latex Itching and Rash    Current Meds  Medication Sig  . esomeprazole (NEXIUM) 40 MG capsule TAKE 1 CAPSULE BY MOUTH EVERY DAY  . estradiol (ESTRACE) 1 MG tablet TAKE 1 TABLET TWICE A DAY  .  linaclotide (LINZESS) 290 MCG CAPS capsule Take 1 capsule (290 mcg total) by mouth daily. (Needs to be seen before next refill)    BP (!) 143/87   Pulse 91   Ht 5\' 6"  (1.676 m)   Wt 212 lb (96.2 kg)   BMI 34.22 kg/m   Physical Exam  Constitutional: She is oriented to person, place, and time. She appears well-developed and well-nourished.  Neurological: She is alert and oriented to person, place, and time.  Psychiatric: She has a normal mood and affect. Judgment normal.  Vitals reviewed.   Right Shoulder Exam   Tenderness  Right shoulder tenderness location: POSTERIOR SUBACROMIAL SPACE   Range of Motion  Active abduction: normal  Passive abduction: normal  Extension: normal  External rotation: normal  Forward flexion:  120 abnormal  Internal rotation 0 degrees: abnormal   Muscle Strength  The patient has normal right shoulder strength.  Tests  Apprehension: negative Cross arm: negative Impingement: positive Drop arm: negative Sulcus: absent  Other  Erythema: absent Scars: absent Sensation: normal Pulse: present   Left Shoulder Exam   Tenderness  The patient is experiencing no tenderness.   Range of Motion  The patient has normal left shoulder ROM.  Muscle Strength  The patient has normal left shoulder strength.  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  . Chronic right shoulder pain   . Bursitis of right shoulder Yes    PLAN: (Rx., injectx, surgery, frx, mri/ct)  Procedure note the subacromial injection shoulder RIGHT  Verbal consent was obtained to inject the  RIGHT   Shoulder  Timeout was completed to  confirm the injection site is a subacromial space of the  RIGHT  shoulder   Medication used Depo-Medrol 40 mg and lidocaine 1% 3 cc  Anesthesia was provided by ethyl chloride  The injection was performed in the RIGHT  posterior subacromial space. After pinning the skin with alcohol and anesthetized the skin with ethyl chloride the subacromial space was injected using a 20-gauge needle. There were no complications  Sterile dressing was applied.    No orders of the defined types were placed in this encounter.   Arther Abbott, MD  07/23/2018 4:40 PM

## 2018-07-24 ENCOUNTER — Ambulatory Visit: Payer: No Typology Code available for payment source | Attending: Orthopedic Surgery | Admitting: *Deleted

## 2018-07-24 ENCOUNTER — Ambulatory Visit: Payer: 59 | Admitting: *Deleted

## 2018-07-26 ENCOUNTER — Encounter: Payer: Self-pay | Admitting: *Deleted

## 2018-07-27 DIAGNOSIS — Z1231 Encounter for screening mammogram for malignant neoplasm of breast: Secondary | ICD-10-CM | POA: Diagnosis not present

## 2018-07-31 ENCOUNTER — Ambulatory Visit: Payer: 59 | Admitting: Family Medicine

## 2018-07-31 ENCOUNTER — Encounter: Payer: Self-pay | Admitting: Family Medicine

## 2018-07-31 VITALS — BP 123/77 | HR 85 | Temp 98.7°F | Ht 66.0 in | Wt 216.0 lb

## 2018-07-31 DIAGNOSIS — L918 Other hypertrophic disorders of the skin: Secondary | ICD-10-CM | POA: Diagnosis not present

## 2018-07-31 NOTE — Patient Instructions (Signed)
Cryosurgery for Skin Conditions, Care After This sheet gives you information about how to care for yourself after your procedure. Your health care provider may also give you more specific instructions. If you have problems or questions, contact your health care provider. What can I expect after the procedure? After your procedure, it is common to have redness, swelling, and a blister that forms over the treated area. The blister may contain a small amount of blood. After about 2 weeks, the blister will break on its own, leaving a scab. Then the treated area will heal. After healing, there is usually little or no scarring. Follow these instructions at home: Caring for the treated area  Follow instructions from your health care provider about how to take care of the treated area. Make sure you: ? Keep the area covered with a bandage (dressing) until it heals, or for as long as told by your health care provider. ? Wash your hands with soap and water before you change your dressing. If soap and water are not available, use hand sanitizer. ? Change your dressing as told by your health care provider. ? Keep the dressing and the treated area clean and dry. If the dressing gets wet, change it right away. ? Clean the treated area with soap and water.  Check the treated area every day for signs of infection. Check for: ? More redness, swelling, or pain. ? More fluid or blood. ? Warmth. ? Pus or a bad smell. General instructions  Do not pick at your blister or try to break it open. This can cause infection and scarring.  Do not apply any medicine, cream, or lotion to the treated area unless directed by your health care provider.  Take over-the-counter and prescription medicines only as told by your health care provider.  Keep all follow-up visits as told by your health care provider. This is important. Contact a health care provider if:  You have more redness, swelling, or pain around the treated  area.  You have more fluid or blood coming from the treated area.  The treated area feels warm to the touch.  You have pus or a bad smell coming from the treated area.  Your blister becomes large and painful. Get help right away if:  You have a fever and have redness spreading from the treated area. Summary  The treated area will become red and swollen shortly after the procedure.  You should keep the treated area and your dressing clean and dry.  Check the treated area every day for signs of infection, such as fluid, pus, warmth, or having more redness, swelling, or pain.  Do not pick at your blister or try to break it open. This information is not intended to replace advice given to you by your health care provider. Make sure you discuss any questions you have with your health care provider. Document Released: 02/18/2005 Document Revised: 06/20/2016 Document Reviewed: 06/20/2016 Elsevier Interactive Patient Education  2017 [employer].

## 2018-07-31 NOTE — Progress Notes (Signed)
Subjective: CC: irritated mole PCP: Janora Norlander, DO AYT:KZSW [name] is a 52 y.o. female presenting to clinic today for:  1. Irritated mole Patient reports that she started having irritation of a mole that is on her left underarm near the bra line today.  She notes that it is inflamed and somewhat painful.  She denies any bleeding or exudate.  She is wondering if this is something that can be treated today.   ROS: Per HPI  Allergies  Allergen Reactions  . Codeine Nausea Only    Confirmed intolerance of codeine verbally with pt in PACU, pt states does not cause any SOB or dyspnea, some nausea only (MN-RN)  . Ultram [Tramadol] Nausea And Vomiting    Ultram allergy also discussed, active N&V occurs with Ultram  . Latex Itching and Rash   Past Medical History:  Diagnosis Date  . Anxiety    NEW-DUE TO ANXIETY OVER DX OF CANCER  . Blood transfusion 1995  . Cancer (HCC)    RECTAL CANCER  . Closed fracture of left distal fibula 04/27/2018  . Constipation    SOME BLOOD IN STOOL  . GERD (gastroesophageal reflux disease)    occasionally-NO MEDS  . History of kidney stones   . Kidney stone   . PONV (postoperative nausea and vomiting)    used a scop patch last surgery-was better    Current Outpatient Medications:  .  esomeprazole (NEXIUM) 40 MG capsule, TAKE 1 CAPSULE BY MOUTH EVERY DAY, Disp: 90 capsule, Rfl: 1 .  estradiol (ESTRACE) 1 MG tablet, TAKE 1 TABLET TWICE A DAY, Disp: 60 tablet, Rfl: 4 .  linaclotide (LINZESS) 290 MCG CAPS capsule, Take 1 capsule (290 mcg total) by mouth daily. (Needs to be seen before next refill), Disp: 30 capsule, Rfl: 0 .  cyclobenzaprine (FLEXERIL) 10 MG tablet, Take 10 mg by mouth 3 (three) times daily as needed for spasms., Disp: , Rfl: 0 .  oxyCODONE-acetaminophen (PERCOCET) 10-325 MG tablet, Take 1 tablet by mouth every 6 (six) hours as needed for pain., Disp: , Rfl:  No current facility-administered medications for this visit.    Facility-Administered Medications Ordered in Other Visits:  .  gadopentetate dimeglumine (MAGNEVIST) injection 20 mL, 20 mL, Intravenous, Once PRN, Melvenia Beam, MD Social History   Socioeconomic History  . Marital status: Single    Spouse name: Not on file  . Number of children: 2  . Years of education: Not on file  . Highest education level: Not on file  Occupational History  . Occupation: Hotel manager    Employer: [employer]  Social Needs  . Financial resource strain: Not on file  . Food insecurity:    Worry: Not on file    Inability: Not on file  . Transportation needs:    Medical: Not on file    Non-medical: Not on file  Tobacco Use  . Smoking status: Never Smoker  . Smokeless tobacco: Never Used  Substance and Sexual Activity  . Alcohol use: Yes    Comment: occasional couple times per mo  . Drug use: No  . Sexual activity: Yes    Birth control/protection: Surgical  Lifestyle  . Physical activity:    Days per week: Not on file    Minutes per session: Not on file  . Stress: Not on file  Relationships  . Social connections:    Talks on phone: Not on file    Gets together: Not on file  Attends religious service: Not on file    Active member of club or organization: Not on file    Attends meetings of clubs or organizations: Not on file    Relationship status: Not on file  . Intimate partner violence:    Fear of current or ex partner: Not on file    Emotionally abused: Not on file    Physically abused: Not on file    Forced sexual activity: Not on file  Other Topics Concern  . Not on file  Social History Narrative   Lives alone & 2 kids         Family History  Problem Relation Age of Onset  . Other Sister   . Migraines Mother   . Colon cancer Paternal Aunt     Objective: Office vital signs reviewed. BP 123/77   Pulse 85   Temp 98.7 F (37.1 C) (Oral)   Ht 5\' 6"  (1.676 m)   Wt 216 lb (98 kg)   BMI 34.86 kg/m   Physical  Examination:  General: Awake, alert, well nourished, No acute distress Skin: Approximately 2.5 mm inflamed skin tag noted posterior left axilla.  No induration, exudate or bleeding.  Cryotherapy Procedure:  Risks and benefits of procedure were reviewed with the patient.  Written consent obtained and scanned into the chart.  Lesion of concern was identified and located on left posterior axilla.  Liquid nitrogen was applied to area of concern and extending out 1 millimeters beyond the border of the lesion.  Treated area was allowed to come back to room temperature before treating it a second time.  Patient tolerated procedure well and there were no immediate complications.  Home care instructions were reviewed with the patient and a handout was provided.  Assessment/ Plan: 52 y.o. female   1. Inflamed skin tag Treated with cryotherapy today.  Patient tolerated procedure without difficulty.  Home care instructions reviewed.  If symptoms do not resolve, I have asked her to call the office for an appointment.  We can consider excision of the lesion.  She voiced good understanding will follow-up PRN.   No orders of the defined types were placed in this encounter.  No orders of the defined types were placed in this encounter.    Janora Norlander, DO [place] [phone]

## 2018-08-01 ENCOUNTER — Ambulatory Visit: Payer: 59 | Admitting: Family Medicine

## 2018-08-03 DIAGNOSIS — M47816 Spondylosis without myelopathy or radiculopathy, lumbar region: Secondary | ICD-10-CM | POA: Diagnosis not present

## 2018-08-06 ENCOUNTER — Telehealth: Payer: Self-pay | Admitting: Family Medicine

## 2018-08-06 ENCOUNTER — Other Ambulatory Visit: Payer: Self-pay | Admitting: Physician Assistant

## 2018-08-06 MED ORDER — VALACYCLOVIR HCL 1 G PO TABS: 1000.0000 mg | ORAL_TABLET | Freq: Two times a day (BID) | ORAL | 0 refills | Status: DC

## 2018-08-06 NOTE — Telephone Encounter (Signed)
Pt aware.

## 2018-08-06 NOTE — Telephone Encounter (Signed)
PT was seen last week, and now she has a fever blister coming up wants to know if we can send in a rx for her valtrex, without her having to come back in to be seen.  Pharmacy: [facility]

## 2018-08-06 NOTE — Telephone Encounter (Signed)
Sent valtrex

## 2018-08-06 NOTE — Telephone Encounter (Signed)
Valtrex not on her med list? But she says she always takes it with fever blisters but that Dr Darnell Level has done it in the past. Can we send it in?

## 2018-09-27 ENCOUNTER — Encounter: Payer: Self-pay | Admitting: Family Medicine

## 2018-09-27 ENCOUNTER — Ambulatory Visit: Payer: 59 | Admitting: Family Medicine

## 2018-09-27 VITALS — BP 124/75 | HR 74 | Temp 98.6°F | Ht 66.0 in | Wt 214.0 lb

## 2018-09-27 DIAGNOSIS — J020 Streptococcal pharyngitis: Secondary | ICD-10-CM | POA: Diagnosis not present

## 2018-09-27 DIAGNOSIS — J029 Acute pharyngitis, unspecified: Secondary | ICD-10-CM | POA: Diagnosis not present

## 2018-09-27 DIAGNOSIS — Z8619 Personal history of other infectious and parasitic diseases: Secondary | ICD-10-CM | POA: Diagnosis not present

## 2018-09-27 LAB — RAPID STREP SCREEN (MED CTR MEBANE ONLY): STREP GP A AG, IA W/REFLEX: POSITIVE — AB

## 2018-09-27 MED ORDER — FLUCONAZOLE 150 MG PO TABS: 150.0000 mg | ORAL_TABLET | Freq: Once | ORAL | 0 refills | Status: AC

## 2018-09-27 MED ORDER — CEFDINIR 300 MG PO CAPS: 300.0000 mg | ORAL_CAPSULE | Freq: Two times a day (BID) | ORAL | 0 refills | Status: DC

## 2018-09-27 NOTE — Patient Instructions (Addendum)

## 2018-09-27 NOTE — Progress Notes (Signed)
    Subjective:     [name] is a 53 y.o. female who presents for evaluation of sore throat. Associated symptoms include low grade fevers, dry cough, bilateral ear fullness, headache, sinus and nasal congestion and sore throat. Onset of symptoms was 7 days ago, and have been gradually worsening since that time. She is drinking plenty of fluids. She has had a recent close exposure to someone with proven streptococcal pharyngitis.  The following portions of the patient's history were reviewed and updated as appropriate: allergies, current medications, past family history, past medical history, past social history, past surgical history and problem list.  Review of Systems Constitutional: positive for chills, fatigue and fevers Eyes: negative Ears, nose, mouth, throat, and face: positive for earaches, nasal congestion and sore throat Respiratory: positive for cough Cardiovascular: negative Gastrointestinal: negative Musculoskeletal:positive for myalgias Neurological: positive for headaches    Objective:    BP 124/75   Pulse 74   Temp 98.6 F (37 C) (Oral)   Ht 5\' 6"  (1.676 m)   Wt 214 lb (97.1 kg)   BMI 34.54 kg/m  General appearance: alert, cooperative and mild distress Head: Normocephalic, without obvious abnormality, atraumatic Eyes: negative Ears: abnormal TM right ear - serous middle ear fluid and abnormal TM left ear - serous middle ear fluid Nose: clear discharge, mild congestion, no sinus tenderness Throat: abnormal findings: exudates present, marked oropharyngeal erythema and tonsillar hypertrophy 3+ Neck: mild anterior cervical adenopathy, no carotid bruit, no JVD, supple, symmetrical, trachea midline and thyroid not enlarged, symmetric, no tenderness/mass/nodules Lungs: clear to auscultation bilaterally Heart: regular rate and rhythm, S1, S2 normal, no murmur, click, rub or gallop Skin: Skin color, texture, turgor normal. No rashes or lesions Neurologic:  Grossly normal  Laboratory Strep test done. Results:positive.    Assessment:     [name] was seen today for chest and sinus congestion, sore throat, cough, bilateral ea.  Diagnoses and all orders for this visit:  Sore throat -     Rapid Strep Screen ([facility] ONLY)  Strep pharyngitis Symptomatic care discussed. Medications as prescribed. Report any new or worsening symptoms.  -     cefdinir (OMNICEF) 300 MG capsule; Take 1 capsule (300 mg total) by mouth 2 (two) times daily. 1 po BID  History of candidal vulvovaginitis -     fluconazole (DIFLUCAN) 150 MG tablet; Take 1 tablet (150 mg total) by mouth once for 1 dose.     Plan:    Patient placed on antibiotics. Use of OTC analgesics recommended as well as salt water gargles. Patient advised of the risk of peritonsillar abscess formation. Patient advised that he will be infectious for 24 hours after starting antibiotics. Follow up as needed.    The above assessment and management plan was discussed with the patient. The patient verbalized understanding of and has agreed to the management plan. Patient is aware to call the clinic if symptoms fail to improve or worsen. Patient is aware when to return to the clinic for a follow-up visit. Patient educated on when it is appropriate to go to the emergency department.   Monia Pouch, FNP-C [facility] [address] [place], [place] [address] [phone]

## 2018-10-16 ENCOUNTER — Encounter: Payer: Self-pay | Admitting: Family Medicine

## 2018-10-16 ENCOUNTER — Ambulatory Visit: Payer: BLUE CROSS/BLUE SHIELD | Admitting: Family Medicine

## 2018-10-16 VITALS — BP 117/71 | HR 70 | Temp 97.8°F | Ht 66.0 in | Wt 214.0 lb

## 2018-10-16 DIAGNOSIS — J020 Streptococcal pharyngitis: Secondary | ICD-10-CM

## 2018-10-16 LAB — RAPID STREP SCREEN (MED CTR MEBANE ONLY): Strep Gp A Ag, IA W/Reflex: POSITIVE — AB

## 2018-10-16 MED ORDER — FLUCONAZOLE 150 MG PO TABS: 150.0000 mg | ORAL_TABLET | Freq: Once | ORAL | 0 refills | Status: AC

## 2018-10-16 MED ORDER — AMOXICILLIN-POT CLAVULANATE 875-125 MG PO TABS: 1.0000 | ORAL_TABLET | Freq: Two times a day (BID) | ORAL | 0 refills | Status: DC

## 2018-10-16 NOTE — Progress Notes (Signed)
Chief Complaint  Patient presents with  . Sore Throat, sinus drainage    was treated for strep throat two weeks ago    HPI  Patient presents today for Patient presents with upper respiratory congestion. There is moderate sore throat. Patient reports coughing moderately. There is no fever, chills or sweats. The patient denies being short of breath. Onset was 3days ago. Gradually worsening.   PMH: Smoking status noted ROS: Per HPI  Objective: BP 117/71   Pulse 70   Temp 97.8 F (36.6 C) (Oral)   Ht 5\' 6"  (1.676 m)   Wt 214 lb (97.1 kg)   BMI 34.54 kg/m  Gen: NAD, alert, cooperative with exam HEENT: NCAT, Nasal passages boggy pharynx red. CV: RRR, good S1/S2, no murmur Resp: Bronchitis changes with scattered wheezes, non-labored Ext: No edema, warm Neuro: Alert and oriented, No gross deficits  Assessment and plan:  1. Strep pharyngitis     Meds ordered this encounter  Medications  . amoxicillin-clavulanate (AUGMENTIN) 875-125 MG tablet    Sig: Take 1 tablet by mouth 2 (two) times daily. Take all of this medication    Dispense:  20 tablet    Refill:  0  . fluconazole (DIFLUCAN) 150 MG tablet    Sig: Take 1 tablet (150 mg total) by mouth once for 1 dose. At onset of symptoms. Repeat at end of treatment    Dispense:  2 tablet    Refill:  0    Orders Placed This Encounter  Procedures  . Rapid Strep Screen ([facility] ONLY)    Follow up as needed.  Claretta Fraise, MD

## 2018-11-29 ENCOUNTER — Other Ambulatory Visit: Payer: Self-pay | Admitting: Family Medicine

## 2018-12-10 NOTE — Progress Notes (Signed)
REVIEWED-NO ADDITIONAL RECOMMENDATIONS. 

## 2018-12-12 ENCOUNTER — Emergency Department (HOSPITAL_COMMUNITY)
Admission: EM | Admit: 2018-12-12 | Discharge: 2018-12-12 | Disposition: A | Discharge status: Home / Self Care | Payer: BLUE CROSS/BLUE SHIELD | Attending: Emergency Medicine | Admitting: Emergency Medicine

## 2018-12-12 ENCOUNTER — Emergency Department (HOSPITAL_COMMUNITY): Payer: BLUE CROSS/BLUE SHIELD

## 2018-12-12 ENCOUNTER — Other Ambulatory Visit: Payer: Self-pay

## 2018-12-12 ENCOUNTER — Encounter (HOSPITAL_COMMUNITY): Payer: Self-pay | Admitting: Emergency Medicine

## 2018-12-12 DIAGNOSIS — R079 Chest pain, unspecified: Secondary | ICD-10-CM | POA: Diagnosis present

## 2018-12-12 DIAGNOSIS — I471 Supraventricular tachycardia: Secondary | ICD-10-CM | POA: Diagnosis not present

## 2018-12-12 DIAGNOSIS — F419 Anxiety disorder, unspecified: Secondary | ICD-10-CM | POA: Insufficient documentation

## 2018-12-12 LAB — CBC
HCT: 43.8 % (ref 36.0–46.0)
Hemoglobin: 13.8 g/dL (ref 12.0–15.0)
MCH: 29.6 pg (ref 26.0–34.0)
MCHC: 31.5 g/dL (ref 30.0–36.0)
MCV: 94 fL (ref 80.0–100.0)
Platelets: 245 10*3/uL (ref 150–400)
RBC: 4.66 MIL/uL (ref 3.87–5.11)
RDW: 12.8 % (ref 11.5–15.5)
WBC: 8.1 10*3/uL (ref 4.0–10.5)
nRBC: 0 % (ref 0.0–0.2)

## 2018-12-12 LAB — BASIC METABOLIC PANEL
Anion gap: 9 (ref 5–15)
BUN: 15 mg/dL (ref 6–20)
CO2: 25 mmol/L (ref 22–32)
Calcium: 9 mg/dL (ref 8.9–10.3)
Chloride: 107 mmol/L (ref 98–111)
Creatinine, Ser: 0.78 mg/dL (ref 0.44–1.00)
GFR calc Af Amer: >60 mL/min (ref >60)
GFR calc non Af Amer: >60 mL/min (ref >60)
Glucose, Bld: 71 mg/dL (ref 70–99)
Potassium: 3.5 mmol/L (ref 3.5–5.1)
Sodium: 141 mmol/L (ref 135–145)

## 2018-12-12 LAB — TROPONIN I
Troponin I: 0.1 ng/mL (ref <0.03)
Troponin I: 0.17 ng/mL (ref <0.03)

## 2018-12-12 LAB — MAGNESIUM: Magnesium: 2.1 mg/dL (ref 1.7–2.4)

## 2018-12-12 LAB — I-STAT TROPONIN, ED: Troponin i, poc: 0.14 ng/mL (ref 0.00–0.08)

## 2018-12-12 LAB — TSH: TSH: 3.496 u[IU]/mL (ref 0.350–4.500)

## 2018-12-12 LAB — D-DIMER, QUANTITATIVE: D-Dimer, Quant: 0.28 ug/mL-FEU (ref 0.00–0.50)

## 2018-12-12 MED ORDER — ASPIRIN 81 MG PO CHEW
324.0000 mg | CHEWABLE_TABLET | Freq: Once | ORAL | Status: AC
  Administered 2018-12-12: 324 mg via ORAL
  Filled 2018-12-12: qty 4

## 2018-12-12 MED ORDER — IBUPROFEN 400 MG PO TABS: 600.0000 mg | ORAL_TABLET | Freq: Once | ORAL | Status: DC

## 2018-12-12 MED ORDER — FENTANYL CITRATE (PF) 100 MCG/2ML IJ SOLN
50.0000 ug | Freq: Once | INTRAMUSCULAR | Status: DC
  Filled 2018-12-12: qty 2

## 2018-12-12 MED ORDER — ETOMIDATE 2 MG/ML IV SOLN
15.0000 mg | Freq: Once | INTRAVENOUS | Status: DC
  Filled 2018-12-12: qty 10

## 2018-12-12 MED ORDER — METOPROLOL SUCCINATE ER 25 MG PO TB24: 25.0000 mg | ORAL_TABLET | Freq: Every day | ORAL | 0 refills | Status: DC

## 2018-12-12 MED ORDER — METOPROLOL SUCCINATE ER 25 MG PO TB24
25.0000 mg | ORAL_TABLET | Freq: Once | ORAL | Status: AC
  Administered 2018-12-12: 17:00:00 25 mg via ORAL
  Filled 2018-12-12: qty 1

## 2018-12-12 NOTE — ED Triage Notes (Signed)
Pt states having central chest pain and dry cough since 0600 am this morning. No fever

## 2018-12-12 NOTE — ED Notes (Signed)
Date and time results received: 12/12/18 1:04 PM  (use smartphrase ".now" to insert current time)  Test: Troponin Critical Value: 0.10  Name of Provider Notified: [facility]  Orders Received? Or Actions Taken?: Orders Received - See Orders for details

## 2018-12-12 NOTE — ED Provider Notes (Signed)
[facility] EMERGENCY DEPARTMENT Provider Note   CSN: [id number] Arrival date & time: 12/12/18  1127    History   Chief Complaint Chief Complaint  Patient presents with  . Chest Pain  . Cough    HPI [name] is a 53 y.o. female.     HPI   53 year old female with chest pain, palpitations or cough.  Onset when she woke up this morning.  Persistent since then.  She has a sensation that her heart is racing.  Dry cough.  Sharp chest pain when she coughs.  She feels mildly short of breath.  No fevers or chills.  No unusual leg pain or swelling.  Denies any past history of any cardiac issues that she is aware of.  Past Medical History:  Diagnosis Date  . Anxiety    NEW-DUE TO ANXIETY OVER DX OF CANCER  . Blood transfusion 1995  . Cancer (HCC)    RECTAL CANCER  . Closed fracture of left distal fibula 04/27/2018  . Constipation    SOME BLOOD IN STOOL  . GERD (gastroesophageal reflux disease)    occasionally-NO MEDS  . History of kidney stones   . Kidney stone   . PONV (postoperative nausea and vomiting)    used a scop patch last surgery-was better    Patient Active Problem List   Diagnosis Date Noted  . Closed fracture of left distal fibula 04/27/2018  . Sebaceous cyst   . Chronic low back pain 09/01/2017  . Migraine without aura 08/10/2017  . Sleep disorder 07/10/2017  . 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 Headache disorder 07/10/2017  . GERD (gastroesophageal reflux disease) 05/03/2017  . HNP (herniated nucleus pulposus), lumbar 05/14/2014  . Internal and external prolapsed hemorrhoids 03/15/2013  . Anxiety state, unspecified 03/15/2013  . Dysuria 07/23/2012  . Rectal cancer, cT2N0, 7 cm from anal verge 06/11/2012  . Hematochezia 05/25/2012  . Constipation 05/25/2012     Past Surgical History:  Procedure Laterality Date  . ABDOMINAL HYSTERECTOMY  1995   partial  . BACK SURGERY  2015  . COLON SURGERY    . COLONOSCOPY  06/01/2012   Procedure: COLONOSCOPY;  Surgeon: Danie Binder, MD;  [facility];  Service: Endoscopy;  Laterality: N/A;  1:30PM  . COLONOSCOPY N/A 06/14/2013   FTD:DUKG diverticulosis/normal anastomosis  . endoscopic left fallopian tube removed  several yrs ago  . EUS  06/06/2012   Procedure: LOWER ENDOSCOPIC ULTRASOUND (EUS);  Surgeon: Arta Silence, MD;  Location: [facility];  Service: Endoscopy;  Laterality: N/A;  . EXAMINATION UNDER ANESTHESIA  09/07/2012   Procedure: EXAM UNDER ANESTHESIA;  Surgeon: Stark Klein, MD;  Location: [place];  Service: General;  Laterality: N/A;  . EXCISION/RELEASE BURSA HIP  09/15/2011   Dr Tonita Cong EXCISION/RELEASE BURSA HIP;  Surgeon: Johnn Hai, MD;  Location: [facility];  Service: Orthopedics;  Laterality: Left;  Excision of Trochanteric Bursitis  . FLEXIBLE SIGMOIDOSCOPY N/A 03/22/2013   Procedure: FLEXIBLE SIGMOIDOSCOPY;  Surgeon: Danie Binder, MD;  [facility]: [facility];  Service: Endoscopy;  Laterality: N/A;  2:00  . LAPAROSCOPIC LOW ANTERIOR RESECTION  07/02/2012   Procedure: LAPAROSCOPIC LOW ANTERIOR RESECTION;  Surgeon: Stark Klein, MD;  Location: [facility];  Service: General;  Laterality: N/A;  . left ankle surgery for fx  several yrs ago  . LUMBAR LAMINECTOMY/DECOMPRESSION MICRODISCECTOMY N/A 05/14/2014   Procedure: LUMBAR DECOMPRESSION L2-L3;  Surgeon: Johnn Hai, MD;  Location: [facility];  Service: Orthopedics;  Laterality: N/A;  . MASS EXCISION Left 02/02/2018   Procedure: EXCISION CYST, LEFT INNER THIGH;  Surgeon: Aviva Signs, MD;  Location: [facility];  Service: General;  Laterality: Left;  . PROCTOSCOPY  09/07/2012   Procedure: PROCTOSCOPY;  Surgeon: Stark Klein, MD;  Location: [place];  Service: General;  Laterality: N/A;     OB History   No obstetric history on file.      Home Medications    Prior to Admission medications   Medication Sig Start Date End Date Taking? Authorizing Provider   amoxicillin-clavulanate (AUGMENTIN) 875-125 MG tablet Take 1 tablet by mouth 2 (two) times daily. Take all of this medication 10/16/18   Claretta Fraise, MD  cefdinir (OMNICEF) 300 MG capsule Take 1 capsule (300 mg total) by mouth 2 (two) times daily. 1 po BID 09/27/18   Baruch Gouty, FNP  cyclobenzaprine (FLEXERIL) 10 MG tablet Take 10 mg by mouth 3 (three) times daily as needed for spasms. 12/28/17   [provider]  esomeprazole (NEXIUM) 40 MG capsule Take 1 capsule (40 mg total) by mouth daily. (Needs to be seen before next refill) 11/30/18   Janora Norlander, DO  estradiol (ESTRACE) 1 MG tablet TAKE 1 TABLET TWICE A DAY 12/28/15   Hassell Done, Mary-Margaret, FNP  linaclotide [facility]) 290 MCG CAPS capsule Take 1 capsule (290 mcg total) by mouth daily. (Needs to be seen before next refill) 05/22/18   Janora Norlander, DO  oxyCODONE-acetaminophen (PERCOCET) 10-325 MG tablet Take 1 tablet by mouth every 6 (six) hours as needed for pain.    [provider]  valACYclovir (VALTREX) 1000 MG tablet Take 1 tablet (1,000 mg total) by mouth 2 (two) times daily. 08/06/18   Terald Sleeper, PA-C    Family History Family History  Problem Relation Age of Onset  . Other Sister   . Migraines Mother   . Colon cancer Paternal Aunt     Social History Social History   Tobacco Use  . Smoking status: Never Smoker  . Smokeless tobacco: Never Used  Substance Use Topics  . Alcohol use: Yes    Comment: occasional couple times per mo  . Drug use: No     Allergies   Codeine; Ultram [tramadol]; and Latex   Review of Systems Review of Systems All systems reviewed and negative, other than as noted in HPI.   Physical Exam Updated Vital Signs Ht 5\' 6"  (1.676 m)   Wt 97.1 kg   BMI 34.54 kg/m   Physical Exam Vitals signs and nursing note reviewed.  Constitutional:      General: She is not in acute distress.    Appearance: She is well-developed.  HENT:     Head: Normocephalic and  atraumatic.  Eyes:     General:        Right eye: No discharge.        Left eye: No discharge.     Conjunctiva/sclera: Conjunctivae normal.  Neck:     Musculoskeletal: Neck supple.  Cardiovascular:     Rate and Rhythm: Normal rate and regular rhythm.     Heart sounds: Normal heart sounds. No murmur. No friction rub. No gallop.   Pulmonary:     Effort: Pulmonary effort is normal. No respiratory distress.     Breath sounds: Normal breath sounds.  Abdominal:     General: There is no distension.     Palpations: Abdomen is soft.     Tenderness: There  is no abdominal tenderness.  Musculoskeletal:        General: No tenderness.  Skin:    General: Skin is warm and dry.  Neurological:     Mental Status: She is alert.  Psychiatric:        Behavior: Behavior normal.        Thought Content: Thought content normal.    ED Treatments / Results  Labs (all labs ordered are listed, but only abnormal results are displayed) Labs Reviewed  TROPONIN I - Abnormal; Notable for the following components:      Result Value   Troponin I 0.10 (*)    All other components within normal limits  TROPONIN I - Abnormal; Notable for the following components:   Troponin I 0.17 (*)    All other components within normal limits  I-STAT TROPONIN, ED - Abnormal; Notable for the following components:   Troponin i, poc 0.14 (*)    All other components within normal limits  BASIC METABOLIC PANEL  CBC  MAGNESIUM  TSH  D-DIMER, QUANTITATIVE (NOT AT [facility])    EKG EKG Interpretation  Date/Time:  Wednesday December 12 2018 13:39:23 EDT Ventricular Rate:  86 PR Interval:    QRS Duration: 92 QT Interval:  347 QTC Calculation: 415 R Axis:   67 Text Interpretation:  Sinus rhythm Confirmed by Virgel Manifold [phone]) on 12/12/2018 2:55:14 PM   Radiology Dg Chest Port 1 View  Result Date: 12/12/2018 CLINICAL DATA:  Chest pain. EXAM: PORTABLE CHEST 1 VIEW COMPARISON:  Radiographs of April 01, 2018. FINDINGS: The  heart size and mediastinal contours are within normal limits. Both lungs are clear. No pneumothorax or pleural effusion is noted. The visualized skeletal structures are unremarkable. IMPRESSION: No active disease. Electronically Signed   By: Marijo Conception M.D.   On: 12/12/2018 12:54    Procedures Procedures (including critical care time)  Medications Ordered in ED Medications - No data to display   Initial Impression / Assessment and Plan / ED Course  I have reviewed the triage vital signs and the nursing notes.  Pertinent labs & imaging results that were available during my care of the patient were reviewed by me and considered in my medical decision making (see chart for details).  53yF with atypical CP and palpitations. SVT versus atypical atrial flutter. Spontaneously converted. Feeling better. Repeat EKG w/o concerning findings. Electrolytes and TSH fine. Initial troponin is mildly elevated. Will repeat. Plan for DC with outpt cardiology FU if it doesn't significant rise. DC with metoprolol. Discussed with cardiology. If rises significantly then would admit for observation to hospitalist service for continued trending.   Final Clinical Impressions(s) / ED Diagnoses   Final diagnoses:  SVT (supraventricular tachycardia) [facility])    ED Discharge Orders    None       Virgel Manifold, MD 12/13/18 1216

## 2018-12-12 NOTE — ED Provider Notes (Signed)
Patient still feeling fine.  Heart rates normal sinus on cardiac monitoring.  Patient's follow-up troponin at the 3-hour mark went from 0.1 to 0.17.  This is not a significant increase.  And with patient being asymptomatic feel most likely secondary to just heart strain.  Patient has follow-up with cardiology.  Patient started on beta-blocker.  Patient will return for any new or worse symptoms.      Fredia Sorrow, MD 12/12/18 [phone]

## 2018-12-13 ENCOUNTER — Telehealth: Payer: Self-pay | Admitting: Cardiovascular Disease

## 2018-12-13 NOTE — Telephone Encounter (Signed)
Virtual Visit Pre-Appointment Phone Call  "(Name), I am calling you today to discuss your upcoming appointment. We are currently trying to limit exposure to the virus that causes COVID-19 by seeing patients at home rather than in the office."  "What is the BEST phone number to call the day of the visit?" -[phone]  1. Do you have or have access to (through a family member/friend) a smartphone with video capability that we can use for your visit?" a. If yes - list this number in appt notes as cell (if different from BEST phone #) and list the appointment type as a VIDEO visit in appointment notes b. If no - list the appointment type as a PHONE visit in appointment notes  2. Confirm consent - "In the setting of the current Covid19 crisis, you are scheduled for a (phone or video) visit with your provider on (date) at (time).  Just as we do with many in-office visits, in order for you to participate in this visit, we must obtain consent.  If you'd like, I can send this to your mychart (if signed up) or email for you to review.  Otherwise, I can obtain your verbal consent now.  All virtual visits are billed to your insurance company just like a normal visit would be.  By agreeing to a virtual visit, we'd like you to understand that the technology does not allow for your provider to perform an examination, and thus may limit your provider's ability to fully assess your condition. If your provider identifies any concerns that need to be evaluated in person, we will make arrangements to do so.  Finally, though the technology is pretty good, we cannot assure that it will always work on either your or our end, and in the setting of a video visit, we may have to convert it to a phone-only visit.  In either situation, we cannot ensure that we have a secure connection.  Are you willing to proceed?" STAFF: Did the patient verbally acknowledge consent to telehealth visit? Document YES/NO here: YES    3. Advise patient to be prepared - "Two hours prior to your appointment, go ahead and check your blood pressure, pulse, oxygen saturation, and your weight (if you have the equipment to check those) and write them all down. When your visit starts, your provider will ask you for this information. If you have an Apple Watch or Kardia device, please plan to have heart rate information ready on the day of your appointment. Please have a pen and paper handy nearby the day of the visit as well."  4. Give patient instructions for MyChart download to smartphone OR Doximity/Doxy.me as below if video visit (depending on what platform provider is using)  5. Inform patient they will receive a phone call 15 minutes prior to their appointment time (may be from unknown caller ID) so they should be prepared to answer    TELEPHONE CALL NOTE  [name] has been deemed a candidate for a follow-up tele-health visit to limit community exposure during the Covid-19 pandemic. I spoke with the patient via phone to ensure availability of phone/video source, confirm preferred email & phone number, and discuss instructions and expectations.  I reminded [name] to be prepared with any vital sign and/or heart rhythm information that could potentially be obtained via home monitoring, at the time of her visit. I reminded [name] to expect a phone call prior to her visit.  Vicky T  Slaughter 12/13/2018 9:52 AM   INSTRUCTIONS FOR DOWNLOADING THE MYCHART APP TO SMARTPHONE  - The patient must first make sure to have activated MyChart and know their login information - If Apple, go to [employer] and type in MyChart in the search bar and download the app. If Android, ask patient to go to [employer] and type in [place] in the search bar and download the app. The app is free but as with any other app downloads, their phone may require them to verify saved payment information or  Apple/Android password.  - The patient will need to then log into the app with their MyChart username and password, and select [facility] as their healthcare provider to link the account. When it is time for your visit, go to the MyChart app, find appointments, and click Begin Video Visit. Be sure to Select Allow for your device to access the Microphone and Camera for your visit. You will then be connected, and your provider will be with you shortly.  **If they have any issues connecting, or need assistance please contact MyChart service desk [phone])**  **If using a computer, in order to ensure the best quality for their visit they will need to use either of the following Internet Browsers: [employer], or Google Chrome**  IF USING DOXIMITY or DOXY.ME - The patient will receive a link just prior to their visit by text.     FULL LENGTH CONSENT FOR TELE-HEALTH VISIT   I hereby voluntarily request, consent and authorize [facility] and its employed or contracted physicians, physician assistants, nurse practitioners or other licensed health care professionals (the Practitioner), to provide me with telemedicine health care services (the Services") as deemed necessary by the treating Practitioner. I acknowledge and consent to receive the Services by the Practitioner via telemedicine. I understand that the telemedicine visit will involve communicating with the Practitioner through live audiovisual communication technology and the disclosure of certain medical information by electronic transmission. I acknowledge that I have been given the opportunity to request an in-person assessment or other available alternative prior to the telemedicine visit and am voluntarily participating in the telemedicine visit.  I understand that I have the right to withhold or withdraw my consent to the use of telemedicine in the course of my care at any time, without affecting my right to future care  or treatment, and that the Practitioner or I may terminate the telemedicine visit at any time. I understand that I have the right to inspect all information obtained and/or recorded in the course of the telemedicine visit and may receive copies of available information for a reasonable fee.  I understand that some of the potential risks of receiving the Services via telemedicine include:   Delay or interruption in medical evaluation due to technological equipment failure or disruption;  Information transmitted may not be sufficient (e.g. poor resolution of images) to allow for appropriate medical decision making by the Practitioner; and/or   In rare instances, security protocols could fail, causing a breach of personal health information.  Furthermore, I acknowledge that it is my responsibility to provide information about my medical history, conditions and care that is complete and accurate to the best of my ability. I acknowledge that Practitioner's advice, recommendations, and/or decision may be based on factors not within their control, such as incomplete or inaccurate data provided by me or distortions of diagnostic images or specimens that may result from electronic transmissions. I understand that the practice of  medicine is not an Chief Strategy Officer and that Practitioner makes no warranties or guarantees regarding treatment outcomes. I acknowledge that I will receive a copy of this consent concurrently upon execution via email to the email address I last provided but may also request a printed copy by calling the office of [facility].    I understand that my insurance will be billed for this visit.   I have read or had this consent read to me.  I understand the contents of this consent, which adequately explains the benefits and risks of the Services being provided via telemedicine.   I have been provided ample opportunity to ask questions regarding this consent and the Services and have had  my questions answered to my satisfaction.  I give my informed consent for the services to be provided through the use of telemedicine in my medical care  By participating in this telemedicine visit I agree to the above.

## 2018-12-17 ENCOUNTER — Telehealth (INDEPENDENT_AMBULATORY_CARE_PROVIDER_SITE_OTHER): Payer: BLUE CROSS/BLUE SHIELD | Admitting: Cardiovascular Disease

## 2018-12-17 ENCOUNTER — Encounter: Payer: Self-pay | Admitting: Cardiovascular Disease

## 2018-12-17 VITALS — BP 130/80 | HR 93 | Ht 66.0 in | Wt 212.0 lb

## 2018-12-17 DIAGNOSIS — I471 Supraventricular tachycardia: Secondary | ICD-10-CM

## 2018-12-17 NOTE — Progress Notes (Signed)
Virtual Visit via Video Note   This visit type was conducted due to national recommendations for restrictions regarding the COVID-19 Pandemic (e.g. social distancing) in an effort to limit this patient's exposure and mitigate transmission in our community.  Due to her co-morbid illnesses, this patient is at least at moderate risk for complications without adequate follow up.  This format is felt to be most appropriate for this patient at this time.  All issues noted in this document were discussed and addressed.  A limited physical exam was performed with this format.  Please refer to the patient's chart for her consent to telehealth for [facility].   Date:  12/17/2018   ID:  [name], DOB 09/20/1965, MRN [id number]  Patient Location: Home Provider Location: Office  PCP:  Janora Norlander, DO  Cardiologist:  No primary care provider on file.  Electrophysiologist:  None   Evaluation Performed:  New Patient Evaluation  Chief Complaint: Palpitations  History of Present Illness:    [name] is a 53 y.o. female with chest pain and palpitations.  She was evaluated in the ED on 12/12/2018.  I personally reviewed all relevant documentation, labs, and studies.  It appears she had some palpitations and some mild shortness of breath.  Troponins were 0.1 and 0.17.  TSH, d-dimer, magnesium, CBC, basic metabolic panel were all normal.  Chest x-ray showed no active disease.  ECG showed SVT which spontaneously converted to sinus rhythm.  She was started on a beta-blocker.  Upon speaking with her further, it appears symptoms developed while she was driving to work.  She has never had symptoms like this before.  She had some associated mild chest pains and mild shortness of breath.  She has had mild chest pains before which she attributes to anxiety and stress.  She said the symptoms were different.  She said her heart would not stop racing.  Ever since starting  metoprolol succinate, she has been doing well and denies chest pain, shortness of breath, palpitations, and dizziness.  She feels mildly fatigued and takes it at night.  She drinks about 2 bottles of Coca-Cola daily.  She drinks about 2-3 beers per week and never more than 1 in a row.  The patient does not have symptoms concerning for COVID-19 infection (fever, chills, cough, or new shortness of breath).   Family history: No family history of arrhythmias or premature coronary artery disease.   Past Medical History:  Diagnosis Date  . Anxiety    NEW-DUE TO ANXIETY OVER DX OF CANCER  . Blood transfusion 1995  . Cancer (HCC)    RECTAL CANCER  . Closed fracture of left distal fibula 04/27/2018  . Constipation    SOME BLOOD IN STOOL  . GERD (gastroesophageal reflux disease)    occasionally-NO MEDS  . History of kidney stones   . Kidney stone   . PONV (postoperative nausea and vomiting)    used a scop patch last surgery-was better   Past Surgical History:  Procedure Laterality Date  . ABDOMINAL HYSTERECTOMY  1995   partial  . BACK SURGERY  2015  . COLON SURGERY    . COLONOSCOPY  06/01/2012   Procedure: COLONOSCOPY;  Surgeon: Danie Binder, MD;  [facility];  Service: Endoscopy;  Laterality: N/A;  1:30PM  . COLONOSCOPY N/A 06/14/2013   [id number] diverticulosis/normal anastomosis  . endoscopic left fallopian tube removed  several yrs ago  . EUS  06/06/2012   Procedure:  LOWER ENDOSCOPIC ULTRASOUND (EUS);  Surgeon: Arta Silence, MD;  Location: [facility];  Service: Endoscopy;  Laterality: N/A;  . EXAMINATION UNDER ANESTHESIA  09/07/2012   Procedure: EXAM UNDER ANESTHESIA;  Surgeon: Stark Klein, MD;  Location: [place];  Service: General;  Laterality: N/A;  . EXCISION/RELEASE BURSA HIP  09/15/2011   Dr Tonita Cong EXCISION/RELEASE BURSA HIP;  Surgeon: Johnn Hai, MD;  Location: [facility];  Service: Orthopedics;  Laterality: Left;  Excision of Trochanteric  Bursitis  . FLEXIBLE SIGMOIDOSCOPY N/A 03/22/2013   Procedure: FLEXIBLE SIGMOIDOSCOPY;  Surgeon: Danie Binder, MD;  [facility];  Service: Endoscopy;  Laterality: N/A;  2:00  . LAPAROSCOPIC LOW ANTERIOR RESECTION  07/02/2012   Procedure: LAPAROSCOPIC LOW ANTERIOR RESECTION;  Surgeon: Stark Klein, MD;  Location: [facility];  Service: General;  Laterality: N/A;  . left ankle surgery for fx  several yrs ago  . LUMBAR LAMINECTOMY/DECOMPRESSION MICRODISCECTOMY N/A 05/14/2014   Procedure: LUMBAR DECOMPRESSION L2-L3;  Surgeon: Johnn Hai, MD;  Location: [facility];  Service: Orthopedics;  Laterality: N/A;  . MASS EXCISION Left 02/02/2018   Procedure: EXCISION CYST, LEFT INNER THIGH;  Surgeon: Aviva Signs, MD;  Location: [facility];  Service: General;  Laterality: Left;  . PROCTOSCOPY  09/07/2012   Procedure: PROCTOSCOPY;  Surgeon: Stark Klein, MD;  Location: [place];  Service: General;  Laterality: N/A;     Current Meds  Medication Sig  . diphenhydrAMINE (BENADRYL) 25 MG tablet Take 25 mg by mouth every 6 (six) hours as needed for itching.  . esomeprazole (NEXIUM) 40 MG capsule Take 1 capsule (40 mg total) by mouth daily. (Needs to be seen before next refill)  . estradiol (ESTRACE) 1 MG tablet Take 1 mg by mouth daily.  Marland Kitchen linaclotide (LINZESS) 290 MCG CAPS capsule Take 1 capsule (290 mcg total) by mouth daily. (Needs to be seen before next refill) (Patient taking differently: Take 290 mcg by mouth. (Needs to be seen before next refill) Takes 2-3 x week)  . metoprolol succinate (TOPROL-XL) 25 MG 24 hr tablet Take 1 tablet (25 mg total) by mouth daily.  Marland Kitchen oxyCODONE-acetaminophen (PERCOCET) 10-325 MG tablet Take 1 tablet by mouth every 6 (six) hours as needed for pain.     Allergies:   Codeine; Ultram [tramadol]; and Latex   Social History   Tobacco Use  . Smoking status: Never Smoker  . Smokeless tobacco: Never Used  Substance Use Topics  . Alcohol use: Yes     Comment: occasional couple times per mo  . Drug use: No     Family Hx: The patient's family history includes Colon cancer in her paternal aunt; Migraines in her mother; Other in her sister.  ROS:   Please see the history of present illness.     All other systems reviewed and are negative.   Prior CV studies:   The following studies were reviewed today:  NA  Labs/Other Tests and Data Reviewed:    EKG:  An ECG dated 12/12/18 was personally reviewed today and demonstrated:  SVT, 156 bpm.  An additional ECG demonstrated sinus rhythm.  Recent Labs: 03/05/2018: BNP 7.3 12/12/2018: BUN 15; Creatinine, Ser 0.78; Hemoglobin 13.8; Magnesium 2.1; Platelets 245; Potassium 3.5; Sodium 141; TSH 3.496   Recent Lipid Panel No results found for: CHOL, TRIG, HDL, CHOLHDL, LDLCALC, LDLDIRECT  Wt Readings from Last 3 Encounters:  12/17/18 212 lb (96.2 kg)  12/12/18 214 lb (97.1 kg)  10/16/18 214 lb (97.1  kg)     Objective:    Vital Signs:  BP 130/80   Pulse 93   Ht 5\' 6"  (1.676 m)   Wt 212 lb (96.2 kg)   BMI 34.22 kg/m    VITAL SIGNS:  reviewed GEN:  no acute distress EYES:  sclerae anicteric, EOMI - Extraocular Movements Intact RESPIRATORY:  normal respiratory effort, symmetric expansion CARDIOVASCULAR:  no peripheral edema MUSCULOSKELETAL:  no obvious deformities. NEURO:  alert and oriented x 3, no obvious focal deficit PSYCH:  normal affect  ASSESSMENT & PLAN:    1. SVT: Currently on Toprol-XL 25 mg daily.  Symptomatically stable.  I discussed both medical therapy and catheter ablation. I will order a 2-D echocardiogram with Doppler to evaluate cardiac structure, function, and regional wall motion. I told her if she continues to experience symptoms of significant fatigue with metoprolol succinate, I would switch to an alternative medication. I asked her to reduce Coca-Cola consumption.   COVID-19 Education: The signs and symptoms of COVID-19 were discussed with the patient  and how to seek care for testing (follow up with PCP or arrange E-visit).  The importance of social distancing was discussed today.  Time:   Today, I have spent 30 minutes with the patient with telehealth technology discussing the above problems.     Medication Adjustments/Labs and Tests Ordered: Current medicines are reviewed at length with the patient today.  Concerns regarding medicines are outlined above.   Tests Ordered: No orders of the defined types were placed in this encounter.   Medication Changes: No orders of the defined types were placed in this encounter.   Disposition:  Follow up in 3 month(s)  Signed, Kate Sable, MD  12/17/2018 10:18 AM    [place]

## 2018-12-17 NOTE — Patient Instructions (Addendum)

## 2018-12-17 NOTE — Addendum Note (Signed)
Addended by: Laurine Blazer on: 12/17/2018 11:05 AM   Modules accepted: Orders

## 2018-12-22 ENCOUNTER — Other Ambulatory Visit: Payer: Self-pay | Admitting: Family Medicine

## 2018-12-26 ENCOUNTER — Other Ambulatory Visit: Payer: Self-pay | Admitting: Family Medicine

## 2018-12-26 ENCOUNTER — Telehealth: Payer: Self-pay | Admitting: Family Medicine

## 2018-12-26 ENCOUNTER — Ambulatory Visit (INDEPENDENT_AMBULATORY_CARE_PROVIDER_SITE_OTHER): Payer: BLUE CROSS/BLUE SHIELD | Admitting: Family Medicine

## 2018-12-26 ENCOUNTER — Telehealth: Payer: Self-pay | Admitting: *Deleted

## 2018-12-26 ENCOUNTER — Ambulatory Visit (HOSPITAL_COMMUNITY)
Admission: RE | Admit: 2018-12-26 | Discharge: 2018-12-26 | Disposition: A | Discharge status: Home / Self Care | Payer: BLUE CROSS/BLUE SHIELD | Source: Ambulatory Visit | Attending: Cardiovascular Disease | Admitting: Cardiovascular Disease

## 2018-12-26 ENCOUNTER — Other Ambulatory Visit: Payer: Self-pay

## 2018-12-26 DIAGNOSIS — I471 Supraventricular tachycardia: Secondary | ICD-10-CM | POA: Diagnosis present

## 2018-12-26 DIAGNOSIS — N898 Other specified noninflammatory disorders of vagina: Secondary | ICD-10-CM

## 2018-12-26 MED ORDER — FLUCONAZOLE 150 MG PO TABS: ORAL_TABLET | ORAL | 0 refills | Status: DC

## 2018-12-26 MED ORDER — ESOMEPRAZOLE MAGNESIUM 40 MG PO CPDR: 40.0000 mg | DELAYED_RELEASE_CAPSULE | Freq: Every day | ORAL | 1 refills | Status: DC

## 2018-12-26 NOTE — Progress Notes (Signed)
Telephone visit  Subjective: ZO:XWRUE infection PCP: Janora Norlander, DO AVW:[id number] [name] is a 53 y.o. female calls for telephone consult today. Patient provides verbal consent for consult held via phone.  Location of patient: home Location of provider: [facility] Others present for call: none  1. Yeast infection Patient reports odorless discharge that is white that started about 1 week ago.  She has been using OTC Monistat and it improved symptoms some but not resolved.  She reports vaginal itching and external burning. She has not been on antibiotics or steroids recently. She reports intercourse recently. Denies dyspareunia, abnormal vaginal bleeding.  Denies dysuria, hematuria.  LMP hx partial hysterectomy.     ROS: Per HPI  Allergies  Allergen Reactions  . Codeine Nausea Only    Confirmed intolerance of codeine verbally with pt in PACU, pt states does not cause any SOB or dyspnea, some nausea only (MN-RN)  . Ultram [Tramadol] Nausea And Vomiting    Ultram allergy also discussed, active N&V occurs with Ultram  . Latex Itching and Rash   Past Medical History:  Diagnosis Date  . Anxiety    NEW-DUE TO ANXIETY OVER DX OF CANCER  . Blood transfusion 1995  . Cancer (HCC)    RECTAL CANCER  . Closed fracture of left distal fibula 04/27/2018  . Constipation    SOME BLOOD IN STOOL  . GERD (gastroesophageal reflux disease)    occasionally-NO MEDS  . History of kidney stones   . Kidney stone   . PONV (postoperative nausea and vomiting)    used a scop patch last surgery-was better    Current Outpatient Medications:  .  diphenhydrAMINE (BENADRYL) 25 MG tablet, Take 25 mg by mouth every 6 (six) hours as needed for itching., Disp: , Rfl:  .  esomeprazole (NEXIUM) 40 MG capsule, Take 1 capsule (40 mg total) by mouth daily. (Needs to be seen before next refill), Disp: 30 capsule, Rfl: 0 .  estradiol (ESTRACE) 1 MG tablet, Take 1 mg by mouth daily., Disp: , Rfl:  .  linaclotide  (LINZESS) 290 MCG CAPS capsule, Take 1 capsule (290 mcg total) by mouth daily. (Needs to be seen before next refill) (Patient taking differently: Take 290 mcg by mouth. (Needs to be seen before next refill) Takes 2-3 x week), Disp: 30 capsule, Rfl: 0 .  metoprolol succinate (TOPROL-XL) 25 MG 24 hr tablet, Take 1 tablet (25 mg total) by mouth daily., Disp: 30 tablet, Rfl: 0 .  oxyCODONE-acetaminophen (PERCOCET) 10-325 MG tablet, Take 1 tablet by mouth every 6 (six) hours as needed for pain., Disp: , Rfl:  No current facility-administered medications for this visit.   Facility-Administered Medications Ordered in Other Visits:  .  gadopentetate dimeglumine (MAGNEVIST) injection 20 mL, 20 mL, Intravenous, Once PRN, Melvenia Beam, MD  Assessment/ Plan: 53 y.o. female   1. Vaginal pruritus Presumed candidal vaginitis.  Take 1 tablet Diflucan now and repeat in 3 days if symptoms persist.  We discussed if she has persistent symptoms after these doses that she is to schedule a visit with me either Monday or Tuesday for evaluation.  Differential diagnosis include bacterial vaginosis or STI. - fluconazole (DIFLUCAN) 150 MG tablet; Take 1 tablet now.  Repeat dose in 3 days if symptoms persist.  Dispense: 2 tablet; Refill: 0   Start time: 8:19a End time: 8:26a  Total time spent on patient care (including telephone call/ virtual visit): 15 minutes  [place], [place] [phone])  [phone]   

## 2018-12-26 NOTE — Progress Notes (Signed)
*  PRELIMINARY RESULTS* Echocardiogram 2D Echocardiogram has been performed.  [name] 12/26/2018, 11:26 AM

## 2018-12-26 NOTE — Telephone Encounter (Signed)
-----   Message from Herminio Commons, MD sent at 12/26/2018 12:44 PM EDT ----- Normal echo

## 2018-12-26 NOTE — Telephone Encounter (Signed)
Called patient with test results. No answer. Left message to call back.  

## 2018-12-26 NOTE — Telephone Encounter (Signed)
Please advise on refill.

## 2019-01-01 ENCOUNTER — Ambulatory Visit: Payer: BLUE CROSS/BLUE SHIELD | Admitting: Family Medicine

## 2019-01-01 ENCOUNTER — Encounter: Payer: Self-pay | Admitting: Family Medicine

## 2019-01-01 ENCOUNTER — Other Ambulatory Visit: Payer: Self-pay

## 2019-01-01 VITALS — BP 112/75 | HR 68 | Temp 98.1°F | Ht 66.0 in | Wt 213.0 lb

## 2019-01-01 DIAGNOSIS — B373 Candidiasis of vulva and vagina: Secondary | ICD-10-CM

## 2019-01-01 DIAGNOSIS — B372 Candidiasis of skin and nail: Secondary | ICD-10-CM | POA: Diagnosis not present

## 2019-01-01 DIAGNOSIS — B3731 Acute candidiasis of vulva and vagina: Secondary | ICD-10-CM

## 2019-01-01 DIAGNOSIS — N898 Other specified noninflammatory disorders of vagina: Secondary | ICD-10-CM

## 2019-01-01 LAB — URINALYSIS, COMPLETE
Bilirubin, UA: NEGATIVE
Glucose, UA: NEGATIVE
Ketones, UA: NEGATIVE
Leukocytes,UA: NEGATIVE
Nitrite, UA: NEGATIVE
Protein,UA: NEGATIVE
Specific Gravity, UA: 1.02 (ref 1.005–1.030)
Urobilinogen, Ur: 0.2 mg/dL (ref 0.2–1.0)
pH, UA: 6 (ref 5.0–7.5)

## 2019-01-01 LAB — MICROSCOPIC EXAMINATION: Renal Epithel, UA: NONE SEEN /hpf

## 2019-01-01 LAB — WET PREP FOR TRICH, YEAST, CLUE
Clue Cell Exam: NEGATIVE
Trichomonas Exam: NEGATIVE
Yeast Exam: POSITIVE — AB

## 2019-01-01 MED ORDER — FLUCONAZOLE 150 MG PO TABS: ORAL_TABLET | ORAL | 0 refills | Status: DC

## 2019-01-01 MED ORDER — KETOCONAZOLE 2 % EX CREA: 1.0000 "application " | TOPICAL_CREAM | Freq: Every day | CUTANEOUS | 0 refills | Status: AC

## 2019-01-01 NOTE — Patient Instructions (Signed)
Vaginal Yeast infection, Adult    Vaginal yeast infection is a condition that causes vaginal discharge as well as soreness, swelling, and redness (inflammation) of the vagina. This is a common condition. Some women get this infection frequently.  What are the causes?  This condition is caused by a change in the normal balance of the yeast (candida) and bacteria that live in the vagina. This change causes an overgrowth of yeast, which causes the inflammation.  What increases the risk?  The condition is more likely to develop in women who:   Take antibiotic medicines.   Have diabetes.   Take birth control pills.   Are pregnant.   Douche often.   Have a weak body defense system (immune system).   Have been taking steroid medicines for a long time.   Frequently wear tight clothing.  What are the signs or symptoms?  Symptoms of this condition include:   White, thick, creamy vaginal discharge.   Swelling, itching, redness, and irritation of the vagina. The lips of the vagina (vulva) may be affected as well.   Pain or a burning feeling while urinating.   Pain during sex.  How is this diagnosed?  This condition is diagnosed based on:   Your medical history.   A physical exam.   A pelvic exam. Your health care provider will examine a sample of your vaginal discharge under a microscope. Your health care provider may send this sample for testing to confirm the diagnosis.  How is this treated?  This condition is treated with medicine. Medicines may be over-the-counter or prescription. You may be told to use one or more of the following:   Medicine that is taken by mouth (orally).   Medicine that is applied as a cream (topically).   Medicine that is inserted directly into the vagina (suppository).  Follow these instructions at home:    Lifestyle   Do not have sex until your health care provider approves. Tell your sex partner that you have a yeast infection. That person should go to his or her health care  provider and ask if they should also be treated.   Do not wear tight clothes, such as pantyhose or tight pants.   Wear breathable cotton underwear.  General instructions   Take or apply over-the-counter and prescription medicines only as told by your health care provider.   Eat more yogurt. This may help to keep your yeast infection from returning.   Do not use tampons until your health care provider approves.   Try taking a sitz bath to help with discomfort. This is a warm water bath that is taken while you are sitting down. The water should only come up to your hips and should cover your buttocks. Do this 3-4 times per day or as told by your health care provider.   Do not douche.   If you have diabetes, keep your blood sugar levels under control.   Keep all follow-up visits as told by your health care provider. This is important.  Contact a health care provider if:   You have a fever.   Your symptoms go away and then return.   Your symptoms do not get better with treatment.   Your symptoms get worse.   You have new symptoms.   You develop blisters in or around your vagina.   You have blood coming from your vagina and it is not your menstrual period.   You develop pain in your abdomen.  Summary     Vaginal yeast infection is a condition that causes discharge as well as soreness, swelling, and redness (inflammation) of the vagina.   This condition is treated with medicine. Medicines may be over-the-counter or prescription.   Take or apply over-the-counter and prescription medicines only as told by your health care provider.   Do not douche. Do not have sex or use tampons until your health care provider approves.   Contact a health care provider if your symptoms do not get better with treatment or your symptoms go away and then return.  This information is not intended to replace advice given to you by your health care provider. Make sure you discuss any questions you have with your health care  provider.  Document Released: 05/11/2005 Document Revised: 12/18/2017 Document Reviewed: 12/18/2017  [employer]vier Interactive Patient Education  2019 [employer]

## 2019-01-01 NOTE — Progress Notes (Signed)
Subjective:  Patient ID: [name], female    DOB: August 11, 1966, 53 y.o.   MRN: [id number]  Chief Complaint:  Vaginal Discharge   HPI: [name] is a 53 y.o. female presenting on 01/01/2019 for Vaginal Discharge  Pt presents today for ongoing vaginal discharge and pruritis. Pt states she now has a red rash above her vagina. Pt states she took the diflucan as prescribed but the symptoms did not completely resolve. Pt states she is in a monogamous relationship and does not feel this is STI related. No fever, chills, dyspareunia, vaginal bleeding, or dysuria.   Vaginal Discharge  The patient's primary symptoms include genital itching, a genital rash and vaginal discharge. The patient's pertinent negatives include no genital lesions, genital odor, missed menses, pelvic pain or vaginal bleeding. This is a recurrent problem. The current episode started 1 to 4 weeks ago. The problem occurs constantly. The problem has been gradually worsening. The pain is mild. She is not pregnant. Associated symptoms include rash (suprapubic area). Pertinent negatives include no abdominal pain, anorexia, back pain, chills, constipation, diarrhea, discolored urine, dysuria, fever, flank pain, frequency, headaches, hematuria, joint pain, joint swelling, nausea, painful intercourse, sore throat, urgency or vomiting. The vaginal discharge was thick and white. There has been no bleeding. She has tried antifungals for the symptoms. The treatment provided mild relief. She is sexually active. No, her partner does not have an STD.     Relevant past medical, surgical, family, and social history reviewed and updated as indicated.  Allergies and medications reviewed and updated.   Past Medical History:  Diagnosis Date  . Anxiety    NEW-DUE TO ANXIETY OVER DX OF CANCER  . Blood transfusion 1995  . Cancer (HCC)    RECTAL CANCER  . Closed fracture of left distal fibula 04/27/2018  . Constipation    SOME BLOOD IN STOOL  . GERD (gastroesophageal reflux disease)    occasionally-NO MEDS  . History of kidney stones   . Kidney stone   . PONV (postoperative nausea and vomiting)    used a scop patch last surgery-was better    Past Surgical History:  Procedure Laterality Date  . ABDOMINAL HYSTERECTOMY  1995   partial  . BACK SURGERY  2015  . COLON SURGERY    . COLONOSCOPY  06/01/2012   Procedure: COLONOSCOPY;  Surgeon: Danie Binder, MD;  [facility];  Service: Endoscopy;  Laterality: N/A;  1:30PM  . COLONOSCOPY N/A 06/14/2013   [facility]:NLGX diverticulosis/normal anastomosis  . endoscopic left fallopian tube removed  several yrs ago  . EUS  06/06/2012   Procedure: LOWER ENDOSCOPIC ULTRASOUND (EUS);  Surgeon: Arta Silence, MD;  Location: [facility];  Service: Endoscopy;  Laterality: N/A;  . EXAMINATION UNDER ANESTHESIA  09/07/2012   Procedure: EXAM UNDER ANESTHESIA;  Surgeon: Stark Klein, MD;  Location: [place];  Service: General;  Laterality: N/A;  . EXCISION/RELEASE BURSA HIP  09/15/2011   Dr Tonita Cong EXCISION/RELEASE BURSA HIP;  Surgeon: Johnn Hai, MD;  Location: [facility];  Service: Orthopedics;  Laterality: Left;  Excision of Trochanteric Bursitis  . FLEXIBLE SIGMOIDOSCOPY N/A 03/22/2013   Procedure: FLEXIBLE SIGMOIDOSCOPY;  Surgeon: Danie Binder, MD;  [facility];  Service: Endoscopy;  Laterality: N/A;  2:00  . LAPAROSCOPIC LOW ANTERIOR RESECTION  07/02/2012   Procedure: LAPAROSCOPIC LOW ANTERIOR RESECTION;  Surgeon: Stark Klein, MD;  Location: [facility];  Service: General;  Laterality: N/A;  . left  ankle surgery for fx  several yrs ago  . LUMBAR LAMINECTOMY/DECOMPRESSION MICRODISCECTOMY N/A 05/14/2014   Procedure: LUMBAR DECOMPRESSION L2-L3;  Surgeon: Johnn Hai, MD;  Location: [facility];  Service: Orthopedics;  Laterality: N/A;  . MASS EXCISION Left 02/02/2018   Procedure: EXCISION CYST, LEFT INNER THIGH;  Surgeon: Aviva Signs, MD;   Location: [facility];  Service: General;  Laterality: Left;  . PROCTOSCOPY  09/07/2012   Procedure: PROCTOSCOPY;  Surgeon: Stark Klein, MD;  Location: [place];  Service: General;  Laterality: N/A;    Social History   Socioeconomic History  . Marital status: Single    Spouse name: Not on file  . Number of children: 2  . Years of education: Not on file  . Highest education level: Not on file  Occupational History  . Occupation: Hotel manager    Employer: [employer]  Social Needs  . Financial resource strain: Not on file  . Food insecurity:    Worry: Not on file    Inability: Not on file  . Transportation needs:    Medical: Not on file    Non-medical: Not on file  Tobacco Use  . Smoking status: Never Smoker  . Smokeless tobacco: Never Used  Substance and Sexual Activity  . Alcohol use: Yes    Comment: occasional couple times per mo  . Drug use: No  . Sexual activity: Yes    Birth control/protection: Surgical  Lifestyle  . Physical activity:    Days per week: Not on file    Minutes per session: Not on file  . Stress: Not on file  Relationships  . Social connections:    Talks on phone: Not on file    Gets together: Not on file    Attends religious service: Not on file    Active member of club or organization: Not on file    Attends meetings of clubs or organizations: Not on file    Relationship status: Not on file  . Intimate partner violence:    Fear of current or ex partner: Not on file    Emotionally abused: Not on file    Physically abused: Not on file    Forced sexual activity: Not on file  Other Topics Concern  . Not on file  Social History Narrative   Lives alone & 2 kids          Outpatient Encounter Medications as of 01/01/2019  Medication Sig  . diphenhydrAMINE (BENADRYL) 25 MG tablet Take 25 mg by mouth every 6 (six) hours as needed for itching.  . esomeprazole (NEXIUM) 40 MG capsule Take 1 capsule (40 mg total) by mouth  daily.  Marland Kitchen estradiol (ESTRACE) 1 MG tablet Take 1 mg by mouth daily.  Marland Kitchen linaclotide (LINZESS) 290 MCG CAPS capsule Take 1 capsule (290 mcg total) by mouth daily. (Needs to be seen before next refill) (Patient taking differently: Take 290 mcg by mouth. (Needs to be seen before next refill) Takes 2-3 x week)  . metoprolol succinate (TOPROL-XL) 25 MG 24 hr tablet Take 1 tablet (25 mg total) by mouth daily.  Marland Kitchen oxyCODONE-acetaminophen (PERCOCET) 10-325 MG tablet Take 1 tablet by mouth every 6 (six) hours as needed for pain.  . [DISCONTINUED] fluconazole (DIFLUCAN) 150 MG tablet Take 1 tablet now.  Repeat dose in 3 days if symptoms persist.  . fluconazole (DIFLUCAN) 150 MG tablet 1 po every 3 days  . ketoconazole (NIZORAL) 2 % cream Apply 1 application topically daily for  14 days.   Facility-Administered Encounter Medications as of 01/01/2019  Medication  . gadopentetate dimeglumine (MAGNEVIST) injection 20 mL    Allergies  Allergen Reactions  . Codeine Nausea Only    Confirmed intolerance of codeine verbally with pt in PACU, pt states does not cause any SOB or dyspnea, some nausea only (MN-RN)  . Ultram [Tramadol] Nausea And Vomiting    Ultram allergy also discussed, active N&V occurs with Ultram  . Latex Itching and Rash    Review of Systems  Constitutional: Negative for chills, fatigue, fever and unexpected weight change.  HENT: Negative for sore throat.   Gastrointestinal: Negative for abdominal pain, anorexia, constipation, diarrhea, nausea and vomiting.  Endocrine: Negative for polydipsia, polyphagia and polyuria.  Genitourinary: Positive for vaginal discharge. Negative for decreased urine volume, difficulty urinating, dyspareunia, dysuria, enuresis, flank pain, frequency, genital sores, hematuria, menstrual problem, missed menses, pelvic pain, urgency, vaginal bleeding and vaginal pain.  Musculoskeletal: Negative for arthralgias, back pain, joint pain, joint swelling and myalgias.   Skin: Positive for rash (suprapubic area).  Neurological: Negative for weakness and headaches.  Psychiatric/Behavioral: Negative for confusion.  All other systems reviewed and are negative.       Objective:  BP 112/75 (BP Location: Left Arm)   Pulse 68   Temp 98.1 F (36.7 C) (Oral)   Ht 5\' 6"  (1.676 m)   Wt 213 lb (96.6 kg)   BMI 34.38 kg/m    Wt Readings from Last 3 Encounters:  01/01/19 213 lb (96.6 kg)  12/17/18 212 lb (96.2 kg)  12/12/18 214 lb (97.1 kg)    Physical Exam Vitals signs and nursing note reviewed.  Constitutional:      General: She is not in acute distress.    Appearance: Normal appearance. She is well-developed and well-groomed. She is not ill-appearing or toxic-appearing.  HENT:     Head: Normocephalic and atraumatic.     Nose: Nose normal.     Mouth/Throat:     Mouth: Mucous membranes are moist.     Pharynx: Oropharynx is clear.  Eyes:     Conjunctiva/sclera: Conjunctivae normal.     Pupils: Pupils are equal, round, and reactive to light.  Neck:     Musculoskeletal: Neck supple.  Cardiovascular:     Rate and Rhythm: Normal rate and regular rhythm.     Heart sounds: Normal heart sounds. No murmur. No friction rub. No gallop.   Pulmonary:     Effort: Pulmonary effort is normal. No respiratory distress.     Breath sounds: Normal breath sounds.  Abdominal:     General: Bowel sounds are normal. There is no distension.     Palpations: Abdomen is soft.     Tenderness: There is no abdominal tenderness. There is no right CVA tenderness or left CVA tenderness.  Genitourinary:    Comments: Pt did self wet prep. Red rash to suprapubic area. Skin:    General: Skin is warm and dry.     Capillary Refill: Capillary refill takes less than 2 seconds.  Neurological:     General: No focal deficit present.     Mental Status: She is alert and oriented to person, place, and time.  Psychiatric:        Attention and Perception: Attention and perception normal.         Mood and Affect: Mood and affect normal.        Speech: Speech normal.        Behavior: Behavior normal. Behavior  is cooperative.        Thought Content: Thought content normal.        Cognition and Memory: Cognition and memory normal.        Judgment: Judgment normal.     Results for orders placed or performed during the hospital encounter of 15/17/61  Basic metabolic panel  Result Value Ref Range   Sodium 141 135 - 145 mmol/L   Potassium 3.5 3.5 - 5.1 mmol/L   Chloride 107 98 - 111 mmol/L   CO2 25 22 - 32 mmol/L   Glucose, Bld 71 70 - 99 mg/dL   BUN 15 6 - 20 mg/dL   Creatinine, Ser 0.78 0.44 - 1.00 mg/dL   Calcium 9.0 8.9 - 10.3 mg/dL   GFR calc non Af Amer >60 >60 mL/min   GFR calc Af Amer >60 >60 mL/min   Anion gap 9 5 - 15  CBC  Result Value Ref Range   WBC 8.1 4.0 - 10.5 K/uL   RBC 4.66 3.87 - 5.11 MIL/uL   Hemoglobin 13.8 12.0 - 15.0 g/dL   HCT 43.8 36.0 - 46.0 %   MCV 94.0 80.0 - 100.0 fL   MCH 29.6 26.0 - 34.0 pg   MCHC 31.5 30.0 - 36.0 g/dL   RDW 12.8 11.5 - 15.5 %   Platelets 245 150 - 400 K/uL   nRBC 0.0 0.0 - 0.2 %  Troponin I - ONCE - STAT  Result Value Ref Range   Troponin I 0.10 (HH) <0.03 ng/mL  Magnesium  Result Value Ref Range   Magnesium 2.1 1.7 - 2.4 mg/dL  TSH  Result Value Ref Range   TSH 3.496 0.350 - 4.500 uIU/mL  D-dimer, quantitative (not at [facility])  Result Value Ref Range   D-Dimer, Quant 0.28 0.00 - 0.50 ug/mL-FEU  Troponin I - ONCE - STAT  Result Value Ref Range   Troponin I 0.17 (HH) <0.03 ng/mL  I-stat troponin, ED  Result Value Ref Range   Troponin i, poc 0.14 (HH) 0.00 - 0.08 ng/mL   Comment NOTIFIED PHYSICIAN    Comment 3               Pertinent labs & imaging results that were available during my care of the patient were reviewed by me and considered in my medical decision making.  Assessment & Plan:  Lakaya was seen today for vaginal discharge.  Diagnoses and all orders for this visit:  Vaginal discharge  Urinalysis unremarkable in office. Wet prep positive for yeast, negative for trich and clue cells.  -     Urinalysis, Complete -     WET PREP FOR TRICH, YEAST, CLUE -     Chlamydia/Gonococcus/Trichomonas, NAA  Vaginal candidiasis Recurrent vaginal candidiasis. No glucose in urine, last glucose 71. Will treat with diflucan every 72 hours for 3 doses. Vaginal hygiene discussed. Report new, worsening, or unresolved symptoms. Over the counter probiotic recommended.  -     fluconazole (DIFLUCAN) 150 MG tablet; 1 po every 3 days  Cutaneous candidiasis Beefy red pruritic rash to suprapubic region. Will treat with topical ketoconazole. Vaginal hygiene discussed. White cotton underwear. Keep area as dry as possible.  -     ketoconazole (NIZORAL) 2 % cream; Apply 1 application topically daily for 14 days.     Continue all other maintenance medications.  Follow up plan: Return if symptoms worsen or fail to improve.  Educational handout given for yeast infection  The above assessment and management  plan was discussed with the patient. The patient verbalized understanding of and has agreed to the management plan. Patient is aware to call the clinic if symptoms persist or worsen. Patient is aware when to return to the clinic for a follow-up visit. Patient educated on when it is appropriate to go to the emergency department.   Monia Pouch, FNP-C [facility] [phone]

## 2019-01-04 ENCOUNTER — Ambulatory Visit: Payer: BLUE CROSS/BLUE SHIELD | Admitting: Family Medicine

## 2019-01-04 LAB — CHLAMYDIA/GONOCOCCUS/TRICHOMONAS, NAA
Chlamydia by NAA: NEGATIVE
Gonococcus by NAA: NEGATIVE
Trich vag by NAA: NEGATIVE

## 2019-03-18 ENCOUNTER — Telehealth: Payer: Self-pay | Admitting: Family Medicine

## 2019-03-18 NOTE — Telephone Encounter (Signed)
Pt notified no appts available for Weds or Thurs

## 2019-03-20 ENCOUNTER — Ambulatory Visit: Payer: BLUE CROSS/BLUE SHIELD | Admitting: Physician Assistant

## 2019-03-20 ENCOUNTER — Other Ambulatory Visit: Payer: Self-pay

## 2019-03-20 ENCOUNTER — Encounter: Payer: Self-pay | Admitting: Physician Assistant

## 2019-03-20 VITALS — BP 125/83 | HR 76 | Temp 96.8°F | Ht 66.0 in | Wt 209.8 lb

## 2019-03-20 DIAGNOSIS — M7711 Lateral epicondylitis, right elbow: Secondary | ICD-10-CM

## 2019-03-20 MED ORDER — METHYLPREDNISOLONE ACETATE 80 MG/ML IJ SUSP
80.0000 mg | Freq: Once | INTRAMUSCULAR | Status: AC
  Administered 2019-03-20: 80 mg via INTRAMUSCULAR

## 2019-03-20 NOTE — Patient Instructions (Signed)
Tennis Elbow Tennis elbow is swelling (inflammation) in your outer forearm, near your elbow. Swelling affects the tissues that connect muscle to bone (tendons). Tennis elbow can happen in any sport or job in which you use your elbow too much. It is caused by doing the same motion over and over. Tennis elbow can cause:  Pain and tenderness in your forearm and the outer part of your elbow. You may have pain all the time, or only when using the arm.  A burning feeling. This runs from your elbow through your arm.  Weak grip in your hand. Follow these instructions at home: Activity  Rest your elbow and wrist. Avoid activities that cause problems, as told by your doctor.  If told by your doctor, wear an elbow strap to reduce stress on the area.  Do physical therapy exercises as told.  If you lift an object, lift it with your palm facing up. This is easier on your elbow. Lifestyle  If your tennis elbow is caused by sports, check your equipment and make sure that: ? You are using it correctly. ? It fits you well.  If your tennis elbow is caused by work or by using a computer, take breaks often to stretch your arm. Talk with your manager about how you can manage your condition at work. If you have a brace:  Wear the brace as told by your doctor. Remove it only as told by your doctor.  Loosen the brace if your fingers tingle, get numb, or turn cold and blue.  Keep the brace clean.  If the brace is not waterproof, ask your doctor if you may take the brace off for bathing. If you must keep the brace on while bathing: ? Do not let it get wet. ? Cover it with a watertight covering when you take a bath or a shower. General instructions   If told, put ice on the painful area: ? Put ice in a plastic bag. ? Place a towel between your skin and the bag. ? Leave the ice on for 20 minutes, 2-3 times a day.  Take over-the-counter and prescription medicines only as told by your doctor.  Keep  all follow-up visits as told by your doctor. This is important. Contact a doctor if:  Your pain does not get better with treatment.  Your pain gets worse.  You have weakness in your forearm, hand, or fingers.  You cannot feel your forearm, hand, or fingers. Summary  Tennis elbow is swelling (inflammation) in your outer forearm, near your elbow.  Tennis elbow is caused by doing the same motion over and over.  Rest your elbow and wrist. Avoid activities that cause problems, as told by your doctor.  If told, put ice on the painful area for 20 minutes, 2-3 times a day. This information is not intended to replace advice given to you by your health care provider. Make sure you discuss any questions you have with your health care provider. Document Released: 01/19/2010 Document Revised: 04/27/2018 Document Reviewed: 05/16/2017 Elsevier Patient Education  2020 [employer]  

## 2019-03-22 NOTE — Progress Notes (Signed)
BP 125/83   Pulse 76   Temp (!) 96.8 F (36 C) (Temporal)   Ht 5\' 6"  (1.676 m)   Wt 209 lb 12.8 oz (95.2 kg)   BMI 33.86 kg/m    Subjective:    Patient ID: [name], female    DOB: [birth year], 53 y.o.   MRN: [id number]  HPI: [name] is a 53 y.o. female presenting on 03/20/2019 for Elbow Pain (right )  This patient complains of right elbow pain.  Is been going on for 1 month.  She does do a repetitious job which causes her to reach and bend her elbow a lot.  The pain is primarily at the lateral epicondyles.  She states that she does feel weak whenever she is moving things.  She states she has not dropped anything.  She will be having carpal tunnel surgery on the left on August 13.  She does have carpal tunnel syndrome bilaterally.   Past Medical History:  Diagnosis Date  . Anxiety    NEW-DUE TO ANXIETY OVER DX OF CANCER  . Blood transfusion 1995  . Cancer (HCC)    RECTAL CANCER  . Closed fracture of left distal fibula 04/27/2018  . Constipation    SOME BLOOD IN STOOL  . GERD (gastroesophageal reflux disease)    occasionally-NO MEDS  . History of kidney stones   . Kidney stone   . PONV (postoperative nausea and vomiting)    used a scop patch last surgery-was better   Relevant past medical, surgical, family and social history reviewed and updated as indicated. Interim medical history since our last visit reviewed. Allergies and medications reviewed and updated. DATA REVIEWED: CHART IN EPIC  Family History reviewed for pertinent findings.  Review of Systems  Constitutional: Negative.   HENT: Negative.   Eyes: Negative.   Respiratory: Negative.   Gastrointestinal: Negative.   Genitourinary: Negative.   Musculoskeletal: Positive for arthralgias and joint swelling.    Allergies as of 03/20/2019      Reactions   Codeine Nausea Only   Confirmed intolerance of codeine verbally with pt in PACU, pt states does not cause any SOB or dyspnea, some  nausea only (MN-RN)   Ultram [tramadol] Nausea And Vomiting   Ultram allergy also discussed, active N&V occurs with Ultram   Latex Itching, Rash      Medication List       Accurate as of March 20, 2019 11:59 PM. If you have any questions, ask your nurse or doctor.        STOP taking these medications   diphenhydrAMINE 25 MG tablet Commonly known as: BENADRYL Stopped by: Terald Sleeper, PA-C   fluconazole 150 MG tablet Commonly known as: Diflucan Stopped by: Terald Sleeper, PA-C   metoprolol succinate 25 MG 24 hr tablet Commonly known as: TOPROL-XL Stopped by: Terald Sleeper, PA-C     TAKE these medications   esomeprazole 40 MG capsule Commonly known as: NEXIUM Take 1 capsule (40 mg total) by mouth daily.   estradiol 1 MG tablet Commonly known as: ESTRACE Take 1 mg by mouth daily.   linaclotide 290 MCG Caps capsule Commonly known as: Linzess Take 1 capsule (290 mcg total) by mouth daily. (Needs to be seen before next refill) What changed:   when to take this  additional instructions   oxyCODONE-acetaminophen 10-325 MG tablet Commonly known as: PERCOCET Take 1 tablet by mouth every 6 (six) hours as needed for pain.  Objective:    BP 125/83   Pulse 76   Temp (!) 96.8 F (36 C) (Temporal)   Ht 5\' 6"  (1.676 m)   Wt 209 lb 12.8 oz (95.2 kg)   BMI 33.86 kg/m   Allergies  Allergen Reactions  . Codeine Nausea Only    Confirmed intolerance of codeine verbally with pt in PACU, pt states does not cause any SOB or dyspnea, some nausea only (MN-RN)  . Ultram [Tramadol] Nausea And Vomiting    Ultram allergy also discussed, active N&V occurs with Ultram  . Latex Itching and Rash    Wt Readings from Last 3 Encounters:  03/20/19 209 lb 12.8 oz (95.2 kg)  01/01/19 213 lb (96.6 kg)  12/17/18 212 lb (96.2 kg)    Physical Exam Constitutional:      Appearance: She is well-developed.  HENT:     Head: Normocephalic and atraumatic.  Eyes:      Conjunctiva/sclera: Conjunctivae normal.     Pupils: Pupils are equal, round, and reactive to light.  Cardiovascular:     Rate and Rhythm: Normal rate and regular rhythm.     Heart sounds: Normal heart sounds.  Pulmonary:     Effort: Pulmonary effort is normal.  Musculoskeletal:     Right elbow: She exhibits decreased range of motion and swelling. Tenderness found. Lateral epicondyle tenderness noted.       Arms:  Skin:    General: Skin is warm and dry.     Findings: No rash.  Neurological:     Mental Status: She is alert and oriented to person, place, and time.     Deep Tendon Reflexes: Reflexes are normal and symmetric.         Assessment & Plan:   1. Lateral epicondylitis of right elbow - methylPREDNISolone acetate (DEPO-MEDROL) injection 80 mg   Continue all other maintenance medications as listed above.  Follow up plan: No follow-ups on file.  Educational handout [name] for tennis elbow  Terald Sleeper PA-C [place] [facility] [address]  [place], [place] [address] [phone]   03/22/2019, 1:39 PM

## 2019-03-27 ENCOUNTER — Ambulatory Visit: Payer: BLUE CROSS/BLUE SHIELD | Admitting: Orthopedic Surgery

## 2019-04-09 ENCOUNTER — Ambulatory Visit: Payer: BLUE CROSS/BLUE SHIELD | Admitting: Cardiovascular Disease

## 2019-04-10 ENCOUNTER — Ambulatory Visit: Payer: BLUE CROSS/BLUE SHIELD | Admitting: Orthopedic Surgery

## 2019-04-10 ENCOUNTER — Encounter

## 2019-05-20 ENCOUNTER — Other Ambulatory Visit: Payer: Self-pay | Admitting: Family Medicine

## 2019-08-06 ENCOUNTER — Ambulatory Visit: Payer: BC Managed Care – PPO | Attending: Internal Medicine

## 2019-08-06 ENCOUNTER — Other Ambulatory Visit: Payer: Self-pay

## 2019-08-06 DIAGNOSIS — Z20822 Contact with and (suspected) exposure to covid-19: Secondary | ICD-10-CM

## 2019-08-08 ENCOUNTER — Encounter: Payer: Self-pay | Admitting: Family Medicine

## 2019-08-08 LAB — NOVEL CORONAVIRUS, NAA: SARS-CoV-2, NAA: NOT DETECTED

## 2019-12-05 ENCOUNTER — Ambulatory Visit: Payer: BC Managed Care – PPO | Admitting: Gastroenterology

## 2019-12-05 NOTE — Progress Notes (Signed)
Primary Care Physician: Janora Norlander, DO  Primary Gastroenterologist:  Barney Drain, MD   Chief Complaint  Patient presents with  . Constipation    HPI: [name] is a 54 y.o. female here for further evaluation of constipation. She was last seen in 04/2017. She has h/o rectal cancer diagnosed in 2013 s/p LAR. Last colonoscopy 05/2013 with normal TI, mild diverticulosis, normal anastomosis. Next colonoscopy advised for 2019. She was sent reminder letter but did not make appointment.   Patient reports that her constipation patient is poorly well.  She admits that she can take Linzess only 2-3 times per week because of her job.  She does not have access to a bathroom whenever she needs it so she does not take it on days that she works.  Sometimes the Linzess will work her within a couple of hours but other times she does not get good results.  She is taking Linzess with a fatty meal stating that she was advised to do it this way previously.  In the past she is used various bowel regimens including magnesium citrate, MiraLAX, Benefiber but has not taken any of these on a regular basis in the most recent past.  She is open for further suggestions because she would like to take a bowel regimen on a daily basis if tolerated.  She rarely takes Percocet because of her fear of constipation.  She may go up to 5 days at a time without a bowel movement and at that point she has abdominal bloating and discomfort.  She denies any blood in the stool.  Heartburn is well controlled on Nexium.  No dysphagia or vomiting.  No unintentional weight loss.    Current Outpatient Medications  Medication Sig Dispense Refill  . cyclobenzaprine (FLEXERIL) 10 MG tablet Take 10 mg by mouth 3 (three) times daily as needed.    Marland Kitchen esomeprazole (NEXIUM) 40 MG capsule Take 1 capsule (40 mg total) by mouth daily. (Needs to be seen before next refill) 30 capsule 0  . estradiol (ESTRACE) 1 MG tablet Take 1  mg by mouth daily.    Marland Kitchen linaclotide (LINZESS) 290 MCG CAPS capsule Take 1 capsule (290 mcg total) by mouth daily. (Needs to be seen before next refill) (Patient taking differently: Take 290 mcg by mouth as needed. (Needs to be seen before next refill) Takes 2-3 x week) 30 capsule 0  . oxyCODONE-acetaminophen (PERCOCET) 10-325 MG tablet Take 1 tablet by mouth every 6 (six) hours as needed for pain.     No current facility-administered medications for this visit.   Facility-Administered Medications Ordered in Other Visits  Medication Dose Route Frequency Provider Last Rate Last Admin  . gadopentetate dimeglumine (MAGNEVIST) injection 20 mL  20 mL Intravenous Once PRN Melvenia Beam, MD        Allergies as of 12/06/2019 - Review Complete 12/06/2019  Allergen Reaction Noted  . Codeine Nausea Only 12/15/2010  . Ultram [tramadol] Nausea And Vomiting 05/25/2012  . Latex Itching and Rash 07/03/2012   Past Medical History:  Diagnosis Date  . Anxiety    NEW-DUE TO ANXIETY OVER DX OF CANCER  . Blood transfusion 1995  . Cancer (HCC)    RECTAL CANCER  . Closed fracture of left distal fibula 04/27/2018  . Constipation    SOME BLOOD IN STOOL  . GERD (gastroesophageal reflux disease)    occasionally-NO MEDS  . History of kidney stones   . Kidney stone   .  PONV (postoperative nausea and vomiting)    used a scop patch last surgery-was better   Past Surgical History:  Procedure Laterality Date  . ABDOMINAL HYSTERECTOMY  1995   partial  . BACK SURGERY  2015  . COLON SURGERY    . COLONOSCOPY  06/01/2012   Procedure: COLONOSCOPY;  Surgeon: Danie Binder, MD;  Location: [facility];  Service: Endoscopy;  Laterality: N/A;  1:30PM  . COLONOSCOPY N/A 06/14/2013   [id number] diverticulosis/normal anastomosis  . endoscopic left fallopian tube removed  several yrs ago  . EUS  06/06/2012   Procedure: LOWER ENDOSCOPIC ULTRASOUND (EUS);  Surgeon: Arta Silence, MD;  Location: [facility];   Service: Endoscopy;  Laterality: N/A;  . EXAMINATION UNDER ANESTHESIA  09/07/2012   Procedure: EXAM UNDER ANESTHESIA;  Surgeon: Stark Klein, MD;  Location: [place];  Service: General;  Laterality: N/A;  . EXCISION/RELEASE BURSA HIP  09/15/2011   Dr Tonita Cong EXCISION/RELEASE BURSA HIP;  Surgeon: Johnn Hai, MD;  Location: [facility];  Service: Orthopedics;  Laterality: Left;  Excision of Trochanteric Bursitis  . FLEXIBLE SIGMOIDOSCOPY N/A 03/22/2013   Procedure: FLEXIBLE SIGMOIDOSCOPY;  Surgeon: Danie Binder, MD;  Location: [facility];  Service: Endoscopy;  Laterality: N/A;  2:00  . LAPAROSCOPIC LOW ANTERIOR RESECTION  07/02/2012   Procedure: LAPAROSCOPIC LOW ANTERIOR RESECTION;  Surgeon: Stark Klein, MD;  Location: [facility];  Service: General;  Laterality: N/A;  . left ankle surgery for fx  several yrs ago  . LUMBAR LAMINECTOMY/DECOMPRESSION MICRODISCECTOMY N/A 05/14/2014   Procedure: LUMBAR DECOMPRESSION L2-L3;  Surgeon: Johnn Hai, MD;  Location: [facility];  Service: Orthopedics;  Laterality: N/A;  . MASS EXCISION Left 02/02/2018   Procedure: EXCISION CYST, LEFT INNER THIGH;  Surgeon: Aviva Signs, MD;  Location: [facility];  Service: General;  Laterality: Left;  . PROCTOSCOPY  09/07/2012   Procedure: PROCTOSCOPY;  Surgeon: Stark Klein, MD;  Location: [place];  Service: General;  Laterality: N/A;   Family History  Problem Relation Age of Onset  . Other Sister   . Migraines Mother   . Colon cancer Paternal Aunt    Social History   Tobacco Use  . Smoking status: Never Smoker  . Smokeless tobacco: Never Used  Substance Use Topics  . Alcohol use: Yes    Comment: occasional couple times per mo  . Drug use: No    ROS:  General: Negative for anorexia, weight loss, fever, chills, fatigue, weakness. ENT: Negative for hoarseness, difficulty swallowing , nasal congestion. CV: Negative for chest pain, angina, palpitations, dyspnea on exertion, peripheral  edema.  Respiratory: Negative for dyspnea at rest, dyspnea on exertion, cough, sputum, wheezing.  GI: See history of present illness. GU:  Negative for dysuria, hematuria, urinary incontinence, urinary frequency, nocturnal urination.  Endo: Negative for unusual weight change.    Physical Examination:   BP 132/83   Pulse 80   Temp (!) 97.1 F (36.2 C) (Temporal)   Ht 5\' 6"  (1.676 m)   Wt 212 lb 6.4 oz (96.3 kg)   BMI 34.28 kg/m   General: Well-nourished, well-developed in no acute distress.  Eyes: No icterus. Mouth: masked Lungs: Clear to auscultation bilaterally.  Heart: Regular rate and rhythm, no murmurs rubs or gallops.  Abdomen: Bowel sounds are normal, nontender, nondistended, no hepatosplenomegaly or masses, no abdominal bruits or hernia , no rebound or guarding.   Extremities: No lower extremity edema. No clubbing or deformities. Neuro: Alert and oriented x  4   Skin: Warm and dry, no jaundice.   Psych: Alert and cooperative, normal mood and affect.  Labs:  Lab Results  Component Value Date   WBC 8.1 12/12/2018   HGB 13.8 12/12/2018   HCT 43.8 12/12/2018   MCV 94.0 12/12/2018   PLT 245 12/12/2018   Lab Results  Component Value Date   CREATININE 0.78 12/12/2018   BUN 15 12/12/2018   NA 141 12/12/2018   K 3.5 12/12/2018   CL 107 12/12/2018   CO2 25 12/12/2018      Imaging Studies: No results found.

## 2019-12-06 ENCOUNTER — Other Ambulatory Visit: Payer: Self-pay

## 2019-12-06 ENCOUNTER — Encounter: Payer: Self-pay | Admitting: *Deleted

## 2019-12-06 ENCOUNTER — Encounter: Payer: Self-pay | Admitting: Gastroenterology

## 2019-12-06 ENCOUNTER — Telehealth: Payer: Self-pay | Admitting: *Deleted

## 2019-12-06 ENCOUNTER — Ambulatory Visit: Payer: 59 | Admitting: Gastroenterology

## 2019-12-06 VITALS — BP 132/83 | HR 80 | Temp 97.1°F | Ht 66.0 in | Wt 212.4 lb

## 2019-12-06 DIAGNOSIS — K219 Gastro-esophageal reflux disease without esophagitis: Secondary | ICD-10-CM

## 2019-12-06 DIAGNOSIS — K5900 Constipation, unspecified: Secondary | ICD-10-CM

## 2019-12-06 DIAGNOSIS — C20 Malignant neoplasm of rectum: Secondary | ICD-10-CM

## 2019-12-06 MED ORDER — ESOMEPRAZOLE MAGNESIUM 40 MG PO CPDR: 40.0000 mg | DELAYED_RELEASE_CAPSULE | Freq: Every day | ORAL | 3 refills | Status: DC

## 2019-12-06 MED ORDER — CLENPIQ 10-3.5-12 MG-GM -GM/160ML PO SOLN: 1.0000 | Freq: Once | ORAL | 0 refills | Status: AC

## 2019-12-06 NOTE — Telephone Encounter (Signed)
PA approved via [facility] website for TCS. Auth# [id number] dates 02/18/2020-05/18/2020

## 2019-12-06 NOTE — Assessment & Plan Note (Signed)
GERD well controlled on Nexium.  New prescription provided.

## 2019-12-06 NOTE — Patient Instructions (Addendum)
For constipation: Miralax one capful once to twice daily. FiberChoice chew two daily.  Drink 100 ounces of water each day. Eat more fruits and veggies. Try to walk 20-30 minutes five times per week.  You can still take Linzess 252mcg as needed. Call if your bowels are not regular and we can make changes.   For GERD: Continue Nexium 40mg  daily before breakfast.   For history of rectal cancer: Colonoscopy as scheduled. See separate instructions.      High-Fiber Diet Fiber, also called dietary fiber, is a type of carbohydrate that is found in fruits, vegetables, whole grains, and beans. A high-fiber diet can have many health benefits. Your health care provider may recommend a high-fiber diet to help:  Prevent constipation. Fiber can make your bowel movements more regular.  Lower your cholesterol.  Relieve the following conditions: ? Swelling of veins in the anus (hemorrhoids). ? Swelling and irritation (inflammation) of specific areas of the digestive tract (uncomplicated diverticulosis). ? A problem of the large intestine (colon) that sometimes causes pain and diarrhea (irritable bowel syndrome, IBS).  Prevent overeating as part of a weight-loss plan.  Prevent heart disease, type 2 diabetes, and certain cancers. What is my plan? The recommended daily fiber intake in grams (g) includes:  38 g for men age 52 or younger.  30 g for men over age 52.  60 g for women age 52 or younger.  21 g for women over age 52. You can get the recommended daily intake of dietary fiber by:  Eating a variety of fruits, vegetables, grains, and beans.  Taking a fiber supplement, if it is not possible to get enough fiber through your diet. What do I need to know about a high-fiber diet?  It is better to get fiber through food sources rather than from fiber supplements. There is not a lot of research about how effective supplements are.  Always check the fiber content on the nutrition facts  label of any prepackaged food. Look for foods that contain 5 g of fiber or more per serving.  Talk with a diet and nutrition specialist (dietitian) if you have questions about specific foods that are recommended or not recommended for your medical condition, especially if those foods are not listed below.  Gradually increase how much fiber you consume. If you increase your intake of dietary fiber too quickly, you may have bloating, cramping, or gas.  Drink plenty of water. Water helps you to digest fiber. What are tips for following this plan?  Eat a wide variety of high-fiber foods.  Make sure that half of the grains that you eat each day are whole grains.  Eat breads and cereals that are made with whole-grain flour instead of refined flour or white flour.  Eat brown rice, bulgur wheat, or millet instead of white rice.  Start the day with a breakfast that is high in fiber, such as a cereal that contains 5 g of fiber or more per serving.  Use beans in place of meat in soups, salads, and pasta dishes.  Eat high-fiber snacks, such as berries, raw vegetables, nuts, and popcorn.  Choose whole fruits and vegetables instead of processed forms like juice or sauce. What foods can I eat?  Fruits Berries. Pears. Apples. Oranges. Avocado. Prunes and raisins. Dried figs. Vegetables Sweet potatoes. Spinach. Kale. Artichokes. Cabbage. Broccoli. Cauliflower. Green peas. Carrots. Squash. Grains Whole-grain breads. Multigrain cereal. Oats and oatmeal. Brown rice. Barley. Bulgur wheat. [place]. Quinoa. Bran muffins. Popcorn. Rye  wafer crackers. Meats and other proteins Navy, kidney, and pinto beans. Soybeans. Split peas. Lentils. Nuts and seeds. Dairy Fiber-fortified yogurt. Beverages Fiber-fortified soy milk. Fiber-fortified orange juice. Other foods Fiber bars. The items listed above may not be a complete list of recommended foods and beverages. Contact a dietitian for more options. What  foods are not recommended? Fruits Fruit juice. Cooked, strained fruit. Vegetables Fried potatoes. Canned vegetables. Well-cooked vegetables. Grains White bread. Pasta made with refined flour. White rice. Meats and other proteins Fatty cuts of meat. Fried chicken or fried fish. Dairy Milk. Yogurt. Cream cheese. Sour cream. Fats and oils Butters. Beverages Soft drinks. Other foods Cakes and pastries. The items listed above may not be a complete list of foods and beverages to avoid. Contact a dietitian for more information. Summary  Fiber is a type of carbohydrate. It is found in fruits, vegetables, whole grains, and beans.  There are many health benefits of eating a high-fiber diet, such as preventing constipation, lowering blood cholesterol, helping with weight loss, and reducing your risk of heart disease, diabetes, and certain cancers.  Gradually increase your intake of fiber. Increasing too fast can result in cramping, bloating, and gas. Drink plenty of water while you increase your fiber.  The best sources of fiber include whole fruits and vegetables, whole grains, nuts, seeds, and beans. This information is not intended to replace advice given to you by your health care provider. Make sure you discuss any questions you have with your health care provider. Document Revised: 06/05/2017 Document Reviewed: 06/05/2017 Elsevier Patient Education  2020 [employer].

## 2019-12-06 NOTE — Assessment & Plan Note (Signed)
Chronic constipation, rarely uses narcotics.  Linzess works but she is not able to take on a regular basis due to poor access to a bathroom while at work.  Need to find a daily regimen that she can tolerate and that is effective.  We will start her on Fiberchoice chew 2 daily.  MiraLAX 1 capful 1-2 times daily.  She will call and let me know if this regimen is not working for her.  She can still utilize Linzess on the day she is not working if needed.  She is due for surveillance colonoscopy for history of rectal cancer.  Plan for colonoscopy, in July per her request.  Plans for deep sedation given opioid use.  I have discussed the risks, alternatives, benefits with regards to but not limited to the risk of reaction to medication, bleeding, infection, perforation and the patient is agreeable to proceed. Written consent to be obtained.

## 2019-12-09 ENCOUNTER — Encounter: Payer: Self-pay | Admitting: *Deleted

## 2020-02-14 ENCOUNTER — Other Ambulatory Visit: Payer: Self-pay

## 2020-02-14 ENCOUNTER — Other Ambulatory Visit (HOSPITAL_COMMUNITY)
Admission: RE | Admit: 2020-02-14 | Discharge: 2020-02-14 | Disposition: A | Discharge status: Home / Self Care | Payer: 59 | Source: Ambulatory Visit | Attending: Internal Medicine | Admitting: Internal Medicine

## 2020-02-14 ENCOUNTER — Encounter (HOSPITAL_COMMUNITY)
Admission: RE | Admit: 2020-02-14 | Discharge: 2020-02-14 | Disposition: A | Discharge status: Home / Self Care | Payer: 59 | Source: Ambulatory Visit | Attending: Internal Medicine | Admitting: Internal Medicine

## 2020-02-14 ENCOUNTER — Encounter (HOSPITAL_COMMUNITY): Payer: Self-pay

## 2020-02-14 DIAGNOSIS — Z20822 Contact with and (suspected) exposure to covid-19: Secondary | ICD-10-CM | POA: Diagnosis not present

## 2020-02-14 DIAGNOSIS — Z01812 Encounter for preprocedural laboratory examination: Secondary | ICD-10-CM | POA: Diagnosis present

## 2020-02-14 LAB — SARS CORONAVIRUS 2 (TAT 6-24 HRS): SARS Coronavirus 2: NEGATIVE

## 2020-02-14 NOTE — Patient Instructions (Signed)
[name]  02/14/2020     @PREFPERIOPPHARMACY @   Your procedure is scheduled on 02/18/2020.  Report to [facility] at 8:30 A.M.  Call this number if you have problems the morning of surgery:  [phone]   Remember:  Do not eat or drink after midnight.   Please follow Prep instructions from Dr. Roseanne Kaufman  office     Take these medicines the morning of surgery with A SIP OF WATER : Nexium Flexeril and Oxycodine(if needed)    Do not wear jewelry, make-up or nail polish.  Do not wear lotions, powders, or perfumes, or deodorant.  Do not shave 48 hours prior to surgery.  Men may shave face and neck.  Do not bring valuables to the hospital.  [facility] is not responsible for any belongings or valuables.  Contacts, dentures or bridgework may not be worn into surgery.  Leave your suitcase in the car.  After surgery it may be brought to your room.  For patients admitted to the hospital, discharge time will be determined by your treatment team.  Patients discharged the day of surgery will not be allowed to drive home.   Name and phone number of your driver:   family Special instructions:  n/a  Please read over the following fact sheets that you were given. Care and Recovery After Surgery    Colonoscopy, Adult A colonoscopy is a procedure to look at the entire large intestine. This procedure is done using a long, thin, flexible tube that has a camera on the end. You may have a colonoscopy:  As a part of normal colorectal screening.  If you have certain symptoms, such as: ? A low number of red blood cells in your blood (anemia). ? Diarrhea that does not go away. ? Pain in your abdomen. ? Blood in your stool. A colonoscopy can help screen for and diagnose medical problems, including:  Tumors.  Extra tissue that grows where mucus forms (polyps).  Inflammation.  Areas of bleeding. Tell your health care provider about:  Any allergies you have.  All medicines  you are taking, including vitamins, herbs, eye drops, creams, and over-the-counter medicines.  Any problems you or family members have had with anesthetic medicines.  Any blood disorders you have.  Any surgeries you have had.  Any medical conditions you have.  Any problems you have had with having bowel movements.  Whether you are pregnant or may be pregnant. What are the risks? Generally, this is a safe procedure. However, problems may occur, including:  Bleeding.  Damage to your intestine.  Allergic reactions to medicines given during the procedure.  Infection. This is rare. What happens before the procedure? Eating and drinking restrictions Follow instructions from your health care provider about eating or drinking restrictions, which may include:  A few days before the procedure: ? Follow a low-fiber diet. ? Avoid nuts, seeds, dried fruit, raw fruits, and vegetables.  1-3 days before the procedure: ? Eat only gelatin dessert or ice pops. ? Drink only clear liquids, such as water, clear juice, clear broth or bouillon, black coffee or tea, or clear soft drinks or sports drinks. ? Avoid liquids that contain red or purple dye.  The day of the procedure: ? Do not eat solid foods. You may continue to drink clear liquids until up to 2 hours before the procedure. ? Do not eat or drink anything starting 2 hours before the procedure, or within the time period that your health care  provider recommends. Bowel prep If you were prescribed a bowel prep to take by mouth (orally) to clean out your colon:  Take it as told by your health care provider. Starting the day before your procedure, you will need to drink a large amount of liquid medicine. The liquid will cause you to have many bowel movements of loose stool until your stool becomes almost clear or light green.  If your skin or the opening between the buttocks (anus) gets irritated from diarrhea, you may relieve the irritation  using: ? Wipes with medicine in them, such as adult wet wipes with aloe and vitamin E. ? A product to soothe skin, such as petroleum jelly.  If you vomit while drinking the bowel prep: ? Take a break for up to 60 minutes. ? Begin the bowel prep again. ? Call your health care provider if you keep vomiting or you cannot take the bowel prep without vomiting.  To clean out your colon, you may also be given: ? Laxative medicines. These help you have a bowel movement. ? Instructions for enema use. An enema is liquid medicine injected into your rectum. Medicines Ask your health care provider about:  Changing or stopping your regular medicines or supplements. This is especially important if you are taking iron supplements, diabetes medicines, or blood thinners.  Taking medicines such as aspirin and ibuprofen. These medicines can thin your blood. Do not take these medicines unless your health care provider tells you to take them.  Taking over-the-counter medicines, vitamins, herbs, and supplements. General instructions  Ask your health care provider what steps will be taken to help prevent infection. These may include washing skin with a germ-killing soap.  Plan to have someone take you home from the hospital or clinic. What happens during the procedure?   An IV will be inserted into one of your veins.  You may be given one or more of the following: ? A medicine to help you relax (sedative). ? A medicine to numb the area (local anesthetic). ? A medicine to make you fall asleep (general anesthetic). This is rarely needed.  You will lie on your side with your knees bent.  The tube will: ? Have oil or gel put on it (be lubricated). ? Be inserted into your anus. ? Be gently eased through all parts of your large intestine.  Air will be sent into your colon to keep it open. This may cause some pressure or cramping.  Images will be taken with the camera and will appear on a screen.  A  small tissue sample may be removed to be looked at under a microscope (biopsy). The tissue may be sent to a lab for testing if any signs of problems are found.  If small polyps are found, they may be removed and checked for cancer cells.  When the procedure is finished, the tube will be removed. The procedure may vary among health care providers and hospitals. What happens after the procedure?  Your blood pressure, heart rate, breathing rate, and blood oxygen level will be monitored until you leave the hospital or clinic.  You may have a small amount of blood in your stool.  You may pass gas and have mild cramping or bloating in your abdomen. This is caused by the air that was used to open your colon during the exam.  Do not drive for 24 hours after the procedure.  It is up to you to get the results of your procedure. Ask  your health care provider, or the department that is doing the procedure, when your results will be ready. Summary  A colonoscopy is a procedure to look at the entire large intestine.  Follow instructions from your health care provider about eating and drinking before the procedure.  If you were prescribed an oral bowel prep to clean out your colon, take it as told by your health care provider.  During the colonoscopy, a flexible tube with a camera on its end is inserted into the anus and then passed into the other parts of the large intestine. This information is not intended to replace advice given to you by your health care provider. Make sure you discuss any questions you have with your health care provider. Document Revised: 02/22/2019 Document Reviewed: 02/22/2019 Elsevier Patient Education  [place].

## 2020-02-18 ENCOUNTER — Encounter (HOSPITAL_COMMUNITY): Payer: Self-pay | Admitting: Internal Medicine

## 2020-02-18 ENCOUNTER — Ambulatory Visit (HOSPITAL_COMMUNITY): Payer: 59 | Admitting: Anesthesiology

## 2020-02-18 ENCOUNTER — Ambulatory Visit (HOSPITAL_COMMUNITY)
Admission: RE | Admit: 2020-02-18 | Discharge: 2020-02-18 | Disposition: A | Discharge status: Home / Self Care | Payer: 59 | Attending: Internal Medicine | Admitting: Internal Medicine

## 2020-02-18 ENCOUNTER — Encounter (HOSPITAL_COMMUNITY)
Admission: RE | Disposition: A | Discharge status: Home / Self Care | Payer: Self-pay | Source: Home / Self Care | Attending: Internal Medicine

## 2020-02-18 ENCOUNTER — Telehealth: Payer: Self-pay

## 2020-02-18 ENCOUNTER — Other Ambulatory Visit: Payer: Self-pay

## 2020-02-18 DIAGNOSIS — K5909 Other constipation: Secondary | ICD-10-CM | POA: Insufficient documentation

## 2020-02-18 DIAGNOSIS — Z85038 Personal history of other malignant neoplasm of large intestine: Secondary | ICD-10-CM

## 2020-02-18 DIAGNOSIS — K219 Gastro-esophageal reflux disease without esophagitis: Secondary | ICD-10-CM | POA: Diagnosis not present

## 2020-02-18 DIAGNOSIS — Z79890 Hormone replacement therapy (postmenopausal): Secondary | ICD-10-CM | POA: Insufficient documentation

## 2020-02-18 DIAGNOSIS — Z85048 Personal history of other malignant neoplasm of rectum, rectosigmoid junction, and anus: Secondary | ICD-10-CM | POA: Insufficient documentation

## 2020-02-18 DIAGNOSIS — K5730 Diverticulosis of large intestine without perforation or abscess without bleeding: Secondary | ICD-10-CM | POA: Diagnosis not present

## 2020-02-18 DIAGNOSIS — Z980 Intestinal bypass and anastomosis status: Secondary | ICD-10-CM | POA: Diagnosis not present

## 2020-02-18 DIAGNOSIS — Z1211 Encounter for screening for malignant neoplasm of colon: Secondary | ICD-10-CM | POA: Insufficient documentation

## 2020-02-18 DIAGNOSIS — Z79899 Other long term (current) drug therapy: Secondary | ICD-10-CM | POA: Diagnosis not present

## 2020-02-18 HISTORY — PX: COLONOSCOPY WITH PROPOFOL: SHX5780

## 2020-02-18 SURGERY — COLONOSCOPY WITH PROPOFOL
Anesthesia: General

## 2020-02-18 MED ORDER — LIDOCAINE HCL (CARDIAC) PF 50 MG/5ML IV SOSY
PREFILLED_SYRINGE | INTRAVENOUS | Status: DC | PRN
  Administered 2020-02-18: 100 mg via INTRAVENOUS

## 2020-02-18 MED ORDER — PROPOFOL 10 MG/ML IV BOLUS
INTRAVENOUS | Status: DC | PRN
  Administered 2020-02-18: 40 mg via INTRAVENOUS

## 2020-02-18 MED ORDER — PROPOFOL 500 MG/50ML IV EMUL
INTRAVENOUS | Status: DC | PRN
  Administered 2020-02-18: 200 ug/kg/min via INTRAVENOUS
  Administered 2020-02-18: 150 ug/kg/min via INTRAVENOUS

## 2020-02-18 MED ORDER — CHLORHEXIDINE GLUCONATE CLOTH 2 % EX PADS: 6.0000 | MEDICATED_PAD | Freq: Once | CUTANEOUS | Status: DC

## 2020-02-18 MED ORDER — LACTATED RINGERS IV SOLN: Freq: Once | INTRAVENOUS | Status: AC

## 2020-02-18 MED ORDER — LACTATED RINGERS IV SOLN
INTRAVENOUS | Status: DC | PRN
Start: 2020-02-18 — End: 2020-02-18

## 2020-02-18 NOTE — H&P (Signed)
@LOGO @   Primary Care Physician:  Janora Norlander, DO Primary Gastroenterologist:  Dr. Gala Romney  Pre-Procedure History & Physical: HPI:  [name] is a 54 y.o. female here for surveillance colonoscopy.  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  She is currently overdue for surveillance.  Lifelong chronic constipation on Fiberchoice and as needed MiraLAX/Linzess.  Past Medical History:  Diagnosis Date  . Anxiety    NEW-DUE TO ANXIETY OVER DX OF CANCER  . Blood transfusion 1995  . Cancer ([place]) 2014   RECTAL CANCER  . Closed fracture of left distal fibula 04/27/2018  . Constipation    SOME BLOOD IN STOOL  . GERD (gastroesophageal reflux disease)    occasionally-NO MEDS  . History of kidney stones   . Kidney stone   . PONV (postoperative nausea and vomiting)    used a scop patch last surgery-was better    Past Surgical History:  Procedure Laterality Date  . ABDOMINAL HYSTERECTOMY  1995   partial  . BACK SURGERY  2015  . COLON SURGERY    . COLONOSCOPY  06/01/2012   Procedure: COLONOSCOPY;  Surgeon: Danie Binder, MD;  Location: [facility];  Service: Endoscopy;  Laterality: N/A;  1:30PM  . COLONOSCOPY N/A 06/14/2013   AST:MHDQ diverticulosis/normal anastomosis  . endoscopic left fallopian tube removed  several yrs ago  . EUS  06/06/2012   Procedure: LOWER ENDOSCOPIC ULTRASOUND (EUS);  Surgeon: Arta Silence, MD;  Location: [facility];  Service: Endoscopy;  Laterality: N/A;  . EXAMINATION UNDER ANESTHESIA  09/07/2012   Procedure: EXAM UNDER ANESTHESIA;  Surgeon: Stark Klein, MD;  Location: [facility];  Service: General;  Laterality: N/A;  . EXCISION/RELEASE BURSA HIP  09/15/2011   Dr Tonita Cong EXCISION/RELEASE BURSA HIP;  Surgeon: Johnn Hai, MD;  Location: [facility];  Service: Orthopedics;  Laterality: Left;  Excision of Trochanteric Bursitis  . FLEXIBLE SIGMOIDOSCOPY N/A 03/22/2013   Procedure:  FLEXIBLE SIGMOIDOSCOPY;  Surgeon: Danie Binder, MD;  [facility]: [facility];  Service: Endoscopy;  Laterality: N/A;  2:00  . LAPAROSCOPIC LOW ANTERIOR RESECTION  07/02/2012   Procedure: LAPAROSCOPIC LOW ANTERIOR RESECTION;  Surgeon: Stark Klein, MD;  Location: [facility];  Service: General;  Laterality: N/A;  . left ankle surgery for fx  several yrs ago  . LUMBAR LAMINECTOMY/DECOMPRESSION MICRODISCECTOMY N/A 05/14/2014   Procedure: LUMBAR DECOMPRESSION L2-L3;  Surgeon: Johnn Hai, MD;  Location: [facility];  Service: Orthopedics;  Laterality: N/A;  . MASS EXCISION Left 02/02/2018   Procedure: EXCISION CYST, LEFT INNER THIGH;  Surgeon: Aviva Signs, MD;  Location: [facility];  Service: General;  Laterality: Left;  . PROCTOSCOPY  09/07/2012   Procedure: PROCTOSCOPY;  Surgeon: Stark Klein, MD;  Location: [facility];  Service: General;  Laterality: N/A;    Prior to Admission medications   Medication Sig Start Date End Date Taking? Authorizing Provider  cyclobenzaprine (FLEXERIL) 10 MG tablet Take 10 mg by mouth 3 (three) times daily as needed for muscle spasms.  11/30/19  Yes [provider]  esomeprazole (NEXIUM) 40 MG capsule Take 1 capsule (40 mg total) by mouth daily before breakfast. (Needs to be seen before next refill) 12/06/19  Yes Mahala Menghini, PA-C  estradiol (ESTRACE) 1 MG tablet Take 1.5 mg by mouth daily.    Yes [provider]  linaclotide (LINZESS) 290 MCG CAPS capsule Take 1 capsule (290 mcg total) by mouth daily. (Needs to  be seen before next refill) Patient taking differently: Take 290 mcg by mouth daily as needed (constipation). (Needs to be seen before next refill) Takes 2-3 x week 05/22/18  Yes Gottschalk, Ashly M, DO  oxyCODONE-acetaminophen (PERCOCET) 10-325 MG tablet Take 1 tablet by mouth every 6 (six) hours as needed for pain.   Yes [provider]    Allergies as of 12/06/2019 - Review Complete 12/06/2019  Allergen Reaction Noted   . Codeine Nausea Only 12/15/2010  . Ultram [tramadol] Nausea And Vomiting 05/25/2012  . Latex Itching and Rash 07/03/2012    Family History  Problem Relation Age of Onset  . Other Sister   . Migraines Mother   . Colon cancer Paternal Aunt     Social History   Socioeconomic History  . Marital status: Single    Spouse name: Not on file  . Number of children: 2  . Years of education: Not on file  . Highest education level: Not on file  Occupational History  . Occupation: Hotel manager    Employer: [employer]  Tobacco Use  . Smoking status: Never Smoker  . Smokeless tobacco: Never Used  Vaping Use  . Vaping Use: Never used  Substance and Sexual Activity  . Alcohol use: Yes    Comment: occasional couple times per mo  . Drug use: No  . Sexual activity: Yes    Birth control/protection: Surgical  Other Topics Concern  . Not on file  Social History Narrative   Lives alone & 2 kids         Social Determinants of Health   Financial Resource Strain:   . Difficulty of Paying Living Expenses:   Food Insecurity:   . Worried About Charity fundraiser in the Last Year:   . Arboriculturist in the Last Year:   Transportation Needs:   . Film/video editor (Medical):   Marland Kitchen Lack of Transportation (Non-Medical):   Physical Activity:   . Days of Exercise per Week:   . Minutes of Exercise per Session:   Stress:   . Feeling of Stress :   Social Connections:   . Frequency of Communication with Friends and Family:   . Frequency of Social Gatherings with Friends and Family:   . Attends Religious Services:   . Active Member of Clubs or Organizations:   . Attends Archivist Meetings:   Marland Kitchen Marital Status:   Intimate Partner Violence:   . Fear of Current or Ex-Partner:   . Emotionally Abused:   Marland Kitchen Physically Abused:   . Sexually Abused:     Review of Systems: See HPI, otherwise negative ROS  Physical Exam: BP 113/89   Pulse 84   Temp 97.8 F (36.6  C) (Oral)   Resp 17   Ht 5\' 6"  (1.676 m)   Wt 93.4 kg   SpO2 100%   BMI 33.25 kg/m  General:   Alert,  Well-developed, well-nourished, pleasant and cooperative in NAD Neck:  Supple; no masses or thyromegaly. No significant cervical adenopathy. Lungs:  Clear throughout to auscultation.   No wheezes, crackles, or rhonchi. No acute distress. Heart:  Regular rate and rhythm; no murmurs, clicks, rubs,  or gallops. Abdomen: Non-distended, normal bowel sounds.  Soft and nontender without appreciable mass or hepatosplenomegaly.  Pulses:  Normal pulses noted. Extremities:  Without clubbing or edema.  Impression/Plan: 54 year old lady with limited stage rectal cancer status post low anterior resection 2013.  Chronic constipation.  Here for surveillance  colonoscopy per plan. The risks, benefits, limitations, alternatives and imponderables have been reviewed with the patient. Questions have been answered. All parties are agreeable.      Notice: This dictation was prepared with Dragon dictation along with smaller phrase technology. Any transcriptional errors that result from this process are unintentional and may not be corrected upon review.

## 2020-02-18 NOTE — Anesthesia Procedure Notes (Signed)
Date/Time: 02/18/2020 10:31 AM Performed by: Vista Deck, CRNA Pre-anesthesia Checklist: Patient identified, Emergency Drugs available, Suction available, Timeout performed and Patient being monitored Patient Re-evaluated:Patient Re-evaluated prior to induction Oxygen Delivery Method: Non-rebreather mask

## 2020-02-18 NOTE — Telephone Encounter (Signed)
Pt called and inquired if her TCS was coded preventative. Pt says she never has had to pay a copay of $700.00 this morning prior to her procedure. Pt paid $200.00 down payment.

## 2020-02-18 NOTE — Transfer of Care (Signed)
Immediate Anesthesia Transfer of Care Note  Patient: [name]  Procedure(s) Performed: COLONOSCOPY WITH PROPOFOL (N/A )  Patient Location: PACU  Anesthesia Type:General  Level of Consciousness: awake, alert  and patient cooperative  Airway & Oxygen Therapy: Patient Spontanous Breathing  Post-op Assessment: Report [name] to RN and Post -op Vital signs reviewed and stable  Post vital signs: Reviewed and stable  Last Vitals:  Vitals Value Taken Time  BP 131/115 02/18/20 1056  Temp 97.5   Pulse 85 02/18/20 1057  Resp 21 02/18/20 1057  SpO2 99 % 02/18/20 1057  Vitals shown include unvalidated device data.  Last Pain:  Vitals:   02/18/20 1036  TempSrc:   PainSc: 0-No pain         Complications: No complications documented.

## 2020-02-18 NOTE — Anesthesia Preprocedure Evaluation (Signed)
Anesthesia Evaluation  Patient identified by MRN, date of birth, ID band Patient awake    Reviewed: Allergy & Precautions, NPO status , Patient's Chart, lab work & pertinent test results  History of Anesthesia Complications (+) PONV and history of anesthetic complications  Airway Mallampati: II  TM Distance: >3 FB Neck ROM: Full    Dental  (+) Teeth Intact, Dental Advisory Given   Pulmonary neg pulmonary ROS,    Pulmonary exam normal breath sounds clear to auscultation       Cardiovascular Exercise Tolerance: Good Normal cardiovascular exam Rhythm:Regular Rate:Normal  1. The left ventricle has normal systolic function with an ejection  fraction of 60-65%. The cavity size was normal. Left ventricular diastolic  parameters were normal. No evidence of left ventricular regional wall  motion abnormalities.  2. The right ventricle has normal systolic function. The cavity was  normal. There is no increase in right ventricular wall thickness.  3. The mitral valve is grossly normal.  4. The tricuspid valve is grossly normal.  5. The aortic valve is tricuspid. Mild thickening of the aortic valve.    Neuro/Psych  Headaches, Anxiety    GI/Hepatic GERD  Medicated and Controlled,  Endo/Other  negative endocrine ROS  Renal/GU Renal disease (stones)  negative genitourinary   Musculoskeletal negative musculoskeletal ROS (+)   Abdominal   Peds  Hematology negative hematology ROS (+)   Anesthesia Other Findings   Reproductive/Obstetrics negative OB ROS                            Anesthesia Physical Anesthesia Plan  ASA: II  Anesthesia Plan: General   Post-op Pain Management:    Induction: Intravenous  PONV Risk Score and Plan: 3 and TIVA  Airway Management Planned: Nasal Cannula, Natural Airway and Simple Face Mask  Additional Equipment:   Intra-op Plan:   Post-operative Plan:    Informed Consent: I have reviewed the patients History and Physical, chart, labs and discussed the procedure including the risks, benefits and alternatives for the proposed anesthesia with the patient or authorized representative who has indicated his/her understanding and acceptance.     Dental advisory given  Plan Discussed with: CRNA and Surgeon  Anesthesia Plan Comments:         Anesthesia Quick Evaluation

## 2020-02-18 NOTE — Discharge Instructions (Signed)
Colonoscopy Discharge Instructions  Read the instructions outlined below and refer to this sheet in the next few weeks. These discharge instructions provide you with general information on caring for yourself after you leave the hospital. Your doctor may also give you specific instructions. While your treatment has been planned according to the most current medical practices available, unavoidable complications occasionally occur. If you have any problems or questions after discharge, call Dr. Gala Romney at [phone]. ACTIVITY  You may resume your regular activity, but move at a slower pace for the next 24 hours.   Take frequent rest periods for the next 24 hours.   Walking will help get rid of the air and reduce the bloated feeling in your belly (abdomen).   No driving for 24 hours (because of the medicine (anesthesia) used during the test).    Do not sign any important legal documents or operate any machinery for 24 hours (because of the anesthesia used during the test).  NUTRITION  Drink plenty of fluids.   You may resume your normal diet as instructed by your doctor.   Begin with a light meal and progress to your normal diet. Heavy or fried foods are harder to digest and may make you feel sick to your stomach (nauseated).   Avoid alcoholic beverages for 24 hours or as instructed.  MEDICATIONS  You may resume your normal medications unless your doctor tells you otherwise.  WHAT YOU CAN EXPECT TODAY  Some feelings of bloating in the abdomen.   Passage of more gas than usual.   Spotting of blood in your stool or on the toilet paper.  IF YOU HAD POLYPS REMOVED DURING THE COLONOSCOPY:  No aspirin products for 7 days or as instructed.   No alcohol for 7 days or as instructed.   Eat a soft diet for the next 24 hours.  FINDING OUT THE RESULTS OF YOUR TEST Not all test results are available during your visit. If your test results are not back during the visit, make an appointment  with your caregiver to find out the results. Do not assume everything is normal if you have not heard from your caregiver or the medical facility. It is important for you to follow up on all of your test results.  SEEK IMMEDIATE MEDICAL ATTENTION IF:  You have more than a spotting of blood in your stool.   Your belly is swollen (abdominal distention).   You are nauseated or vomiting.   You have a temperature over 101.   You have abdominal pain or discomfort that is severe or gets worse throughout the day.    Diverticulosis information provided  Constipation information provided  No sign of recurrent cancer or polyps today which is great news.  I recommend you have a repeat colonoscopy in 5 years for surveillance purposes  At patient request, I called Gerhard Perches at [phone] -rolled to voicemail stated "mailbox is full"      Diverticulosis  Diverticulosis is a condition that develops when small pouches (diverticula) form in the wall of the large intestine (colon). The colon is where water is absorbed and stool (feces) is formed. The pouches form when the inside layer of the colon pushes through weak spots in the outer layers of the colon. You may have a few pouches or many of them. The pouches usually do not cause problems unless they become inflamed or infected. When this happens, the condition is called diverticulitis. What are the causes? The cause of this condition  is not known. What increases the risk? The following factors may make you more likely to develop this condition:  Being older than age 45. Your risk for this condition increases with age. Diverticulosis is rare among people younger than age 48. By age 3, many people have it.  Eating a low-fiber diet.  Having frequent constipation.  Being overweight.  Not getting enough exercise.  Smoking.  Taking over-the-counter pain medicines, like aspirin and ibuprofen.  Having a family history of  diverticulosis. What are the signs or symptoms? In most people, there are no symptoms of this condition. If you do have symptoms, they may include:  Bloating.  Cramps in the abdomen.  Constipation or diarrhea.  Pain in the lower left side of the abdomen. How is this diagnosed? Because diverticulosis usually has no symptoms, it is most often diagnosed during an exam for other colon problems. The condition may be diagnosed by:  Using a flexible scope to examine the colon (colonoscopy).  Taking an X-ray of the colon after dye has been put into the colon (barium enema).  Having a CT scan. How is this treated? You may not need treatment for this condition. Your health care provider may recommend treatment to prevent problems. You may need treatment if you have symptoms or if you previously had diverticulitis. Treatment may include:  Eating a high-fiber diet.  Taking a fiber supplement.  Taking a live bacteria supplement (probiotic).  Taking medicine to relax your colon. Follow these instructions at home: Medicines  Take over-the-counter and prescription medicines only as told by your health care provider.  If told by your health care provider, take a fiber supplement or probiotic. Constipation prevention Your condition may cause constipation. To prevent or treat constipation, you may need to:  Drink enough fluid to keep your urine pale yellow.  Take over-the-counter or prescription medicines.  Eat foods that are high in fiber, such as beans, whole grains, and fresh fruits and vegetables.  Limit foods that are high in fat and processed sugars, such as fried or sweet foods.  General instructions  Try not to strain when you have a bowel movement.  Keep all follow-up visits as told by your health care provider. This is important. Contact a health care provider if you:  Have pain in your abdomen.  Have bloating.  Have cramps.  Have not had a bowel movement in 3  days. Get help right away if:  Your pain gets worse.  Your bloating becomes very bad.  You have a fever or chills, and your symptoms suddenly get worse.  You vomit.  You have bowel movements that are bloody or black.  You have bleeding from your rectum. Summary  Diverticulosis is a condition that develops when small pouches (diverticula) form in the wall of the large intestine (colon).  You may have a few pouches or many of them.  This condition is most often diagnosed during an exam for other colon problems.  Treatment may include increasing the fiber in your diet, taking supplements, or taking medicines. This information is not intended to replace advice given to you by your health care provider. Make sure you discuss any questions you have with your health care provider. Document Revised: 02/28/2019 Document Reviewed: 02/28/2019 Elsevier Patient Education  [place].    Constipation, Adult Constipation is when a person has fewer bowel movements in a week than normal, has difficulty having a bowel movement, or has stools that are dry, hard, or larger than  normal. Constipation may be caused by an underlying condition. It may become worse with age if a person takes certain medicines and does not take in enough fluids. Follow these instructions at home: Eating and drinking   Eat foods that have a lot of fiber, such as fresh fruits and vegetables, whole grains, and beans.  Limit foods that are high in fat, low in fiber, or overly processed, such as french fries, hamburgers, cookies, candies, and soda.  Drink enough fluid to keep your urine clear or pale yellow. General instructions  Exercise regularly or as told by your health care provider.  Go to the restroom when you have the urge to go. Do not hold it in.  Take over-the-counter and prescription medicines only as told by your health care provider. These include any fiber supplements.  Practice pelvic floor  retraining exercises, such as deep breathing while relaxing the lower abdomen and pelvic floor relaxation during bowel movements.  Watch your condition for any changes.  Keep all follow-up visits as told by your health care provider. This is important. Contact a health care provider if:  You have pain that gets worse.  You have a fever.  You do not have a bowel movement after 4 days.  You vomit.  You are not hungry.  You lose weight.  You are bleeding from the anus.  You have thin, pencil-like stools. Get help right away if:  You have a fever and your symptoms suddenly get worse.  You leak stool or have blood in your stool.  Your abdomen is bloated.  You have severe pain in your abdomen.  You feel dizzy or you faint. This information is not intended to replace advice given to you by your health care provider. Make sure you discuss any questions you have with your health care provider. Document Revised: 07/14/2017 Document Reviewed: 01/20/2016 Elsevier Patient Education  2020 [place] After These instructions provide you with information about caring for yourself after your procedure. Your health care provider may also give you more specific instructions. Your treatment has been planned according to current medical practices, but problems sometimes occur. Call your health care provider if you have any problems or questions after your procedure. What can I expect after the procedure? After your procedure, you may:  Feel sleepy for several hours.  Feel clumsy and have poor balance for several hours.  Feel forgetful about what happened after the procedure.  Have poor judgment for several hours.  Feel nauseous or vomit.  Have a sore throat if you had a breathing tube during the procedure. Follow these instructions at home: For at least 24 hours after the procedure:      Have a responsible adult stay with you. It is  important to have someone help care for you until you are awake and alert.  Rest as needed.  Do not: ? Participate in activities in which you could fall or become injured. ? Drive. ? Use heavy machinery. ? Drink alcohol. ? Take sleeping pills or medicines that cause drowsiness. ? Make important decisions or sign legal documents. ? Take care of children on your own. Eating and drinking  Follow the diet that is recommended by your health care provider.  If you vomit, drink water, juice, or soup when you can drink without vomiting.  Make sure you have little or no nausea before eating solid foods. General instructions  Take over-the-counter and prescription medicines only as told  by your health care provider.  If you have sleep apnea, surgery and certain medicines can increase your risk for breathing problems. Follow instructions from your health care provider about wearing your sleep device: ? Anytime you are sleeping, including during daytime naps. ? While taking prescription pain medicines, sleeping medicines, or medicines that make you drowsy.  If you smoke, do not smoke without supervision.  Keep all follow-up visits as told by your health care provider. This is important. Contact a health care provider if:  You keep feeling nauseous or you keep vomiting.  You feel light-headed.  You develop a rash.  You have a fever. Get help right away if:  You have trouble breathing. Summary  For several hours after your procedure, you may feel sleepy and have poor judgment.  Have a responsible adult stay with you for at least 24 hours or until you are awake and alert. This information is not intended to replace advice given to you by your health care provider. Make sure you discuss any questions you have with your health care provider. Document Revised: 10/30/2017 Document Reviewed: 11/22/2015 Elsevier Patient Education  [place].

## 2020-02-18 NOTE — Telephone Encounter (Signed)
Have management review

## 2020-02-18 NOTE — Op Note (Signed)
[facility] Patient Name: [name] Procedure Date: 02/18/2020 10:23 AM MRN: [id number] Date of Birth: October 05, 1965 Attending MD: Norvel Richards , MD CSN: [id number] Age: 54 Admit Type: Outpatient Procedure:                Colonoscopy Indications:              High risk colon cancer surveillance: Personal                            history of colon cancer Providers:                Norvel Richards, MD, Janeece Riggers, RN, Nelma Rothman, Technician Referring MD:              Medicines:                Propofol per Anesthesia Complications:            No immediate complications. Estimated Blood Loss:     Estimated blood loss: none. Procedure:                Pre-Anesthesia Assessment:                           - Prior to the procedure, a History and Physical                            was performed, and patient medications and                            allergies were reviewed. The patient's tolerance of                            previous anesthesia was also reviewed. The risks                            and benefits of the procedure and the sedation                            options and risks were discussed with the patient.                            All questions were answered, and informed consent                            was obtained. Prior Anticoagulants: The patient has                            taken no previous anticoagulant or antiplatelet                            agents. ASA Grade Assessment: II - A patient with  mild systemic disease. After reviewing the risks                            and benefits, the patient was deemed in                            satisfactory condition to undergo the procedure.                           After obtaining informed consent, the colonoscope                            was passed under direct vision. Throughout the                            procedure, the patient's blood  pressure, pulse, and                            oxygen saturations were monitored continuously. The                            CF-[id number] ([id number]) scope was introduced through                            the anus and advanced to the the cecum, identified                            by appendiceal orifice and ileocecal valve. The                            colonoscopy was performed without difficulty. The                            patient tolerated the procedure well. The quality                            of the bowel preparation was adequate. Scope In: 10:39:44 AM Scope Out: 10:49:29 AM Scope Withdrawal Time: 0 hours 6 minutes 21 seconds  Total Procedure Duration: 0 hours 9 minutes 45 seconds  Findings:      The perianal and digital rectal examinations were normal. Surgical       anastomosis readily identified at 2 cm in from the anal verge. It       appeared normal. Very minimal rectum left. No retroflexion performed.      A few medium-mouthed diverticula were found in the entire colon.      The exam was otherwise without abnormality. Impression:               - Diverticulosis in the entire examined colon.                            Surgical anastomosis at 2 cm from the anal verge                           - The examination  was otherwise normal.                           - No specimens collected. Moderate Sedation:      Moderate (conscious) sedation was personally administered by an       anesthesia professional. The following parameters were monitored: oxygen       saturation, heart rate, blood pressure, respiratory rate, EKG, adequacy       of pulmonary ventilation, and response to care. Recommendation:           - Patient has a contact number available for                            emergencies. The signs and symptoms of potential                            delayed complications were discussed with the                            patient. Return to normal activities tomorrow.                             Written discharge instructions were provided to the                            patient.                           - Advance diet as tolerated. Repeat colonoscopy in                            5 years Procedure Code(s):        --- Professional ---                           [phone], Colonoscopy, flexible; diagnostic, including                            collection of specimen(s) by brushing or washing,                            when performed (separate procedure) Diagnosis Code(s):        --- Professional ---                           P37.902, Personal history of other malignant                            neoplasm of large intestine                           K57.30, Diverticulosis of large intestine without                            perforation or abscess without bleeding CPT copyright 2019 American Medical Association. All rights reserved. The codes documented in this report are preliminary and  upon coder review may  be revised to meet current compliance requirements. Cristopher Estimable. Krystol Rocco, MD Norvel Richards, MD 02/18/2020 10:56:56 AM This report has been signed electronically. Number of Addenda: 0

## 2020-02-18 NOTE — Telephone Encounter (Signed)
Noted  

## 2020-02-18 NOTE — Anesthesia Postprocedure Evaluation (Signed)
Anesthesia Post Note  Patient: [name]  Procedure(s) Performed: COLONOSCOPY WITH PROPOFOL (N/A )  Patient location during evaluation: PACU Anesthesia Type: General Level of consciousness: awake and alert and patient cooperative Pain management: satisfactory to patient Vital Signs Assessment: post-procedure vital signs reviewed and stable Respiratory status: spontaneous breathing Cardiovascular status: stable Postop Assessment: no apparent nausea or vomiting Anesthetic complications: no   No complications documented.   Last Vitals:  Vitals:   02/18/20 0844 02/18/20 1100  BP: 113/89 114/73  Pulse: 84 78  Resp: 17 13  Temp: 36.6 C (!) 36.4 C  SpO2: 100% 100%    Last Pain:  Vitals:   02/18/20 1100  TempSrc:   PainSc: 0-No pain                 Hilarie Sinha

## 2020-02-19 NOTE — Progress Notes (Signed)
Follow up call pt stated that she has mild cramping. She feels that it may be gas. Educated her to try things otc for gas at home but if the pain or cramping got any worse then to call us back at the surgery center or to call Dr. Coralee North office. T.Omarion Minnehan

## 2020-02-19 NOTE — Telephone Encounter (Signed)
I tried to call the patient and left her a message to call me if she had questions regarding her bill or EOB once she receives them.

## 2020-02-21 ENCOUNTER — Telehealth: Payer: Self-pay | Admitting: Family Medicine

## 2020-02-21 NOTE — Telephone Encounter (Signed)
If she is experiencing pain, I would call GI and seek immediate medical attention in ED.  Need to make sure no complications from procedure.

## 2020-02-21 NOTE — Telephone Encounter (Signed)
Please offer suggestions.

## 2020-02-21 NOTE — Telephone Encounter (Signed)
Pt called requesting to speak with either Dr Lajuana Ripple or nurse. Says she had a colonoscopy this past Tuesday and is in a lot of pain and has not been able to have a bowel movement.

## 2020-02-21 NOTE — Telephone Encounter (Signed)
She will try calling GI and will go to ED if does not speak to GI personnel.

## 2020-02-26 ENCOUNTER — Encounter (HOSPITAL_COMMUNITY): Payer: Self-pay | Admitting: Internal Medicine

## 2020-04-01 ENCOUNTER — Telehealth: Payer: Self-pay

## 2020-04-01 ENCOUNTER — Other Ambulatory Visit: Payer: Self-pay | Admitting: Gastroenterology

## 2020-04-01 DIAGNOSIS — K5900 Constipation, unspecified: Secondary | ICD-10-CM

## 2020-04-01 MED ORDER — LINACLOTIDE 290 MCG PO CAPS: 290.0000 ug | ORAL_CAPSULE | Freq: Every day | ORAL | 5 refills | Status: DC

## 2020-04-01 NOTE — Telephone Encounter (Signed)
Pt is requesting a refill ofLinzess 290 mcg one pill 30 mins before breakfast. [facility], [place], [place].

## 2020-04-01 NOTE — Telephone Encounter (Signed)
Rx sent to pharmacy   

## 2020-04-02 NOTE — Telephone Encounter (Signed)
Noted  

## 2020-09-28 ENCOUNTER — Other Ambulatory Visit: Payer: Self-pay | Admitting: *Deleted

## 2020-10-02 ENCOUNTER — Ambulatory Visit: Payer: Self-pay | Admitting: Family

## 2020-10-05 ENCOUNTER — Ambulatory Visit: Payer: 59 | Admitting: Nurse Practitioner

## 2020-10-05 ENCOUNTER — Encounter: Payer: Self-pay | Admitting: Nurse Practitioner

## 2020-10-05 ENCOUNTER — Other Ambulatory Visit: Payer: Self-pay

## 2020-10-05 VITALS — BP 121/73 | HR 89 | Temp 97.6°F | Ht 66.0 in | Wt 213.6 lb

## 2020-10-05 DIAGNOSIS — L02818 Cutaneous abscess of other sites: Secondary | ICD-10-CM

## 2020-10-05 DIAGNOSIS — B001 Herpesviral vesicular dermatitis: Secondary | ICD-10-CM | POA: Diagnosis not present

## 2020-10-05 DIAGNOSIS — L0291 Cutaneous abscess, unspecified: Secondary | ICD-10-CM | POA: Insufficient documentation

## 2020-10-05 MED ORDER — FLUCONAZOLE 150 MG PO TABS: 150.0000 mg | ORAL_TABLET | Freq: Once | ORAL | 0 refills | Status: AC

## 2020-10-05 MED ORDER — CEPHALEXIN 500 MG PO CAPS: 500.0000 mg | ORAL_CAPSULE | Freq: Four times a day (QID) | ORAL | 0 refills | Status: DC

## 2020-10-05 NOTE — Patient Instructions (Signed)
Skin Abscess  A skin abscess is an infected area of your skin that contains pus and other material. An abscess can happen in any part of your body. Some abscesses break open (rupture) on their own. Most continue to get worse unless they are treated. The infection can spread deeper into the body and into your blood, which can make you feel sick. A skin abscess is caused by germs that enter the skin through a cut or scrape. It can also be caused by blocked oil and sweat glands or infected hair follicles. This condition is usually treated by:  Draining the pus.  Taking antibiotic medicines.  Placing a warm, wet washcloth over the abscess. Follow these instructions at home: Medicines  Take over-the-counter and prescription medicines only as told by your doctor.  If you were prescribed an antibiotic medicine, take it as told by your doctor. Do not stop taking the antibiotic even if you start to feel better.   Abscess care  If you have an abscess that has not drained, place a warm, clean, wet washcloth over the abscess several times a day. Do this as told by your doctor.  Follow instructions from your doctor about how to take care of your abscess. Make sure you: ? Cover the abscess with a bandage (dressing). ? Change your bandage or gauze as told by your doctor. ? Wash your hands with soap and water before you change the bandage or gauze. If you cannot use soap and water, use hand sanitizer.  Check your abscess every day for signs that the infection is getting worse. Check for: ? More redness, swelling, or pain. ? More fluid or blood. ? Warmth. ? More pus or a bad smell.   General instructions  To avoid spreading the infection: ? Do not share personal care items, towels, or hot tubs with others. ? Avoid making skin-to-skin contact with other people.  Keep all follow-up visits as told by your doctor. This is important. Contact a doctor if:  You have more redness, swelling, or pain  around your abscess.  You have more fluid or blood coming from your abscess.  Your abscess feels warm when you touch it.  You have more pus or a bad smell coming from your abscess.  You have a fever.  Your muscles ache.  You have chills.  You feel sick. Get help right away if:  You have very bad (severe) pain.  You see red streaks on your skin spreading away from the abscess. Summary  A skin abscess is an infected area of your skin that contains pus and other material.  The abscess is caused by germs that enter the skin through a cut or scrape. It can also be caused by blocked oil and sweat glands or infected hair follicles.  Follow your doctor's instructions on caring for your abscess, taking medicines, preventing infections, and keeping follow-up visits. This information is not intended to replace advice given to you by your health care provider. Make sure you discuss any questions you have with your health care provider. Document Revised: 03/07/2019 Document Reviewed: 09/14/2017 Elsevier Patient Education  2021 [employer].

## 2020-10-05 NOTE — Assessment & Plan Note (Signed)
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  Symptoms not well controlled in the last 2 weeks.  Patient is not reporting pain, fever, or itching.  Patient has tried warm compress with no therapeutic effect.  Started patient on Keflex for 10 days, education provided with printed handouts given.  Follow-up with unresolved symptoms.   Rx sent to pharmacy

## 2020-10-05 NOTE — Assessment & Plan Note (Signed)
Fever blister left lower lip.  This is recurrent for patient in the last 2 weeks.  Patient is not reporting any fever.  Symptoms are gradually resolving.  Patient is applying antiviral topical ointment and has since significant improvement.   No new medication ordered.  Patient will continue to watch and wait for resolution and will follow up with worsening symptoms.

## 2020-10-05 NOTE — Progress Notes (Signed)
Acute Office Visit  Subjective:    Patient ID: [name], female    DOB: [birth year], 55 y.o.   MRN: [id number]  Chief Complaint  Patient presents with  . fever blister  . Recurrent Skin Infections    HPI Patient is in today for Abscess: Patient presents for evaluation of a cutaneous abscess. Lesion is located in the right, left inguinal region. Onset was a few weeks ago. Symptoms have gradually worsened. Abscess has associated symptoms of pain. Patient does have previous history of cutaneous abscesses. Patient does not have diabetes.    Past Medical History:  Diagnosis Date  . Anxiety    NEW-DUE TO ANXIETY OVER DX OF CANCER  . Blood transfusion 1995  . Cancer ([place]) 2014   RECTAL CANCER  . Closed fracture of left distal fibula 04/27/2018  . Constipation    SOME BLOOD IN STOOL  . GERD (gastroesophageal reflux disease)    occasionally-NO MEDS  . History of kidney stones   . Kidney stone   . PONV (postoperative nausea and vomiting)    used a scop patch last surgery-was better    Past Surgical History:  Procedure Laterality Date  . ABDOMINAL HYSTERECTOMY  1995   partial  . BACK SURGERY  2015  . COLON SURGERY    . COLONOSCOPY  06/01/2012   Procedure: COLONOSCOPY;  Surgeon: Danie Binder, MD;  [facility]: [facility];  Service: Endoscopy;  Laterality: N/A;  1:30PM  . COLONOSCOPY N/A 06/14/2013   JSH:FWYO diverticulosis/normal anastomosis  . COLONOSCOPY WITH PROPOFOL N/A 02/18/2020   Procedure: COLONOSCOPY WITH PROPOFOL;  Surgeon: Daneil Dolin, MD;  Location: [facility];  Service: Endoscopy;  Laterality: N/A;  10:00am  . endoscopic left fallopian tube removed  several yrs ago  . EUS  06/06/2012   Procedure: LOWER ENDOSCOPIC ULTRASOUND (EUS);  Surgeon: Arta Silence, MD;  Location: [facility];  Service: Endoscopy;  Laterality: N/A;  . EXAMINATION UNDER ANESTHESIA  09/07/2012   Procedure: EXAM UNDER ANESTHESIA;  Surgeon: Stark Klein, MD;  Location:  [facility] [facility];  Service: General;  Laterality: N/A;  . EXCISION/RELEASE BURSA HIP  09/15/2011   Dr Tonita Cong EXCISION/RELEASE BURSA HIP;  Surgeon: Johnn Hai, MD;  Location: [facility];  Service: Orthopedics;  Laterality: Left;  Excision of Trochanteric Bursitis  . FLEXIBLE SIGMOIDOSCOPY N/A 03/22/2013   Procedure: FLEXIBLE SIGMOIDOSCOPY;  Surgeon: Danie Binder, MD;  [facility];  Service: Endoscopy;  Laterality: N/A;  2:00  . LAPAROSCOPIC LOW ANTERIOR RESECTION  07/02/2012   Procedure: LAPAROSCOPIC LOW ANTERIOR RESECTION;  Surgeon: Stark Klein, MD;  Location: [facility];  Service: General;  Laterality: N/A;  . left ankle surgery for fx  several yrs ago  . LUMBAR LAMINECTOMY/DECOMPRESSION MICRODISCECTOMY N/A 05/14/2014   Procedure: LUMBAR DECOMPRESSION L2-L3;  Surgeon: Johnn Hai, MD;  Location: [facility];  Service: Orthopedics;  Laterality: N/A;  . MASS EXCISION Left 02/02/2018   Procedure: EXCISION CYST, LEFT INNER THIGH;  Surgeon: Aviva Signs, MD;  Location: [facility];  Service: General;  Laterality: Left;  . PROCTOSCOPY  09/07/2012   Procedure: PROCTOSCOPY;  Surgeon: Stark Klein, MD;  Location: [facility];  Service: General;  Laterality: N/A;    Family History  Problem Relation Age of Onset  . Other Sister   . Migraines Mother   . Colon cancer Paternal Aunt     Social History   Socioeconomic History  . Marital status: Single    Spouse name:  Not on file  . Number of children: 2  . Years of education: Not on file  . Highest education level: Not on file  Occupational History  . Occupation: Hotel manager    Employer: [employer]  Tobacco Use  . Smoking status: Never Smoker  . Smokeless tobacco: Never Used  Vaping Use  . Vaping Use: Never used  Substance and Sexual Activity  . Alcohol use: Yes    Comment: occasional couple times per mo  . Drug use: No  . Sexual activity: Yes    Birth control/protection: Surgical  Other Topics  Concern  . Not on file  Social History Narrative   Lives alone & 2 kids         Social Determinants of Health   Financial Resource Strain: Not on file  Food Insecurity: Not on file  Transportation Needs: Not on file  Physical Activity: Not on file  Stress: Not on file  Social Connections: Not on file  Intimate Partner Violence: Not on file    Outpatient Medications Prior to Visit  Medication Sig Dispense Refill  . cyclobenzaprine (FLEXERIL) 10 MG tablet Take 10 mg by mouth 3 (three) times daily as needed for muscle spasms.     Marland Kitchen esomeprazole (NEXIUM) 40 MG capsule Take 1 capsule (40 mg total) by mouth daily before breakfast. (Needs to be seen before next refill) 90 capsule 3  . estradiol (ESTRACE) 1 MG tablet Take 1.5 mg by mouth daily.     Marland Kitchen linaclotide (LINZESS) 290 MCG CAPS capsule Take 1 capsule (290 mcg total) by mouth daily before breakfast. (Needs to be seen before next refill) 30 capsule 5  . oxyCODONE-acetaminophen (PERCOCET) 10-325 MG tablet Take 1 tablet by mouth every 6 (six) hours as needed for pain.     Facility-Administered Medications Prior to Visit  Medication Dose Route Frequency Provider Last Rate Last Admin  . gadopentetate dimeglumine (MAGNEVIST) injection 20 mL  20 mL Intravenous Once PRN Melvenia Beam, MD        Allergies  Allergen Reactions  . Codeine Nausea Only    Confirmed intolerance of codeine verbally with pt in PACU, pt states does not cause any SOB or dyspnea, some nausea only (MN-RN)  . Ultram [Tramadol] Nausea And Vomiting    Ultram allergy also discussed, active N&V occurs with Ultram  . Latex Itching and Rash    Review of Systems  Constitutional: Negative.   HENT: Negative.   Eyes: Negative.   Respiratory: Negative.   Cardiovascular: Negative.   Skin: Positive for color change and rash.       Abscess  Neurological: Negative.   Psychiatric/Behavioral: Negative.   All other systems reviewed and are negative.      Objective:     Physical Exam Vitals reviewed.  Constitutional:      Appearance: Normal appearance.  HENT:     Head: Normocephalic.     Nose: Nose normal.  Eyes:     Conjunctiva/sclera: Conjunctivae normal.  Cardiovascular:     Pulses: Normal pulses.     Heart sounds: Normal heart sounds.  Pulmonary:     Effort: Pulmonary effort is normal.     Breath sounds: Normal breath sounds.  Abdominal:     General: Bowel sounds are normal.  Musculoskeletal:        General: Normal range of motion.  Skin:    Findings: Erythema present.     Comments: Cutaneous abscess  Neurological:     Mental Status: She  is alert and oriented to person, place, and time.  Psychiatric:        Behavior: Behavior normal.     BP 121/73   Pulse 89   Temp 97.6 F (36.4 C)   Ht 5' 6"  (1.676 m)   Wt 213 lb 9.6 oz (96.9 kg)   SpO2 98%   BMI 34.48 kg/m  Wt Readings from Last 3 Encounters:  10/05/20 213 lb 9.6 oz (96.9 kg)  02/18/20 206 lb (93.4 kg)  12/06/19 212 lb 6.4 oz (96.3 kg)    Health Maintenance Due  Topic Date Due  . Hepatitis C Screening  Never done  . HIV Screening  Never done  . TETANUS/TDAP  Never done  . INFLUENZA VACCINE  03/15/2020    There are no preventive care reminders to display for this patient.   Lab Results  Component Value Date   TSH 3.496 12/12/2018   Lab Results  Component Value Date   WBC 8.1 12/12/2018   HGB 13.8 12/12/2018   HCT 43.8 12/12/2018   MCV 94.0 12/12/2018   PLT 245 12/12/2018   Lab Results  Component Value Date   NA 141 12/12/2018   K 3.5 12/12/2018   CHLORIDE 106 12/30/2016   CO2 25 12/12/2018   GLUCOSE 71 12/12/2018   BUN 15 12/12/2018   CREATININE 0.78 12/12/2018   BILITOT <0.2 08/10/2017   ALKPHOS 62 08/10/2017   AST 15 08/10/2017   ALT 13 08/10/2017   PROT 7.2 08/10/2017   ALBUMIN 4.4 08/10/2017   CALCIUM 9.0 12/12/2018   ANIONGAP 9 12/12/2018   EGFR >90 12/30/2016       Assessment & Plan:   Problem List Items Addressed This Visit       Digestive   Fever blister    Fever blister left lower lip.  This is recurrent for patient in the last 2 weeks.  Patient is not reporting any fever.  Symptoms are gradually resolving.  Patient is applying antiviral topical ointment and has since significant improvement.   No new medication ordered.  Patient will continue to watch and wait for resolution and will follow up with worsening symptoms.        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  Symptoms not well controlled in the last 2 weeks.  Patient is not reporting pain, fever, or itching.  Patient has tried warm compress with no therapeutic effect.  Started patient on Keflex for 10 days, education provided with printed handouts given.  Follow-up with unresolved symptoms.   Rx sent to pharmacy      Relevant Medications   cephALEXin (KEFLEX) 500 MG capsule   fluconazole (DIFLUCAN) 150 MG tablet       Meds ordered this encounter  Medications  . cephALEXin (KEFLEX) 500 MG capsule    Sig: Take 1 capsule (500 mg total) by mouth 4 (four) times daily.    Dispense:  40 capsule    Refill:  0    Order Specific Question:   Supervising Provider    Answer:   Janora Norlander [[id number]]  . fluconazole (DIFLUCAN) 150 MG tablet    Sig: Take 1 tablet (150 mg total) by mouth once for 1 dose.    Dispense:  1 tablet  Refill:  0    Order Specific Question:   Supervising Provider    Answer:   Janora Norlander [[id number]]     Ivy Lynn, NP

## 2020-12-19 ENCOUNTER — Other Ambulatory Visit: Payer: Self-pay | Admitting: Gastroenterology

## 2020-12-21 ENCOUNTER — Encounter: Payer: Self-pay | Admitting: Nurse Practitioner

## 2020-12-21 ENCOUNTER — Other Ambulatory Visit: Payer: Self-pay

## 2020-12-21 ENCOUNTER — Ambulatory Visit: Payer: 59 | Admitting: Nurse Practitioner

## 2020-12-21 VITALS — BP 122/86 | HR 87 | Temp 97.0°F | Ht 66.0 in | Wt 209.0 lb

## 2020-12-21 DIAGNOSIS — N898 Other specified noninflammatory disorders of vagina: Secondary | ICD-10-CM | POA: Diagnosis not present

## 2020-12-21 DIAGNOSIS — N760 Acute vaginitis: Secondary | ICD-10-CM

## 2020-12-21 DIAGNOSIS — B9689 Other specified bacterial agents as the cause of diseases classified elsewhere: Secondary | ICD-10-CM

## 2020-12-21 LAB — WET PREP FOR TRICH, YEAST, CLUE
Clue Cell Exam: POSITIVE — AB
Trichomonas Exam: NEGATIVE
Yeast Exam: NEGATIVE

## 2020-12-21 MED ORDER — METRONIDAZOLE 500 MG PO TABS: 500.0000 mg | ORAL_TABLET | Freq: Two times a day (BID) | ORAL | 0 refills | Status: DC

## 2020-12-21 NOTE — Assessment & Plan Note (Signed)
Completed wet prep patient positive for BV.  Started patient on metronidazole 500 mg tablet twice daily for 7 days.  Education provided to patient with printed handouts given.  Rx sent to pharmacy.  Follow-up with worsening or unresolved symptoms.

## 2020-12-21 NOTE — Progress Notes (Signed)
Acute Office Visit  Subjective:    Patient ID: [name], female    DOB: 07-Aug-1966, 55 y.o.   MRN: [id number]  Chief Complaint  Patient presents with  . Vaginal Itching    Vaginal Itching The patient's primary symptoms include genital itching, a genital odor and vaginal discharge. The patient's pertinent negatives include no pelvic pain. This is a recurrent problem. The current episode started in the past 7 days. The problem occurs constantly. The problem has been rapidly worsening. The patient is experiencing no pain. The problem affects both sides. She is not pregnant. Pertinent negatives include no back pain, chills, constipation, dysuria, fever, flank pain, frequency or sore throat. The vaginal discharge was normal. There has been no bleeding. Treatments tried: Diflucan. The treatment provided no relief. It is unknown whether or not her partner has an STD.     Past Medical History:  Diagnosis Date  . Anxiety    NEW-DUE TO ANXIETY OVER DX OF CANCER  . Blood transfusion 1995  . 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 Closed fracture of left distal fibula 04/27/2018  . Constipation    SOME BLOOD IN STOOL  . GERD (gastroesophageal reflux disease)    occasionally-NO MEDS  . History of kidney stones   . Kidney stone   . PONV (postoperative nausea and vomiting)    used a scop patch last surgery-was better    Past Surgical History:  Procedure Laterality Date  . ABDOMINAL HYSTERECTOMY  1995   partial  . BACK SURGERY  2015  . COLON SURGERY    . COLONOSCOPY  06/01/2012   Procedure: COLONOSCOPY;  Surgeon: Danie Binder, MD;  [facility];  Service: Endoscopy;  Laterality: N/A;  1:30PM  . COLONOSCOPY N/A 06/14/2013   WUJ:WJXB diverticulosis/normal anastomosis  . COLONOSCOPY WITH PROPOFOL N/A 02/18/2020   Procedure: COLONOSCOPY WITH PROPOFOL;  Surgeon: Daneil Dolin, MD;  [facility];  Service: Endoscopy;  Laterality: N/A;  10:00am  .  endoscopic left fallopian tube removed  several yrs ago  . EUS  06/06/2012   Procedure: LOWER ENDOSCOPIC ULTRASOUND (EUS);  Surgeon: Arta Silence, MD;  Location: [facility];  Service: Endoscopy;  Laterality: N/A;  . EXAMINATION UNDER ANESTHESIA  09/07/2012   Procedure: EXAM UNDER ANESTHESIA;  Surgeon: Stark Klein, MD;  Location: [place];  Service: General;  Laterality: N/A;  . EXCISION/RELEASE BURSA HIP  09/15/2011   Dr Tonita Cong EXCISION/RELEASE BURSA HIP;  Surgeon: Johnn Hai, MD;  Location: [facility];  Service: Orthopedics;  Laterality: Left;  Excision of Trochanteric Bursitis  . FLEXIBLE SIGMOIDOSCOPY N/A 03/22/2013   Procedure: FLEXIBLE SIGMOIDOSCOPY;  Surgeon: Danie Binder, MD;  [facility];  Service: Endoscopy;  Laterality: N/A;  2:00  . LAPAROSCOPIC LOW ANTERIOR RESECTION  07/02/2012   Procedure: LAPAROSCOPIC LOW ANTERIOR RESECTION;  Surgeon: Stark Klein, MD;  Location: [facility];  Service: General;  Laterality: N/A;  . left ankle surgery for fx  several yrs ago  . LUMBAR LAMINECTOMY/DECOMPRESSION MICRODISCECTOMY N/A 05/14/2014   Procedure: LUMBAR DECOMPRESSION L2-L3;  Surgeon: Johnn Hai, MD;  Location: [facility];  Service: Orthopedics;  Laterality: N/A;  . MASS EXCISION Left 02/02/2018   Procedure: EXCISION CYST, LEFT INNER THIGH;  Surgeon: Aviva Signs, MD;  Location: [facility];  Service: General;  Laterality: Left;  . PROCTOSCOPY  09/07/2012   Procedure: PROCTOSCOPY;  Surgeon: Stark Klein, MD;  Location: [place];  Service: General;  Laterality: N/A;    Family History  Problem Relation Age of Onset  . Other Sister   . Migraines Mother   . Colon cancer Paternal Aunt     Social History   Socioeconomic History  . Marital status: Single    Spouse name: Not on file  . Number of children: 2  . Years of education: Not on file  . Highest education level: Not on file  Occupational History  . Occupation: Hotel manager    Employer:  [employer]  Tobacco Use  . Smoking status: Never Smoker  . Smokeless tobacco: Never Used  Vaping Use  . Vaping Use: Never used  Substance and Sexual Activity  . Alcohol use: Yes    Comment: occasional couple times per mo  . Drug use: No  . Sexual activity: Yes    Birth control/protection: Surgical  Other Topics Concern  . Not on file  Social History Narrative   Lives alone & 2 kids         Social Determinants of Health   Financial Resource Strain: Not on file  Food Insecurity: Not on file  Transportation Needs: Not on file  Physical Activity: Not on file  Stress: Not on file  Social Connections: Not on file  Intimate Partner Violence: Not on file    Outpatient Medications Prior to Visit  Medication Sig Dispense Refill  . cyclobenzaprine (FLEXERIL) 10 MG tablet Take 10 mg by mouth 3 (three) times daily as needed for muscle spasms.     Marland Kitchen esomeprazole (NEXIUM) 40 MG capsule Take 1 capsule (40 mg total) by mouth daily before breakfast. (Needs to be seen before next refill) 90 capsule 3  . estradiol (ESTRACE) 1 MG tablet Take 1.5 mg by mouth daily.     Marland Kitchen linaclotide (LINZESS) 290 MCG CAPS capsule Take 1 capsule (290 mcg total) by mouth daily before breakfast. (Needs to be seen before next refill) 30 capsule 5  . oxyCODONE-acetaminophen (PERCOCET) 10-325 MG tablet Take 1 tablet by mouth every 6 (six) hours as needed for pain.    . cephALEXin (KEFLEX) 500 MG capsule Take 1 capsule (500 mg total) by mouth 4 (four) times daily. 40 capsule 0   Facility-Administered Medications Prior to Visit  Medication Dose Route Frequency Provider Last Rate Last Admin  . gadopentetate dimeglumine (MAGNEVIST) injection 20 mL  20 mL Intravenous Once PRN Melvenia Beam, MD        Allergies  Allergen Reactions  . Codeine Nausea Only    Confirmed intolerance of codeine verbally with pt in PACU, pt states does not cause any SOB or dyspnea, some nausea only (MN-RN)  . Ultram [Tramadol]  Nausea And Vomiting    Ultram allergy also discussed, active N&V occurs with Ultram  . Latex Itching and Rash    Review of Systems  Constitutional: Negative for chills and fever.  HENT: Negative for sore throat.   Gastrointestinal: Negative for constipation.  Genitourinary: Positive for vaginal discharge. Negative for dysuria, flank pain, frequency and pelvic pain.  Musculoskeletal: Negative for back pain.  All other systems reviewed and are negative.      Objective:    Physical Exam Vitals and nursing note reviewed.  Constitutional:      Appearance: Normal appearance.  HENT:     Head: Normocephalic.  Eyes:     Conjunctiva/sclera: Conjunctivae normal.  Cardiovascular:     Rate and Rhythm: Normal rate and regular rhythm.     Pulses: Normal pulses.  Heart sounds: Normal heart sounds.  Pulmonary:     Effort: Pulmonary effort is normal.     Breath sounds: Normal breath sounds.  Abdominal:     General: Bowel sounds are normal.  Skin:    Findings: No rash.  Neurological:     General: No focal deficit present.     Mental Status: She is alert.  Psychiatric:        Behavior: Behavior normal.     BP 122/86   Pulse 87   Temp (!) 97 F (36.1 C) (Temporal)   Ht 5' 6"  (1.676 m)   Wt 209 lb (94.8 kg)   BMI 33.73 kg/m  Wt Readings from Last 3 Encounters:  12/21/20 209 lb (94.8 kg)  10/05/20 213 lb 9.6 oz (96.9 kg)  02/18/20 206 lb (93.4 kg)    Health Maintenance Due  Topic Date Due  . HIV Screening  Never done  . Hepatitis C Screening  Never done  . TETANUS/TDAP  Never done    There are no preventive care reminders to display for this patient.   Lab Results  Component Value Date   TSH 3.496 12/12/2018   Lab Results  Component Value Date   WBC 8.1 12/12/2018   HGB 13.8 12/12/2018   HCT 43.8 12/12/2018   MCV 94.0 12/12/2018   PLT 245 12/12/2018   Lab Results  Component Value Date   NA 141 12/12/2018   K 3.5 12/12/2018   CHLORIDE 106 12/30/2016    CO2 25 12/12/2018   GLUCOSE 71 12/12/2018   BUN 15 12/12/2018   CREATININE 0.78 12/12/2018   BILITOT <0.2 08/10/2017   ALKPHOS 62 08/10/2017   AST 15 08/10/2017   ALT 13 08/10/2017   PROT 7.2 08/10/2017   ALBUMIN 4.4 08/10/2017   CALCIUM 9.0 12/12/2018   ANIONGAP 9 12/12/2018   EGFR >90 12/30/2016       Assessment & Plan:   Problem List Items Addressed This Visit      Musculoskeletal and Integument   Vaginal itching - Primary   Relevant Orders   WET PREP FOR [place], YEAST, CLUE (Completed)     Genitourinary   BV (bacterial vaginosis)    Completed wet prep patient positive for BV.  Started patient on metronidazole 500 mg tablet twice daily for 7 days.  Education provided to patient with printed handouts given.  Rx sent to pharmacy.  Follow-up with worsening or unresolved symptoms.      Relevant Medications   metroNIDAZOLE (FLAGYL) 500 MG tablet       Meds ordered this encounter  Medications  . metroNIDAZOLE (FLAGYL) 500 MG tablet    Sig: Take 1 tablet (500 mg total) by mouth 2 (two) times daily.    Dispense:  14 tablet    Refill:  0    Order Specific Question:   Supervising Provider    Answer:   Janora Norlander [[id number]]     Ivy Lynn, NP

## 2020-12-21 NOTE — Patient Instructions (Signed)
Bacterial Vaginosis  Bacterial vaginosis is an infection of the vagina. It happens when too many normal germs (healthy bacteria) grow in the vagina. This infection can make it easier to get other infections from sex (STIs). It is very important for pregnant women to get treated. This infection can cause babies to be born early or at a low birth weight. What are the causes? This infection is caused by an increase in certain germs that grow in the vagina. You cannot get this infection from toilet seats, bedsheets, swimming pools, or things that touch your vagina. What increases the risk?  Having sex with a new person or more than one person.  Having sex without protection.  Douching.  Having an intrauterine device (IUD).  Smoking.  Using drugs or drinking alcohol. These can lead you to do things that are risky.  Taking certain antibiotic medicines.  Being pregnant. What are the signs or symptoms? Some women have no symptoms. Symptoms may include:  A discharge from your vagina. It may be gray or white. It can be watery or foamy.  A fishy smell. This can happen after sex or during your menstrual period.  Itching in and around your vagina.  A feeling of burning or pain when you pee (urinate). How is this treated? This infection is treated with antibiotic medicines. These may be given to you as:  A pill.  A cream for your vagina.  A medicine that you put into your vagina (suppository). If the infection comes back after treatment, you may need more antibiotics. Follow these instructions at home: Medicines  Take over-the-counter and prescription medicines as told by your doctor.  Take or use your antibiotic medicine as told by your doctor. Do not stop taking or using it, even if you start to feel better. General instructions  If the person you have sex with is a woman, tell her that you have this infection. She will need to follow up with her doctor. If you have a female  partner, he does not need to be treated.  Do not have sex until you finish treatment.  Drink enough fluid to keep your pee pale yellow.  Keep your vagina and butt clean. ? Wash the area with warm water each day. ? Wipe from front to back after you use the toilet.  If you are breastfeeding a baby, ask your doctor if you should keep doing so during treatment.  Keep all follow-up visits. How is this prevented? Self-care  Do not douche.  Use only warm water to wash around your vagina.  Wear underwear that is cotton or lined with cotton.  Do not wear tight pants and pantyhose, especially in the summer. Safe sex  Use protection when you have sex. This includes: ? Use condoms. ? Use dental dams. This is a thin layer that protects the mouth during oral sex.  Limit how many people you have sex with. To prevent this infection, it is best to have sex with just one person.  Get tested for STIs. The person you have sex with should also get tested. Drugs and alcohol  Do not smoke or use any products that contain nicotine or tobacco. If you need help quitting, ask your doctor.  Do not use drugs.  Do not drink alcohol if: ? Your doctor tells you not to drink. ? You are pregnant, may be pregnant, or are planning to become pregnant.  If you drink alcohol: ? Limit how much you have to 0-1 drink   a day. ? Know how much alcohol is in your drink. In the [place] one drink equals one 12 oz bottle of beer (355 mL), one 5 oz glass of wine (148 mL), or one 1 oz glass of hard liquor (44 mL). Where to find more information  Centers for Disease Control and Prevention: www.cdc.gov  American Sexual Health Association: www.ashastd.org  Office on Women's Health: www.womenshealth.gov Contact a doctor if:  Your symptoms do not get better, even after you are treated.  You have more discharge or pain when you pee.  You have a fever or chills.  You have pain in your belly (abdomen) or in the area  between your hips.  You have pain with sex.  You bleed from your vagina between menstrual periods. Summary  This infection can happen when too many germs (bacteria) grow in the vagina.  This infection can make it easier to get infections from sex (STIs). Treating this can lower that chance.  Get treated if you are pregnant. This infection can cause babies to be born early.  Do not stop taking or using your antibiotic medicine, even if you start to feel better. This information is not intended to replace advice given to you by your health care provider. Make sure you discuss any questions you have with your health care provider. Document Revised: 01/30/2020 Document Reviewed: 01/30/2020 Elsevier Patient Education  2021 Elsevier Inc.  

## 2020-12-23 ENCOUNTER — Telehealth: Payer: Self-pay | Admitting: *Deleted

## 2020-12-23 NOTE — Telephone Encounter (Signed)
Patient requesting refill on Nexium 90 day supply to [facility]

## 2020-12-23 NOTE — Telephone Encounter (Signed)
noted 

## 2020-12-23 NOTE — Telephone Encounter (Signed)
Done

## 2020-12-23 NOTE — Telephone Encounter (Signed)
Patient needs ov.  

## 2020-12-31 NOTE — Telephone Encounter (Signed)
OV made and appt card mailed °

## 2021-03-04 ENCOUNTER — Encounter: Payer: Self-pay | Admitting: Family Medicine

## 2021-03-04 ENCOUNTER — Ambulatory Visit: Payer: 59 | Admitting: Family Medicine

## 2021-03-04 DIAGNOSIS — U071 COVID-19: Secondary | ICD-10-CM | POA: Diagnosis not present

## 2021-03-04 MED ORDER — DM-GUAIFENESIN ER 30-600 MG PO TB12: 1.0000 | ORAL_TABLET | Freq: Two times a day (BID) | ORAL | 0 refills | Status: DC | PRN

## 2021-03-04 MED ORDER — DEXAMETHASONE 6 MG PO TABS: 6.0000 mg | ORAL_TABLET | Freq: Two times a day (BID) | ORAL | 0 refills | Status: AC

## 2021-03-04 MED ORDER — MOLNUPIRAVIR EUA 200MG CAPSULE: 4.0000 | ORAL_CAPSULE | Freq: Two times a day (BID) | ORAL | 0 refills | Status: AC

## 2021-03-04 MED ORDER — ALBUTEROL SULFATE HFA 108 (90 BASE) MCG/ACT IN AERS: 2.0000 | INHALATION_SPRAY | Freq: Four times a day (QID) | RESPIRATORY_TRACT | 2 refills | Status: DC | PRN

## 2021-03-04 MED ORDER — BENZONATATE 100 MG PO CAPS: 100.0000 mg | ORAL_CAPSULE | Freq: Three times a day (TID) | ORAL | 0 refills | Status: DC | PRN

## 2021-03-04 NOTE — Progress Notes (Signed)
Virtual Visit  Note Due to COVID-19 pandemic this visit was conducted virtually. This visit type was conducted due to national recommendations for restrictions regarding the COVID-19 Pandemic (e.g. social distancing, sheltering in place) in an effort to limit this patient's exposure and mitigate transmission in our community. All issues noted in this document were discussed and addressed.  A physical exam was not performed with this format.  I connected with [name] on 03/04/21 at 1002 by telephone and verified that I am speaking with the correct person using two identifiers. [name] is currently located at home and no one is currently with her during the visit. The provider, Gwenlyn Perking, FNP is located in their office at time of visit.  I discussed the limitations, risks, security and privacy concerns of performing an evaluation and management service by telephone and the availability of in person appointments. I also discussed with the patient that there may be a patient responsible charge related to this service. The patient expressed understanding and agreed to proceed.  CC: Covid   History and Present Illness:  HPI [name] reports testing positive with a home Covid test yesterday. She reports symptoms x 4 days. She reports congestion, loss of taste, body aches, sore throat, dry cough, and some shortness of breath. She feels a little short of breath at all times. Denies fever or chest pain. She has been taking Theraflu for her symptoms.     ROS As per HPI.   Observations/Objective: Alert and oriented x 3. Able to speak in full sentences without difficulty.    Assessment and Plan: [name] was seen today for covid positive.  Diagnoses and all orders for this visit:  COVID-19 Molnupiravir ordered. Decadron ordered. Mucinex, nasal saline for congestion. Tessalon perles for cough. Albuterol as needed. Return to office for new or worsening symptoms, or if  symptoms persist.  -     molnupiravir EUA 200 mg CAPS; Take 4 capsules (800 mg total) by mouth 2 (two) times daily for 5 days. -     dextromethorphan-guaiFENesin (MUCINEX DM) 30-600 MG 12hr tablet; Take 1 tablet by mouth 2 (two) times daily as needed for cough. -     benzonatate (TESSALON PERLES) 100 MG capsule; Take 1 capsule (100 mg total) by mouth 3 (three) times daily as needed for cough. -     albuterol (VENTOLIN HFA) 108 (90 Base) MCG/ACT inhaler; Inhale 2 puffs into the lungs every 6 (six) hours as needed for wheezing or shortness of breath. -     dexamethasone (DECADRON) 6 MG tablet; Take 1 tablet (6 mg total) by mouth 2 (two) times daily with a meal for 5 days.    Follow Up Instructions: Return to office for new or worsening symptoms, or if symptoms persist.      I discussed the assessment and treatment plan with the patient. The patient was provided an opportunity to ask questions and all were answered. The patient agreed with the plan and demonstrated an understanding of the instructions.   The patient was advised to call back or seek an in-person evaluation if the symptoms worsen or if the condition fails to improve as anticipated.  The above assessment and management plan was discussed with the patient. The patient verbalized understanding of and has agreed to the management plan. Patient is aware to call the clinic if symptoms persist or worsen. Patient is aware when to return to the clinic for a follow-up visit. Patient educated on when it  is appropriate to go to the emergency department.   Time call ended:  1014  I provided 12 minutes of  non face-to-face time during this encounter.    Gwenlyn Perking, FNP

## 2021-03-08 ENCOUNTER — Telehealth: Payer: Self-pay | Admitting: Family Medicine

## 2021-03-08 NOTE — Telephone Encounter (Signed)
Ok to provide work note to continue quarantine until day 10. If shortness of breath has worsened, needs to be evaluated.

## 2021-03-08 NOTE — Telephone Encounter (Signed)
  Incoming Patient Call  03/08/2021  What symptoms do you have? Had televisit with tiffany 7-21 and still having coughing but not as bad, no energy, light headed and still SOB. ABX has gave her a lot of burning. Suppose to go back to work tomorrow.  How long have you been sick? Last Thursday  Have you been seen for this problem? 7-21  If your provider decides to give you a prescription, which pharmacy would you like for [facility] to be [facility] [place]   Patient informed that this information will be sent to the clinical staff for review and that they should receive a follow up call.

## 2021-03-08 NOTE — Telephone Encounter (Signed)
Note for 10 days faxed to [facility], [phone], per patient request, patient aware.  Patient will call back tomorrow and let us know how she is feeling.  If not better will make appointment for Covid clinic.

## 2021-03-27 ENCOUNTER — Other Ambulatory Visit: Payer: Self-pay | Admitting: Gastroenterology

## 2021-03-27 DIAGNOSIS — K5900 Constipation, unspecified: Secondary | ICD-10-CM

## 2021-04-02 ENCOUNTER — Other Ambulatory Visit: Payer: Self-pay | Admitting: Gastroenterology

## 2021-06-07 ENCOUNTER — Encounter: Payer: Self-pay | Admitting: Gastroenterology

## 2021-06-07 ENCOUNTER — Ambulatory Visit: Payer: 59 | Admitting: Gastroenterology

## 2021-06-23 ENCOUNTER — Other Ambulatory Visit: Payer: Self-pay | Admitting: Gastroenterology

## 2021-07-19 ENCOUNTER — Encounter: Payer: Self-pay | Admitting: Family Medicine

## 2021-07-19 ENCOUNTER — Ambulatory Visit: Payer: 59 | Admitting: Family Medicine

## 2021-07-19 VITALS — BP 122/78 | HR 86 | Temp 97.7°F | Ht 66.0 in | Wt 211.1 lb

## 2021-07-19 DIAGNOSIS — Z6834 Body mass index (BMI) 34.0-34.9, adult: Secondary | ICD-10-CM | POA: Diagnosis not present

## 2021-07-19 DIAGNOSIS — E6609 Other obesity due to excess calories: Secondary | ICD-10-CM

## 2021-07-19 DIAGNOSIS — E782 Mixed hyperlipidemia: Secondary | ICD-10-CM

## 2021-07-19 NOTE — Patient Instructions (Signed)

## 2021-07-19 NOTE — Progress Notes (Signed)
Acute Office Visit  Subjective:    Patient ID: [name], female    DOB: 1966-04-06, 55 y.o.   MRN: [id number]  Chief Complaint  Patient presents with   Hyperlipidemia    HPI Patient is in today for HLD. She had a physical with her GYN a few days ago and her cholesterol was elevated. Her total cholesterol was 240, triglycerides were 161, HLD was 51, and LDL was 160.  Her CBC, CMP, THS, and a1c were all normal. She does not have a history of DM or hypertension. She has never been a smoker.   Past Medical History:  Diagnosis Date   Anxiety    NEW-DUE TO ANXIETY OVER DX OF CANCER   Blood transfusion 1995   Cancer ([place]) 2014   RECTAL CANCER   Closed fracture of left distal fibula 04/27/2018   Constipation    SOME BLOOD IN STOOL   GERD (gastroesophageal reflux disease)    occasionally-NO MEDS   History of kidney stones    Kidney stone    PONV (postoperative nausea and vomiting)    used a scop patch last surgery-was better    Past Surgical History:  Procedure Laterality Date   ABDOMINAL HYSTERECTOMY  1995   partial   BACK SURGERY  2015   COLON SURGERY     COLONOSCOPY  06/01/2012   Procedure: COLONOSCOPY;  Surgeon: Danie Binder, MD;  [facility];  Service: Endoscopy;  Laterality: N/A;  1:30PM   COLONOSCOPY N/A 06/14/2013   YOM:AYOK diverticulosis/normal anastomosis   COLONOSCOPY WITH PROPOFOL N/A 02/18/2020   Procedure: COLONOSCOPY WITH PROPOFOL;  Surgeon: Daneil Dolin, MD;  [facility];  Service: Endoscopy;  Laterality: N/A;  10:00am   endoscopic left fallopian tube removed  several yrs ago   EUS  06/06/2012   Procedure: LOWER ENDOSCOPIC ULTRASOUND (EUS);  Surgeon: Arta Silence, MD;  Location: [facility];  Service: Endoscopy;  Laterality: N/A;   EXAMINATION UNDER ANESTHESIA  09/07/2012   Procedure: EXAM UNDER ANESTHESIA;  Surgeon: Stark Klein, MD;  Location: [facility];  Service: General;  Laterality: N/A;    EXCISION/RELEASE BURSA HIP  09/15/2011   Dr Tonita Cong EXCISION/RELEASE BURSA HIP;  Surgeon: Johnn Hai, MD;  [facility];  Service: Orthopedics;  Laterality: Left;  Excision of Trochanteric Bursitis   FLEXIBLE SIGMOIDOSCOPY N/A 03/22/2013   Procedure: FLEXIBLE SIGMOIDOSCOPY;  Surgeon: Danie Binder, MD;  [facility];  Service: Endoscopy;  Laterality: N/A;  2:00   LAPAROSCOPIC LOW ANTERIOR RESECTION  07/02/2012   Procedure: LAPAROSCOPIC LOW ANTERIOR RESECTION;  Surgeon: Stark Klein, MD;  [facility];  Service: General;  Laterality: N/A;   left ankle surgery for fx  several yrs ago   LUMBAR LAMINECTOMY/DECOMPRESSION MICRODISCECTOMY N/A 05/14/2014   Procedure: LUMBAR DECOMPRESSION L2-L3;  Surgeon: Johnn Hai, MD;  [facility];  Service: Orthopedics;  Laterality: N/A;   MASS EXCISION Left 02/02/2018   Procedure: EXCISION CYST, LEFT INNER THIGH;  Surgeon: Aviva Signs, MD;  Location: [facility];  Service: General;  Laterality: Left;   PROCTOSCOPY  09/07/2012   Procedure: PROCTOSCOPY;  Surgeon: Stark Klein, MD;  Location: [facility];  Service: General;  Laterality: N/A;    Family History  Problem Relation Age of Onset   Other Sister    Migraines Mother    Colon cancer Paternal Aunt     Social History   Socioeconomic History   Marital status: Single  Spouse name: Not on file   Number of children: 2   Years of education: Not on file   Highest education level: Not on file  Occupational History   Occupation: primary operator    Employer: [employer]  Tobacco Use   Smoking status: Never   Smokeless tobacco: Never  Vaping Use   Vaping Use: Never used  Substance and Sexual Activity   Alcohol use: Yes    Comment: occasional couple times per mo   Drug use: No   Sexual activity: Yes    Birth control/protection: Surgical  Other Topics Concern   Not on file  Social History Narrative   Lives alone & 2 kids         Social Determinants  of Health   Financial Resource Strain: Not on file  Food Insecurity: Not on file  Transportation Needs: Not on file  Physical Activity: Not on file  Stress: Not on file  Social Connections: Not on file  Intimate Partner Violence: Not on file    Outpatient Medications Prior to Visit  Medication Sig Dispense Refill   cyclobenzaprine (FLEXERIL) 10 MG tablet Take 10 mg by mouth 3 (three) times daily as needed for muscle spasms.      esomeprazole (NEXIUM) 40 MG capsule TAKE 1 CAPSULE (40 MG TOTAL) BY MOUTH DAILY BEFORE BREAKFAST. (NEEDS TO BE SEEN BEFORE NEXT REFILL) 90 capsule 3   estradiol (ESTRACE) 1 MG tablet Take 1.5 mg by mouth daily.      LINZESS 290 MCG CAPS capsule TAKE 1 CAPSULE BY MOUTH DAILY BEFORE BREAKFAST 90 capsule 3   oxyCODONE-acetaminophen (PERCOCET) 10-325 MG tablet Take 1 tablet by mouth every 6 (six) hours as needed for pain.     albuterol (VENTOLIN HFA) 108 (90 Base) MCG/ACT inhaler Inhale 2 puffs into the lungs every 6 (six) hours as needed for wheezing or shortness of breath. 8 g 2   benzonatate (TESSALON PERLES) 100 MG capsule Take 1 capsule (100 mg total) by mouth 3 (three) times daily as needed for cough. 20 capsule 0   dextromethorphan-guaiFENesin (MUCINEX DM) 30-600 MG 12hr tablet Take 1 tablet by mouth 2 (two) times daily as needed for cough. 30 tablet 0   metroNIDAZOLE (FLAGYL) 500 MG tablet Take 1 tablet (500 mg total) by mouth 2 (two) times daily. 14 tablet 0   Facility-Administered Medications Prior to Visit  Medication Dose Route Frequency Provider Last Rate Last Admin   gadopentetate dimeglumine (MAGNEVIST) injection 20 mL  20 mL Intravenous Once PRN Melvenia Beam, MD        Allergies  Allergen Reactions   Codeine Nausea Only    Confirmed intolerance of codeine verbally with pt in PACU, pt states does not cause any SOB or dyspnea, some nausea only (MN-RN)   Ultram [Tramadol] Nausea And Vomiting    Ultram allergy also discussed, active N&V occurs  with Ultram   Latex Itching and Rash    Review of Systems As per HPI.     Objective:    Physical Exam Vitals and nursing note reviewed.  Constitutional:      General: She is not in acute distress.    Appearance: She is not ill-appearing, toxic-appearing or diaphoretic.  Cardiovascular:     Rate and Rhythm: Normal rate and regular rhythm.     Heart sounds: Normal heart sounds. No murmur heard. Pulmonary:     Effort: Pulmonary effort is normal. No respiratory distress.     Breath sounds: Normal breath sounds.  No stridor.  Musculoskeletal:     Right lower leg: No edema.     Left lower leg: No edema.  Skin:    General: Skin is warm and dry.  Neurological:     General: No focal deficit present.     Mental Status: She is alert and oriented to person, place, and time.  Psychiatric:        Mood and Affect: Mood normal.        Behavior: Behavior normal.    BP 122/78   Pulse 86   Temp 97.7 F (36.5 C) (Temporal)   Ht 5' 6"  (1.676 m)   Wt 211 lb 2 oz (95.8 kg)   BMI 34.08 kg/m  Wt Readings from Last 3 Encounters:  07/19/21 211 lb 2 oz (95.8 kg)  12/21/20 209 lb (94.8 kg)  10/05/20 213 lb 9.6 oz (96.9 kg)    Health Maintenance Due  Topic Date Due   Pneumococcal Vaccine 73-29 Years old (1 - PCV) Never done   HIV Screening  Never done   Hepatitis C Screening  Never done   TETANUS/TDAP  Never done   Zoster Vaccines- Shingrix (1 of 2) Never done   COVID-19 Vaccine (4 - Booster for Pfizer series) 08/29/2020    There are no preventive care reminders to display for this patient.   Lab Results  Component Value Date   TSH 3.496 12/12/2018   Lab Results  Component Value Date   WBC 8.1 12/12/2018   HGB 13.8 12/12/2018   HCT 43.8 12/12/2018   MCV 94.0 12/12/2018   PLT 245 12/12/2018   Lab Results  Component Value Date   NA 141 12/12/2018   K 3.5 12/12/2018   CHLORIDE 106 12/30/2016   CO2 25 12/12/2018   GLUCOSE 71 12/12/2018   BUN 15 12/12/2018   CREATININE  0.78 12/12/2018   BILITOT <0.2 08/10/2017   ALKPHOS 62 08/10/2017   AST 15 08/10/2017   ALT 13 08/10/2017   PROT 7.2 08/10/2017   ALBUMIN 4.4 08/10/2017   CALCIUM 9.0 12/12/2018   ANIONGAP 9 12/12/2018   EGFR >90 12/30/2016   No results found for: CHOL No results found for: HDL No results found for: LDLCALC No results found for: TRIG No results found for: CHOLHDL No results found for: HGBA1C     Assessment & Plan:   Gerturde was seen today for hyperlipidemia.  Diagnoses and all orders for this visit:  Mixed hyperlipidemia New dx from labs completed at GYN. ASCVD risk is 2%. Discussed this with patient. Discussed diet, exercise, and weight loss in detail for treatment at this time given loss risk score.   Class 1 obesity due to excess calories with serious comorbidity and body mass index (BMI) of 34.0 to 34.9 in adult Diet and exercise discussed.   Return in about 3 months (around 10/17/2021) for with PCP for follow up.  The patient indicates understanding of these issues and agrees with the plan.  Gwenlyn Perking, FNP

## 2021-08-10 ENCOUNTER — Ambulatory Visit: Payer: 59 | Admitting: Nurse Practitioner

## 2021-08-10 ENCOUNTER — Encounter: Payer: Self-pay | Admitting: Nurse Practitioner

## 2021-08-10 VITALS — BP 127/80 | HR 90 | Temp 97.7°F | Resp 20 | Ht 66.0 in | Wt 205.0 lb

## 2021-08-10 DIAGNOSIS — F410 Panic disorder [episodic paroxysmal anxiety] without agoraphobia: Secondary | ICD-10-CM

## 2021-08-10 MED ORDER — BUSPIRONE HCL 15 MG PO TABS: 15.0000 mg | ORAL_TABLET | Freq: Two times a day (BID) | ORAL | 1 refills | Status: DC

## 2021-08-10 MED ORDER — ESCITALOPRAM OXALATE 10 MG PO TABS: 10.0000 mg | ORAL_TABLET | Freq: Every day | ORAL | 5 refills | Status: DC

## 2021-08-10 NOTE — Patient Instructions (Signed)

## 2021-08-10 NOTE — Progress Notes (Signed)
Subjective:    Patient ID: [name], female    DOB: 1965/12/17, 55 y.o.   MRN: [id number]   Chief Complaint: Panic Attack   HPI Patient had rotator cuff surgery oct 25. It tore again and had to have redone in [place]. She has been feeling panicy for several weeks. Stress at work, at home and firm surgery and she is having panic attacks. With her [place] suregry she cant do anything. She is afraid she will tear it again. She says her chest feels tight, sob and gets ill easily. GAD 7 : Generalized Anxiety Score 08/10/2021 07/19/2021  Nervous, Anxious, on Edge 3 0  Control/stop worrying 2 0  Worry too much - different things 2 0  Trouble relaxing 2 0  Restless 1 0  Easily annoyed or irritable 2 0  Afraid - awful might happen 1 0  Total GAD 7 Score 13 0  Anxiety Difficulty Somewhat difficult Not difficult at all   Depression screen [facility] 2/9 08/10/2021 07/19/2021 12/21/2020  Decreased Interest 2 0 0  Down, Depressed, Hopeless 2 0 0  PHQ - 2 Score 4 0 0  Altered sleeping 3 0 -  Tired, decreased energy 2 0 -  Change in appetite 1 0 -  Feeling bad or failure about yourself  0 0 -  Trouble concentrating 2 0 -  Moving slowly or fidgety/restless 2 0 -  Suicidal thoughts 0 0 -  PHQ-9 Score 14 0 -  Difficult doing work/chores Somewhat difficult Not difficult at all -  Some recent data might be hidden        Review of Systems  Constitutional:  Negative for diaphoresis.  Eyes:  Negative for pain.  Respiratory:  Negative for shortness of breath.   Cardiovascular:  Negative for chest pain, palpitations and leg swelling.  Gastrointestinal:  Negative for abdominal pain.  Endocrine: Negative for polydipsia.  Skin:  Negative for rash.  Neurological:  Negative for dizziness, weakness and headaches.  Hematological:  Does not bruise/bleed easily.  All other systems reviewed and are negative.     Objective:   Physical Exam Constitutional:      Appearance: Normal  appearance. She is obese.  Cardiovascular:     Rate and Rhythm: Normal rate and regular rhythm.     Heart sounds: Normal heart sounds.  Pulmonary:     Effort: Pulmonary effort is normal.     Breath sounds: Normal breath sounds.  Musculoskeletal:     Comments: Shoulder immobilizer in place.  Skin:    General: Skin is warm.  Neurological:     General: No focal deficit present.     Mental Status: She is alert and oriented to person, place, and time.  Psychiatric:        Mood and Affect: Mood is anxious.        Behavior: Behavior normal.    BP 127/80    Pulse 90    Temp 97.7 F (36.5 C) (Temporal)    Resp 20    Ht 5\' 6"  (1.676 m)    Wt 205 lb (93 kg)    SpO2 99%    BMI 33.09 kg/m        Assessment & Plan:  [name] in today with chief complaint of Panic Attack   1. Panic attacks Stress management Encouraged deep breathing exercises - escitalopram (LEXAPRO) 10 MG tablet; Take 1 tablet (10 mg total) by mouth daily.  Dispense: 30 tablet; Refill: 5 - busPIRone (BUSPAR)  15 MG tablet; Take 1 tablet (15 mg total) by mouth 2 (two) times daily.  Dispense: 30 tablet; Refill: 1    The above assessment and management plan was discussed with the patient. The patient verbalized understanding of and has agreed to the management plan. Patient is aware to call the clinic if symptoms persist or worsen. Patient is aware when to return to the clinic for a follow-up visit. Patient educated on when it is appropriate to go to the emergency department.   Mary-Margaret Hassell Done, FNP

## 2021-08-17 ENCOUNTER — Other Ambulatory Visit: Payer: Self-pay | Admitting: Nurse Practitioner

## 2021-08-17 DIAGNOSIS — F410 Panic disorder [episodic paroxysmal anxiety] without agoraphobia: Secondary | ICD-10-CM

## 2021-08-17 NOTE — Telephone Encounter (Signed)
Pharmacy is requesting a 90 day supply

## 2021-08-17 NOTE — Telephone Encounter (Signed)
Denied.  This is a new start med and pt will be following up with MM for ongoing med management in 2 weeks.

## 2021-08-30 ENCOUNTER — Other Ambulatory Visit: Payer: Self-pay | Admitting: Nurse Practitioner

## 2021-08-30 DIAGNOSIS — F410 Panic disorder [episodic paroxysmal anxiety] without agoraphobia: Secondary | ICD-10-CM

## 2021-08-31 ENCOUNTER — Ambulatory Visit: Payer: 59 | Admitting: Nurse Practitioner

## 2021-08-31 ENCOUNTER — Encounter: Payer: Self-pay | Admitting: Nurse Practitioner

## 2021-08-31 VITALS — BP 116/77 | HR 66 | Temp 97.8°F | Resp 20 | Ht 66.0 in | Wt 206.0 lb

## 2021-08-31 DIAGNOSIS — F410 Panic disorder [episodic paroxysmal anxiety] without agoraphobia: Secondary | ICD-10-CM | POA: Diagnosis not present

## 2021-08-31 NOTE — Progress Notes (Signed)
Subjective:    Patient ID: Lisa [name], female    DOB: 1966/06/27, 56 y.o.   MRN: [id number]   Chief Complaint: recheck anxiety   HPI Patient was seen on 08/10/21 with panic attack. AT that appointment she c/o feeling anxious and panicky. She was under a lot of stress at work and home. She had also just had shoulder surgery. She described her chest as feeling tight and said she would get angry easily. We started her on lexapro 10mg . She was also [name] a handout on stress management. Today she says she is much better. She has only had 1 episode when she first started taking meds but none since. GAD 7 : Generalized Anxiety Score 08/31/2021 08/10/2021 07/19/2021  Nervous, Anxious, on Edge 1 3 0  Control/stop worrying 0 2 0  Worry too much - different things 0 2 0  Trouble relaxing 0 2 0  Restless 0 1 0  Easily annoyed or irritable 0 2 0  Afraid - awful might happen 0 1 0  Total GAD 7 Score 1 13 0  Anxiety Difficulty Not difficult at all Somewhat difficult Not difficult at all    Depression screen [facility] 2/9 08/31/2021 08/10/2021 07/19/2021  Decreased Interest 0 2 0  Down, Depressed, Hopeless 0 2 0  PHQ - 2 Score 0 4 0  Altered sleeping 0 3 0  Tired, decreased energy 0 2 0  Change in appetite 0 1 0  Feeling bad or failure about yourself  0 0 0  Trouble concentrating 0 2 0  Moving slowly or fidgety/restless 0 2 0  Suicidal thoughts 0 0 0  PHQ-9 Score 0 14 0  Difficult doing work/chores Not difficult at all Somewhat difficult Not difficult at all  Some recent data might be hidden       Review of Systems  Constitutional:  Negative for diaphoresis.  Eyes:  Negative for pain.  Respiratory:  Negative for shortness of breath.   Cardiovascular:  Negative for chest pain, palpitations and leg swelling.  Gastrointestinal:  Negative for abdominal pain.  Endocrine: Negative for polydipsia.  Skin:  Negative for rash.  Neurological:  Negative for dizziness, weakness and headaches.   Hematological:  Does not bruise/bleed easily.  All other systems reviewed and are negative.     Objective:   Physical Exam Vitals and nursing note reviewed.  Constitutional:      Appearance: Normal appearance.  Cardiovascular:     Rate and Rhythm: Normal rate and regular rhythm.     Heart sounds: Normal heart sounds.  Pulmonary:     Effort: Pulmonary effort is normal.     Breath sounds: Normal breath sounds.  Musculoskeletal:     Comments: Right shoulder in shoulder immobilizer  Skin:    General: Skin is warm.  Neurological:     General: No focal deficit present.     Mental Status: She is alert and oriented to person, place, and time.  Psychiatric:        Mood and Affect: Mood normal.        Behavior: Behavior normal.   BP 116/77    Pulse 66    Temp 97.8 F (36.6 C) (Temporal)    Resp 20    Ht 5\' 6"  (1.676 m)    Wt 206 lb (93.4 kg)    BMI 33.25 kg/m         Assessment & Plan:   Lisa [name] in today with chief complaint of recheck anxiety  1. Panic attacks Stress management Deep breathing exercises Continue lexapro as prescribe    The above assessment and management plan was discussed with the patient. The patient verbalized understanding of and has agreed to the management plan. Patient is aware to call the clinic if symptoms persist or worsen. Patient is aware when to return to the clinic for a follow-up visit. Patient educated on when it is appropriate to go to the emergency department.   Mary-Margaret Hassell Done, FNP

## 2021-08-31 NOTE — Patient Instructions (Signed)

## 2021-09-01 ENCOUNTER — Other Ambulatory Visit: Payer: Self-pay | Admitting: Nurse Practitioner

## 2021-09-01 DIAGNOSIS — F410 Panic disorder [episodic paroxysmal anxiety] without agoraphobia: Secondary | ICD-10-CM

## 2021-09-08 ENCOUNTER — Other Ambulatory Visit: Payer: Self-pay

## 2021-09-08 DIAGNOSIS — F410 Panic disorder [episodic paroxysmal anxiety] without agoraphobia: Secondary | ICD-10-CM

## 2021-09-08 MED ORDER — ESCITALOPRAM OXALATE 10 MG PO TABS: 10.0000 mg | ORAL_TABLET | Freq: Every day | ORAL | 1 refills | Status: DC

## 2021-11-08 ENCOUNTER — Ambulatory Visit: Payer: 59 | Admitting: Nurse Practitioner

## 2022-03-21 ENCOUNTER — Other Ambulatory Visit: Payer: Self-pay | Admitting: Nurse Practitioner

## 2022-03-21 DIAGNOSIS — F410 Panic disorder [episodic paroxysmal anxiety] without agoraphobia: Secondary | ICD-10-CM

## 2022-04-26 ENCOUNTER — Encounter: Payer: Self-pay | Admitting: Nurse Practitioner

## 2022-04-26 ENCOUNTER — Ambulatory Visit: Payer: 59 | Admitting: Nurse Practitioner

## 2022-04-26 VITALS — BP 119/79 | HR 73 | Temp 97.8°F | Resp 20 | Ht 66.0 in | Wt 216.0 lb

## 2022-04-26 DIAGNOSIS — J029 Acute pharyngitis, unspecified: Secondary | ICD-10-CM | POA: Diagnosis not present

## 2022-04-26 DIAGNOSIS — R5383 Other fatigue: Secondary | ICD-10-CM

## 2022-04-26 LAB — VERITOR FLU A/B WAIVED
Influenza A: NEGATIVE
Influenza B: NEGATIVE

## 2022-04-26 LAB — CULTURE, GROUP A STREP

## 2022-04-26 LAB — RAPID STREP SCREEN (MED CTR MEBANE ONLY): Strep Gp A Ag, IA W/Reflex: NEGATIVE

## 2022-04-26 MED ORDER — AZITHROMYCIN 250 MG PO TABS: ORAL_TABLET | ORAL | 0 refills | Status: DC

## 2022-04-26 NOTE — Progress Notes (Signed)
Subjective:    Patient ID: [name], female    DOB: [birth year], 56 y.o.   MRN: [id number]   Chief Complaint: sinus pressure, Sore Throat, and Fatigue   Patient comes in  c/o cough and congestion  URI  This is a new problem. The current episode started in the past 7 days. The problem has been gradually worsening. The maximum temperature recorded prior to her arrival was 100.4 - 100.9 F. The fever has been present for 1 to 2 days. Associated symptoms include congestion, coughing, rhinorrhea, sinus pain and a sore throat. Treatments tried: mucinex sinus max. The treatment provided mild relief.  Home covid test was negative on Sunday      Review of Systems  Constitutional:  Positive for fever. Negative for chills.  HENT:  Positive for congestion, rhinorrhea, sinus pain and sore throat.   Respiratory:  Positive for cough.        Objective:   Physical Exam Vitals and nursing note reviewed.  Constitutional:      General: She is not in acute distress.    Appearance: Normal appearance. She is well-developed.  HENT:     Head: Normocephalic.     Right Ear: Tympanic membrane normal.     Left Ear: Tympanic membrane normal.     Nose: Congestion and rhinorrhea present.     Mouth/Throat:     Mouth: Mucous membranes are moist.  Eyes:     Pupils: Pupils are equal, round, and reactive to light.  Neck:     Vascular: No carotid bruit or JVD.  Cardiovascular:     Rate and Rhythm: Normal rate and regular rhythm.     Heart sounds: Normal heart sounds.  Pulmonary:     Effort: Pulmonary effort is normal. No respiratory distress.     Breath sounds: Normal breath sounds. No wheezing or rales.  Chest:     Chest wall: No tenderness.  Abdominal:     General: Bowel sounds are normal. There is no distension or abdominal bruit.     Palpations: Abdomen is soft. There is no hepatomegaly, splenomegaly, mass or pulsatile mass.     Tenderness: There is no abdominal tenderness.   Musculoskeletal:        General: Normal range of motion.     Cervical back: Normal range of motion and neck supple.  Lymphadenopathy:     Cervical: No cervical adenopathy.  Skin:    General: Skin is warm and dry.  Neurological:     Mental Status: She is alert and oriented to person, place, and time.     Deep Tendon Reflexes: Reflexes are normal and symmetric.  Psychiatric:        Behavior: Behavior normal.        Thought Content: Thought content normal.        Judgment: Judgment normal.    BP 119/79   Pulse 73   Temp 97.8 F (36.6 C) (Temporal)   Resp 20   Ht '5\' 6"'$  (1.676 m)   Wt 216 lb (98 kg)   SpO2 98%   BMI 34.86 kg/m          Assessment & Plan:   [name] in today with chief complaint of sinus pressure, Sore Throat, and Fatigue   1. Sore throat - Novel Coronavirus, NAA ([facility])  2. Other fatigue - Rapid Strep Screen ([facility] ONLY) - Veritor Flu A/B Waived  1. Take meds as prescribed 2. Use a cool mist humidifier  especially during the winter months and when heat has been humid. 3. Use saline nose sprays frequently 4. Saline irrigations of the nose can be very helpful if done frequently.  * 4X daily for 1 week*  * Use of a nettie pot can be helpful with this. Follow directions with this* 5. Drink plenty of fluids 6. Keep thermostat turn down low 7.For any cough or congestion- OTC cough meds a sneeded 8. For fever or aces or pains- take tylenol or ibuprofen appropriate for age and weight.  * for fevers greater than 101 orally you may alternate ibuprofen and tylenol every  3 hours.   Meds ordered this encounter  Medications   azithromycin (ZITHROMAX Z-PAK) 250 MG tablet    Sig: As directed    Dispense:  6 tablet    Refill:  0    Order Specific Question:   Supervising Provider    Answer:   Caryl Pina A [[id number]]     The above assessment and management plan was discussed with the patient. The patient verbalized  understanding of and has agreed to the management plan. Patient is aware to call the clinic if symptoms persist or worsen. Patient is aware when to return to the clinic for a follow-up visit. Patient educated on when it is appropriate to go to the emergency department.   Mary-Margaret Hassell Done, FNP

## 2022-04-26 NOTE — Patient Instructions (Signed)
1. Take meds as prescribed 2. Use a cool mist humidifier especially during the winter months and when heat has been humid. 3. Use saline nose sprays frequently 4. Saline irrigations of the nose can be very helpful if done frequently.  * 4X daily for 1 week*  * Use of a nettie pot can be helpful with this. Follow directions with this* 5. Drink plenty of fluids 6. Keep thermostat turn down low 7.For any cough or congestion- OTC cough meds as needed 8. For fever or aces or pains- take tylenol or ibuprofen appropriate for age and weight.  * for fevers greater than 101 orally you may alternate ibuprofen and tylenol every  3 hours.

## 2022-04-27 ENCOUNTER — Other Ambulatory Visit: Payer: Self-pay | Admitting: Gastroenterology

## 2022-04-27 LAB — NOVEL CORONAVIRUS, NAA: SARS-CoV-2, NAA: NOT DETECTED

## 2022-06-08 ENCOUNTER — Other Ambulatory Visit: Payer: Self-pay | Admitting: Nurse Practitioner

## 2022-06-08 DIAGNOSIS — F410 Panic disorder [episodic paroxysmal anxiety] without agoraphobia: Secondary | ICD-10-CM

## 2022-06-08 NOTE — Telephone Encounter (Signed)
Pt scheduled for med refill on 06/23/22. Pt aware.

## 2022-06-08 NOTE — Telephone Encounter (Signed)
MM[facility] NTBS 30 days given 03/21/22

## 2022-06-23 ENCOUNTER — Encounter: Payer: Self-pay | Admitting: Nurse Practitioner

## 2022-06-23 ENCOUNTER — Ambulatory Visit: Payer: 59 | Admitting: Nurse Practitioner

## 2022-06-23 VITALS — BP 113/79 | HR 78 | Temp 96.5°F | Resp 20 | Ht 66.0 in | Wt 217.0 lb

## 2022-06-23 DIAGNOSIS — G8929 Other chronic pain: Secondary | ICD-10-CM

## 2022-06-23 DIAGNOSIS — K219 Gastro-esophageal reflux disease without esophagitis: Secondary | ICD-10-CM

## 2022-06-23 DIAGNOSIS — K5900 Constipation, unspecified: Secondary | ICD-10-CM

## 2022-06-23 DIAGNOSIS — M5441 Lumbago with sciatica, right side: Secondary | ICD-10-CM

## 2022-06-23 DIAGNOSIS — F410 Panic disorder [episodic paroxysmal anxiety] without agoraphobia: Secondary | ICD-10-CM

## 2022-06-23 DIAGNOSIS — C20 Malignant neoplasm of rectum: Secondary | ICD-10-CM

## 2022-06-23 DIAGNOSIS — F411 Generalized anxiety disorder: Secondary | ICD-10-CM | POA: Diagnosis not present

## 2022-06-23 DIAGNOSIS — G479 Sleep disorder, unspecified: Secondary | ICD-10-CM

## 2022-06-23 DIAGNOSIS — G43009 Migraine without aura, not intractable, without status migrainosus: Secondary | ICD-10-CM | POA: Diagnosis not present

## 2022-06-23 MED ORDER — ESTRADIOL 1 MG PO TABS: 1.5000 mg | ORAL_TABLET | Freq: Every day | ORAL | 1 refills | Status: DC

## 2022-06-23 MED ORDER — ESCITALOPRAM OXALATE 10 MG PO TABS: 10.0000 mg | ORAL_TABLET | Freq: Every day | ORAL | 0 refills | Status: DC

## 2022-06-23 MED ORDER — ESOMEPRAZOLE MAGNESIUM 40 MG PO CPDR: 40.0000 mg | DELAYED_RELEASE_CAPSULE | Freq: Every day | ORAL | 3 refills | Status: DC

## 2022-06-23 MED ORDER — LINACLOTIDE 290 MCG PO CAPS: ORAL_CAPSULE | ORAL | 3 refills | Status: DC

## 2022-06-23 NOTE — Progress Notes (Signed)
Subjective:    Patient ID: [name], female    DOB: [birth year], 56 y.o.   MRN: [id number]   Chief Complaint: medical management of chronic issues     HPI:  [name] is a 56 y.o. who identifies as a female who was assigned female at birth.   Social history: Lives with: husband Work history: disability   Comes in today for follow up of the following chronic medical issues:  1. Gastroesophageal reflux disease, unspecified whether esophagitis present Is on nexium as needed.  2. Migraine without aura and without status migrainosus, not intractable Haven't not had any in over 6 months.  3. Rectal cancer, cT2N0, 7 cm from anal verge Has been cleared from oncology. Has ben put on aving colonospocy every 3 years.  4. Anxiety state Is on buspar as needed. She very seldom takes. Has not had filled since 08/2021. She is on lexapro and is doing well.    06/23/2022    8:24 AM 08/31/2021   11:27 AM 08/10/2021   12:33 PM 07/19/2021    8:44 AM  GAD 7 : Generalized Anxiety Score  Nervous, Anxious, on Edge 0 1 3 0  Control/stop worrying 0 0 2 0  Worry too much - different things 0 0 2 0  Trouble relaxing 0 0 2 0  Restless 0 0 1 0  Easily annoyed or irritable 0 0 2 0  Afraid - awful might happen 0 0 1 0  Total GAD 7 Score 0 1 13 0  Anxiety Difficulty Not difficult at all Not difficult at all Somewhat difficult Not difficult at all      5. Chronic bilateral low back pain with right-sided sciatica Has chronic back pain. Uses flexeril as needed  6. Constipation She Is on linzess and is doing well.  7. Sleep disorder Sleeps about 5-6 hours a night and that is enough fro her.   New complaints: None today  Allergies  Allergen Reactions   Codeine Nausea Only    Confirmed intolerance of codeine verbally with pt in PACU, pt states does not cause any SOB or dyspnea, some nausea only (MN-RN)   Ultram [Tramadol] Nausea And Vomiting    Ultram allergy also  discussed, active N&V occurs with Ultram   Latex Itching and Rash   Outpatient Encounter Medications as of 06/23/2022  Medication Sig   azithromycin (ZITHROMAX Z-PAK) 250 MG tablet As directed   busPIRone (BUSPAR) 15 MG tablet TAKE 1 TABLET BY MOUTH 2 TIMES DAILY.   cyclobenzaprine (FLEXERIL) 10 MG tablet Take 10 mg by mouth 3 (three) times daily as needed for muscle spasms.    escitalopram (LEXAPRO) 10 MG tablet Take 1 tablet (10 mg total) by mouth daily. (NEEDS TO BE SEEN BEFORE NEXT REFILL)   esomeprazole (NEXIUM) 40 MG capsule TAKE 1 CAPSULE (40 MG TOTAL) BY MOUTH DAILY BEFORE BREAKFAST. (NEEDS TO BE SEEN BEFORE NEXT REFILL)   estradiol (ESTRACE) 1 MG tablet Take 1.5 mg by mouth daily.    LINZESS 290 MCG CAPS capsule TAKE 1 CAPSULE BY MOUTH DAILY BEFORE BREAKFAST   No facility-administered encounter medications on file as of 06/23/2022.    Past Surgical History:  Procedure Laterality Date   ABDOMINAL HYSTERECTOMY  1995   partial   BACK SURGERY  2015   COLON SURGERY     COLONOSCOPY  06/01/2012   Procedure: COLONOSCOPY;  Surgeon: Danie Binder, MD;  Location: [facility];  Service: Endoscopy;  Laterality: N/A;  1:30PM   COLONOSCOPY N/A 06/14/2013   QHU:TMLY diverticulosis/normal anastomosis   COLONOSCOPY WITH PROPOFOL N/A 02/18/2020   Procedure: COLONOSCOPY WITH PROPOFOL;  Surgeon: Daneil Dolin, MD;  Location: [facility];  Service: Endoscopy;  Laterality: N/A;  10:00am   endoscopic left fallopian tube removed  several yrs ago   EUS  06/06/2012   Procedure: LOWER ENDOSCOPIC ULTRASOUND (EUS);  Surgeon: Arta Silence, MD;  Location: [facility];  Service: Endoscopy;  Laterality: N/A;   EXAMINATION UNDER ANESTHESIA  09/07/2012   Procedure: EXAM UNDER ANESTHESIA;  Surgeon: Stark Klein, MD;  Location: [facility];  Service: General;  Laterality: N/A;   EXCISION/RELEASE BURSA HIP  09/15/2011   Dr Tonita Cong EXCISION/RELEASE BURSA HIP;  Surgeon: Johnn Hai, MD;   Location: [facility];  Service: Orthopedics;  Laterality: Left;  Excision of Trochanteric Bursitis   FLEXIBLE SIGMOIDOSCOPY N/A 03/22/2013   Procedure: FLEXIBLE SIGMOIDOSCOPY;  Surgeon: Danie Binder, MD;  Location: [facility];  Service: Endoscopy;  Laterality: N/A;  2:00   LAPAROSCOPIC LOW ANTERIOR RESECTION  07/02/2012   Procedure: LAPAROSCOPIC LOW ANTERIOR RESECTION;  Surgeon: Stark Klein, MD;  Location: [facility];  Service: General;  Laterality: N/A;   left ankle surgery for fx  several yrs ago   LUMBAR LAMINECTOMY/DECOMPRESSION MICRODISCECTOMY N/A 05/14/2014   Procedure: LUMBAR DECOMPRESSION L2-L3;  Surgeon: Johnn Hai, MD;  Location: [facility];  Service: Orthopedics;  Laterality: N/A;   MASS EXCISION Left 02/02/2018   Procedure: EXCISION CYST, LEFT INNER THIGH;  Surgeon: Aviva Signs, MD;  Location: [facility];  Service: General;  Laterality: Left;   PROCTOSCOPY  09/07/2012   Procedure: PROCTOSCOPY;  Surgeon: Stark Klein, MD;  Location: [facility];  Service: General;  Laterality: N/A;    Family History  Problem Relation Age of Onset   Other Sister    Migraines Mother    Colon cancer Paternal Aunt       Controlled substance contract: n/a     Review of Systems  Constitutional:  Negative for diaphoresis.  Eyes:  Negative for pain.  Respiratory:  Negative for shortness of breath.   Cardiovascular:  Negative for chest pain, palpitations and leg swelling.  Gastrointestinal:  Negative for abdominal pain.  Endocrine: Negative for polydipsia.  Skin:  Negative for rash.  Neurological:  Negative for dizziness, weakness and headaches.  Hematological:  Does not bruise/bleed easily.  All other systems reviewed and are negative.      Objective:   Physical Exam Vitals and nursing note reviewed.  Constitutional:      General: She is not in acute distress.    Appearance: Normal appearance. She is well-developed.  HENT:     Head: Normocephalic.     Right Ear: Tympanic  membrane normal.     Left Ear: Tympanic membrane normal.     Nose: Nose normal.     Mouth/Throat:     Mouth: Mucous membranes are moist.  Eyes:     Pupils: Pupils are equal, round, and reactive to light.  Neck:     Vascular: No carotid bruit or JVD.  Cardiovascular:     Rate and Rhythm: Normal rate and regular rhythm.     Heart sounds: Normal heart sounds.  Pulmonary:     Effort: Pulmonary effort is normal. No respiratory distress.     Breath sounds: Normal breath sounds. No wheezing or rales.  Chest:     Chest wall: No tenderness.  Abdominal:     General:  Bowel sounds are normal. There is no distension or abdominal bruit.     Palpations: Abdomen is soft. There is no hepatomegaly, splenomegaly, mass or pulsatile mass.     Tenderness: There is no abdominal tenderness.  Musculoskeletal:        General: Normal range of motion.     Cervical back: Normal range of motion and neck supple.  Lymphadenopathy:     Cervical: No cervical adenopathy.  Skin:    General: Skin is warm and dry.  Neurological:     Mental Status: She is alert and oriented to person, place, and time.     Deep Tendon Reflexes: Reflexes are normal and symmetric.  Psychiatric:        Behavior: Behavior normal.        Thought Content: Thought content normal.        Judgment: Judgment normal.    BP 113/79   Pulse 78   Temp (!) 96.5 F (35.8 C) (Temporal)   Resp 20   Ht _0  (1.676 m)   Wt 217 lb (98.4 kg)   SpO2 95%   BMI 35.02 kg/m         Assessment & Plan:   [name] comes in today with chief complaint of Medical Management of Chronic Issues and Ankle Pain (Right ankle No injury/)   Diagnosis and orders addressed:  1. Gastroesophageal reflux disease, unspecified whether esophagitis present Avoid spicy foods Do not eat 2 hours prior to bedtime - esomeprazole (NEXIUM) 40 MG capsule; Take 1 capsule (40 mg total) by mouth daily before breakfast. (Needs to be seen before next refill)   Dispense: 90 capsule; Refill: 3 - CBC with Differential/Platelet - CMP14+EGFR - Lipid panel  2. Migraine without aura and without status migrainosus, not intractable Report any return of migraines  3. Rectal cancer, cT2N0, 7 cm from anal verge Keep follow up with  4. Anxiety state Stress management  5. Chronic bilateral low back pain with right-sided sciatica Back stretches  6. Sleep disorder Bedtime routine  7. Constipation, unspecified constipation type Increase fiber in diet - linaclotide (LINZESS) 290 MCG CAPS capsule; TAKE 1 CAPSULE BY MOUTH DAILY BEFORE BREAKFAST  Dispense: 90 capsule; Refill: 3  8. Panic attacks - escitalopram (LEXAPRO) 10 MG tablet; Take 1 tablet (10 mg total) by mouth daily. (NEEDS TO BE SEEN BEFORE NEXT REFILL)  Dispense: 30 tablet; Refill: 0   Labs pending Health Maintenance reviewed Diet and exercise encouraged  Follow up plan: 6 months   Mary-Margaret Hassell Done, FNP

## 2022-06-23 NOTE — Patient Instructions (Signed)

## 2022-06-24 LAB — LIPID PANEL
Chol/HDL Ratio: 4.2 ratio (ref 0.0–4.4)
Cholesterol, Total: 206 mg/dL — ABNORMAL HIGH (ref 100–199)
HDL: 49 mg/dL
LDL Chol Calc (NIH): 128 mg/dL — ABNORMAL HIGH (ref 0–99)
Triglycerides: 165 mg/dL — ABNORMAL HIGH (ref 0–149)
VLDL Cholesterol Cal: 29 mg/dL (ref 5–40)

## 2022-06-24 LAB — CBC WITH DIFFERENTIAL/PLATELET
Basophils Absolute: 0 x10E3/uL (ref 0.0–0.2)
Basos: 1 %
EOS (ABSOLUTE): 0.2 x10E3/uL (ref 0.0–0.4)
Eos: 3 %
Hematocrit: 40.8 % (ref 34.0–46.6)
Hemoglobin: 13.5 g/dL (ref 11.1–15.9)
Immature Grans (Abs): 0 x10E3/uL (ref 0.0–0.1)
Immature Granulocytes: 0 %
Lymphocytes Absolute: 1.7 x10E3/uL (ref 0.7–3.1)
Lymphs: 30 %
MCH: 30 pg (ref 26.6–33.0)
MCHC: 33.1 g/dL (ref 31.5–35.7)
MCV: 91 fL (ref 79–97)
Monocytes Absolute: 0.3 x10E3/uL (ref 0.1–0.9)
Monocytes: 5 %
Neutrophils Absolute: 3.5 x10E3/uL (ref 1.4–7.0)
Neutrophils: 61 %
Platelets: 220 x10E3/uL (ref 150–450)
RBC: 4.5 x10E6/uL (ref 3.77–5.28)
RDW: 12.3 % (ref 11.7–15.4)
WBC: 5.6 x10E3/uL (ref 3.4–10.8)

## 2022-06-24 LAB — CMP14+EGFR
ALT: 21 IU/L (ref 0–32)
AST: 22 IU/L (ref 0–40)
Albumin/Globulin Ratio: 1.6 (ref 1.2–2.2)
Albumin: 4.2 g/dL (ref 3.8–4.9)
Alkaline Phosphatase: 74 IU/L (ref 44–121)
BUN/Creatinine Ratio: 18 (ref 9–23)
BUN: 12 mg/dL (ref 6–24)
Bilirubin Total: 0.2 mg/dL (ref 0.0–1.2)
CO2: 22 mmol/L (ref 20–29)
Calcium: 9.2 mg/dL (ref 8.7–10.2)
Chloride: 102 mmol/L (ref 96–106)
Creatinine, Ser: 0.67 mg/dL (ref 0.57–1.00)
Globulin, Total: 2.6 g/dL (ref 1.5–4.5)
Glucose: 72 mg/dL (ref 70–99)
Potassium: 4.3 mmol/L (ref 3.5–5.2)
Sodium: 139 mmol/L (ref 134–144)
Total Protein: 6.8 g/dL (ref 6.0–8.5)
eGFR: 104 mL/min/{1.73_m2} (ref >59)

## 2022-07-07 ENCOUNTER — Other Ambulatory Visit: Payer: Self-pay | Admitting: Nurse Practitioner

## 2022-07-15 ENCOUNTER — Other Ambulatory Visit: Payer: Self-pay | Admitting: Nurse Practitioner

## 2022-07-15 DIAGNOSIS — F410 Panic disorder [episodic paroxysmal anxiety] without agoraphobia: Secondary | ICD-10-CM

## 2022-08-10 ENCOUNTER — Other Ambulatory Visit: Payer: Self-pay | Admitting: Nurse Practitioner

## 2022-08-10 DIAGNOSIS — F410 Panic disorder [episodic paroxysmal anxiety] without agoraphobia: Secondary | ICD-10-CM

## 2022-09-09 ENCOUNTER — Emergency Department (HOSPITAL_COMMUNITY): Payer: 59

## 2022-09-09 ENCOUNTER — Emergency Department (HOSPITAL_COMMUNITY)
Admission: EM | Admit: 2022-09-09 | Discharge: 2022-09-09 | Disposition: A | Discharge status: Home / Self Care | Payer: 59 | Attending: Emergency Medicine | Admitting: Emergency Medicine

## 2022-09-09 DIAGNOSIS — R079 Chest pain, unspecified: Secondary | ICD-10-CM | POA: Diagnosis present

## 2022-09-09 DIAGNOSIS — I471 Supraventricular tachycardia, unspecified: Secondary | ICD-10-CM | POA: Insufficient documentation

## 2022-09-09 DIAGNOSIS — Z91040 Latex allergy status: Secondary | ICD-10-CM | POA: Diagnosis not present

## 2022-09-09 LAB — BASIC METABOLIC PANEL
Anion gap: 9 (ref 5–15)
BUN: 18 mg/dL (ref 6–20)
CO2: 24 mmol/L (ref 22–32)
Calcium: 9.3 mg/dL (ref 8.9–10.3)
Chloride: 105 mmol/L (ref 98–111)
Creatinine, Ser: 0.79 mg/dL (ref 0.44–1.00)
GFR, Estimated: >60 mL/min (ref >60)
Glucose, Bld: 96 mg/dL (ref 70–99)
Potassium: 3.6 mmol/L (ref 3.5–5.1)
Sodium: 138 mmol/L (ref 135–145)

## 2022-09-09 LAB — CBC
HCT: 41.9 % (ref 36.0–46.0)
Hemoglobin: 13.8 g/dL (ref 12.0–15.0)
MCH: 30.5 pg (ref 26.0–34.0)
MCHC: 32.9 g/dL (ref 30.0–36.0)
MCV: 92.5 fL (ref 80.0–100.0)
Platelets: 240 10*3/uL (ref 150–400)
RBC: 4.53 MIL/uL (ref 3.87–5.11)
RDW: 12.4 % (ref 11.5–15.5)
WBC: 7.6 10*3/uL (ref 4.0–10.5)
nRBC: 0 % (ref 0.0–0.2)

## 2022-09-09 LAB — MAGNESIUM: Magnesium: 2 mg/dL (ref 1.7–2.4)

## 2022-09-09 MED ORDER — LACTATED RINGERS IV BOLUS
1000.0000 mL | Freq: Once | INTRAVENOUS | Status: AC
  Administered 2022-09-09: 1000 mL via INTRAVENOUS

## 2022-09-09 MED ORDER — DILTIAZEM HCL 25 MG/5ML IV SOLN
20.0000 mg | Freq: Once | INTRAVENOUS | Status: DC
  Filled 2022-09-09: qty 5

## 2022-09-09 NOTE — ED Triage Notes (Signed)
Pt to ED c/o sudden onset chest pain while at work, pt reports she was sitting down when chest pain began . Describes as "Tightness" reports SHOB, deneis n/v. HR 150s triage.

## 2022-09-09 NOTE — ED Provider Notes (Signed)
[facility] Provider Note   CSN: [id number] Arrival date & time: 09/09/22  1556     History  Chief Complaint  Patient presents with   Chest Pain    [name] is a 57 y.o. female.  HPI 57 year old female presents with chest tightness and elevated heart rate.  Started about an hour ago at work.  Felt a tightness in her chest and I need to frequently cough.  She also noticed high heart rate.  She is feeling a little short of breath as well.  This has happened to her once before a few years ago and it seems like she was diagnosed with SVT but has never recurred up until now.  No recent change in her medications or any recent illness.  No known history of CAD.  No leg swelling.  Went to the nurse where they found her heart rate to be high at work and tried vagal maneuvers without relief.  Home Medications Prior to Admission medications   Medication Sig Start Date End Date Taking? Authorizing Provider  escitalopram (LEXAPRO) 10 MG tablet TAKE 1 TABLET BY MOUTH EVERY DAY 08/10/22   Hassell Done, Mary-Margaret, FNP  esomeprazole (NEXIUM) 40 MG capsule Take 1 capsule (40 mg total) by mouth daily before breakfast. (Needs to be seen before next refill) 06/23/22   Hassell Done, Mary-Margaret, FNP  estradiol (ESTRACE) 1 MG tablet TAKE 1.5 TABLETS BY MOUTH DAILY. 07/11/22   Chevis Pretty, FNP  linaclotide Rolan Lipa) 290 MCG CAPS capsule TAKE 1 CAPSULE BY MOUTH DAILY BEFORE BREAKFAST 06/23/22   Chevis Pretty, FNP      Allergies    Codeine, Ultram [tramadol], and Latex    Review of Systems   Review of Systems  Constitutional:  Negative for fever.  Respiratory:  Positive for chest tightness and shortness of breath.   Cardiovascular:  Positive for palpitations. Negative for leg swelling.    Physical Exam Updated Vital Signs BP 106/76   Pulse 65   Temp (!) 96.5 F (35.8 C)   Resp 18   Ht '5\' 6"'$  (1.676 m)   Wt 98.4 kg   SpO2 92%    BMI 35.02 kg/m  Physical Exam Vitals and nursing note reviewed.  Constitutional:      Appearance: She is well-developed.  HENT:     Head: Normocephalic and atraumatic.  Cardiovascular:     Rate and Rhythm: Regular rhythm. Tachycardia present.     Heart sounds: Normal heart sounds.  Pulmonary:     Effort: Pulmonary effort is normal.     Breath sounds: Normal breath sounds.  Abdominal:     Palpations: Abdomen is soft.     Tenderness: There is no abdominal tenderness.  Musculoskeletal:     Right lower leg: No tenderness. No edema.     Left lower leg: No tenderness. No edema.  Skin:    General: Skin is warm and dry.  Neurological:     Mental Status: She is alert.     ED Results / Procedures / Treatments   Labs (all labs ordered are listed, but only abnormal results are displayed) Labs Reviewed  BASIC METABOLIC PANEL  CBC  MAGNESIUM    EKG EKG Interpretation  Date/Time:  Friday September 09 2022 16:43:06 EST Ventricular Rate:  88 PR Interval:  175 QRS Duration: 88 QT Interval:  347 QTC Calculation: 420 R Axis:   64 Text Interpretation: Sinus rhythm Probable left atrial enlargement Low voltage, precordial leads SVT no  longer present when compared to earlier in the day Confirmed by Sherwood Gambler [phone]) on 09/09/2022 4:46:17 PM  Radiology DG Chest Port 1 View  Result Date: 09/09/2022 CLINICAL DATA:  [id number] Chest pain [id number] EXAM: PORTABLE CHEST 1 VIEW COMPARISON:  December 12, 2018 FINDINGS: The cardiomediastinal silhouette is unchanged in contour. No pleural effusion. No pneumothorax. No acute pleuroparenchymal abnormality. IMPRESSION: No acute cardiopulmonary abnormality. Electronically Signed   By: Valentino Saxon M.D.   On: 09/09/2022 16:48    Procedures Procedures    Medications Ordered in ED Medications  lactated ringers bolus 1,000 mL (1,000 mLs Intravenous New Bag/Given 09/09/22 1640)    ED Course/ Medical Decision Making/ A&P                              Medical Decision Making Amount and/or Complexity of Data Reviewed External Data Reviewed: notes. Labs: ordered.    Details: Normal labs Radiology: ordered and independent interpretation performed.    Details: No CHF on Xray ECG/medicine tests: ordered and independent interpretation performed.    Details: SVT with some rate related ST changes.  Last ECG shows SVT has resolved  Risk Prescription drug management.   Patient presents with what appears to be SVT.  Prior to giving her medicine she converted on her own.  Vagal maneuver did not work while in the emergency department.  Otherwise her vitals have been stable.  Her chest symptoms resolved and thus I do not think troponins are warranted as I think her chest tightness and dyspnea were rate related findings rather than ACS.  Doubt ACS, PE, etc.  She has had similar SVT about 4 years ago.  Will refer back to cardiology.  Otherwise she appears stable for discharge home with return precautions.        Final Clinical Impression(s) / ED Diagnoses Final diagnoses:  SVT (supraventricular tachycardia)    Rx / DC Orders ED Discharge Orders          Ordered    Ambulatory referral to Cardiology       Comments: If you have not heard from the Cardiology office within the next 72 hours please call [phone].   09/09/22 1738              Sherwood Gambler, MD 09/09/22 [phone]

## 2022-09-09 NOTE — Discharge Instructions (Signed)
If you develop recurrent chest pain, palpitations, dizziness, shortness of breath, or any other new/concerning symptoms, then return to the ER or call 911

## 2022-09-12 ENCOUNTER — Telehealth: Payer: Self-pay

## 2022-09-12 NOTE — Telephone Encounter (Signed)
Transition Care Management Follow-up Telephone Call Date of discharge and from where: [facility] 09/09/2022 How have you been since you were released from the hospital? better Any questions or concerns? No  Items Reviewed: Did the pt receive and understand the discharge instructions provided? Yes  Medications obtained and verified? Yes  Other? No  Any new allergies since your discharge? No  Dietary orders reviewed? Yes Do you have support at home? Yes   Home Care and Equipment/Supplies: Were home health services ordered? no If so, what is the name of the agency? N/a  Has the agency set up a time to come to the patient's home? no Were any new equipment or medical supplies ordered?  No What is the name of the medical supply agency? N/a Were you able to get the supplies/equipment? no Do you have any questions related to the use of the equipment or supplies? No  Functional Questionnaire: (I = Independent and D = Dependent) ADLs: I  Bathing/Dressing- I  Meal Prep- I  Eating- I  Maintaining continence- I  Transferring/Ambulation- I  Managing Meds- I  Follow up appointments reviewed:  PCP Hospital f/u appt confirmed? No   Specialist Hospital f/u appt confirmed? No  Patient is to call cardiology Are transportation arrangements needed? No  If their condition worsens, is the pt aware to call PCP or go to the Emergency Dept.? Yes Was the patient provided with contact information for the PCP's office or ED? Yes Was to pt encouraged to call back with questions or concerns? Yes  Juanda Crumble, LPN [place] Direct Dial [phone]

## 2022-09-15 DIAGNOSIS — I471 Supraventricular tachycardia, unspecified: Secondary | ICD-10-CM

## 2022-09-15 HISTORY — DX: Supraventricular tachycardia, unspecified: I47.10

## 2022-09-19 ENCOUNTER — Encounter: Payer: Self-pay | Admitting: Internal Medicine

## 2022-09-19 ENCOUNTER — Ambulatory Visit: Payer: 59 | Attending: Internal Medicine | Admitting: Internal Medicine

## 2022-09-19 VITALS — BP 122/82 | HR 72 | Ht 66.0 in | Wt 214.6 lb

## 2022-09-19 DIAGNOSIS — I471 Supraventricular tachycardia, unspecified: Secondary | ICD-10-CM

## 2022-09-19 MED ORDER — DILTIAZEM HCL 30 MG PO TABS: 30.0000 mg | ORAL_TABLET | Freq: Four times a day (QID) | ORAL | 3 refills | Status: DC

## 2022-09-19 NOTE — Progress Notes (Signed)
Cardiology Office Note:    Date:  09/19/2022   ID:  [name], DOB [birth year], MRN [id number]  PCP:  Chevis Pretty, [place] Providers Cardiologist:  Janina Mayo, MD     Referring MD: Sherwood Gambler, MD   No chief complaint on file. SVT  History of Present Illness:    [name] is a 57 y.o. female with a hx of SVT, GERD, rectal cancer s/p resection, no chemotherapy.   She notes another episode of fast heart racing. She felt LH and she was SOB. No syncope. No family hx of arrhythmia . Uncle had CABG around the age of 85  Saw Dr. Bronson Ing in 2020. She has SVT at that time  EKG 09/09/2022 -SVT  Past Medical History:  Diagnosis Date   Anxiety    NEW-DUE TO ANXIETY OVER DX OF CANCER   Blood transfusion 1995   Cancer ([place]) 2014   RECTAL CANCER   Closed fracture of left distal fibula 04/27/2018   Constipation    SOME BLOOD IN STOOL   GERD (gastroesophageal reflux disease)    occasionally-NO MEDS   History of kidney stones    Kidney stone    PONV (postoperative nausea and vomiting)    used a scop patch last surgery-was better    Past Surgical History:  Procedure Laterality Date   ABDOMINAL HYSTERECTOMY  1995   partial   BACK SURGERY  2015   COLON SURGERY     COLONOSCOPY  06/01/2012   Procedure: COLONOSCOPY;  Surgeon: Danie Binder, MD;  [facility];  Service: Endoscopy;  Laterality: N/A;  1:30PM   COLONOSCOPY N/A 06/14/2013   BTD:VVOH diverticulosis/normal anastomosis   COLONOSCOPY WITH PROPOFOL N/A 02/18/2020   Procedure: COLONOSCOPY WITH PROPOFOL;  Surgeon: Daneil Dolin, MD;  [facility];  Service: Endoscopy;  Laterality: N/A;  10:00am   endoscopic left fallopian tube removed  several yrs ago   EUS  06/06/2012   Procedure: LOWER ENDOSCOPIC ULTRASOUND (EUS);  Surgeon: Arta Silence, MD;  Location: [facility];  Service: Endoscopy;  Laterality: N/A;   EXAMINATION UNDER ANESTHESIA   09/07/2012   Procedure: EXAM UNDER ANESTHESIA;  Surgeon: Stark Klein, MD;  Location: [facility];  Service: General;  Laterality: N/A;   EXCISION/RELEASE BURSA HIP  09/15/2011   Dr Tonita Cong EXCISION/RELEASE BURSA HIP;  Surgeon: Johnn Hai, MD;  [facility];  Service: Orthopedics;  Laterality: Left;  Excision of Trochanteric Bursitis   FLEXIBLE SIGMOIDOSCOPY N/A 03/22/2013   Procedure: FLEXIBLE SIGMOIDOSCOPY;  Surgeon: Danie Binder, MD;  [facility];  Service: Endoscopy;  Laterality: N/A;  2:00   LAPAROSCOPIC LOW ANTERIOR RESECTION  07/02/2012   Procedure: LAPAROSCOPIC LOW ANTERIOR RESECTION;  Surgeon: Stark Klein, MD;  [facility];  Service: General;  Laterality: N/A;   left ankle surgery for fx  several yrs ago   LUMBAR LAMINECTOMY/DECOMPRESSION MICRODISCECTOMY N/A 05/14/2014   Procedure: LUMBAR DECOMPRESSION L2-L3;  Surgeon: Johnn Hai, MD;  [facility];  Service: Orthopedics;  Laterality: N/A;   MASS EXCISION Left 02/02/2018   Procedure: EXCISION CYST, LEFT INNER THIGH;  Surgeon: Aviva Signs, MD;  [facility];  Service: General;  Laterality: Left;   PROCTOSCOPY  09/07/2012   Procedure: PROCTOSCOPY;  Surgeon: Stark Klein, MD;  Location: [facility];  Service: General;  Laterality: N/A;    Current Medications: Current Meds  Medication Sig   celecoxib (CELEBREX) 200  MG capsule Take 200 mg by mouth 2 (two) times daily.   cyclobenzaprine (FLEXERIL) 10 MG tablet Take 10 mg by mouth 3 (three) times daily as needed.   diclofenac (VOLTAREN) 75 MG EC tablet Take 1 tablet by mouth 2 (two) times daily.   diclofenac Sodium (VOLTAREN) 1 % GEL Apply topically 4 (four) times daily.   diltiazem (CARDIZEM) 30 MG tablet Take 1 tablet (30 mg total) by mouth 4 (four) times daily.   escitalopram (LEXAPRO) 10 MG tablet TAKE 1 TABLET BY MOUTH EVERY DAY   esomeprazole (NEXIUM) 40 MG capsule Take 1 capsule (40 mg total) by mouth daily before  breakfast. (Needs to be seen before next refill)   estradiol (ESTRACE) 1 MG tablet TAKE 1.5 TABLETS BY MOUTH DAILY.   linaclotide (LINZESS) 290 MCG CAPS capsule TAKE 1 CAPSULE BY MOUTH DAILY BEFORE BREAKFAST     Allergies:   Codeine, Ultram [tramadol], and Latex   Social History   Socioeconomic History   Marital status: Single    Spouse name: Not on file   Number of children: 2   Years of education: Not on file   Highest education level: Not on file  Occupational History   Occupation: primary Mining engineer    Employer: [employer]  Tobacco Use   Smoking status: Never   Smokeless tobacco: Never  Vaping Use   Vaping Use: Never used  Substance and Sexual Activity   Alcohol use: Yes    Comment: occasional couple times per mo   Drug use: No   Sexual activity: Yes    Birth control/protection: Surgical  Other Topics Concern   Not on file  Social History Narrative   Lives alone & 2 kids         Social Determinants of Health   Financial Resource Strain: Not on file  Food Insecurity: Not on file  Transportation Needs: Not on file  Physical Activity: Not on file  Stress: Not on file  Social Connections: Not on file     Family History: The patient's family history includes Colon cancer in her paternal aunt; Migraines in her mother; Other in her sister.  ROS:   Please see the history of present illness.     All other systems reviewed and are negative.  EKGs/Labs/Other Studies Reviewed:    The following studies were reviewed today:   EKG:  EKG is  ordered today.  The ekg ordered today demonstrates   09/19/2022- NSR, possible LAE  Recent Labs: 06/23/2022: ALT 21 09/09/2022: BUN 18; Creatinine, Ser 0.79; Hemoglobin 13.8; Magnesium 2.0; Platelets 240; Potassium 3.6; Sodium 138   Recent Lipid Panel    Component Value Date/Time   CHOL 206 (H) 06/23/2022 0855   TRIG 165 (H) 06/23/2022 0855   HDL 49 06/23/2022 0855   CHOLHDL 4.2 06/23/2022 0855   LDLCALC 128 (H)  06/23/2022 0855     Risk Assessment/Calculations:     Physical Exam:    VS:  Vitals:   09/19/22 1545  BP: 122/82  Pulse: 72  SpO2: 98%      BP 122/82   Pulse 72   Ht '5\' 6"'$  (1.676 m)   Wt 214 lb 9.6 oz (97.3 kg)   SpO2 98%   BMI 34.64 kg/m     Wt Readings from Last 3 Encounters:  09/19/22 214 lb 9.6 oz (97.3 kg)  09/09/22 217 lb (98.4 kg)  06/23/22 217 lb (98.4 kg)     GEN:  Well nourished, well developed in no  acute distress HEENT: Normal NECK: No JVD; No carotid bruits LYMPHATICS: No lymphadenopathy CARDIAC: RRR, no murmurs, rubs, gallops RESPIRATORY:  Clear to auscultation without rales, wheezing or rhonchi  ABDOMEN: Soft, non-tender, non-distended MUSCULOSKELETAL:  No edema; No deformity  SKIN: Warm and dry NEUROLOGIC:  Alert and oriented x 3 PSYCHIATRIC:  Normal affect   ASSESSMENT:    SVT: discussed vagal maneuvers. Diltiazem 30 mg q6h PRN for palpitations.  PLAN:    In order of problems listed above:  Diltiazem 30 mg q6h PRN Follow up 6 months      Medication Adjustments/Labs and Tests Ordered: Current medicines are reviewed at length with the patient today.  Concerns regarding medicines are outlined above.  Orders Placed This Encounter  Procedures   EKG 12-Lead   Meds ordered this encounter  Medications   diltiazem (CARDIZEM) 30 MG tablet    Sig: Take 1 tablet (30 mg total) by mouth 4 (four) times daily.    Dispense:  120 tablet    Refill:  3    Patient Instructions  Medication Instructions:  Your physician has recommended you make the following change in your medication:  START: Diltiazem '30mg'$  4 times daily as needed *If you need a refill on your cardiac medications before your next appointment, please call your pharmacy*   Lab Work: NONE If you have labs (blood work) drawn today and your tests are completely normal, you will receive your results only by: [place] (if you have MyChart) OR A paper copy in the mail If you  have any lab test that is abnormal or we need to change your treatment, we will call you to review the results.   Testing/Procedures: NONE   Follow-Up: At [facility], you and your health needs are our priority.  As part of our continuing mission to provide you with exceptional heart care, we have created designated Provider Care Teams.  These Care Teams include your primary Cardiologist (physician) and Advanced Practice Providers (APPs -  Physician Assistants and Nurse Practitioners) who all work together to provide you with the care you need, when you need it.  We recommend signing up for the patient portal called "MyChart".  Sign up information is provided on this After Visit Summary.  MyChart is used to connect with patients for Virtual Visits (Telemedicine).  Patients are able to view lab/test results, encounter notes, upcoming appointments, etc.  Non-urgent messages can be sent to your provider as well.   To learn more about what you can do with MyChart, go to [email or web address].    Your next appointment:   6 month(s)  Provider:   Janina Mayo, MD      Signed, Janina Mayo, MD  09/19/2022 4:22 PM    [place]

## 2022-09-19 NOTE — Patient Instructions (Signed)
Medication Instructions:  Your physician has recommended you make the following change in your medication:  START: Diltiazem '30mg'$  4 times daily as needed *If you need a refill on your cardiac medications before your next appointment, please call your pharmacy*   Lab Work: NONE If you have labs (blood work) drawn today and your tests are completely normal, you will receive your results only by: [place] (if you have MyChart) OR A paper copy in the mail If you have any lab test that is abnormal or we need to change your treatment, we will call you to review the results.   Testing/Procedures: NONE   Follow-Up: At [facility], you and your health needs are our priority.  As part of our continuing mission to provide you with exceptional heart care, we have created designated Provider Care Teams.  These Care Teams include your primary Cardiologist (physician) and Advanced Practice Providers (APPs -  Physician Assistants and Nurse Practitioners) who all work together to provide you with the care you need, when you need it.  We recommend signing up for the patient portal called "MyChart".  Sign up information is provided on this After Visit Summary.  MyChart is used to connect with patients for Virtual Visits (Telemedicine).  Patients are able to view lab/test results, encounter notes, upcoming appointments, etc.  Non-urgent messages can be sent to your provider as well.   To learn more about what you can do with MyChart, go to [email or web address].    Your next appointment:   6 month(s)  Provider:   Janina Mayo, MD

## 2022-10-03 ENCOUNTER — Ambulatory Visit: Payer: 59 | Admitting: Nurse Practitioner

## 2022-10-03 ENCOUNTER — Encounter: Payer: Self-pay | Admitting: Nurse Practitioner

## 2022-10-03 VITALS — BP 121/81 | HR 76 | Temp 97.9°F | Resp 20 | Ht 66.0 in | Wt 219.0 lb

## 2022-10-03 DIAGNOSIS — E6609 Other obesity due to excess calories: Secondary | ICD-10-CM

## 2022-10-03 DIAGNOSIS — Z6834 Body mass index (BMI) 34.0-34.9, adult: Secondary | ICD-10-CM | POA: Diagnosis not present

## 2022-10-03 DIAGNOSIS — R300 Dysuria: Secondary | ICD-10-CM | POA: Diagnosis not present

## 2022-10-03 LAB — URINALYSIS, COMPLETE
Bilirubin, UA: NEGATIVE
Glucose, UA: NEGATIVE
Ketones, UA: NEGATIVE
Leukocytes,UA: NEGATIVE
Nitrite, UA: NEGATIVE
Specific Gravity, UA: >1.03 — ABNORMAL HIGH (ref 1.005–1.030)
Urobilinogen, Ur: 0.2 mg/dL (ref 0.2–1.0)
pH, UA: 5.5 (ref 5.0–7.5)

## 2022-10-03 LAB — MICROSCOPIC EXAMINATION
Renal Epithel, UA: NONE SEEN /hpf
WBC, UA: NONE SEEN /hpf (ref 0–5)

## 2022-10-03 NOTE — Progress Notes (Signed)
Subjective:    Patient ID: [name], female    DOB: 1966-03-01, 57 y.o.   MRN: [id number]   Chief Complaint: Wants to discuss weight (Would like to try Wegovy/) and Dysuria   Dysuria    Patient come sin to discuss: -dysuria- started last week. Slight back pain. Has urgency and frequency. Has gotten worse over the weekend  - weight gain-Patient's BMI is >30 mg/m2.  Patient's current BMI is Body mass index is 35.35 kg/m.Marland Kitchen  Patient is currently enrolled in a healthy eating plan along with encouraged exercise.  Patient has contraindications to phentermine, Contrave & Qsymia (contains phentermine).  Patient does not have a personal or family history of medullary thyroid carcinoma (MTC) or Multiple Endocrine Neoplasia syndrome type 2 (MEN 2).  Wt Readings from Last 3 Encounters:  10/03/22 219 lb (99.3 kg)  09/19/22 214 lb 9.6 oz (97.3 kg)  09/09/22 217 lb (98.4 kg)   BMI Readings from Last 3 Encounters:  10/03/22 35.35 kg/m  09/19/22 34.64 kg/m  09/09/22 35.02 kg/m      Patient Active Problem List   Diagnosis Date Noted   Fever blister 10/05/2020   Chronic low back pain 09/01/2017   Migraine without aura 08/10/2017   Sleep disorder 07/10/2017   GERD (gastroesophageal reflux disease) 05/03/2017   HNP (herniated nucleus pulposus), lumbar 05/14/2014   Internal and external prolapsed hemorrhoids 03/15/2013   Anxiety state 03/15/2013   Rectal cancer, cT2N0, 7 cm from anal verge 06/11/2012   Constipation 05/25/2012        Review of Systems  Constitutional:  Negative for diaphoresis.  Eyes:  Negative for pain.  Respiratory:  Negative for shortness of breath.   Cardiovascular:  Negative for chest pain, palpitations and leg swelling.  Gastrointestinal:  Negative for abdominal pain.  Endocrine: Negative for polydipsia.  Genitourinary:  Positive for dysuria.  Skin:  Negative for rash.  Neurological:  Negative for dizziness, weakness and headaches.   Hematological:  Does not bruise/bleed easily.  All other systems reviewed and are negative.      Objective:   Physical Exam Vitals and nursing note reviewed.  Constitutional:      General: She is not in acute distress.    Appearance: Normal appearance. She is well-developed.  Neck:     Vascular: No carotid bruit or JVD.  Cardiovascular:     Rate and Rhythm: Normal rate and regular rhythm.     Heart sounds: Normal heart sounds.  Pulmonary:     Effort: Pulmonary effort is normal. No respiratory distress.     Breath sounds: Normal breath sounds. No wheezing or rales.  Chest:     Chest wall: No tenderness.  Abdominal:     General: Bowel sounds are normal. There is no distension or abdominal bruit.     Palpations: Abdomen is soft. There is no hepatomegaly, splenomegaly, mass or pulsatile mass.     Tenderness: There is no abdominal tenderness.  Musculoskeletal:        General: Normal range of motion.     Cervical back: Normal range of motion and neck supple.  Lymphadenopathy:     Cervical: No cervical adenopathy.  Skin:    General: Skin is warm and dry.  Neurological:     Mental Status: She is alert and oriented to person, place, and time.     Deep Tendon Reflexes: Reflexes are normal and symmetric.  Psychiatric:        Behavior: Behavior normal.  Thought Content: Thought content normal.        Judgment: Judgment normal.    BP 121/81   Pulse 76   Temp 97.9 F (36.6 C) (Temporal)   Resp 20   Ht 5' 6"$  (1.676 m)   Wt 219 lb (99.3 kg)   SpO2 96%   BMI 35.35 kg/m         Assessment & Plan:   [name] in today with chief complaint of Wants to discuss weight (Would like to try Wegovy/) and Dysuria   1. Dysuria Urine clear - Urinalysis, Complete - Urine Culture  2. Class 1 obesity due to excess calories with serious comorbidity and body mass index (BMI) of 34.0 to 34.9 in adult Patient is going to heck with insurance to see if they cover  wegovy- she will let us know.    The above assessment and management plan was discussed with the patient. The patient verbalized understanding of and has agreed to the management plan. Patient is aware to call the clinic if symptoms persist or worsen. Patient is aware when to return to the clinic for a follow-up visit. Patient educated on when it is appropriate to go to the emergency department.   Mary-Margaret Hassell Done, FNP

## 2022-10-03 NOTE — Patient Instructions (Signed)

## 2022-10-04 MED ORDER — WEGOVY 0.25 MG/0.5ML ~~LOC~~ SOAJ: 0.2500 mg | SUBCUTANEOUS | 0 refills | Status: DC

## 2022-10-04 NOTE — Telephone Encounter (Signed)
Prescription sent to pharmacy- they will send [place] the prior auth form/ request and we will go from there

## 2022-10-05 LAB — URINE CULTURE

## 2022-10-06 ENCOUNTER — Other Ambulatory Visit: Payer: Self-pay | Admitting: Internal Medicine

## 2022-10-11 ENCOUNTER — Other Ambulatory Visit: Payer: Self-pay | Admitting: Internal Medicine

## 2022-10-13 ENCOUNTER — Telehealth: Payer: Self-pay

## 2022-10-13 NOTE — Telephone Encounter (Signed)
Fax sent to the plan Your PA has been faxed to the plan as a paper copy. Please contact the plan directly if you haven't received a determination in a typical timeframe.  You will be notified of the determination electronically and via fax.

## 2022-10-18 ENCOUNTER — Other Ambulatory Visit (HOSPITAL_COMMUNITY): Payer: Self-pay

## 2022-10-18 NOTE — Telephone Encounter (Signed)
PA has been approved and medication has been picked up

## 2022-11-07 MED ORDER — WEGOVY 0.5 MG/0.5ML ~~LOC~~ SOAJ: 0.5000 mg | SUBCUTANEOUS | 2 refills | Status: DC

## 2022-11-07 NOTE — Addendum Note (Signed)
Addended by: Chevis Pretty on: 11/07/2022 02:32 PM   Modules accepted: Orders

## 2022-11-07 NOTE — Telephone Encounter (Signed)
Meds ordered this encounter  Medications   DISCONTD: Semaglutide-Weight Management (WEGOVY) 0.25 MG/0.5ML SOAJ    Sig: Inject 0.25 mg into the skin once a week.    Dispense:  2 mL    Refill:  0    Order Specific Question:   Supervising Provider    Answer:   Caryl Pina A [id number]   Semaglutide-Weight Management (WEGOVY) 0.5 MG/0.5ML SOAJ    Sig: Inject 0.5 mg into the skin once a week.    Dispense:  2 mL    Refill:  2    Order Specific Question:   Supervising Provider    Answer:   Worthy Rancher [id number]   [place], FNP

## 2022-11-15 ENCOUNTER — Ambulatory Visit: Payer: 59 | Admitting: Orthopedic Surgery

## 2022-11-15 ENCOUNTER — Other Ambulatory Visit (INDEPENDENT_AMBULATORY_CARE_PROVIDER_SITE_OTHER): Payer: 59

## 2022-11-15 ENCOUNTER — Encounter: Payer: Self-pay | Admitting: Orthopedic Surgery

## 2022-11-15 VITALS — BP 139/120 | HR 73 | Ht 66.0 in | Wt 210.0 lb

## 2022-11-15 DIAGNOSIS — M19012 Primary osteoarthritis, left shoulder: Secondary | ICD-10-CM | POA: Diagnosis not present

## 2022-11-15 DIAGNOSIS — G8929 Other chronic pain: Secondary | ICD-10-CM

## 2022-11-15 DIAGNOSIS — M25512 Pain in left shoulder: Secondary | ICD-10-CM

## 2022-11-15 MED ORDER — METHYLPREDNISOLONE ACETATE 40 MG/ML IJ SUSP
40.0000 mg | Freq: Once | INTRAMUSCULAR | Status: AC
  Administered 2022-11-15: 40 mg via INTRA_ARTICULAR

## 2022-11-15 NOTE — Patient Instructions (Signed)

## 2022-11-16 NOTE — Progress Notes (Signed)
New Patient Visit  Assessment: [name] is a 57 y.o. female with the following: 1.  Left shoulder glenohumeral joint arthritis   Plan: Guila Homola is LHD dominant and has progressively worsening left shoulder pain.  No specific injury.  Has been ongoing for a couple of years.  She reports she had an injection about a year ago, but it did not help.  Limited range of motion.  Medications are not helpful.  We reviewed radiographs in clinic today which demonstrates advanced degenerative changes.  Near complete loss of glenohumeral joint space.  This is most likely the cause of her pain.  We discussed multiple options at this point, and she would like to avoid surgery if at all possible.  I recommended a glenohumeral joint injection.  This was completed under ultrasound today.  There were no issues.  She will follow-up in clinic as needed.    Procedure note injection - Left shoulder, ultrasound guidance   Verbal consent was obtained to inject the Left shoulder, glenohumeral joint  Timeout was completed to confirm the site of injection.   Using the ultrasound, the rotator cuff tendons were identified.  The joint space was also identified. The skin was prepped with alcohol and ethyl chloride was sprayed at the injection site.  A 21-gauge needle was used to inject 40 mg of Depo-Medrol and 1% lidocaine (4 cc) into the glenohumeral joint space of the Left shoulder using a posterolateral approach.  The needle was visualized entering the glenohumeral joint, and the medication was also visualized. There were no complications.  A sterile bandage was applied.   Note: In order to accurately identify the placement of the needle, ultrasound was required, to increase the accuracy, and specificity of the injection.   Follow-up: Return if symptoms worsen or fail to improve.  Subjective:  Chief Complaint  Patient presents with   Shoulder Pain    Left/ has been seen at [facility] had  injection unknown when about a year ago does not have records     History of Present Illness: [name] is a 57 y.o. female who presents for evaluation of left shoulder pain.  She has had pain in the left shoulder on and off for several years.  No specific injury.  She was seen elsewhere, approximately 1 year ago.  At that time, she had a subacromial steroid injection.  She reports minimal improvement in her symptoms.  She has a deep aching pain within the left shoulder.  Limited range of motion.  She has not worked with physical therapy.   Review of Systems: No fevers or chills No numbness or tingling No chest pain No shortness of breath No bowel or bladder dysfunction No GI distress No headaches   Medical History:  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   Closed fracture of left distal fibula 04/27/2018   Constipation    SOME BLOOD IN STOOL   GERD (gastroesophageal reflux disease)    occasionally-NO MEDS   History of kidney stones    Kidney stone    PONV (postoperative nausea and vomiting)    used a scop patch last surgery-was better   Sleep disorder 07/10/2017    Past Surgical History:  Procedure Laterality Date   ABDOMINAL HYSTERECTOMY  1995   partial   BACK SURGERY  2015   COLON  SURGERY     COLONOSCOPY  06/01/2012   Procedure: COLONOSCOPY;  Surgeon: Danie Binder, MD;  [facility];  Service: Endoscopy;  Laterality: N/A;  1:30PM   COLONOSCOPY N/A 06/14/2013   [id number] diverticulosis/normal anastomosis   COLONOSCOPY WITH PROPOFOL N/A 02/18/2020   Procedure: COLONOSCOPY WITH PROPOFOL;  Surgeon: Daneil Dolin, MD;  [facility];  Service: Endoscopy;  Laterality: N/A;  10:00am   endoscopic left fallopian tube removed  several yrs ago   EUS  06/06/2012   Procedure: LOWER ENDOSCOPIC ULTRASOUND (EUS);  Surgeon: Arta Silence,  MD;  [facility];  Service: Endoscopy;  Laterality: N/A;   EXAMINATION UNDER ANESTHESIA  09/07/2012   Procedure: EXAM UNDER ANESTHESIA;  Surgeon: Stark Klein, MD;  Location: [facility];  Service: General;  Laterality: N/A;   EXCISION/RELEASE BURSA HIP  09/15/2011   Dr Tonita Cong EXCISION/RELEASE BURSA HIP;  Surgeon: Johnn Hai, MD;  [facility];  Service: Orthopedics;  Laterality: Left;  Excision of Trochanteric Bursitis   FLEXIBLE SIGMOIDOSCOPY N/A 03/22/2013   Procedure: FLEXIBLE SIGMOIDOSCOPY;  Surgeon: Danie Binder, MD;  [facility];  Service: Endoscopy;  Laterality: N/A;  2:00   LAPAROSCOPIC LOW ANTERIOR RESECTION  07/02/2012   Procedure: LAPAROSCOPIC LOW ANTERIOR RESECTION;  Surgeon: Stark Klein, MD;  [facility];  Service: General;  Laterality: N/A;   left ankle surgery for fx  several yrs ago   LUMBAR LAMINECTOMY/DECOMPRESSION MICRODISCECTOMY N/A 05/14/2014   Procedure: LUMBAR DECOMPRESSION L2-L3;  Surgeon: Johnn Hai, MD;  [facility];  Service: Orthopedics;  Laterality: N/A;   MASS EXCISION Left 02/02/2018   Procedure: EXCISION CYST, LEFT INNER THIGH;  Surgeon: Aviva Signs, MD;  [facility];  Service: General;  Laterality: Left;   PROCTOSCOPY  09/07/2012   Procedure: PROCTOSCOPY;  Surgeon: Stark Klein, MD;  Location: [place];  Service: General;  Laterality: N/A;    Family History  Problem Relation Age of Onset   Other Sister    Migraines Mother    Colon cancer Paternal Aunt    Social History   Tobacco Use   Smoking status: Never   Smokeless tobacco: Never  Vaping Use   Vaping Use: Never used  Substance Use Topics   Alcohol use: Yes    Comment: occasional couple times per mo   Drug use: No    Allergies  Allergen Reactions   Codeine Nausea Only    Confirmed intolerance of codeine verbally with pt in PACU, pt states does not cause any SOB or dyspnea, some nausea only (MN-RN)   Ultram  [Tramadol] Nausea And Vomiting    Ultram allergy also discussed, active N&V occurs with Ultram   Hydrocodone Hives   Latex Itching and Rash    Current Meds  Medication Sig   cyclobenzaprine (FLEXERIL) 10 MG tablet Take 10 mg by mouth 3 (three) times daily as needed.   diclofenac (VOLTAREN) 75 MG EC tablet Take 1 tablet by mouth 2 (two) times daily.   diclofenac Sodium (VOLTAREN) 1 % GEL Apply topically 4 (four) times daily.   escitalopram (LEXAPRO) 10 MG tablet TAKE 1 TABLET BY MOUTH EVERY DAY   esomeprazole (NEXIUM) 40 MG capsule Take 1 capsule (40 mg total) by mouth daily before breakfast. (Needs to be seen before next refill)   estradiol (ESTRACE) 1 MG tablet TAKE 1.5 TABLETS BY MOUTH DAILY.   linaclotide (LINZESS) 290 MCG CAPS capsule TAKE 1 CAPSULE BY MOUTH DAILY  BEFORE BREAKFAST   Semaglutide-Weight Management (WEGOVY) 0.5 MG/0.5ML SOAJ Inject 0.5 mg into the skin once a week.    Objective: BP (!) 139/120   Pulse 73   Ht 5\' 6"  (1.676 m)   Wt 210 lb (95.3 kg)   BMI 33.89 kg/m   Physical Exam:  General: Alert and oriented. and No acute distress. Gait: Normal gait.  Left shoulder without deformity.  No swelling.  No bruising.  Forward flexion limited to 100 degrees.  Internal rotation to the side of her hip.  90 degrees of abduction.  Fingers are warm and well-perfused.  Sensation intact in the axillary nerve distribution.  Sensation intact throughout left hand.  IMAGING: I personally ordered and reviewed the following images   X-rays left shoulder obtained in clinic today.  Near complete loss of joint space within the glenohumeral joint.  Small inferior osteophytes are appreciated in both glenoid, as well as the humeral head.  No evidence of proximal humeral migration.  No acute injuries.  No bony lesions.  Impression: Advanced left glenohumeral joint arthritis   New Medications:  Meds ordered this encounter  Medications   methylPREDNISolone acetate (DEPO-MEDROL)  injection 40 mg      Mordecai Rasmussen, MD  11/16/2022 2:22 PM

## 2022-11-22 ENCOUNTER — Ambulatory Visit (INDEPENDENT_AMBULATORY_CARE_PROVIDER_SITE_OTHER): Payer: 59

## 2022-11-22 ENCOUNTER — Encounter: Payer: Self-pay | Admitting: Family Medicine

## 2022-11-22 ENCOUNTER — Ambulatory Visit: Payer: 59 | Admitting: Family Medicine

## 2022-11-22 VITALS — BP 127/85 | HR 88 | Temp 97.2°F | Ht 66.0 in | Wt 205.8 lb

## 2022-11-22 DIAGNOSIS — K5903 Drug induced constipation: Secondary | ICD-10-CM | POA: Diagnosis not present

## 2022-11-22 DIAGNOSIS — R1010 Upper abdominal pain, unspecified: Secondary | ICD-10-CM

## 2022-11-22 MED ORDER — MAGNESIUM CITRATE PO SOLN: 1.0000 | Freq: Once | ORAL | 0 refills | Status: AC

## 2022-11-22 NOTE — Progress Notes (Signed)
Subjective:  Patient ID: [name] [name], female    DOB: Feb 14, 1966, 57 y.o.   MRN: [id number]  Patient Care Team: Bennie PieriniMartin, Mary-Margaret, FNP as PCP - General (Family Medicine) Maisie FusBranch, Mary E, MD as PCP - Cardiology (Cardiology) West BaliFields, Sandi L, MD (Inactive) (Gastroenterology)   Chief Complaint:  Abdominal Pain (2 weeks) and Constipation   HPI: [name] is a 57 y.o. female presenting on 11/22/2022 for Abdominal Pain (2 weeks) and Constipation   Abdominal Pain This is a new problem. Episode onset: 2 weeks ago. The onset quality is gradual. The problem occurs intermittently. The problem has been waxing and waning. The pain is located in the periumbilical region and suprapubic region. The quality of the pain is aching, cramping, dull and a sensation of fullness. Associated symptoms include constipation. Pertinent negatives include no anorexia, arthralgias, belching, diarrhea, dysuria, fever, flatus, frequency, headaches, hematochezia, hematuria, melena, myalgias, nausea, vomiting or weight loss. Associated symptoms comments: Small bowel movements, worse since being on Wegovy. Nothing aggravates the pain. The pain is relieved by Nothing. Treatments tried: Miralax. The treatment provided no relief.  Constipation The current episode started 1 to 4 weeks ago. Her stool frequency is 1 time per day. The stool is described as firm. The patient is not on a high fiber diet. She Does not exercise regularly. There has Not been adequate water intake. Associated symptoms include abdominal pain and bloating. Pertinent negatives include no anorexia, back pain, diarrhea, difficulty urinating, fecal incontinence, fever, flatus, hematochezia, hemorrhoids, melena, nausea, rectal pain, vomiting or weight loss. Treatments tried: Linzess, Miralax. The treatment provided no relief.       Relevant past medical, surgical, family, and social history reviewed and updated as indicated.  Allergies  and medications reviewed and updated. Data reviewed: Chart in Epic.   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   Closed fracture of left distal fibula 04/27/2018   Constipation    SOME BLOOD IN STOOL   GERD (gastroesophageal reflux disease)    occasionally-NO MEDS   History of kidney stones    Kidney stone    PONV (postoperative nausea and vomiting)    used a scop patch last surgery-was better   Sleep disorder 07/10/2017    Past Surgical History:  Procedure Laterality Date   ABDOMINAL HYSTERECTOMY  1995   partial   BACK SURGERY  2015   COLON SURGERY     COLONOSCOPY  06/01/2012   Procedure: COLONOSCOPY;  Surgeon: West BaliSandi L Fields, MD;  Location: [facility];  Service: Endoscopy;  Laterality: N/A;  1:30PM   COLONOSCOPY N/A 06/14/2013   JYN:WGNFSLF:mild diverticulosis/normal anastomosis   COLONOSCOPY WITH PROPOFOL N/A 02/18/2020   Procedure: COLONOSCOPY WITH PROPOFOL;  Surgeon: Corbin Adeourk, Robert M, MD;  Location: [facility];  Service: Endoscopy;  Laterality: N/A;  10:00am   endoscopic left fallopian tube removed  several yrs ago   EUS  06/06/2012   Procedure: LOWER ENDOSCOPIC ULTRASOUND (EUS);  Surgeon: Willis ModenaWilliam Outlaw, MD;  [facility];  Service: Endoscopy;  Laterality: N/A;   EXAMINATION UNDER ANESTHESIA  09/07/2012   Procedure: EXAM UNDER ANESTHESIA;  Surgeon: Almond LintFaera Byerly, MD;  Location: [facility];  Service: General;  Laterality: N/A;   EXCISION/RELEASE BURSA HIP  09/15/2011   Dr Shelle IronBeane EXCISION/RELEASE BURSA HIP;  Surgeon: Javier DockerJeffrey C Beane, MD;  [facility];  Service: Orthopedics;  Laterality: Left;  Excision of Trochanteric Bursitis   FLEXIBLE SIGMOIDOSCOPY N/A 03/22/2013   Procedure: FLEXIBLE SIGMOIDOSCOPY;  Surgeon: West Bali, MD;  Location: [facility];  Service: Endoscopy;  Laterality: N/A;  2:00   LAPAROSCOPIC LOW ANTERIOR RESECTION  07/02/2012    Procedure: LAPAROSCOPIC LOW ANTERIOR RESECTION;  Surgeon: Almond Lint, MD;  Location: [facility];  Service: General;  Laterality: N/A;   left ankle surgery for fx  several yrs ago   LUMBAR LAMINECTOMY/DECOMPRESSION MICRODISCECTOMY N/A 05/14/2014   Procedure: LUMBAR DECOMPRESSION L2-L3;  Surgeon: Javier Docker, MD;  Location: [facility];  Service: Orthopedics;  Laterality: N/A;   MASS EXCISION Left 02/02/2018   Procedure: EXCISION CYST, LEFT INNER THIGH;  Surgeon: Franky Macho, MD;  Location: [facility];  Service: General;  Laterality: Left;   PROCTOSCOPY  09/07/2012   Procedure: PROCTOSCOPY;  Surgeon: Almond Lint, MD;  Location: [facility];  Service: General;  Laterality: N/A;    Social History   Socioeconomic History   Marital status: Single    Spouse name: Not on file   Number of children: 2   Years of education: Not on file   Highest education level: 12th grade  Occupational History   Occupation: primary Designer, television/film set    Employer: [employer]  Tobacco Use   Smoking status: Never   Smokeless tobacco: Never  Vaping Use   Vaping Use: Never used  Substance and Sexual Activity   Alcohol use: Yes    Comment: occasional couple times per mo   Drug use: No   Sexual activity: Yes    Birth control/protection: Surgical  Other Topics Concern   Not on file  Social History Narrative   Lives alone & 2 kids         Social Determinants of Health   Financial Resource Strain: Low Risk  (11/21/2022)   Overall Financial Resource Strain (CARDIA)    Difficulty of Paying Living Expenses: Not hard at all  Food Insecurity: No Food Insecurity (11/21/2022)   Hunger Vital Sign    Worried About Running Out of Food in the Last Year: Never true    Ran Out of Food in the Last Year: Never true  Transportation Needs: No Transportation Needs (11/21/2022)   PRAPARE - Administrator, Civil Service (Medical): No    Lack of Transportation (Non-Medical): No  Physical Activity:  Insufficiently Active (11/21/2022)   Exercise Vital Sign    Days of Exercise per Week: 3 days    Minutes of Exercise per Session: 20 min  Stress: No Stress Concern Present (11/21/2022)   [employer] of Occupational Health - Occupational Stress Questionnaire    Feeling of Stress : Not at all  Social Connections: Unknown (11/21/2022)   Social Connection and Isolation Panel [NHANES]    Frequency of Communication with Friends and Family: More than three times a week    Frequency of Social Gatherings with Friends and Family: More than three times a week    Attends Religious Services: Patient declined    Database administrator or Organizations: No    Attends Banker Meetings: Not on file    Marital Status: Never married  Intimate Partner Violence: Not on file    Outpatient Encounter Medications as of 11/22/2022  Medication Sig   cyclobenzaprine (FLEXERIL) 10 MG tablet Take 10 mg by mouth 3 (three) times daily as needed.   diclofenac (VOLTAREN) 75 MG EC tablet Take 1 tablet  by mouth 2 (two) times daily.   diclofenac Sodium (VOLTAREN) 1 % GEL Apply topically 4 (four) times daily.   escitalopram (LEXAPRO) 10 MG tablet TAKE 1 TABLET BY MOUTH EVERY DAY   esomeprazole (NEXIUM) 40 MG capsule Take 1 capsule (40 mg total) by mouth daily before breakfast. (Needs to be seen before next refill)   estradiol (ESTRACE) 1 MG tablet TAKE 1.5 TABLETS BY MOUTH DAILY.   linaclotide (LINZESS) 290 MCG CAPS capsule TAKE 1 CAPSULE BY MOUTH DAILY BEFORE BREAKFAST   magnesium citrate SOLN Take 296 mLs (1 Bottle total) by mouth once for 1 dose.   Semaglutide-Weight Management (WEGOVY) 0.5 MG/0.5ML SOAJ Inject 0.5 mg into the skin once a week.   diltiazem (CARDIZEM) 30 MG tablet Take 1 tablet (30 mg total) by mouth 4 (four) times daily as needed. (Patient not taking: Reported on 11/22/2022)   No facility-administered encounter medications on file as of 11/22/2022.    Allergies  Allergen Reactions   Codeine  Nausea Only    Confirmed intolerance of codeine verbally with pt in PACU, pt states does not cause any SOB or dyspnea, some nausea only (MN-RN)   Ultram [Tramadol] Nausea And Vomiting    Ultram allergy also discussed, active N&V occurs with Ultram   Hydrocodone Hives   Latex Itching and Rash    Review of Systems  Constitutional:  Positive for appetite change. Negative for activity change, chills, diaphoresis, fatigue, fever, unexpected weight change and weight loss.  Eyes:  Negative for photophobia and visual disturbance.  Respiratory:  Negative for cough and shortness of breath.   Cardiovascular:  Negative for chest pain, palpitations and leg swelling.  Gastrointestinal:  Positive for abdominal distention, abdominal pain, bloating and constipation. Negative for anal bleeding, anorexia, blood in stool, diarrhea, flatus, hematochezia, hemorrhoids, melena, nausea, rectal pain and vomiting.  Genitourinary:  Negative for decreased urine volume, difficulty urinating, dysuria, frequency and hematuria.  Musculoskeletal:  Negative for arthralgias, back pain and myalgias.  Neurological:  Negative for dizziness, tremors, seizures, syncope, facial asymmetry, speech difficulty, weakness, light-headedness, numbness and headaches.  Psychiatric/Behavioral:  Negative for confusion.   All other systems reviewed and are negative.       Objective:  BP 127/85   Pulse 88   Temp (!) 97.2 F (36.2 C) (Temporal)   Ht 5\' 6"  (1.676 m)   Wt 205 lb 12.8 oz (93.4 kg)   SpO2 97%   BMI 33.22 kg/m    Wt Readings from Last 3 Encounters:  11/22/22 205 lb 12.8 oz (93.4 kg)  11/15/22 210 lb (95.3 kg)  10/03/22 219 lb (99.3 kg)    Physical Exam Vitals and nursing note reviewed.  Constitutional:      General: She is not in acute distress.    Appearance: Normal appearance. She is well-developed and well-groomed. She is obese. She is not ill-appearing, toxic-appearing or diaphoretic.  HENT:     Head:  Normocephalic and atraumatic.     Jaw: There is normal jaw occlusion.     Right Ear: Hearing normal.     Left Ear: Hearing normal.     Nose: Nose normal.     Mouth/Throat:     Lips: Pink.     Mouth: Mucous membranes are moist.     Pharynx: Oropharynx is clear. Uvula midline.  Eyes:     General: Lids are normal.     Extraocular Movements: Extraocular movements intact.     Conjunctiva/sclera: Conjunctivae normal.     Pupils: Pupils  are equal, round, and reactive to light.  Neck:     Thyroid: No thyroid mass, thyromegaly or thyroid tenderness.     Vascular: No carotid bruit or JVD.     Trachea: Trachea and phonation normal.  Cardiovascular:     Rate and Rhythm: Normal rate and regular rhythm.     Chest Wall: PMI is not displaced.     Pulses: Normal pulses.     Heart sounds: Normal heart sounds. No murmur heard.    No friction rub. No gallop.  Pulmonary:     Effort: Pulmonary effort is normal. No respiratory distress.     Breath sounds: Normal breath sounds. No wheezing.  Abdominal:     General: Bowel sounds are normal. There is no distension or abdominal bruit.     Palpations: Abdomen is soft. There is no hepatomegaly, splenomegaly or mass.     Tenderness: There is generalized abdominal tenderness. There is no right CVA tenderness, left CVA tenderness, guarding or rebound. Negative signs include Murphy's sign, Rovsing's sign, McBurney's sign, psoas sign and obturator sign.     Hernia: No hernia is present.  Musculoskeletal:        General: Normal range of motion.     Cervical back: Normal range of motion and neck supple.     Right lower leg: No edema.     Left lower leg: No edema.  Lymphadenopathy:     Cervical: No cervical adenopathy.  Skin:    General: Skin is warm and dry.     Capillary Refill: Capillary refill takes less than 2 seconds.     Coloration: Skin is not cyanotic, jaundiced or pale.     Findings: No rash.  Neurological:     General: No focal deficit present.      Mental Status: She is alert and oriented to person, place, and time.     Sensory: Sensation is intact.     Motor: Motor function is intact.     Coordination: Coordination is intact.     Gait: Gait is intact.     Deep Tendon Reflexes: Reflexes are normal and symmetric.  Psychiatric:        Attention and Perception: Attention and perception normal.        Mood and Affect: Mood and affect normal.        Speech: Speech normal.        Behavior: Behavior normal. Behavior is cooperative.        Thought Content: Thought content normal.        Cognition and Memory: Cognition and memory normal.        Judgment: Judgment normal.     Results for orders placed or performed in visit on 10/03/22  Urine Culture   Specimen: Urine   UR  Result Value Ref Range   Urine Culture, Routine Final report    Organism ID, Bacteria Comment   Microscopic Examination   Urine  Result Value Ref Range   WBC, UA None seen 0 - 5 /hpf   RBC, Urine 0-2 0 - 2 /hpf   Epithelial Cells (non renal) 0-10 0 - 10 /hpf   Renal Epithel, UA None seen None seen /hpf   Mucus, UA Present (A) Not Estab.   Bacteria, UA Few (A) None seen/Few  Urinalysis, Complete  Result Value Ref Range   Specific Gravity, UA >1.030 (H) 1.005 - 1.030   pH, UA 5.5 5.0 - 7.5   Color, UA Yellow Yellow   Appearance  Ur Clear Clear   Leukocytes,UA Negative Negative   Protein,UA Trace (A) Negative/Trace   Glucose, UA Negative Negative   Ketones, UA Negative Negative   RBC, UA Trace (A) Negative   Bilirubin, UA Negative Negative   Urobilinogen, Ur 0.2 0.2 - 1.0 mg/dL   Nitrite, UA Negative Negative   Microscopic Examination See below:      X-Ray: KUB: significant stool burden throughout, no obvious obstruction. Preliminary x-ray reading by Kari Baars, FNP-C, [facility].   Pertinent labs & imaging results that were available during my care of the patient were reviewed by me and considered in my medical decision making.  Assessment & Plan:   [place] was seen today for abdominal pain and constipation.  Diagnoses and all orders for this visit:  Pain of upper abdomen Significant stool burden on KUB without indication of obstruction.  -     DG Abd 1 View; Future  Drug-induced constipation Likely due to [facility]. Discussed high fiber diet and increased water intake. Will do Mag Citrate and Miralax clean out. Aware to make follow up with GI. Red flags discussed in detail. Aware to report new, worsening, or persistent symptoms.  -     magnesium citrate SOLN; Take 296 mLs (1 Bottle total) by mouth once for 1 dose.     Continue all other maintenance medications.  Follow up plan: Return if symptoms worsen or fail to improve.   Continue healthy lifestyle choices, including diet (rich in fruits, vegetables, and lean proteins, and low in salt and simple carbohydrates) and exercise (at least 30 minutes of moderate physical activity daily).  Educational handout given for Miralax clean out  The above assessment and management plan was discussed with the patient. The patient verbalized understanding of and has agreed to the management plan. Patient is aware to call the clinic if they develop any new symptoms or if symptoms persist or worsen. Patient is aware when to return to the clinic for a follow-up visit. Patient educated on when it is appropriate to go to the emergency department.   Kari Baars, FNP-C [facility] [phone]

## 2022-11-22 NOTE — Patient Instructions (Signed)
1. Your symptoms are consistent with Constipation, likely cause of your General Abdominal Pain / Cramping. 2. Start with Miralax, prescription was sent to pharmacy. First dose 68g (4 capfuls) in 32oz water over 1 to 2 hours for clean out. Next day start 17g or 1 capful daily, may adjust dose up or down by half a capful every few days. Recommend to take this medicine daily for next 1-2 weeks, you may need to use it longer if needed. - Goal is to have soft regular bowel movement 1-3x daily, if too runny or diarrhea, then reduce dose of the medicine to every other day.  Improve water intake, hydration will help Also recommend increased vegetables, fruits, fiber intake Can try daily Metamucil or Fiber supplement at pharmacy over the counter  Follow-up if symptoms are not improving with bowel movements, or if pain worsens, develop fevers, nausea, vomiting.  Please schedule a follow-up appointment with Lisa Baars, FNP, in 1 month to follow-up Constipation  If you have any other questions or concerns, please feel free to call the clinic to contact me. You may also schedule an earlier appointment if necessary.  However, if your symptoms get significantly worse, please go to the Emergency Department to seek immediate medical attention.

## 2022-12-04 NOTE — Progress Notes (Unsigned)
GI Office Note    Referring Provider: Bennie Pierini, * Primary Care Physician:  Bennie Pierini, FNP  Primary Gastroenterologist: formerly Dr. Darrick Penna  Chief Complaint   No chief complaint on file.   History of Present Illness   [name] is a 57 y.o. female presenting today for ***. Last seen 11/2019 for constipation. H/o rectal cancer in 2013 s/p LAR.    Linzess in the past but not regularly.  Miralax and fiberchoice  Abd 1 view 11/2022: stool throughout colon  Last colonoscopy 02/2020: -diverticulosis -surgical anastomosis at 2cm from anal verge -repeat colonoscopy in five year.  Medications   Current Outpatient Medications  Medication Sig Dispense Refill   cyclobenzaprine (FLEXERIL) 10 MG tablet Take 10 mg by mouth 3 (three) times daily as needed.     diclofenac (VOLTAREN) 75 MG EC tablet Take 1 tablet by mouth 2 (two) times daily.     diclofenac Sodium (VOLTAREN) 1 % GEL Apply topically 4 (four) times daily.     diltiazem (CARDIZEM) 30 MG tablet Take 1 tablet (30 mg total) by mouth 4 (four) times daily as needed. (Patient not taking: Reported on 11/22/2022) 360 tablet 1   escitalopram (LEXAPRO) 10 MG tablet TAKE 1 TABLET BY MOUTH EVERY DAY 90 tablet 1   esomeprazole (NEXIUM) 40 MG capsule Take 1 capsule (40 mg total) by mouth daily before breakfast. (Needs to be seen before next refill) 90 capsule 3   estradiol (ESTRACE) 1 MG tablet TAKE 1.5 TABLETS BY MOUTH DAILY. 135 tablet 1   linaclotide (LINZESS) 290 MCG CAPS capsule TAKE 1 CAPSULE BY MOUTH DAILY BEFORE BREAKFAST 90 capsule 3   Semaglutide-Weight Management (WEGOVY) 0.5 MG/0.5ML SOAJ Inject 0.5 mg into the skin once a week. 2 mL 2   No current facility-administered medications for this visit.    Allergies   Allergies as of 12/05/2022 - Review Complete 11/22/2022  Allergen Reaction Noted   Codeine Nausea Only 12/15/2010   Ultram [tramadol] Nausea And Vomiting 05/25/2012    Hydrocodone Hives 11/15/2022   Latex Itching and Rash 07/03/2012     Past Medical History   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   Closed fracture of left distal fibula 04/27/2018   Constipation    SOME BLOOD IN STOOL   GERD (gastroesophageal reflux disease)    occasionally-NO MEDS   History of kidney stones    Kidney stone    PONV (postoperative nausea and vomiting)    used a scop patch last surgery-was better   Sleep disorder 07/10/2017    Past Surgical History   Past Surgical History:  Procedure Laterality Date   ABDOMINAL HYSTERECTOMY  1995   partial   BACK SURGERY  2015   COLON SURGERY     COLONOSCOPY  06/01/2012   Procedure: COLONOSCOPY;  Surgeon: West Bali, MD;  Location: [facility];  Service: Endoscopy;  Laterality: N/A;  1:30PM   COLONOSCOPY N/A 06/14/2013   KXF:GHWE diverticulosis/normal anastomosis   COLONOSCOPY WITH PROPOFOL N/A 02/18/2020   Procedure: COLONOSCOPY WITH PROPOFOL;  Surgeon: Corbin Ade, MD;  Location: [facility];  Service: Endoscopy;  Laterality: N/A;  10:00am   endoscopic left fallopian tube removed  several yrs ago   EUS  06/06/2012   Procedure: LOWER ENDOSCOPIC ULTRASOUND (EUS);  Surgeon: Willis Modena, MD;  Location:  [facility];  Service: Endoscopy;  Laterality: N/A;   EXAMINATION UNDER ANESTHESIA  09/07/2012   Procedure: EXAM UNDER ANESTHESIA;  Surgeon: Almond Lint, MD;  Location: [facility];  Service: General;  Laterality: N/A;   EXCISION/RELEASE BURSA HIP  09/15/2011   Dr Shelle Iron EXCISION/RELEASE BURSA HIP;  Surgeon: Javier Docker, MD;  Location: [facility];  Service: Orthopedics;  Laterality: Left;  Excision of Trochanteric Bursitis   FLEXIBLE SIGMOIDOSCOPY N/A 03/22/2013   Procedure: FLEXIBLE SIGMOIDOSCOPY;  Surgeon: West Bali, MD;  Location: [facility];  Service: Endoscopy;  Laterality: N/A;  2:00    LAPAROSCOPIC LOW ANTERIOR RESECTION  07/02/2012   Procedure: LAPAROSCOPIC LOW ANTERIOR RESECTION;  Surgeon: Almond Lint, MD;  Location: [facility];  Service: General;  Laterality: N/A;   left ankle surgery for fx  several yrs ago   LUMBAR LAMINECTOMY/DECOMPRESSION MICRODISCECTOMY N/A 05/14/2014   Procedure: LUMBAR DECOMPRESSION L2-L3;  Surgeon: Javier Docker, MD;  Location: [facility];  Service: Orthopedics;  Laterality: N/A;   MASS EXCISION Left 02/02/2018   Procedure: EXCISION CYST, LEFT INNER THIGH;  Surgeon: Franky Macho, MD;  Location: [facility];  Service: General;  Laterality: Left;   PROCTOSCOPY  09/07/2012   Procedure: PROCTOSCOPY;  Surgeon: Almond Lint, MD;  Location: [facility];  Service: General;  Laterality: N/A;    Past Family History   Family History  Problem Relation Age of Onset   Other Sister    Migraines Mother    Colon cancer Paternal Aunt     Past Social History   Social History   Socioeconomic History   Marital status: Single    Spouse name: Not on file   Number of children: 2   Years of education: Not on file   Highest education level: 12th grade  Occupational History   Occupation: Psychologist, educational    Employer: [employer]  Tobacco Use   Smoking status: Never   Smokeless tobacco: Never  Vaping Use   Vaping Use: Never used  Substance and Sexual Activity   Alcohol use: Yes    Comment: occasional couple times per mo   Drug use: No   Sexual activity: Yes    Birth control/protection: Surgical  Other Topics Concern   Not on file  Social History Narrative   Lives alone & 2 kids         Social Determinants of Health   Financial Resource Strain: Low Risk  (11/21/2022)   Overall Financial Resource Strain (CARDIA)    Difficulty of Paying Living Expenses: Not hard at all  Food Insecurity: No Food Insecurity (11/21/2022)   Hunger Vital Sign    Worried About Running Out of Food in the Last Year: Never true    Ran Out of Food in the Last  Year: Never true  Transportation Needs: No Transportation Needs (11/21/2022)   PRAPARE - Administrator, Civil Service (Medical): No    Lack of Transportation (Non-Medical): No  Physical Activity: Insufficiently Active (11/21/2022)   Exercise Vital Sign    Days of Exercise per Week: 3 days    Minutes of Exercise per Session: 20 min  Stress: No Stress Concern Present (11/21/2022)   [employer] of Occupational Health - Occupational Stress Questionnaire    Feeling of Stress : Not at all  Social Connections: Unknown (11/21/2022)   Social Connection and Isolation Panel [NHANES]    Frequency of Communication with Friends and Family: More than three times a week  Frequency of Social Gatherings with Friends and Family: More than three times a week    Attends Religious Services: Patient declined    Active Member of Clubs or Organizations: No    Attends Engineer, structural: Not on file    Marital Status: Never married  Intimate Partner Violence: Not on file    Review of Systems   General: Negative for anorexia, weight loss, fever, chills, fatigue, weakness. ENT: Negative for hoarseness, difficulty swallowing , nasal congestion. CV: Negative for chest pain, angina, palpitations, dyspnea on exertion, peripheral edema.  Respiratory: Negative for dyspnea at rest, dyspnea on exertion, cough, sputum, wheezing.  GI: See history of present illness. GU:  Negative for dysuria, hematuria, urinary incontinence, urinary frequency, nocturnal urination.  Endo: Negative for unusual weight change.     Physical Exam   There were no vitals taken for this visit.   General: Well-nourished, well-developed in no acute distress.  Eyes: No icterus. Mouth: Oropharyngeal mucosa moist and pink , no lesions erythema or exudate. Lungs: Clear to auscultation bilaterally.  Heart: Regular rate and rhythm, no murmurs rubs or gallops.  Abdomen: Bowel sounds are normal, nontender, nondistended, no  hepatosplenomegaly or masses,  no abdominal bruits or hernia , no rebound or guarding.  Rectal: ***  Extremities: No lower extremity edema. No clubbing or deformities. Neuro: Alert and oriented x 4   Skin: Warm and dry, no jaundice.   Psych: Alert and cooperative, normal mood and affect.  Labs   Lab Results  Component Value Date   CREATININE 0.79 09/09/2022   BUN 18 09/09/2022   NA 138 09/09/2022   K 3.6 09/09/2022   CL 105 09/09/2022   CO2 24 09/09/2022   Lab Results  Component Value Date   WBC 7.6 09/09/2022   HGB 13.8 09/09/2022   HCT 41.9 09/09/2022   MCV 92.5 09/09/2022   PLT 240 09/09/2022   Lab Results  Component Value Date   ALT 21 06/23/2022   AST 22 06/23/2022   ALKPHOS 74 06/23/2022   BILITOT 0.2 06/23/2022    Imaging Studies   DG Abd 1 View  Result Date: 11/23/2022 CLINICAL DATA:  Abdominal pain EXAM: ABDOMEN - 1 VIEW COMPARISON:  None Available. FINDINGS: Stool throughout the colon. Paucity of small bowel gas. No free intraperitoneal air. Pelvic phleboliths. Bilateral hip joint degenerative changes. Lower lumbar spine degenerative changes. IMPRESSION: Stool throughout the colon as can be seen with constipation. Electronically Signed   By: Annia Belt M.D.   On: 11/23/2022 06:04   DG Shoulder Left  Result Date: 11/19/2022 X-rays left shoulder obtained in clinic today.  Near complete loss of joint space within the glenohumeral joint.  Small inferior osteophytes are appreciated in both glenoid, as well as the humeral head.  No evidence of proximal humeral migration.  No acute injuries.  No bony lesions.  Impression: Advanced left glenohumeral joint arthritis    Assessment       PLAN   ***   Leanna Battles. Melvyn Neth, MHS, PA-C [facility]

## 2022-12-05 ENCOUNTER — Encounter: Payer: Self-pay | Admitting: Gastroenterology

## 2022-12-05 ENCOUNTER — Ambulatory Visit: Payer: 59 | Admitting: Gastroenterology

## 2022-12-05 VITALS — BP 106/73 | HR 84 | Temp 97.3°F | Ht 66.0 in | Wt 206.4 lb

## 2022-12-05 DIAGNOSIS — K5904 Chronic idiopathic constipation: Secondary | ICD-10-CM

## 2022-12-05 DIAGNOSIS — K219 Gastro-esophageal reflux disease without esophagitis: Secondary | ICD-10-CM

## 2022-12-05 MED ORDER — ESOMEPRAZOLE MAGNESIUM 40 MG PO CPDR: 40.0000 mg | DELAYED_RELEASE_CAPSULE | Freq: Two times a day (BID) | ORAL | 1 refills | Status: DC

## 2022-12-05 MED ORDER — LUBIPROSTONE 24 MCG PO CAPS: 24.0000 ug | ORAL_CAPSULE | Freq: Two times a day (BID) | ORAL | 1 refills | Status: DC

## 2022-12-05 NOTE — Patient Instructions (Signed)
Continue MiraLAX 1-2 capfuls daily. Add Amitiza 24 mcg once to twice daily with food for constipation.  Consider starting once daily after work until you determine how quickly medication works for you.  Over the next couple of weeks you will be able to determine whether or not this medication will be effective for you.  Please let me know if you are not having improvement in your bowel function.   Continue to add fiber to your diet.  Drink plenty of fluids at least 8 glasses of water daily.  Try to be physically active, walking 20 to 30 minutes consistently without stopping at least 5 days/week. Prescription for Nexium sent to your pharmacy, take once to twice daily to control reflux. Return to the office in 3 months.  If you are not feeling better please call sooner.

## 2022-12-19 ENCOUNTER — Ambulatory Visit: Payer: 59 | Admitting: Gastroenterology

## 2023-01-01 ENCOUNTER — Other Ambulatory Visit: Payer: Self-pay | Admitting: Nurse Practitioner

## 2023-01-01 DIAGNOSIS — F410 Panic disorder [episodic paroxysmal anxiety] without agoraphobia: Secondary | ICD-10-CM

## 2023-01-04 ENCOUNTER — Other Ambulatory Visit: Payer: Self-pay | Admitting: Gastroenterology

## 2023-01-04 MED ORDER — MOTEGRITY 2 MG PO TABS: 2.0000 mg | ORAL_TABLET | Freq: Every day | ORAL | 1 refills | Status: DC

## 2023-01-18 ENCOUNTER — Ambulatory Visit: Payer: 59 | Admitting: Orthopedic Surgery

## 2023-01-18 ENCOUNTER — Other Ambulatory Visit (INDEPENDENT_AMBULATORY_CARE_PROVIDER_SITE_OTHER): Payer: 59

## 2023-01-18 ENCOUNTER — Encounter: Payer: Self-pay | Admitting: Orthopedic Surgery

## 2023-01-18 VITALS — BP 138/85 | HR 80 | Ht 66.0 in | Wt 200.0 lb

## 2023-01-18 DIAGNOSIS — M7702 Medial epicondylitis, left elbow: Secondary | ICD-10-CM | POA: Diagnosis not present

## 2023-01-18 DIAGNOSIS — M25522 Pain in left elbow: Secondary | ICD-10-CM

## 2023-01-18 NOTE — Patient Instructions (Signed)

## 2023-01-18 NOTE — Progress Notes (Signed)
Orthopaedic Clinic Return  Assessment: Tamlyn Pacitti is a 57 y.o. female with the following: Left elbow medial epicondylitis   Plan: Mrs. Cirrincione has exquisite pain, tenderness and some swelling over the medial epicondyle of the left elbow.  This has been ongoing for several months.  It is progressively worsening.  No specific injury.  She uses Voltaren gel, as well as Voltaren p.o.  Radiographs obtained in clinic today are negative for acute injury.  Presentation most consistent with medial epicondylitis.  Pain is severe.  She would like to proceed with an injection.  This was completed in clinic today without issues.  Procedure note injection Left medial epicondyle   Verbal consent was obtained to inject the left elbow, medial epicondyle Timeout was completed to confirm the site of injection.  The skin was prepped with alcohol and ethyl chloride was sprayed at the injection site.  A 21-gauge needle was used to inject 40 mg of Depo-Medrol and 1% lidocaine (1 cc) into the medial epicondyle of the left elbow using a direct medial approach.  There were no complications. A sterile bandage was applied.  Follow-up: Return if symptoms worsen or fail to improve.   Subjective:  Chief Complaint  Patient presents with   Elbow Pain    Left medial elbow painful severe pain, with motion     History of Present Illness: Anslee Bobek is a 57 y.o. female who returns to clinic for evaluation of left elbow pain.  She states that she has had pain in the medial elbow for several months.  In fact, when I saw her for her left shoulder, she was having pain in this area.  She does have a history of cubital tunnel release, as well as carpal tunnel release.  No specific injury, but she has had tenderness to palpation over the medial epicondyle for several months.  It has been limiting her ability and function.  Medications have not been effective.  She takes Voltaren, and uses Voltaren gel.   No prior injections to the left elbow.  Review of Systems: No fevers or chills No numbness or tingling No chest pain No shortness of breath No bowel or bladder dysfunction No GI distress No headaches   Objective: BP 138/85   Pulse 80   Ht 5\' 6"  (1.676 m)   Wt 200 lb (90.7 kg)   BMI 32.28 kg/m   Physical Exam:  Evaluation of the left elbow demonstrates no bruising.  No swelling.  She has tenderness to palpation at the medial epicondyle.  No bruising this area.  Pain is recreated with resisted wrist flexion.  Fingers are warm and well-perfused.  She has intact sensation to the small finger.  Well-healed medial elbow and volar palm surgical incisions.  IMAGING: I personally ordered and reviewed the following images:  X-rays left elbow were obtained in clinic today.  No acute injuries noted.  Minimal degenerative changes.  No dislocation.  Good overall bone quality.  No bony lesions.  Impression: Negative left elbow x-ray.   Oliver Barre, MD 01/18/2023 4:34 PM

## 2023-01-20 ENCOUNTER — Telehealth: Payer: Self-pay | Admitting: Nurse Practitioner

## 2023-01-20 ENCOUNTER — Other Ambulatory Visit: Payer: Self-pay | Admitting: Nurse Practitioner

## 2023-01-20 MED ORDER — WEGOVY 1 MG/0.5ML ~~LOC~~ SOAJ: 1.0000 mg | SUBCUTANEOUS | 2 refills | Status: DC

## 2023-01-20 NOTE — Telephone Encounter (Signed)
Patient notified and verbalized understanding. 

## 2023-01-20 NOTE — Telephone Encounter (Signed)
Pt says she has about 2 weeks left of her Wegovy and wants to know if MMM can increase her dosage because she has been on the 0.5 for the past 2-3 mths?

## 2023-01-20 NOTE — Telephone Encounter (Signed)
New prescription for increase dose of wegovy sent to pharmacy

## 2023-01-20 NOTE — Telephone Encounter (Signed)
Please review and advise.

## 2023-01-22 ENCOUNTER — Other Ambulatory Visit: Payer: Self-pay | Admitting: Nurse Practitioner

## 2023-01-25 ENCOUNTER — Encounter: Payer: Self-pay | Admitting: Gastroenterology

## 2023-01-29 ENCOUNTER — Other Ambulatory Visit: Payer: Self-pay | Admitting: Nurse Practitioner

## 2023-01-29 DIAGNOSIS — F410 Panic disorder [episodic paroxysmal anxiety] without agoraphobia: Secondary | ICD-10-CM

## 2023-01-30 NOTE — Telephone Encounter (Signed)
Appt scheduled for 02/17/23

## 2023-01-30 NOTE — Telephone Encounter (Signed)
MMM NTBS 30 days given 01/02/23

## 2023-02-02 ENCOUNTER — Other Ambulatory Visit: Payer: Self-pay | Admitting: Nurse Practitioner

## 2023-02-02 DIAGNOSIS — F410 Panic disorder [episodic paroxysmal anxiety] without agoraphobia: Secondary | ICD-10-CM

## 2023-02-14 ENCOUNTER — Other Ambulatory Visit: Payer: Self-pay | Admitting: Nurse Practitioner

## 2023-02-15 ENCOUNTER — Ambulatory Visit (INDEPENDENT_AMBULATORY_CARE_PROVIDER_SITE_OTHER): Payer: 59 | Admitting: Orthopedic Surgery

## 2023-02-15 ENCOUNTER — Encounter: Payer: Self-pay | Admitting: Orthopedic Surgery

## 2023-02-15 VITALS — BP 116/80 | HR 90 | Ht 66.0 in | Wt 197.0 lb

## 2023-02-15 DIAGNOSIS — M19012 Primary osteoarthritis, left shoulder: Secondary | ICD-10-CM | POA: Diagnosis not present

## 2023-02-15 NOTE — Progress Notes (Signed)
Return Patient Visit  Assessment: [name] is a 57 y.o. female with the following: 1.  Left shoulder glenohumeral joint arthritis   Plan: [name] continues to have pain and limited function of the left shoulder. She has advanced degenerative changes within the left glenohumeral joint.  Prior injection provided some improvement in her pain.  She would like to proceed with another injection.  This was completed today without issues.  We briefly discussed further treatment options, and she is not interested in surgery.  Continue with medications as needed.  She will contact the clinic if she has issues.  Procedure note injection - Left shoulder, ultrasound guidance   Verbal consent was obtained to inject the Left shoulder, glenohumeral joint  Timeout was completed to confirm the site of injection.   Using the ultrasound, the rotator cuff tendons were identified.  The joint space was also identified. The skin was prepped with alcohol and ethyl chloride was sprayed at the injection site.  A 21-gauge needle was used to inject 40 mg of Depo-Medrol and 1% lidocaine (4 cc) into the glenohumeral joint space of the Left shoulder using a posterolateral approach.  The needle was visualized entering the glenohumeral joint, and the medication was also visualized. There were no complications.  A sterile bandage was applied.   Note: In order to accurately identify the placement of the needle, ultrasound was required, to increase the accuracy, and specificity of the injection.   Follow-up: Return if symptoms worsen or fail to improve.  Subjective:  Chief Complaint  Patient presents with   Injections    L Shoulder     History of Present Illness: [name] is a 57 y.o. female who returns for evaluation of left shoulder pain.  She continues to have limited function and severe pain in the left shoulder.  She has known glenohumeral arthritis.  Prior injection  provided some relief of her pain for a couple of months.  No specific injury.  Pain is deep within the joint.  She avoids exercises and activities that worsen the pain.  She gets some extra assistance at work from her colleagues.   Review of Systems: No fevers or chills No numbness or tingling No chest pain No shortness of breath No bowel or bladder dysfunction No GI distress No headaches    Objective: BP 116/80   Pulse 90   Ht 5\' 6"  (1.676 m)   Wt 197 lb (89.4 kg)   BMI 31.80 kg/m   Physical Exam:  General: Alert and oriented. and No acute distress. Gait: Normal gait.  Left shoulder without deformity.  No swelling.  No bruising.  Forward flexion limited to 100 degrees.  Internal rotation to the side of her hip.  90 degrees of abduction.  Fingers are warm and well-perfused.  Sensation intact in the axillary nerve distribution.  Sensation intact throughout left hand.  IMAGING: No new imaging obtained today   New Medications:  No orders of the defined types were placed in this encounter.     Oliver Barre, MD  02/15/2023 4:46 PM

## 2023-02-15 NOTE — Patient Instructions (Signed)

## 2023-02-17 ENCOUNTER — Encounter: Payer: Self-pay | Admitting: Nurse Practitioner

## 2023-02-17 ENCOUNTER — Ambulatory Visit: Payer: 59 | Admitting: Nurse Practitioner

## 2023-02-17 VITALS — BP 113/78 | HR 84 | Temp 96.3°F | Resp 20 | Ht 66.0 in | Wt 195.0 lb

## 2023-02-17 DIAGNOSIS — Z6831 Body mass index (BMI) 31.0-31.9, adult: Secondary | ICD-10-CM | POA: Insufficient documentation

## 2023-02-17 DIAGNOSIS — B001 Herpesviral vesicular dermatitis: Secondary | ICD-10-CM

## 2023-02-17 DIAGNOSIS — K5900 Constipation, unspecified: Secondary | ICD-10-CM

## 2023-02-17 DIAGNOSIS — K219 Gastro-esophageal reflux disease without esophagitis: Secondary | ICD-10-CM | POA: Diagnosis not present

## 2023-02-17 DIAGNOSIS — G43009 Migraine without aura, not intractable, without status migrainosus: Secondary | ICD-10-CM

## 2023-02-17 DIAGNOSIS — F411 Generalized anxiety disorder: Secondary | ICD-10-CM

## 2023-02-17 DIAGNOSIS — N898 Other specified noninflammatory disorders of vagina: Secondary | ICD-10-CM

## 2023-02-17 DIAGNOSIS — G479 Sleep disorder, unspecified: Secondary | ICD-10-CM

## 2023-02-17 LAB — URINALYSIS, COMPLETE
Bilirubin, UA: NEGATIVE
Glucose, UA: NEGATIVE
Ketones, UA: NEGATIVE
Leukocytes,UA: NEGATIVE
Nitrite, UA: NEGATIVE
Protein,UA: NEGATIVE
Specific Gravity, UA: >1.03 — ABNORMAL HIGH (ref 1.005–1.030)
Urobilinogen, Ur: 0.2 mg/dL (ref 0.2–1.0)
pH, UA: 6 (ref 5.0–7.5)

## 2023-02-17 LAB — MICROSCOPIC EXAMINATION
RBC, Urine: NONE SEEN /hpf (ref 0–2)
Renal Epithel, UA: NONE SEEN /hpf

## 2023-02-17 MED ORDER — ESOMEPRAZOLE MAGNESIUM 40 MG PO CPDR
40.0000 mg | DELAYED_RELEASE_CAPSULE | Freq: Two times a day (BID) | ORAL | 1 refills | Status: DC
Start: 2023-02-17 — End: 2023-09-11

## 2023-02-17 MED ORDER — VALACYCLOVIR HCL 1 G PO TABS
ORAL_TABLET | ORAL | 1 refills | Status: DC
Start: 2023-02-17 — End: 2023-08-29

## 2023-02-17 MED ORDER — ESCITALOPRAM OXALATE 10 MG PO TABS
10.0000 mg | ORAL_TABLET | Freq: Every day | ORAL | 0 refills | Status: DC
Start: 2023-02-17 — End: 2023-03-06

## 2023-02-17 NOTE — Progress Notes (Signed)
Subjective:    Patient ID: [name], female    DOB: 1966/04/10, 57 y.o.   MRN: [id number]   Chief Complaint: medical management of chronic issues     HPI:  [name] is a 57 y.o. who identifies as a female who was assigned female at birth.   Social history: Lives with: husband Work history:    Comes in today for follow up of the following chronic medical issues:  1. Gastroesophageal reflux disease, unspecified whether esophagitis present Is on nexium and is working well.  2. Migraine without aura and without status migrainosus, not intractable Have not had in several years.  3. Anxiety state Is on lexapro which seems to help her    02/17/2023    3:17 PM 10/03/2022    3:44 PM 06/23/2022    8:24 AM 08/31/2021   11:27 AM  GAD 7 : Generalized Anxiety Score  Nervous, Anxious, on Edge 0 1 0 1  Control/stop worrying 0 0 0 0  Worry too much - different things 0 0 0 0  Trouble relaxing 0 1 0 0  Restless 0 0 0 0  Easily annoyed or irritable 0 0 0 0  Afraid - awful might happen 0 0 0 0  Total GAD 7 Score 0 2 0 1  Anxiety Difficulty Not difficult at all Not difficult at all Not difficult at all Not difficult at all        4. Constipation, unspecified constipation type She still has and has to take meds to have bowel movementt. She is going to follow up with GI  5. Sleep disorder No issues as of late.  6. BMI 31.0-31.9,adult Has been on wegovy for weight loss. Weight is down Wt Readings from Last 3 Encounters:  02/17/23 195 lb (88.5 kg)  02/15/23 197 lb (89.4 kg)  01/18/23 200 lb (90.7 kg)   BMI Readings from Last 3 Encounters:  02/17/23 31.47 kg/m  02/15/23 31.80 kg/m  01/18/23 32.28 kg/m      New complaints: None today  Allergies  Allergen Reactions   Codeine Nausea Only    Confirmed intolerance of codeine verbally with pt in PACU, pt states does not cause any SOB or dyspnea, some nausea only (MN-RN)   Ultram [Tramadol]  Nausea And Vomiting    Ultram allergy also discussed, active N&V occurs with Ultram   Hydrocodone Hives   Latex Itching and Rash   Outpatient Encounter Medications as of 02/17/2023  Medication Sig   cyclobenzaprine (FLEXERIL) 10 MG tablet Take 10 mg by mouth 3 (three) times daily as needed.   diclofenac (VOLTAREN) 75 MG EC tablet Take 1 tablet by mouth 2 (two) times daily.   diclofenac Sodium (VOLTAREN) 1 % GEL Apply topically 4 (four) times daily.   escitalopram (LEXAPRO) 10 MG tablet TAKE 1 TABLET (10 MG TOTAL) BY MOUTH DAILY. (NEEDS TO BE SEEN BEFORE NEXT REFILL)   esomeprazole (NEXIUM) 40 MG capsule Take 1 capsule (40 mg total) by mouth 2 (two) times daily before a meal.   estradiol (ESTRACE) 1 MG tablet TAKE 1.5 TABLETS (1.5 MG TOTAL) BY MOUTH DAILY. (NEEDS TO BE SEEN BEFORE NEXT REFILL)   Semaglutide-Weight Management (WEGOVY) 1 MG/0.5ML SOAJ Inject 1 mg into the skin once a week.   No facility-administered encounter medications on file as of 02/17/2023.    Past Surgical History:  Procedure Laterality Date   ABDOMINAL HYSTERECTOMY  08/15/1993   partial   BACK SURGERY  08/15/2013  COLON SURGERY     COLONOSCOPY  06/01/2012   Procedure: COLONOSCOPY;  Surgeon: West Bali, MD;  Location: [facility];  Service: Endoscopy;  Laterality: N/A;  1:30PM   COLONOSCOPY N/A 06/14/2013   ZOX:WRUE diverticulosis/normal anastomosis   COLONOSCOPY WITH PROPOFOL N/A 02/18/2020   Procedure: COLONOSCOPY WITH PROPOFOL;  Surgeon: Corbin Ade, MD;  Location: [facility];  Service: Endoscopy;  Laterality: N/A;  10:00am   endoscopic left fallopian tube removed  several yrs ago   EUS  06/06/2012   Procedure: LOWER ENDOSCOPIC ULTRASOUND (EUS);  Surgeon: Willis Modena, MD;  Location: [facility];  Service: Endoscopy;  Laterality: N/A;   EXAMINATION UNDER ANESTHESIA  09/07/2012   Procedure: EXAM UNDER ANESTHESIA;  Surgeon: Almond Lint, MD;  Location: [facility];  Service: General;   Laterality: N/A;   EXCISION/RELEASE BURSA HIP  09/15/2011   Dr Shelle Iron EXCISION/RELEASE BURSA HIP;  Surgeon: Javier Docker, MD;  Location: [facility];  Service: Orthopedics;  Laterality: Left;  Excision of Trochanteric Bursitis   FLEXIBLE SIGMOIDOSCOPY N/A 03/22/2013   Procedure: FLEXIBLE SIGMOIDOSCOPY;  Surgeon: West Bali, MD;  Location: [facility];  Service: Endoscopy;  Laterality: N/A;  2:00   LAPAROSCOPIC LOW ANTERIOR RESECTION  07/02/2012   Procedure: LAPAROSCOPIC LOW ANTERIOR RESECTION;  Surgeon: Almond Lint, MD;  Location: [facility];  Service: General;  Laterality: N/A;   left ankle surgery for fx  several yrs ago   LUMBAR LAMINECTOMY/DECOMPRESSION MICRODISCECTOMY N/A 05/14/2014   Procedure: LUMBAR DECOMPRESSION L2-L3;  Surgeon: Javier Docker, MD;  Location: [facility];  Service: Orthopedics;  Laterality: N/A;   MASS EXCISION Left 02/02/2018   Procedure: EXCISION CYST, LEFT INNER THIGH;  Surgeon: Franky Macho, MD;  Location: [facility];  Service: General;  Laterality: Left;   PROCTOSCOPY  09/07/2012   Procedure: PROCTOSCOPY;  Surgeon: Almond Lint, MD;  Location: [facility];  Service: General;  Laterality: N/A;   rotator cuff surg Right     Family History  Problem Relation Age of Onset   Other Sister    Migraines Mother    Colon cancer Paternal Aunt       Controlled substance contract: n/a     Review of Systems  Constitutional:  Negative for diaphoresis.  Eyes:  Negative for pain.  Respiratory:  Negative for shortness of breath.   Cardiovascular:  Negative for chest pain, palpitations and leg swelling.  Gastrointestinal:  Negative for abdominal pain.  Endocrine: Negative for polydipsia.  Skin:  Negative for rash.  Neurological:  Negative for dizziness, weakness and headaches.  Hematological:  Does not bruise/bleed easily.  All other systems reviewed and are negative.      Objective:   Physical Exam Vitals and nursing note reviewed.  Constitutional:       General: She is not in acute distress.    Appearance: Normal appearance. She is well-developed.  HENT:     Head: Normocephalic.     Right Ear: Tympanic membrane normal.     Left Ear: Tympanic membrane normal.     Nose: Nose normal.     Mouth/Throat:     Mouth: Mucous membranes are moist.  Eyes:     Pupils: Pupils are equal, round, and reactive to light.  Neck:     Vascular: No carotid bruit or JVD.  Cardiovascular:     Rate and Rhythm: Normal rate and regular rhythm.     Heart sounds: Normal heart sounds.  Pulmonary:  Effort: Pulmonary effort is normal. No respiratory distress.     Breath sounds: Normal breath sounds. No wheezing or rales.  Chest:     Chest wall: No tenderness.  Abdominal:     General: Bowel sounds are normal. There is no distension or abdominal bruit.     Palpations: Abdomen is soft. There is no hepatomegaly, splenomegaly, mass or pulsatile mass.     Tenderness: There is no abdominal tenderness.  Musculoskeletal:        General: Normal range of motion.     Cervical back: Normal range of motion and neck supple.  Lymphadenopathy:     Cervical: No cervical adenopathy.  Skin:    General: Skin is warm and dry.  Neurological:     Mental Status: She is alert and oriented to person, place, and time.     Deep Tendon Reflexes: Reflexes are normal and symmetric.  Psychiatric:        Behavior: Behavior normal.        Thought Content: Thought content normal.        Judgment: Judgment normal.    BP 113/78   Pulse 84   Temp (!) 96.3 F (35.7 C) (Temporal)   Resp 20   Ht 5\' 6"  (1.676 m)   Wt 195 lb (88.5 kg)   SpO2 99%   BMI 31.47 kg/m         Assessment & Plan:  [name] comes in today with chief complaint of Medical Management of Chronic Issues   Diagnosis and orders addressed:  1. Gastroesophageal reflux disease, unspecified whether esophagitis present Avoid spicy foods Do not eat 2 hours prior to bedtime  - esomeprazole  (NEXIUM) 40 MG capsule; Take 1 capsule (40 mg total) by mouth 2 (two) times daily before a meal.  Dispense: 180 capsule; Refill: 1  2. Migraine without aura and without status migrainosus, not intractable Report new onset of migraines  3. Anxiety state Stress management - escitalopram (LEXAPRO) 10 MG tablet; Take 1 tablet (10 mg total) by mouth daily. (NEEDS TO BE SEEN BEFORE NEXT REFILL)  Dispense: 30 tablet; Refill: 0  4. Constipation, unspecified constipation type Continue with miralax  5. Sleep disorder Bedtime routine  6. BMI 31.0-31.9,adult Discussed diet and exercise for person with BMI >25 Will recheck weight in 3-6 months Continue wegovey as prescribed  7. Vaginal irritation Take diflucan as prescribed - Urinalysis, Complete - Urine Culture  8. Fever blister - valACYclovir (VALTREX) 1000 MG tablet; 2 po bid for 1 day at fever blister onset  Dispense: 20 tablet; Refill: 1   Labs pending Health Maintenance reviewed Diet and exercise encouraged  Follow up plan: 6 months   Mary-Margaret Daphine Deutscher, FNP

## 2023-02-17 NOTE — Patient Instructions (Signed)

## 2023-02-19 LAB — URINE CULTURE: Organism ID, Bacteria: NO GROWTH

## 2023-03-04 ENCOUNTER — Other Ambulatory Visit: Payer: Self-pay | Admitting: Nurse Practitioner

## 2023-03-04 DIAGNOSIS — F411 Generalized anxiety disorder: Secondary | ICD-10-CM

## 2023-03-10 MED ORDER — WEGOVY 1.7 MG/0.75ML ~~LOC~~ SOAJ: 1.7000 mg | SUBCUTANEOUS | 1 refills | Status: DC

## 2023-03-10 NOTE — Telephone Encounter (Signed)
I bumped you up to the next dose- may cause worsening rflux.  Meds ordered this encounter  Medications   Semaglutide-Weight Management (WEGOVY) 1.7 MG/0.75ML SOAJ    Sig: Inject 1.7 mg into the skin once a week.    Dispense:  3 mL    Refill:  1    Order Specific Question:   Supervising Provider    Answer:   Nils Pyle [[id number]]   Mary-Margaret Daphine Deutscher, FNP

## 2023-03-29 ENCOUNTER — Ambulatory Visit: Payer: 59 | Admitting: Orthopedic Surgery

## 2023-03-29 ENCOUNTER — Encounter: Payer: Self-pay | Admitting: Orthopedic Surgery

## 2023-03-29 VITALS — BP 115/79 | HR 84 | Ht 66.0 in | Wt 193.8 lb

## 2023-03-29 DIAGNOSIS — G5622 Lesion of ulnar nerve, left upper limb: Secondary | ICD-10-CM | POA: Diagnosis not present

## 2023-03-29 DIAGNOSIS — M7702 Medial epicondylitis, left elbow: Secondary | ICD-10-CM

## 2023-03-29 MED ORDER — PREDNISONE 10 MG (21) PO TBPK: ORAL_TABLET | ORAL | 0 refills | Status: DC

## 2023-03-29 MED ORDER — GABAPENTIN 100 MG PO CAPS: 100.0000 mg | ORAL_CAPSULE | Freq: Three times a day (TID) | ORAL | 0 refills | Status: DC

## 2023-03-29 NOTE — Patient Instructions (Addendum)
Consider a cubital tunnel splint to wear at night time

## 2023-03-29 NOTE — Progress Notes (Signed)
Orthopaedic Clinic Return  Assessment: [name] is a 57 y.o. 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  More recently, she has had burning type sensations at the elbow, with radiating pain into the small finger.  This most likely represents acute irritation of the ulnar nerve.  She is very uncomfortable in clinic today.  I have recommended a course of prednisone, as well as some gabapentin.  I would like to see her back in 3 weeks to ensure that she has improved.  We also discussed the nighttime cubital tunnel splint.  This can be obtained at the pharmacy as needed.  Follow-up: Return in about 3 weeks (around 04/19/2023).   Subjective:  Chief Complaint  Patient presents with   Elbow Pain    L elbow getting worse over past 2 wks.    History of Present Illness: [name] is a 57 y.o. female who returns to clinic for evaluation of left elbow pain.  I saw her in clinic a couple of months ago for medial elbow pain.  Injection of this area provided relief of her symptoms for 3-4 weeks.  Since then, the pain is gradually returned.  Over the past week, she notes a burning and shooting pains in the left elbow.  She has tingling into the small finger.    Review of Systems: No fevers or chills No numbness or tingling No chest pain No shortness of breath No bowel or bladder dysfunction No GI distress No headaches   Objective: BP 115/79   Pulse 84   Ht 5\' 6"  (1.676 m)   Wt 193 lb 12.8 oz (87.9 kg)   BMI 31.28 kg/m   Physical Exam:  Evaluation of the left elbow demonstrates no bruising.  No swelling.  She has tenderness to palpation at the medial epicondyle.   She has exquisite tenderness to light palpation.  Positive Tinel's.  Decreased sensation to the small finger.  IMAGING: I  personally ordered and reviewed the following images:  No new imaging obtained today.  Oliver Barre, MD 03/29/2023 9:51 AM

## 2023-04-02 ENCOUNTER — Other Ambulatory Visit: Payer: Self-pay | Admitting: Nurse Practitioner

## 2023-04-03 NOTE — Telephone Encounter (Signed)
Spoke with patient about appt, pt stated she just got a prescription in July for this medication from her gynecologist

## 2023-04-03 NOTE — Telephone Encounter (Signed)
MMM pt NTBS 30-d given 02/14/23

## 2023-04-09 ENCOUNTER — Other Ambulatory Visit: Payer: Self-pay | Admitting: Nurse Practitioner

## 2023-04-19 ENCOUNTER — Encounter: Payer: Self-pay | Admitting: Family Medicine

## 2023-04-19 ENCOUNTER — Ambulatory Visit: Payer: 59 | Admitting: Orthopedic Surgery

## 2023-04-19 ENCOUNTER — Ambulatory Visit: Payer: 59 | Admitting: Family Medicine

## 2023-04-19 VITALS — BP 117/77 | HR 86 | Temp 98.5°F | Ht 66.0 in | Wt 191.0 lb

## 2023-04-19 DIAGNOSIS — G43009 Migraine without aura, not intractable, without status migrainosus: Secondary | ICD-10-CM

## 2023-04-19 MED ORDER — KETOROLAC TROMETHAMINE 60 MG/2ML IM SOLN
60.0000 mg | Freq: Once | INTRAMUSCULAR | Status: AC
Start: 2023-04-19 — End: 2023-04-19
  Administered 2023-04-19: 60 mg via INTRAMUSCULAR

## 2023-04-19 NOTE — Progress Notes (Signed)
Subjective:  Patient ID: [name], female    DOB: [birth year], 57 y.o.   MRN: [id number]  Patient Care Team: Bennie Pierini, FNP as PCP - General (Family Medicine) Wyline Mood Alben Spittle, MD as PCP - Cardiology (Cardiology) West Bali, MD (Inactive) (Gastroenterology)   Chief Complaint:  Migraine (Nausea vomiting/'feels like lightning strikes all over head'/No visula disturbance/)   HPI: [name] is a 57 y.o. female presenting on 04/19/2023 for Migraine (Nausea vomiting/'feels like lightning strikes all over head'/No visula disturbance/) States that it started last night as a normal headache. Then worsened quickly.  States that she had migraines a long time ago. Her mother has migraines.  Takes percocet for back pain and that did not improve her symptoms. 1000 mg of tylenol x 2 did not improve symptoms.  Tried to go to work and was not able to stay due to pain.  Endorses sensitivity to light and sound and nausea.  States that she will have a "lightening bolt" go through her head, endorses squeezing sensation across her head. States that the pain is in the same location as when she had migraines in the past.  States that this feels worse than when she had migraines in the past.  Endorses dizziness with standing. Denies tinnitus, endorses mild ear pain.  States that severity has lasted all day. Has not improved.   Relevant past medical, surgical, family, and social history reviewed and updated as indicated.  Allergies and medications reviewed and updated. Data reviewed: Chart in Epic.   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   Closed fracture of left distal fibula 04/27/2018   Constipation    SOME BLOOD IN STOOL   GERD (gastroesophageal reflux disease)    occasionally-NO MEDS   History of kidney stones    Kidney stone    PONV  (postoperative nausea and vomiting)    used a scop patch last surgery-was better   Sleep disorder 07/10/2017    Past Surgical History:  Procedure Laterality Date   ABDOMINAL HYSTERECTOMY  08/15/1993   partial   BACK SURGERY  08/15/2013   COLON SURGERY     COLONOSCOPY  06/01/2012   Procedure: COLONOSCOPY;  Surgeon: West Bali, MD;  Location: [facility];  Service: Endoscopy;  Laterality: N/A;  1:30PM   COLONOSCOPY N/A 06/14/2013   NWG:NFAO diverticulosis/normal anastomosis   COLONOSCOPY WITH PROPOFOL N/A 02/18/2020   Procedure: COLONOSCOPY WITH PROPOFOL;  Surgeon: Corbin Ade, MD;  Location: [facility];  Service: Endoscopy;  Laterality: N/A;  10:00am   endoscopic left fallopian tube removed  several yrs ago   EUS  06/06/2012   Procedure: LOWER ENDOSCOPIC ULTRASOUND (EUS);  Surgeon: Willis Modena, MD;  Location: [facility];  Service: Endoscopy;  Laterality: N/A;   EXAMINATION UNDER ANESTHESIA  09/07/2012   Procedure: EXAM UNDER ANESTHESIA;  Surgeon: Almond Lint, MD;  Location: [facility];  Service: General;  Laterality: N/A;   EXCISION/RELEASE BURSA HIP  09/15/2011   Dr Shelle Iron EXCISION/RELEASE BURSA HIP;  Surgeon: Javier Docker, MD;  Location: [facility];  Service: Orthopedics;  Laterality: Left;  Excision of Trochanteric Bursitis   FLEXIBLE SIGMOIDOSCOPY N/A 03/22/2013   Procedure: FLEXIBLE SIGMOIDOSCOPY;  Surgeon: West Bali, MD;  Location: [facility];  Service: Endoscopy;  Laterality: N/A;  2:00  LAPAROSCOPIC LOW ANTERIOR RESECTION  07/02/2012   Procedure: LAPAROSCOPIC LOW ANTERIOR RESECTION;  Surgeon: Almond Lint, MD;  Location: [facility];  Service: General;  Laterality: N/A;   left ankle surgery for fx  several yrs ago   LUMBAR LAMINECTOMY/DECOMPRESSION MICRODISCECTOMY N/A 05/14/2014   Procedure: LUMBAR DECOMPRESSION L2-L3;  Surgeon: Javier Docker, MD;  Location: [facility];  Service: Orthopedics;  Laterality: N/A;   MASS EXCISION Left 02/02/2018    Procedure: EXCISION CYST, LEFT INNER THIGH;  Surgeon: Franky Macho, MD;  Location: [facility];  Service: General;  Laterality: Left;   PROCTOSCOPY  09/07/2012   Procedure: PROCTOSCOPY;  Surgeon: Almond Lint, MD;  Location: [facility];  Service: General;  Laterality: N/A;   rotator cuff surg Right     Social History   Socioeconomic History   Marital status: Single    Spouse name: Not on file   Number of children: 2   Years of education: Not on file   Highest education level: 12th grade  Occupational History   Occupation: primary Designer, television/film set    Employer: [employer]  Tobacco Use   Smoking status: Never   Smokeless tobacco: Never  Vaping Use   Vaping status: Never Used  Substance and Sexual Activity   Alcohol use: Yes    Comment: occasional couple times per mo   Drug use: No   Sexual activity: Yes    Birth control/protection: Surgical  Other Topics Concern   Not on file  Social History Narrative   Lives alone & 2 kids         Social Determinants of Health   Financial Resource Strain: Low Risk  (11/21/2022)   Overall Financial Resource Strain (CARDIA)    Difficulty of Paying Living Expenses: Not hard at all  Food Insecurity: No Food Insecurity (11/21/2022)   Hunger Vital Sign    Worried About Running Out of Food in the Last Year: Never true    Ran Out of Food in the Last Year: Never true  Transportation Needs: No Transportation Needs (11/21/2022)   PRAPARE - Administrator, Civil Service (Medical): No    Lack of Transportation (Non-Medical): No  Physical Activity: Insufficiently Active (11/21/2022)   Exercise Vital Sign    Days of Exercise per Week: 3 days    Minutes of Exercise per Session: 20 min  Stress: No Stress Concern Present (11/21/2022)   [employer] of Occupational Health - Occupational Stress Questionnaire    Feeling of Stress : Not at all  Social Connections: Unknown (11/21/2022)   Social Connection and Isolation Panel [NHANES]     Frequency of Communication with Friends and Family: More than three times a week    Frequency of Social Gatherings with Friends and Family: More than three times a week    Attends Religious Services: Patient declined    Database administrator or Organizations: No    Attends Engineer, structural: Not on file    Marital Status: Never married  Intimate Partner Violence: Not At Risk (02/14/2023)   Received from [facility]   Humiliation, Afraid, Rape, and Kick questionnaire    Fear of Current or Ex-Partner: No    Emotionally Abused: No    Physically Abused: No    Sexually Abused: No    Outpatient Encounter Medications as of 04/19/2023  Medication Sig   cyclobenzaprine (FLEXERIL) 10 MG tablet Take 10 mg by mouth 3 (three) times daily as needed.  diclofenac (VOLTAREN) 75 MG EC tablet Take 1 tablet by mouth 2 (two) times daily.   diclofenac Sodium (VOLTAREN) 1 % GEL Apply topically 4 (four) times daily.   escitalopram (LEXAPRO) 10 MG tablet Take 1 tablet (10 mg total) by mouth daily.   esomeprazole (NEXIUM) 40 MG capsule Take 1 capsule (40 mg total) by mouth 2 (two) times daily before a meal.   estradiol (ESTRACE) 1 MG tablet TAKE 1.5 TABLETS (1.5 MG TOTAL) BY MOUTH DAILY. (NEEDS TO BE SEEN BEFORE NEXT REFILL)   gabapentin (NEURONTIN) 100 MG capsule Take 1 capsule (100 mg total) by mouth 3 (three) times daily.   linaclotide (LINZESS) 145 MCG CAPS capsule Take by mouth.   oxyCODONE-acetaminophen (PERCOCET) 10-325 MG tablet    Semaglutide-Weight Management (WEGOVY) 1.7 MG/0.75ML SOAJ INJECT 1.7 MG INTO THE SKIN ONCE A WEEK.   valACYclovir (VALTREX) 1000 MG tablet 2 po bid for 1 day at fever blister onset   [DISCONTINUED] predniSONE (STERAPRED UNI-PAK 21 TAB) 10 MG (21) TBPK tablet 10 mg DS 12 as directed   No facility-administered encounter medications on file as of 04/19/2023.    Allergies  Allergen Reactions   Codeine Nausea Only    Confirmed intolerance of  codeine verbally with pt in PACU, pt states does not cause any SOB or dyspnea, some nausea only (MN-RN)   Ultram [Tramadol] Nausea And Vomiting    Ultram allergy also discussed, active N&V occurs with Ultram   Hydrocodone Hives   Latex Itching and Rash    Review of Systems As per HPI   Objective:  BP 117/77   Pulse 86   Temp 98.5 F (36.9 C)   Ht 5\' 6"  (1.676 m)   Wt 191 lb (86.6 kg)   SpO2 96%   BMI 30.83 kg/m    Wt Readings from Last 3 Encounters:  04/19/23 191 lb (86.6 kg)  03/29/23 193 lb 12.8 oz (87.9 kg)  02/17/23 195 lb (88.5 kg)    Physical Exam Constitutional:      General: She is awake. She is not in acute distress.    Appearance: Normal appearance. She is well-developed and well-groomed. She is not ill-appearing, toxic-appearing or diaphoretic.  Eyes:     General: Lids are normal.     Extraocular Movements:     Right eye: Normal extraocular motion and no nystagmus.     Left eye: Normal extraocular motion and no nystagmus.     Comments: Keeping eyes closed while in room, lights dimmed   Cardiovascular:     Rate and Rhythm: Normal rate.     Pulses: Normal pulses.          Radial pulses are 2+ on the right side and 2+ on the left side.       Posterior tibial pulses are 2+ on the right side and 2+ on the left side.     Heart sounds: Normal heart sounds. No murmur heard.    No gallop.  Pulmonary:     Effort: Pulmonary effort is normal. No respiratory distress.     Breath sounds: Normal breath sounds. No stridor. No wheezing, rhonchi or rales.  Musculoskeletal:     Cervical back: Full passive range of motion without pain and neck supple.     Right lower leg: No edema.     Left lower leg: No edema.  Skin:    General: Skin is warm.     Capillary Refill: Capillary refill takes less than 2 seconds.  Neurological:  General: No focal deficit present.     Mental Status: She is alert, oriented to person, place, and time and easily aroused. Mental status is at  baseline.     GCS: GCS eye subscore is 4. GCS verbal subscore is 5. GCS motor subscore is 6.     Cranial Nerves: Cranial nerves 2-12 are intact.     Sensory: Sensation is intact.     Motor: No weakness, tremor, atrophy or abnormal muscle tone.     Coordination: Coordination is intact.     Gait: Gait is intact. Gait normal.  Psychiatric:        Attention and Perception: Attention and perception normal.        Mood and Affect: Mood is depressed.        Speech: Speech normal.        Behavior: Behavior is withdrawn. Behavior is cooperative.        Thought Content: Thought content normal. Thought content does not include homicidal or suicidal ideation. Thought content does not include homicidal or suicidal plan.        Cognition and Memory: Cognition and memory normal.        Judgment: Judgment normal.     Comments: Patient withdrawn due to pain      Results for orders placed or performed in visit on 02/17/23  Urine Culture   Specimen: Urine   UR  Result Value Ref Range   Urine Culture, Routine Final report    Organism ID, Bacteria No growth   Microscopic Examination   Urine  Result Value Ref Range   WBC, UA 0-5 0 - 5 /hpf   RBC, Urine None seen 0 - 2 /hpf   Epithelial Cells (non renal) 0-10 0 - 10 /hpf   Renal Epithel, UA None seen None seen /hpf   Casts Present (A) None seen /lpf   Cast Type Hyaline casts N/A   Mucus, UA Present (A) Not Estab.   Bacteria, UA Few (A) None seen/Few  Urinalysis, Complete  Result Value Ref Range   Specific Gravity, UA >1.030 (H) 1.005 - 1.030   pH, UA 6.0 5.0 - 7.5   Color, UA Yellow Yellow   Appearance Ur Clear Clear   Leukocytes,UA Negative Negative   Protein,UA Negative Negative/Trace   Glucose, UA Negative Negative   Ketones, UA Negative Negative   RBC, UA Trace (A) Negative   Bilirubin, UA Negative Negative   Urobilinogen, Ur 0.2 0.2 - 1.0 mg/dL   Nitrite, UA Negative Negative   Microscopic Examination See below:        04/19/2023     2:48 PM 02/17/2023    3:17 PM 10/03/2022    3:43 PM 06/23/2022    8:23 AM 08/31/2021   11:26 AM  Depression screen PHQ 2/9  Decreased Interest 0 0 0 0 0  Down, Depressed, Hopeless 0 0 0 0 0  PHQ - 2 Score 0 0 0 0 0  Altered sleeping 0 0 0 0 0  Tired, decreased energy 0 0 0 0 0  Change in appetite 0 0 0 1 0  Feeling bad or failure about yourself  0 0 0 0 0  Trouble concentrating 0 0 0 0 0  Moving slowly or fidgety/restless 0 0 0 0 0  Suicidal thoughts 0 0 0 0 0  PHQ-9 Score 0 0 0 1 0  Difficult doing work/chores Not difficult at all Not difficult at all Not difficult at all Not difficult at all  Not difficult at all       04/19/2023    2:49 PM 02/17/2023    3:17 PM 10/03/2022    3:44 PM 06/23/2022    8:24 AM  GAD 7 : Generalized Anxiety Score  Nervous, Anxious, on Edge 0 0 1 0  Control/stop worrying 0 0 0 0  Worry too much - different things 0 0 0 0  Trouble relaxing 0 0 1 0  Restless 0 0 0 0  Easily annoyed or irritable 0 0 0 0  Afraid - awful might happen 0 0 0 0  Total GAD 7 Score 0 0 2 0  Anxiety Difficulty Not difficult at all Not difficult at all Not difficult at all Not difficult at all    Pertinent labs & imaging results that were available during my care of the patient were reviewed by me and considered in my medical decision making.  Assessment & Plan:  [name] was seen today for migraine.  Diagnoses and all orders for this visit:  Migraine without aura and without status migrainosus, not intractable -     ketorolac (TORADOL) injection 60 mg  Provided injection as above and sample of nurtec for patient to try. Discussed with patient obtaining an MRI to rule out SAH and to evaluate for acute migraine after several years without migraines. Patient declined imaging at this time. Discussed strict return instructions and follow up.   Continue all other maintenance medications.  Follow up plan: Return if symptoms worsen or fail to improve.  Continue healthy lifestyle  choices, including diet (rich in fruits, vegetables, and lean proteins, and low in salt and simple carbohydrates) and exercise (at least 30 minutes of moderate physical activity daily).  Written and verbal instructions provided   The above assessment and management plan was discussed with the patient. The patient verbalized understanding of and has agreed to the management plan. Patient is aware to call the clinic if they develop any new symptoms or if symptoms persist or worsen. Patient is aware when to return to the clinic for a follow-up visit. Patient educated on when it is appropriate to go to the emergency department.   Neale Burly, DNP-FNP [facility] [address] [place], [place] [address] [phone]

## 2023-04-19 NOTE — Patient Instructions (Signed)
Headache supplements: - Magnesium citrate 400mg daily - Riboflavin 400mg daily (B2) - coenzyme Q10 100mg three times daily  Maintain a headache diary.  Bring this to your next appointment Make sure you are getting plenty of water and rest.   

## 2023-04-20 ENCOUNTER — Telehealth: Payer: Self-pay | Admitting: Nurse Practitioner

## 2023-04-20 MED ORDER — SUMATRIPTAN SUCCINATE 50 MG PO TABS: 50.0000 mg | ORAL_TABLET | ORAL | 0 refills | Status: DC | PRN

## 2023-04-20 NOTE — Telephone Encounter (Signed)
Patient aware and verbalizes understanding. 

## 2023-04-20 NOTE — Telephone Encounter (Signed)
Please review and advise.

## 2023-04-20 NOTE — Telephone Encounter (Signed)
Pt seen yesterday and is asking if Jerrel Ivory with write a work note for pt to return tomorrow 04/21/2023. Pt also asking for samples of nurtec or if Jerrel Ivory can send in a rx to [facility]. Please call back

## 2023-05-24 ENCOUNTER — Ambulatory Visit: Payer: 59 | Admitting: Orthopedic Surgery

## 2023-05-24 ENCOUNTER — Encounter: Payer: Self-pay | Admitting: Orthopedic Surgery

## 2023-05-24 VITALS — BP 109/75 | HR 89 | Ht 66.0 in | Wt 189.0 lb

## 2023-05-24 DIAGNOSIS — G5622 Lesion of ulnar nerve, left upper limb: Secondary | ICD-10-CM | POA: Diagnosis not present

## 2023-05-24 DIAGNOSIS — M19012 Primary osteoarthritis, left shoulder: Secondary | ICD-10-CM | POA: Diagnosis not present

## 2023-05-24 NOTE — Progress Notes (Signed)
Return Patient Visit  Assessment: [name] is a 57 y.o. female with the following: 1.  Left shoulder glenohumeral joint arthritis 2.  Left cubital tunnel syndrome   Plan: [name] continues to have pain in the left shoulder.  We reviewed radiographs in clinic, which demonstrates advanced degenerative changes.  She is aware.  She is not interested in considering surgery.  She is interested in a repeat ultrasound injection.  In addition, she continues to have severe burning pains in the medial aspect of the left elbow.  Prior injection did provide some relief, and she would like to proceed with another injection.  We briefly discussed the possibility of obtaining EMGs to evaluate the irritation of the ulnar nerve, but she has no interest in surgery, and we will therefore avoid the nerve conduction studies at this time.  Procedure note injection - Left shoulder, ultrasound guidance   Verbal consent was obtained to inject the Left shoulder, glenohumeral joint  Timeout was completed to confirm the site of injection.   Using the ultrasound, the rotator cuff tendons were identified.  The joint space was also identified. The skin was prepped with alcohol and ethyl chloride was sprayed at the injection site.  A 21-gauge needle was used to inject 40 mg of Depo-Medrol and 1% lidocaine (4 cc) into the glenohumeral joint space of the Left shoulder using a posterolateral approach.  The needle was visualized entering the glenohumeral joint, and the medication was also visualized. There were no complications.  A sterile bandage was applied.   Note: In order to accurately identify the placement of the needle, ultrasound was required, to increase the accuracy, and specificity of the injection.  Procedure note injection Left medial epicondyle   Verbal consent was obtained to inject the left elbow, medial epicondyle Timeout was completed to confirm the site of injection.  The skin  was prepped with alcohol and ethyl chloride was sprayed at the injection site.  A 21-gauge needle was used to inject 40 mg of Depo-Medrol and 1% lidocaine (1 cc) into the medial epicondyle of the left elbow using a direct medial approach.  There were no complications. A sterile bandage was applied   Follow-up: Return if symptoms worsen or fail to improve.  Subjective:  Chief Complaint  Patient presents with   Elbow Pain    Would like injection in the left  elbow as well as the left shoulder   Shoulder Pain    Would like injection in left shoulder as well as left elbow    History of Present Illness: [name] is a 57 y.o. female who returns for evaluation of left shoulder pain.  She is also complaining of burning, sharp pains in the medial aspect of the left elbow.  Prior injections have helped some, and she would like to proceed with repeat injections today.  Limited function of the left shoulder, with pain in both the elbow and the shoulder.  Medications are providing some relief.  However, she notes some side effects with the gabapentin.  She does not want to consider surgery at this time.  Review of Systems: No fevers or chills No numbness or tingling No chest pain No shortness of breath No bowel or bladder dysfunction No GI distress No headaches    Objective: BP 109/75   Pulse 89   Ht 5\' 6"  (1.676 m)   Wt 189 lb (85.7 kg)   BMI 30.51 kg/m   Physical Exam:  General: Alert and  oriented. and No acute distress. Gait: Normal gait.  Left shoulder without deformity.  No swelling.  No bruising.  Forward flexion limited to 100 degrees.  Internal rotation to the side of her hip.  90 degrees of abduction.  Fingers are warm and well-perfused.  Sensation intact in the axillary nerve distribution.  Sensation intact throughout left hand.  Evaluation of the left elbow demonstrates no bruising. No swelling. She has tenderness to palpation at the medial epicondyle. No  bruising this area. Pain is recreated with resisted wrist flexion. Fingers are warm and well-perfused. She has intact sensation to the small finger. Well-healed medial elbow and volar palm surgical incisions.   IMAGING: No new imaging obtained today   New Medications:  No orders of the defined types were placed in this encounter.     Oliver Barre, MD  05/24/2023 4:06 PM

## 2023-05-24 NOTE — Patient Instructions (Signed)

## 2023-05-26 ENCOUNTER — Other Ambulatory Visit: Payer: Self-pay | Admitting: Orthopedic Surgery

## 2023-05-26 ENCOUNTER — Other Ambulatory Visit: Payer: Self-pay | Admitting: Nurse Practitioner

## 2023-06-02 ENCOUNTER — Telehealth: Payer: Self-pay

## 2023-06-02 ENCOUNTER — Other Ambulatory Visit (HOSPITAL_COMMUNITY): Payer: Self-pay

## 2023-06-02 NOTE — Telephone Encounter (Signed)
[name] (Key: [id number]) Rx #: [id number] 1.7MG /0.75ML auto-injectors Form [employer] Electronic [place] Form [id number] NCPDP) Created 4 days ago Sent to Plan 5 minutes ago Plan Response 5 minutes ago Submit Clinical Questions less than a minute ago Determination Wait for Determination Please wait for [employer] NCPDP 2017 to return a determination.

## 2023-06-02 NOTE — Telephone Encounter (Signed)
Pharmacy Patient Advocate Encounter  Received notification from [facility] that Prior Authorization for [facility] has been APPROVED from 10.18.24 to 10.18.25. Ran test claim, and The Rx can be filled again on 10.20.24. This test claim was processed through [facility]- copay amounts may vary at other pharmacies due to pharmacy/plan contracts, or as the patient moves through the different stages of their insurance plan.   PA #/Case ID/Reference #: (Key: [id number])

## 2023-06-03 ENCOUNTER — Other Ambulatory Visit: Payer: Self-pay | Admitting: Nurse Practitioner

## 2023-06-03 ENCOUNTER — Other Ambulatory Visit: Payer: Self-pay | Admitting: Gastroenterology

## 2023-06-03 DIAGNOSIS — F411 Generalized anxiety disorder: Secondary | ICD-10-CM

## 2023-06-12 ENCOUNTER — Ambulatory Visit: Payer: 59 | Admitting: Gastroenterology

## 2023-06-12 ENCOUNTER — Encounter: Payer: Self-pay | Admitting: Gastroenterology

## 2023-06-12 VITALS — BP 113/75 | HR 85 | Temp 98.5°F | Ht 66.0 in | Wt 186.8 lb

## 2023-06-12 DIAGNOSIS — K219 Gastro-esophageal reflux disease without esophagitis: Secondary | ICD-10-CM

## 2023-06-12 DIAGNOSIS — K5900 Constipation, unspecified: Secondary | ICD-10-CM | POA: Diagnosis not present

## 2023-06-12 NOTE — Patient Instructions (Signed)
Continue esomeprazole 40mg  twice daily before a meal for reflux. Continue Linzess . Try taking at bedtime each day. Continue miralax two scoops daily. Reach out in a couple of weeks and let me know how your bowels are doing.  If you notice recurrent blood in the stools, I would recommend colonoscopy. Otherwise you are due in 02/2025.

## 2023-06-12 NOTE — Progress Notes (Signed)
GI Office Note    Referring Provider: Bennie Pierini, * Primary Care Physician:  Bennie Pierini, FNP  Primary Gastroenterologist: formerly Dr. Darrick Penna  Chief Complaint   Chief Complaint  Patient presents with   Follow-up    Still having issues with constipation. States that she has to take miralax twice daily, dulcolax 4 to 5 days a week. Takes linzess every now and then but wont take it daily because it sends her to the bathroom.     History of Present Illness   [name] is a 57 y.o. female presenting today for follow up. Last seen 11/2022 for constipation. H/o rectal cancer in 2013 s/p LAR. Also with GERD.   Continues to struggle with constipation. Taking a variety of agents to try and move her bowels. Chronically an issue for her. Currently taking Miralax two scoops daily, bisacodyl (10mg ) 4-5 days per week. Amitiza didn't work for her. Linzess daily did not help. Currently on but takes only when she is able to stay around the house. It is not as predictable as before, takes longer to work. She never got the Motegrity we prescribed. Stools are bristol 5-6. Does not feel relief or if complete stools. No melena, brbpr. She did have some brbpr in the past, we had suggested updating colonoscopy but at this time she prefers to wait until she is due, 02/2025. Genella Rife doing ok on nexium 40mg  bid. Continues to lose weight on Wegovy.    Wt Readings from Last 15 Encounters:  06/12/23 186 lb 12.8 oz (84.7 kg)  05/24/23 189 lb (85.7 kg)  04/19/23 191 lb (86.6 kg)  03/29/23 193 lb 12.8 oz (87.9 kg)  02/17/23 195 lb (88.5 kg)  02/15/23 197 lb (89.4 kg)  01/18/23 200 lb (90.7 kg)  12/05/22 206 lb 6.4 oz (93.6 kg)  11/22/22 205 lb 12.8 oz (93.4 kg)  11/15/22 210 lb (95.3 kg)  10/03/22 219 lb (99.3 kg)  09/19/22 214 lb 9.6 oz (97.3 kg)  09/09/22 217 lb (98.4 kg)  06/23/22 217 lb (98.4 kg)  04/26/22 216 lb (98 kg)    Last colonoscopy  02/2020: -diverticulosis -surgical anastomosis at 2cm from anal verge -repeat colonoscopy in five year.  Medications   Current Outpatient Medications  Medication Sig Dispense Refill   cyclobenzaprine (FLEXERIL) 10 MG tablet Take 10 mg by mouth 3 (three) times daily as needed.     diclofenac (VOLTAREN) 75 MG EC tablet Take 1 tablet by mouth 2 (two) times daily.     diclofenac Sodium (VOLTAREN) 1 % GEL Apply topically 4 (four) times daily.     escitalopram (LEXAPRO) 10 MG tablet TAKE 1 TABLET BY MOUTH EVERY DAY 90 tablet 0   esomeprazole (NEXIUM) 40 MG capsule Take 1 capsule (40 mg total) by mouth 2 (two) times daily before a meal. 180 capsule 1   estradiol (ESTRACE) 1 MG tablet TAKE 1.5 TABLETS (1.5 MG TOTAL) BY MOUTH DAILY. (NEEDS TO BE SEEN BEFORE NEXT REFILL) 135 tablet 0   gabapentin (NEURONTIN) 100 MG capsule TAKE 1 CAPSULE (100 MG TOTAL) BY MOUTH THREE TIMES DAILY. 90 capsule 0   linaclotide (LINZESS) 290 MCG CAPS capsule Take 290 mcg by mouth daily before breakfast.     oxyCODONE-acetaminophen (PERCOCET) 10-325 MG tablet      Semaglutide-Weight Management (WEGOVY) 1.7 MG/0.75ML SOAJ INJECT 1.7 MG INTO THE SKIN ONCE A WEEK. 3 mL 0   SUMAtriptan (IMITREX) 50 MG tablet Take 1 tablet (50 mg total)  by mouth as needed for migraine. May repeat in 2 hours if headache persists or recurs. Max daily 2 doses (100mg ) 10 tablet 0   valACYclovir (VALTREX) 1000 MG tablet 2 po bid for 1 day at fever blister onset 20 tablet 1   No current facility-administered medications for this visit.    Allergies   Allergies as of 06/12/2023 - Review Complete 06/12/2023  Allergen Reaction Noted   Codeine Nausea Only 12/15/2010   Ultram [tramadol] Nausea And Vomiting 05/25/2012   Hydrocodone Hives 11/15/2022   Latex Itching and Rash 07/03/2012       Review of Systems   General: Negative for anorexia, weight loss, fever, chills, fatigue, weakness. ENT: Negative for hoarseness, difficulty swallowing , nasal  congestion. CV: Negative for chest pain, angina, palpitations, dyspnea on exertion, peripheral edema.  Respiratory: Negative for dyspnea at rest, dyspnea on exertion, cough, sputum, wheezing.  GI: See history of present illness. GU:  Negative for dysuria, hematuria, urinary incontinence, urinary frequency, nocturnal urination.  Endo: Negative for unusual weight change.     Physical Exam   BP 113/75 (BP Location: Right Arm, Patient Position: Sitting, Cuff Size: Large)   Pulse 85   Temp 98.5 F (36.9 C) (Oral)   Ht 5\' 6"  (1.676 m)   Wt 186 lb 12.8 oz (84.7 kg)   SpO2 97%   BMI 30.15 kg/m    General: Well-nourished, well-developed in no acute distress.  Eyes: No icterus. Mouth: Oropharyngeal mucosa moist and pink   Abdomen: Bowel sounds are normal, nontender, nondistended, no hepatosplenomegaly or masses,  no abdominal bruits or hernia , no rebound or guarding.  Rectal: not performed  Extremities: No lower extremity edema. No clubbing or deformities. Neuro: Alert and oriented x 4   Skin: Warm and dry, no jaundice.   Psych: Alert and cooperative, normal mood and affect.  Labs   None available  Imaging Studies   No results found.  Assessment/Plan:   GERD -doing well -does require Nexium BID since being on Wegovy -reinforced antireflux measures  Constipation -poorly controlled -continue miralax two scoops daily -trial of Linzess daily at bedtime. If does not tolerate, then would recommend trial of Trulance as next step -continue high fiber diet, plenty of liquids -reach out with progress report in couple of weeks     Leanna Battles. Melvyn Neth, MHS, PA-C [facility]

## 2023-06-23 ENCOUNTER — Other Ambulatory Visit: Payer: Self-pay | Admitting: Nurse Practitioner

## 2023-07-05 ENCOUNTER — Ambulatory Visit: Payer: 59 | Admitting: Orthopedic Surgery

## 2023-07-05 DIAGNOSIS — M19012 Primary osteoarthritis, left shoulder: Secondary | ICD-10-CM | POA: Diagnosis not present

## 2023-07-05 NOTE — Patient Instructions (Addendum)
Your surgery will be at [facility], scheduled with Dr Thane Edu   The hospital will contact you with a preoperative appointment to discuss Anesthesia. The phone number is [phone]   Please bring your medications with you for the appointment.  They will tell you the arrival time and medication instructions when you have your preoperative evaluation.  Do not wear nail polish the day of your surgery and if you take Phentermine you need to stop this medication ONE WEEK prior to your surgery.    If you take an blood thinning medication, we will need to stop this prior to surgery.  Typically, we stop this medicine at least 5 days prior to surgery.  We will need to confirm this with the doctor who prescribes this medication.  If you are taking medications or an injection for diabetes, or for weight management, this medicine will need to be stopped at least 7 days prior to surgery.     Surgery will be scheduled pending authorization by your insurance company, and medical clearance.  While we are working on your approval for CT  please go ahead and call to schedule your appointment with [facility] within at least one (1) week.   Central Scheduling [phone]

## 2023-07-06 ENCOUNTER — Encounter: Payer: Self-pay | Admitting: Orthopedic Surgery

## 2023-07-06 NOTE — Progress Notes (Signed)
Return Patient Visit  Assessment: [name] is a 57 y.o. female with the following: 1.  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  I have seen her multiple times.  We have tried multiple injections, as well as medications to help improve her pain.  Unfortunately, the most recent injection did not provide substantial relief in her pain.  She has been dealing with this for a long time.  We reviewed radiographs in clinic today, which demonstrates advanced degenerative changes in the left glenohumeral joint.  I do think she would do well with total shoulder arthroplasty.  This was discussed with the patient.  Given the chronicity, and severity of her pain, she is interested in proceeding with total shoulder arthroplasty.  The procedure was discussed in great detail.  All questions have been answered.  At this point, I have recommended we seek medical clearance for surgery.  In addition, we will obtain a CT scan of her left shoulder.  Unfortunately, her most recent injection was October 9, and we will therefore have to wait at least 3 months before proceeding with surgery.  The patient will be updated.  We would not be able to schedule surgery until early in 2025.  Risks and benefits of shoulder replacement, including, but not limited to infection, bleeding, persistent pain, need for further surgery, poor union of the implants, damage to surrounding structures, including nerves and blood vessels, dislocation, stiffness, blood clots and more severe complications associated with anesthesia were discussed with the patient.  It was provided ample opportunity to ask questions and all questions have been answered.  Through shared decision making, the patient has elected to proceed with surgery.      Follow-up: Return for After surgery.  Subjective:  No chief complaint on file.   History of Present Illness: [name] is a 57 y.o. female who returns for evaluation of left shoulder pain.  I have seen her several times for her left shoulder.  Most recently, I saw her approximately 6-7 weeks ago for shoulder and elbow pain.  I proceeded with a glenohumeral joint injection, under ultrasound guidance, as well as a medial epicondyle injection.  She states the injection helped with her elbow, but she continues to have severe pain in the left shoulder.  Injections are no longer helping.  She has chronic pain.  It affects her sleep.  It affects her ability to do her job at work.  Review of Systems: No fevers or chills No numbness or tingling No chest pain No shortness of breath No bowel or bladder dysfunction No GI distress No headaches    Objective: There were no vitals taken for this visit.  Physical Exam:  General: Alert and oriented. and No acute distress. Gait: Normal gait.  Left shoulder without deformity.  No swelling.  No bruising.  Forward flexion limited to 100 degrees.  Internal rotation to the side of her hip.  90 degrees of abduction.  Limited external rotation or side.  Fingers are warm and well-perfused.  Sensation intact in the axillary nerve distribution.  Sensation intact throughout left hand.    IMAGING: No new imaging obtained today   New Medications:  No orders of the defined types were placed in this encounter.     Oliver Barre, MD  07/06/2023 10:50 AM

## 2023-07-12 ENCOUNTER — Ambulatory Visit: Payer: 59 | Admitting: Orthopedic Surgery

## 2023-07-16 ENCOUNTER — Ambulatory Visit (HOSPITAL_COMMUNITY)
Admission: RE | Admit: 2023-07-16 | Discharge: 2023-07-16 | Disposition: A | Discharge status: Home / Self Care | Payer: 59 | Source: Ambulatory Visit | Attending: Orthopedic Surgery | Admitting: Orthopedic Surgery

## 2023-07-16 DIAGNOSIS — M19012 Primary osteoarthritis, left shoulder: Secondary | ICD-10-CM | POA: Diagnosis present

## 2023-07-24 ENCOUNTER — Other Ambulatory Visit: Payer: Self-pay | Admitting: Nurse Practitioner

## 2023-08-18 NOTE — Patient Instructions (Addendum)
 [name]  08/18/2023     @PREFPERIOPPHARMACY @   Your procedure is scheduled on  08/28/2023.   Report to [facility] at  0600  A.M.   Call this number if you have problems the morning of surgery:  [phone]  If you experience any cold or flu symptoms such as cough, fever, chills, shortness of breath, etc. between now and your scheduled surgery, please notify us  at the above number.   Remember:  Do not eat after midnight.   You may drink clear liquids until 0330 am on 08/28/2023.    Clear liquids allowed are:                    Water , Juice (No red color; non-citric and without pulp; diabetics please choose diet or no sugar options), Carbonated beverages (diabetics please choose diet or no sugar options), Clear Tea (No creamer, milk, or cream, including half & half and powdered creamer), Black Coffee Only (No creamer, milk or cream, including half & half and powdered creamer), and Clear Sports drink (No red color; diabetics please choose diet or no sugar options)    Take these medicines the morning of surgery with A SIP OF WATER         flexeril (if needed), diltiazem , escitalopram , oxycodone (if needed).     Do not wear jewelry, make-up or nail polish, including gel polish,  artificial nails, or any other type of covering on natural nails (fingers and  toes).  Do not wear lotions, powders, or perfumes, or deodorant.  Do not shave 48 hours prior to surgery.  Men may shave face and neck.  Do not bring valuables to the hospital.  [facility] is not responsible for any belongings or valuables.  Contacts, dentures or bridgework may not be worn into surgery.  Leave your suitcase in the car.  After surgery it may be brought to your room.  For patients admitted to the hospital, discharge time will be determined by your treatment team.  Patients discharged the day of surgery will not be allowed to drive home and must have someone with them for 24 hours.    Special  instructions:   DO NOT smoke tobacco or vape for 24 hours before your procedure.  Please read over the following fact sheets that you were given. Pain Booklet, Coughing and Deep Breathing, Surgical Site Infection Prevention, Anesthesia Post-op Instructions, and Care and Recovery After Surgery        Shoulder Replacement, Care After This sheet gives you information about how to care for yourself after your procedure. Your health care provider may also give you more specific instructions. If you have problems or questions, contact your health care provider. What can I expect after the procedure? After the procedure, it is common to have: A bruised and stiff shoulder and arm. Some shoulder and arm pain. Follow these instructions at home: Medicines Take over-the-counter and prescription medicines only as told by your health care provider. If you were prescribed an antibiotic medicine, use it as told by your health care provider. Do not stop using the antibiotic even if you start to feel better. Ask your health care provider if the medicine prescribed to you can cause constipation. You may need to take these actions to prevent or treat constipation: Drink enough fluid to keep your urine pale yellow. Take over-the-counter or prescription medicines. Eat foods that are high in fiber, such as beans, whole grains, and fresh fruits and vegetables.  Limit foods that are high in fat and processed sugars, such as fried or sweet foods. If you have a sling or immobilizer:  Wear the sling or immobilizer as told by your health care provider. Remove it only as told by your health care provider. Loosen the sling or immobilizer if your fingers tingle, become numb, or turn cold and blue. Keep the sling or immobilizer clean and dry. Bathing Do not take baths, swim, or use a hot tub until your health care provider approves. Ask your health care provider if you can take showers. You may only be allowed to take  sponge baths. If your sling or immobilizer is not waterproof: Do not let it get wet. Cover it with a watertight covering when you take a bath or a shower. Keep the bandage (dressing) dry until your health care provider says it can be removed. Incision care  Follow instructions from your health care provider about how to take care of your incision. Make sure you: Wash your hands with soap and water  for at least 20 seconds before and after you change your dressing. If soap and water  are not available, use hand sanitizer. Change your dressing as told by your health care provider. Leave staples, stitches (sutures), skin glue, or adhesive strips in place. These skin closures may need to stay in place for 2 weeks or longer. If adhesive strip edges start to loosen and curl up, you may trim the loose edges. Do not remove adhesive strips completely unless your health care provider tells you to do that. If you have a tube to remove drainage, follow instructions from your health care provider about caring for it. Do not remove the drain tube or any dressings around the tube opening unless your health care provider approves. Check your incision area every day for signs of infection. Check for: More redness, swelling, or pain. More fluid or blood. Warmth. Pus or a bad smell. Managing pain, stiffness, and swelling  If directed, put ice on the affected area. To do this: If you have a removable sling, remove it as told by your health care provider. Put ice in a plastic bag. Place a towel between your skin and the bag. Leave the ice on for 20 minutes, 2-3 times a day. Remove the ice if your skin turns bright red. This is very important. If you cannot feel pain, heat, or cold, you have a greater risk of damage to the area. If you have an icing device, use it as directed by your health care provider. Move your fingers and elbow often to reduce stiffness and swelling. Activity Do not use your arm to push  yourself up in bed or from a chair. Follow lifting restrictions as told: Do not lift anything that is heavier than a cup of coffee for the first 6 weeks after surgery, or as told by your health care provider. Do not lift anything that is heavier than 10 lb (4.5 kg), or the limit that you are told, for 6 months or until your health care provider says that it is safe. Do exercises, including physical therapy, as told by your health care provider. Try not to overuse your shoulder. This includes repetitive pushing or pulling. Early overuse of the shoulder may result in later problems. (Overusing the shoulder is easy to do when your pain goes away for the first time.) Avoid overstretching your arm for 6 weeks after surgery, or as told by your health care provider. Avoid sitting for a  long time without moving. Get up to take short walks every 1-2 hours. This is important to improve blood flow and breathing. Ask for help if you feel weak or unsteady. Ask for help with some activities. Your health care provider may be able to suggest a clinic or agency for this if you do not have home support. Do not participate in contact sports. Driving Ask your health care provider if the medicine prescribed to you requires you to avoid driving or using machinery. Do not drive for 2-4 weeks after surgery or as told by your health care provider. General instructions Tell your health care provider if you plan to have dental work. Also: Tell your dentist about your joint replacement. Ask your health care provider if there are any special instructions you need to follow before having dental care and routine cleanings. Do not use any products that contain nicotine or tobacco, such as cigarettes, e-cigarettes, and chewing tobacco. These can delay healing. If you need help quitting, ask your health care provider. Keep all follow-up visits. This is important. Contact a health care provider if: You develop a rash. You have a  fever. You have any of these signs of infection in your incision area: More redness, swelling, or pain. More fluid or blood. Warmth. Pus or a bad smell. Get help right away if: The edges of the incision site break open after sutures have been removed. You have redness, swelling, pain, or warmth in your leg or arm. You have chest pain or shortness of breath. These symptoms may represent a serious problem that is an emergency. Do not wait to see if the symptoms will go away. Get medical help right away. Call your local emergency services (911 in the [place] Do not drive yourself to the hospital. Summary It is common to have pain and stiffness in your shoulder and arm after the procedure. Put ice on the affected area and take pain medicine as told by your health care provider. Do not use your arm to push yourself up in bed or from a chair. Do exercises, including physical therapy, as told by your health care provider. Check your incision area daily. Call your health care provider if you see signs of infection. This information is not intended to replace advice given to you by your health care provider. Make sure you discuss any questions you have with your health care provider. Document Revised: 01/15/2020 Document Reviewed: 01/15/2020 Elsevier Patient Education  2024 [employer] General Anesthesia, Adult, Care After The following information offers guidance on how to care for yourself after your procedure. Your health care provider may also give you more specific instructions. If you have problems or questions, contact your health care provider. What can I expect after the procedure? After the procedure, it is common for people to: Have pain or discomfort at the IV site. Have nausea or vomiting. Have a sore throat or hoarseness. Have trouble concentrating. Feel cold or chills. Feel weak, sleepy, or tired (fatigue). Have soreness and body aches. These can affect parts of the body that  were not involved in surgery. Follow these instructions at home: For the time period you were told by your health care provider:  Rest. Do not participate in activities where you could fall or become injured. Do not drive or use machinery. Do not drink alcohol . Do not take sleeping pills or medicines that cause drowsiness. Do not make important decisions or sign legal documents. Do not take care of children on your  own. General instructions Drink enough fluid to keep your urine pale yellow. If you have sleep apnea, surgery and certain medicines can increase your risk for breathing problems. Follow instructions from your health care provider about wearing your sleep device: Anytime you are sleeping, including during daytime naps. While taking prescription pain medicines, sleeping medicines, or medicines that make you drowsy. Return to your normal activities as told by your health care provider. Ask your health care provider what activities are safe for you. Take over-the-counter and prescription medicines only as told by your health care provider. Do not use any products that contain nicotine or tobacco. These products include cigarettes, chewing tobacco, and vaping devices, such as e-cigarettes. These can delay incision healing after surgery. If you need help quitting, ask your health care provider. Contact a health care provider if: You have nausea or vomiting that does not get better with medicine. You vomit every time you eat or drink. You have pain that does not get better with medicine. You cannot urinate or have bloody urine. You develop a skin rash. You have a fever. Get help right away if: You have trouble breathing. You have chest pain. You vomit blood. These symptoms may be an emergency. Get help right away. Call 911. Do not wait to see if the symptoms will go away. Do not drive yourself to the hospital. Summary After the procedure, it is common to have a sore throat,  hoarseness, nausea, vomiting, or to feel weak, sleepy, or fatigue. For the time period you were told by your health care provider, do not drive or use machinery. Get help right away if you have difficulty breathing, have chest pain, or vomit blood. These symptoms may be an emergency. This information is not intended to replace advice given to you by your health care provider. Make sure you discuss any questions you have with your health care provider. Document Revised: 10/29/2021 Document Reviewed: 10/29/2021 Elsevier Patient Education  2024 [employer].

## 2023-08-20 ENCOUNTER — Other Ambulatory Visit: Payer: Self-pay | Admitting: Nurse Practitioner

## 2023-08-21 ENCOUNTER — Encounter (HOSPITAL_COMMUNITY)
Admission: RE | Admit: 2023-08-21 | Discharge: 2023-08-21 | Disposition: A | Discharge status: Home / Self Care | Payer: 59 | Source: Ambulatory Visit | Attending: Orthopedic Surgery | Admitting: Orthopedic Surgery

## 2023-08-21 ENCOUNTER — Encounter (HOSPITAL_COMMUNITY): Payer: Self-pay

## 2023-08-21 DIAGNOSIS — Z01818 Encounter for other preprocedural examination: Secondary | ICD-10-CM | POA: Insufficient documentation

## 2023-08-21 LAB — BASIC METABOLIC PANEL
Anion gap: 4 — ABNORMAL LOW (ref 5–15)
BUN: 11 mg/dL (ref 6–20)
CO2: 25 mmol/L (ref 22–32)
Calcium: 8.7 mg/dL — ABNORMAL LOW (ref 8.9–10.3)
Chloride: 106 mmol/L (ref 98–111)
Creatinine, Ser: 0.78 mg/dL (ref 0.44–1.00)
GFR, Estimated: >60 mL/min (ref >60)
Glucose, Bld: 90 mg/dL (ref 70–99)
Potassium: 4.2 mmol/L (ref 3.5–5.1)
Sodium: 135 mmol/L (ref 135–145)

## 2023-08-21 LAB — CBC
HCT: 39.3 % (ref 36.0–46.0)
Hemoglobin: 12.4 g/dL (ref 12.0–15.0)
MCH: 29.5 pg (ref 26.0–34.0)
MCHC: 31.6 g/dL (ref 30.0–36.0)
MCV: 93.3 fL (ref 80.0–100.0)
Platelets: 214 10*3/uL (ref 150–400)
RBC: 4.21 MIL/uL (ref 3.87–5.11)
RDW: 12.9 % (ref 11.5–15.5)
WBC: 6.6 10*3/uL (ref 4.0–10.5)
nRBC: 0 % (ref 0.0–0.2)

## 2023-08-21 LAB — SURGICAL PCR SCREEN
MRSA, PCR: NEGATIVE
Staphylococcus aureus: NEGATIVE

## 2023-08-22 ENCOUNTER — Encounter: Payer: Self-pay | Admitting: Orthopedic Surgery

## 2023-08-22 NOTE — Progress Notes (Signed)
 Alvan Ronal BRAVO, MD  Levora Randine SQUIBB, RN Yes, she is acceptable cardiac risk for surgery.       Previous Messages    ----- Message ----- From: Levora Randine SQUIBB, RN Sent: 08/21/2023   3:47 PM EST To: Ronal BRAVO Alvan, MD Subject: clearance                                      Good afternoon! Patient was seen in your office in February 2024 for SVT. The EKG is normal today.  She is having a Total Shoulder on 08/28/2023.  Is she okay from a cardiac stand point to have this procedure?

## 2023-08-28 ENCOUNTER — Ambulatory Visit (HOSPITAL_COMMUNITY): Payer: 59 | Admitting: Certified Registered"

## 2023-08-28 ENCOUNTER — Encounter (HOSPITAL_COMMUNITY)
Admission: RE | Disposition: A | Discharge status: Home / Self Care | Payer: Self-pay | Source: Home / Self Care | Attending: Orthopedic Surgery

## 2023-08-28 ENCOUNTER — Other Ambulatory Visit: Payer: Self-pay

## 2023-08-28 ENCOUNTER — Encounter (HOSPITAL_COMMUNITY): Payer: Self-pay | Admitting: Orthopedic Surgery

## 2023-08-28 ENCOUNTER — Ambulatory Visit (HOSPITAL_COMMUNITY): Payer: 59

## 2023-08-28 ENCOUNTER — Observation Stay (HOSPITAL_COMMUNITY)
Admission: RE | Admit: 2023-08-28 | Discharge: 2023-08-29 | Disposition: A | Discharge status: Home / Self Care | Payer: 59 | Attending: Orthopedic Surgery | Admitting: Orthopedic Surgery

## 2023-08-28 DIAGNOSIS — M19011 Primary osteoarthritis, right shoulder: Principal | ICD-10-CM | POA: Insufficient documentation

## 2023-08-28 DIAGNOSIS — Z85048 Personal history of other malignant neoplasm of rectum, rectosigmoid junction, and anus: Secondary | ICD-10-CM | POA: Insufficient documentation

## 2023-08-28 DIAGNOSIS — M19012 Primary osteoarthritis, left shoulder: Secondary | ICD-10-CM | POA: Diagnosis not present

## 2023-08-28 DIAGNOSIS — Z01818 Encounter for other preprocedural examination: Principal | ICD-10-CM

## 2023-08-28 HISTORY — PX: TOTAL SHOULDER ARTHROPLASTY: SHX126

## 2023-08-28 LAB — HEMOGLOBIN AND HEMATOCRIT, BLOOD
HCT: 39.3 % (ref 36.0–46.0)
Hemoglobin: 12.5 g/dL (ref 12.0–15.0)

## 2023-08-28 SURGERY — ARTHROPLASTY, SHOULDER, TOTAL
Anesthesia: General | Site: Shoulder | Laterality: Left

## 2023-08-28 MED ORDER — DEXAMETHASONE SODIUM PHOSPHATE 10 MG/ML IJ SOLN
INTRAMUSCULAR | Status: AC
Start: 2023-08-28 — End: ?
  Filled 2023-08-28: qty 1

## 2023-08-28 MED ORDER — PHENYLEPHRINE HCL-NACL 20-0.9 MG/250ML-% IV SOLN
INTRAVENOUS | Status: DC | PRN
  Administered 2023-08-28: 50 ug/min via INTRAVENOUS

## 2023-08-28 MED ORDER — ACETAMINOPHEN 500 MG PO TABS
1000.0000 mg | ORAL_TABLET | Freq: Three times a day (TID) | ORAL | Status: DC
  Administered 2023-08-28 – 2023-08-29 (×3): 1000 mg via ORAL
  Filled 2023-08-28 (×3): qty 2

## 2023-08-28 MED ORDER — DILTIAZEM HCL 30 MG PO TABS
30.0000 mg | ORAL_TABLET | Freq: Four times a day (QID) | ORAL | Status: DC | PRN
Start: 2023-08-28 — End: 2023-08-29

## 2023-08-28 MED ORDER — ONDANSETRON HCL 4 MG/2ML IJ SOLN
INTRAMUSCULAR | Status: DC | PRN
Start: 2023-08-28 — End: 2023-08-28
  Administered 2023-08-28: 4 mg via INTRAVENOUS

## 2023-08-28 MED ORDER — VANCOMYCIN HCL 1000 MG IV SOLR
INTRAVENOUS | Status: DC | PRN
  Administered 2023-08-28: 1000 mg

## 2023-08-28 MED ORDER — ORAL CARE MOUTH RINSE: 15.0000 mL | Freq: Once | OROMUCOSAL | Status: DC

## 2023-08-28 MED ORDER — CELECOXIB 100 MG PO CAPS
100.0000 mg | ORAL_CAPSULE | Freq: Every day | ORAL | Status: DC
  Administered 2023-08-28 – 2023-08-29 (×2): 100 mg via ORAL
  Filled 2023-08-28 (×2): qty 1

## 2023-08-28 MED ORDER — LIDOCAINE HCL (PF) 1 % IJ SOLN
INTRAMUSCULAR | Status: AC
  Filled 2023-08-28: qty 30

## 2023-08-28 MED ORDER — ROCURONIUM BROMIDE 10 MG/ML (PF) SYRINGE
PREFILLED_SYRINGE | INTRAVENOUS | Status: DC | PRN
  Administered 2023-08-28: 10 mg via INTRAVENOUS
  Administered 2023-08-28: 60 mg via INTRAVENOUS
  Administered 2023-08-28: 20 mg via INTRAVENOUS

## 2023-08-28 MED ORDER — SCOPOLAMINE 1 MG/3DAYS TD PT72
MEDICATED_PATCH | TRANSDERMAL | Status: AC
  Filled 2023-08-28: qty 1

## 2023-08-28 MED ORDER — TRANEXAMIC ACID-NACL 1000-0.7 MG/100ML-% IV SOLN
INTRAVENOUS | Status: AC
  Filled 2023-08-28: qty 100

## 2023-08-28 MED ORDER — PROPOFOL 10 MG/ML IV BOLUS
INTRAVENOUS | Status: AC
  Filled 2023-08-28: qty 20

## 2023-08-28 MED ORDER — GABAPENTIN 100 MG PO CAPS
100.0000 mg | ORAL_CAPSULE | Freq: Every day | ORAL | Status: DC
  Administered 2023-08-28: 100 mg via ORAL
  Filled 2023-08-28: qty 1

## 2023-08-28 MED ORDER — LIDOCAINE HCL (PF) 2 % IJ SOLN
INTRAMUSCULAR | Status: DC | PRN
  Administered 2023-08-28: 100 mg via INTRADERMAL

## 2023-08-28 MED ORDER — ONDANSETRON HCL 4 MG/2ML IJ SOLN: 4.0000 mg | Freq: Four times a day (QID) | INTRAMUSCULAR | Status: DC | PRN

## 2023-08-28 MED ORDER — PROPOFOL 500 MG/50ML IV EMUL
INTRAVENOUS | Status: DC | PRN
  Administered 2023-08-28: 25 ug/kg/min via INTRAVENOUS

## 2023-08-28 MED ORDER — ROPIVACAINE HCL 5 MG/ML IJ SOLN
INTRAMUSCULAR | Status: AC
  Filled 2023-08-28: qty 30

## 2023-08-28 MED ORDER — CHLORHEXIDINE GLUCONATE 0.12 % MT SOLN
15.0000 mL | Freq: Once | OROMUCOSAL | Status: DC
Start: 2023-08-28 — End: 2023-08-28

## 2023-08-28 MED ORDER — DEXAMETHASONE SODIUM PHOSPHATE 10 MG/ML IJ SOLN
INTRAMUSCULAR | Status: DC | PRN
  Administered 2023-08-28: 10 mg via INTRAVENOUS

## 2023-08-28 MED ORDER — TRANEXAMIC ACID-NACL 1000-0.7 MG/100ML-% IV SOLN
1000.0000 mg | INTRAVENOUS | Status: AC
  Administered 2023-08-28: 1000 mg via INTRAVENOUS

## 2023-08-28 MED ORDER — BUPIVACAINE-EPINEPHRINE (PF) 0.5% -1:200000 IJ SOLN
INTRAMUSCULAR | Status: AC
  Filled 2023-08-28: qty 30

## 2023-08-28 MED ORDER — CEFAZOLIN SODIUM-DEXTROSE 2-4 GM/100ML-% IV SOLN
INTRAVENOUS | Status: AC
  Filled 2023-08-28: qty 100

## 2023-08-28 MED ORDER — OXYCODONE HCL 5 MG PO TABS: 10.0000 mg | ORAL_TABLET | ORAL | Status: DC | PRN

## 2023-08-28 MED ORDER — ASPIRIN 81 MG PO CHEW
81.0000 mg | CHEWABLE_TABLET | Freq: Two times a day (BID) | ORAL | Status: DC
  Administered 2023-08-28 – 2023-08-29 (×3): 81 mg via ORAL
  Filled 2023-08-28 (×3): qty 1

## 2023-08-28 MED ORDER — CEFAZOLIN SODIUM-DEXTROSE 2-4 GM/100ML-% IV SOLN
2.0000 g | Freq: Three times a day (TID) | INTRAVENOUS | Status: DC
  Administered 2023-08-28 (×2): 2 g via INTRAVENOUS
  Filled 2023-08-28 (×2): qty 100

## 2023-08-28 MED ORDER — MIDAZOLAM HCL 2 MG/2ML IJ SOLN
2.0000 mg | Freq: Once | INTRAMUSCULAR | Status: AC
  Administered 2023-08-28: 2 mg via INTRAVENOUS

## 2023-08-28 MED ORDER — SCOPOLAMINE 1 MG/3DAYS TD PT72
1.0000 | MEDICATED_PATCH | TRANSDERMAL | Status: DC
  Administered 2023-08-28: 1.5 mg via TRANSDERMAL

## 2023-08-28 MED ORDER — SUGAMMADEX SODIUM 200 MG/2ML IV SOLN
INTRAVENOUS | Status: DC | PRN
  Administered 2023-08-28: 300 mg via INTRAVENOUS

## 2023-08-28 MED ORDER — PROPOFOL 10 MG/ML IV BOLUS
INTRAVENOUS | Status: DC | PRN
  Administered 2023-08-28: 200 mg via INTRAVENOUS

## 2023-08-28 MED ORDER — ROCURONIUM BROMIDE 10 MG/ML (PF) SYRINGE
PREFILLED_SYRINGE | INTRAVENOUS | Status: AC
  Filled 2023-08-28: qty 10

## 2023-08-28 MED ORDER — PROPOFOL 500 MG/50ML IV EMUL
INTRAVENOUS | Status: AC
  Filled 2023-08-28: qty 50

## 2023-08-28 MED ORDER — VANCOMYCIN HCL 1000 MG IV SOLR
INTRAVENOUS | Status: AC
Start: 2023-08-28 — End: ?
  Filled 2023-08-28: qty 20

## 2023-08-28 MED ORDER — PHENYLEPHRINE 80 MCG/ML (10ML) SYRINGE FOR IV PUSH (FOR BLOOD PRESSURE SUPPORT)
PREFILLED_SYRINGE | INTRAVENOUS | Status: DC | PRN
  Administered 2023-08-28: 80 ug via INTRAVENOUS
  Administered 2023-08-28 (×4): 160 ug via INTRAVENOUS

## 2023-08-28 MED ORDER — DEXMEDETOMIDINE HCL 200 MCG/2ML IV SOLN
INTRAVENOUS | Status: AC
  Filled 2023-08-28: qty 2

## 2023-08-28 MED ORDER — DEXMEDETOMIDINE HCL IN NACL 80 MCG/20ML IV SOLN
INTRAVENOUS | Status: AC
  Filled 2023-08-28: qty 20

## 2023-08-28 MED ORDER — OXYCODONE HCL 5 MG PO TABS
5.0000 mg | ORAL_TABLET | ORAL | Status: DC | PRN
  Administered 2023-08-29: 5 mg via ORAL
  Filled 2023-08-28: qty 1

## 2023-08-28 MED ORDER — MIDAZOLAM HCL 2 MG/2ML IJ SOLN
INTRAMUSCULAR | Status: AC
  Filled 2023-08-28: qty 2

## 2023-08-28 MED ORDER — EPHEDRINE SULFATE-NACL 50-0.9 MG/10ML-% IV SOSY
PREFILLED_SYRINGE | INTRAVENOUS | Status: DC | PRN
  Administered 2023-08-28 (×3): 5 mg via INTRAVENOUS
  Administered 2023-08-28: 10 mg via INTRAVENOUS

## 2023-08-28 MED ORDER — MORPHINE SULFATE (PF) 2 MG/ML IV SOLN: 1.0000 mg | INTRAVENOUS | Status: DC | PRN

## 2023-08-28 MED ORDER — ORAL CARE MOUTH RINSE: 15.0000 mL | OROMUCOSAL | Status: DC | PRN

## 2023-08-28 MED ORDER — PHENYLEPHRINE HCL-NACL 20-0.9 MG/250ML-% IV SOLN
INTRAVENOUS | Status: AC
  Filled 2023-08-28: qty 250

## 2023-08-28 MED ORDER — EPHEDRINE 5 MG/ML INJ
INTRAVENOUS | Status: AC
  Filled 2023-08-28: qty 5

## 2023-08-28 MED ORDER — DIPHENHYDRAMINE HCL 12.5 MG/5ML PO ELIX
12.5000 mg | ORAL_SOLUTION | ORAL | Status: DC | PRN
  Administered 2023-08-29: 12.5 mg via ORAL
  Filled 2023-08-28: qty 5

## 2023-08-28 MED ORDER — PHENYLEPHRINE 80 MCG/ML (10ML) SYRINGE FOR IV PUSH (FOR BLOOD PRESSURE SUPPORT)
PREFILLED_SYRINGE | INTRAVENOUS | Status: AC
  Filled 2023-08-28: qty 10

## 2023-08-28 MED ORDER — ONDANSETRON HCL 4 MG/2ML IJ SOLN
INTRAMUSCULAR | Status: AC
  Filled 2023-08-28: qty 2

## 2023-08-28 MED ORDER — FENTANYL CITRATE (PF) 100 MCG/2ML IJ SOLN
INTRAMUSCULAR | Status: AC
  Filled 2023-08-28: qty 2

## 2023-08-28 MED ORDER — DEXMEDETOMIDINE HCL IN NACL 80 MCG/20ML IV SOLN
INTRAVENOUS | Status: DC | PRN
  Administered 2023-08-28 (×2): 8 ug via INTRAVENOUS

## 2023-08-28 MED ORDER — ONDANSETRON HCL 4 MG PO TABS: 4.0000 mg | ORAL_TABLET | Freq: Four times a day (QID) | ORAL | Status: DC | PRN

## 2023-08-28 MED ORDER — SODIUM CHLORIDE 0.9 % IR SOLN
Status: DC | PRN
  Administered 2023-08-28: 1000 mL
  Administered 2023-08-28: 3000 mL

## 2023-08-28 MED ORDER — FENTANYL CITRATE (PF) 100 MCG/2ML IJ SOLN
INTRAMUSCULAR | Status: DC | PRN
  Administered 2023-08-28: 50 ug via INTRAVENOUS

## 2023-08-28 MED ORDER — LIDOCAINE HCL (PF) 2 % IJ SOLN
INTRAMUSCULAR | Status: AC
  Filled 2023-08-28: qty 5

## 2023-08-28 MED ORDER — CEFAZOLIN SODIUM-DEXTROSE 2-4 GM/100ML-% IV SOLN
2.0000 g | INTRAVENOUS | Status: AC
  Administered 2023-08-28: 2 g via INTRAVENOUS

## 2023-08-28 MED ORDER — LACTATED RINGERS IV SOLN: INTRAVENOUS | Status: DC

## 2023-08-28 MED ORDER — ESCITALOPRAM OXALATE 10 MG PO TABS
10.0000 mg | ORAL_TABLET | Freq: Every day | ORAL | Status: DC
  Administered 2023-08-29: 10 mg via ORAL
  Filled 2023-08-28: qty 1

## 2023-08-28 MED ORDER — STERILE WATER FOR IRRIGATION IR SOLN
Status: DC | PRN
  Administered 2023-08-28: 1000 mL

## 2023-08-28 MED ORDER — PANTOPRAZOLE SODIUM 40 MG PO TBEC
80.0000 mg | DELAYED_RELEASE_TABLET | Freq: Every day | ORAL | Status: DC
  Administered 2023-08-29: 80 mg via ORAL
  Filled 2023-08-28: qty 2

## 2023-08-28 SURGICAL SUPPLY — 57 items
ANCHOR SUT MAYO CAT SZ1 1/2 CI (Anchor) ×1 IMPLANT
BLADE SAW SGTL 83.5X18.5 (BLADE) ×1 IMPLANT
BLADE SURG SZ10 CARB STEEL (BLADE) ×1 IMPLANT
BNDG GAUZE ELAST 4 BULKY (GAUZE/BANDAGES/DRESSINGS) ×2 IMPLANT
BODY TRUNION ECLIPSE 39 SL (Shoulder) IMPLANT
BOWL SMART MIX CTS (DISPOSABLE) ×1 IMPLANT
CEMENT HV SMART SET (Cement) ×4 IMPLANT
CHLORAPREP W/TINT 26 (MISCELLANEOUS) ×2 IMPLANT
CLOTH BEACON ORANGE TIMEOUT ST (SAFETY) ×1 IMPLANT
COOLER ICEMAN CLASSIC (MISCELLANEOUS) ×1 IMPLANT
COUNTER NDL MAGNETIC 40 RED (SET/KITS/TRAYS/PACK) ×1 IMPLANT
COUNTER NEEDLE MAGNETIC 40 RED (SET/KITS/TRAYS/PACK) ×1
COVER LIGHT HANDLE STERIS (MISCELLANEOUS) ×2 IMPLANT
DRAPE SHOULDER BEACH CHAIR (DRAPES) ×1 IMPLANT
DRAPE U-SHAPE 47X51 STRL (DRAPES) ×1 IMPLANT
DRSG AQUACEL AG ADV 3.5X10 (GAUZE/BANDAGES/DRESSINGS) ×1 IMPLANT
ELECT BLADE 6 FLAT ULTRCLN (ELECTRODE) ×1 IMPLANT
ELECT REM PT RETURN 9FT ADLT (ELECTROSURGICAL) ×1
ELECTRODE REM PT RTRN 9FT ADLT (ELECTROSURGICAL) ×1 IMPLANT
GLENOID WITH CLEAT SM (Miscellaneous) IMPLANT
GLOVE BIOGEL PI IND STRL 7.0 (GLOVE) ×4 IMPLANT
GLOVE BIOGEL PI IND STRL 8 (GLOVE) ×1 IMPLANT
GLOVE SURG SS PI 7.0 STRL IVOR (GLOVE) IMPLANT
GLOVE SURG SS PI 8.0 STRL IVOR (GLOVE) IMPLANT
GOWN STRL REUS W/TWL LRG LVL3 (GOWN DISPOSABLE) ×1 IMPLANT
GOWN STRL REUS W/TWL XL LVL3 (GOWN DISPOSABLE) ×3 IMPLANT
HEAD HUMERAL ECLIPSE 39/18 (Shoulder) IMPLANT
HOOD PEEL AWAY T7 (MISCELLANEOUS) ×3 IMPLANT
IMPL ECLIPSE SPEEDCAP (Shoulder) IMPLANT
IMPLANT ECLIPSE SPEEDCAP (Shoulder) ×1 IMPLANT
IV NS IRRIG 3000ML ARTHROMATIC (IV SOLUTION) ×1 IMPLANT
KIT POSITION SHOULDER SCHLEI (MISCELLANEOUS) ×1 IMPLANT
KIT SET UNIVERSAL (KITS) IMPLANT
KIT TURNOVER KIT A (KITS) ×1 IMPLANT
MANIFOLD NEPTUNE II (INSTRUMENTS) ×1 IMPLANT
MARKER SKIN DUAL TIP RULER LAB (MISCELLANEOUS) ×1 IMPLANT
NS IRRIG 1000ML POUR BTL (IV SOLUTION) ×1 IMPLANT
PACK SRG BSC III STRL LF ECLPS (CUSTOM PROCEDURE TRAY) ×1 IMPLANT
PACK TOTAL JOINT (CUSTOM PROCEDURE TRAY) ×1 IMPLANT
PAD ARMBOARD 7.5X6 YLW CONV (MISCELLANEOUS) ×1 IMPLANT
PAD COLD SHLDR SM WRAP-ON (PAD) ×1 IMPLANT
PIN NITINOL TARGETER 2.8 (PIN) IMPLANT
SCREW MED ECLIPSE 35 (Screw) IMPLANT
SET BASIN LINEN APH (SET/KITS/TRAYS/PACK) ×1 IMPLANT
SET HNDPC FAN SPRY TIP SCT (DISPOSABLE) IMPLANT
SIZER ECLIPSE CAGE SCREW (ORTHOPEDIC DISPOSABLE SUPPLIES) IMPLANT
SLING ULTRA II L (ORTHOPEDIC SUPPLIES) IMPLANT
STRIP CLOSURE SKIN 1/2X4 (GAUZE/BANDAGES/DRESSINGS) ×2 IMPLANT
SUT MNCRL AB 4-0 PS2 18 (SUTURE) ×1 IMPLANT
SUT MON AB 2-0 CT1 36 (SUTURE) ×1 IMPLANT
SUT VIC AB 0 CT1 27XBRD ANTBC (SUTURE) ×1 IMPLANT
SUTURETAPE 1.3 40 W/NDL BLK/WH (SUTURE) ×3 IMPLANT
SYR BULB IRRIG 60ML STRL (SYRINGE) ×2 IMPLANT
TOWEL OR 17X26 4PK STRL BLUE (TOWEL DISPOSABLE) ×1 IMPLANT
TRAY FOLEY SLVR 16FR LF STAT (SET/KITS/TRAYS/PACK) IMPLANT
WATER STERILE IRR 1000ML POUR (IV SOLUTION) ×2 IMPLANT
YANKAUER SUCT 12FT TUBE ARGYLE (SUCTIONS) ×1 IMPLANT

## 2023-08-28 NOTE — Anesthesia Procedure Notes (Signed)
 Procedure Name: Intubation Date/Time: 08/28/2023 7:36 AM  Performed by: Eliodoro Deward FALCON, CRNAPre-anesthesia Checklist: Patient identified, Emergency Drugs available, Suction available and Patient being monitored Patient Re-evaluated:Patient Re-evaluated prior to induction Oxygen Delivery Method: Circle system utilized Preoxygenation: Pre-oxygenation with 100% oxygen Induction Type: IV induction Ventilation: Mask ventilation without difficulty Laryngoscope Size: Mac and 4 Grade View: Grade II Tube type: Oral Tube size: 7.5 mm Number of attempts: 1 Airway Equipment and Method: Stylet Placement Confirmation: positive ETCO2, ETT inserted through vocal cords under direct vision and breath sounds checked- equal and bilateral Secured at: 23 cm Tube secured with: Tape Dental Injury: Teeth and Oropharynx as per pre-operative assessment

## 2023-08-28 NOTE — Progress Notes (Signed)
   08/28/23 1241  TOC Brief Assessment  Insurance and Status Reviewed  Patient has primary care physician Yes  Home environment has been reviewed Single Family Home  Prior level of function: Independent  Prior/Current Home Services No current home services  Social Drivers of Health Review SDOH reviewed no interventions necessary  Readmission risk has been reviewed Yes  Transition of care needs no transition of care needs at this time     Transition of Care Department [facility]) has reviewed patient and no TOC needs have been identified at this time. We will continue to monitor patient advancement through interdisciplinary progression rounds. If new patient transition needs arise, please place a TOC consult.

## 2023-08-28 NOTE — Op Note (Signed)
 Orthopaedic Surgery Operative Note (CSN: [id number])  [name]  1966/06/11 Date of Surgery: 08/28/2023   Diagnoses:  Left glenohumeral arthritis  Procedure: Left stemless shoulder replacement   Operative Finding Successful completion of the planned procedure.     Post-Op Diagnosis: Same Surgeons:Primary: Onesimo Oneil LABOR, MD Assistants: Levon Free Location: [facility] OR ROOM 4 Anesthesia: General with regional anesthesia Antibiotics: Ancef  2 g with local vancomycin  powder 1 g at the surgical site Tourniquet time: N/A Estimated Blood Loss: 250 cc Complications: None Specimens: None  Implants: Implant Name Type Inv. Item Serial No. Manufacturer Lot No. LRB No. Used Action  CEMENT HV SMART SET - [id number] Cement CEMENT HV SMART SET  [employer] [id number] Left 1 Implanted  GLENOID WITH CLEAT SM - [id number] Miscellaneous GLENOID WITH CLEAT SM  [employer] [id number] Left 1 Implanted  BODY TRUNION ECLIPSE 39 SL - [id number] Shoulder BODY TRUNION ECLIPSE 39 SL  [employer] [id number] Left 1 Implanted  SCREW MED ECLIPSE 35 - [id number] Screw SCREW MED ECLIPSE 35  [employer] [id number] Left 1 Implanted  IMPLANT ECLIPSE SPEEDCAP - [id number] Shoulder IMPLANT ECLIPSE SPEEDCAP  [employer] [id number] Left 1 Implanted  HEAD HUMERAL ECLIPSE 39/18 - [id number] Shoulder HEAD HUMERAL ECLIPSE 39/18  [employer] [id number] Left 1 Implanted    Indications for Surgery:   [name] is a 58 y.o. female with progressively worsening Left shoulder pain and imaging demonstrating severe glenohumeral arthritis.  Attempts to improve the pain with medications, exercises, activity modifications and shoulder injections have not provided sufficient relief.  Patient is now complaining of worsening function and debilitating pain.  As a result, I have recommended shoulder replacement in order to restore form and function.  Benefits and risks of operative and nonoperative management were discussed prior  to surgery with the patient and informed consent form was completed.  Specific risks including infection, need for additional surgery, instability, dislocation, bleeding, damage to surrounding structures, persistent pain and more severe complications associated with anesthesia.  All questions were answered.    Procedure:   The patient was identified properly. Informed consent was obtained and the surgical site was marked. The patient was taken to the OR where general anesthesia was induced.  The patient was positioned in beach chair position with a pneumatic arm holder.  The left shoulder was prepped and draped in the usual sterile fashion.  Timeout was performed before the beginning of the case.  Patient received the above stated antibiotics and 1 g of TXA prior to making incision.  A standard deltopectoral approach was performed with a #10 blade. We dissected down to the subcutaneous tissues and the cephalic vein was taken laterally with the deltoid. The clavipectoral fascia was incised in line with the incision. Deep retractors were placed. The long of the biceps tendon was identified and there was significant tenosynovitis present.  Tenodesis was performed to the pectoralis tendon with #2 Fiberwire. The remaining biceps was followed up into the rotator interval where it was released. The subscapularis was taken down tendon peel off the footprint. The underlying capsul was elevated off of the humeral neck and the osteophytes inferiorly. #2 Fiberwire sutures are passed through the tendon for subscap manipulation. We continued releasing the capsule directly off of the osteophytes inferiorly all the way around the corner. This allowed us  to dislocate the humeral head. The humeral head had evidence of severe osteoarthritic wear with full-thickness cartilage loss and exposed subchondral bone. There was significant flattening of the humeral head.  The rotator cuff was carefully examined and noted to be intact  without sign of wear.  The decision was confirmed that an anatomic total shoulder was indicated for this patient.  There were osteophytes along the inferior humeral neck. The osteophytes were removed with an osteotome and a rongeur and the anatomic neck was well visualized.   We next made our humeral osteotomy with an oscillating saw along the anatomic neck. The head fragment was passed off the back table and measured approximately 39 mm in diameter.   Bone quality was reasonable and we decided that it was appropriate to use stemless implants and placed a guidepin perpendicular to our cut using a guide.  This obtained bicortical purchase.  We then reamed off of this to a flat surface and drilled and placed our nucleus.  A cut protector was placed.  The subscapularis was again identified and immediately we took care to palpate the axillary nerve anteriorly and verify its position with gentle palpation as well as the tug test.  We then released the SGHL with bovie cautery prior to placing a curved mayo at the junction of the anterior glenoid well above the axillary nerve and bluntly dissecting the subscapularis from the capsule.  We then carefully protected the axillary nerve as we gently released the inferior capsule to fully mobilize the subscapularis.  An anterior deltoid retractor was then placed as well as a small Hohmann retractor superiorly.    The glenoid was inspected and had evidence of severe osteoarthritic wear with full-thickness cartilage loss and exposed subchondral bone. The remaining labrum was removed circumferentially taking great care not to disrupt the posterior capsule.  Based on our preoperative templating, the guide was placed in the appropriate position. The center hole was drilled through the guide.  The glenoid was reamed concentrically over the guide pin. Next the center hole was enlarged and the drill guide for the peripheral pegs was placed. The vault was intact. The peripheral  pegs were drilled in standard fashion. A trial glenoid was placed and was stable. We removed the trial components.   The glenoid bone was prepared with pulsatile lavage and a sponge to dry the bone prior to cement application. Cement was mixed on the back table and the central and inferior holes were cemented. The glenoid was impacted securely.   We turned attention back to the humeral side. The cut protector was removed. We trialed with multiple size head options and selected a 39/18 which re-created the patient's anatomy. The offset was dialed in to match the normal anatomy. The shoulder was trialed.  There was good ROM in all planes and the shoulder was stable with approximately 50% posterior spring back and no inferior translation.  The real humeral implants were opened on the back table and assembled.  The trial was removed. The humeral implant tray was positioned and the cage screw was placed though the tray.  This achieved excellent purchase. Next we impacted the appropriately sized humeral head on to the tray.  The joint was reduced and thoroughly irrigated with pulsatile lavage. We placed multiple fibertak anchors around the implant, spanning the footprint of the subscapularis.  The sutures were passed through the subscapularis tendon and then secured within the biciptal groove with swivel lock anchors to complete a double row repair.  Next the rotator interval was closed with #2 fiberwire suture. Hemostasis was obtained. The deltopectoral interval was reapproximated with #1 vicryl. The subcutaneous tissues were closed with 2-0 Vicryl and the skin  was closed with a running monocryl. The incisions were cleaned and dried and an Aquacel dressing was placed. The drapes taken down. The arm was placed into sling with abduction pillow and a polar care machine.   Patient was awakened, extubated, and transferred to the recovery room in stable condition. There were no intraoperative complications. The sponge,  needle, and attention counts were correct at the end of the case.     Post-operative plan:  The patient will be admitted for over night observation To remain in the sling at all times, NWB PT to begin in the next 7-10 days.   DVT prophylaxis Aspirin  81 mg twice daily for 6 weeks, or the patient is fully ambulatory Pain control with PRN pain medication preferring oral medicines.   Follow up plan will be scheduled in approximately 10-14 days for incision check and XR

## 2023-08-28 NOTE — H&P (Signed)
 Below is the most recent clinic note for [name]; any pertinent information regarding their recent medical history will be updated on the day of surgery.    Return Patient Visit  Assessment: [name] is a 58 y.o. female with the following: 1.  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  I have seen her multiple times.  We have tried multiple injections, as well as medications to help improve her pain.  Unfortunately, the most recent injection did not provide substantial relief in her pain.  She has been dealing with this for a long time.  We reviewed radiographs in clinic today, which demonstrates advanced degenerative changes in the left glenohumeral joint.  I do think she would do well with total shoulder arthroplasty.  This was discussed with the patient.  Given the chronicity, and severity of her pain, she is interested in proceeding with total shoulder arthroplasty.  The procedure was discussed in great detail.  All questions have been answered.  At this point, I have recommended we seek medical clearance for surgery.  In addition, we will obtain a CT scan of her left shoulder.  Unfortunately, her most recent injection was October 9, and we will therefore have to wait at least 3 months before proceeding with surgery.  The patient will be updated.  We would not be able to schedule surgery until early in 2025.  Risks and benefits of shoulder replacement, including, but not limited to infection, bleeding, persistent pain, need for further surgery, poor union of the implants, damage to surrounding structures, including nerves and blood vessels, dislocation, stiffness, blood clots and more severe complications associated with anesthesia were discussed with the patient.  It was provided ample opportunity to ask questions and all questions have been answered.  Through shared decision making, the  patient has elected to proceed with surgery.      Follow-up: No follow-ups on file.  Subjective:  Left shoulder pain   History of Present Illness: [name] is a 59 y.o. female who returns for evaluation of left shoulder pain.  I have seen her several times for her left shoulder.  Most recently, I saw her approximately 6-7 weeks ago for shoulder and elbow pain.  I proceeded with a glenohumeral joint injection, under ultrasound guidance, as well as a medial epicondyle injection.  She states the injection helped with her elbow, but she continues to have severe pain in the left shoulder.  Injections are no longer helping.  She has chronic pain.  It affects her sleep.  It affects her ability to do her job at work.  Review of Systems: No fevers or chills No numbness or tingling No chest pain No shortness of breath No bowel or bladder dysfunction No GI distress No headaches    Objective: BP 128/79 (BP Location: Right Arm)   Pulse 70   Temp (!) 97.4 F (36.3 C)   Resp 13   SpO2 100%   Physical Exam:  General: Alert and oriented. and No acute distress. Gait: Normal gait.  Left shoulder without deformity.  No swelling.  No bruising.  Forward flexion limited to 100 degrees.  Internal rotation to the side of her hip.  90 degrees of abduction.  Limited external rotation or side.  Fingers are warm and well-perfused.  Sensation intact in the axillary nerve distribution.  Sensation intact throughout left hand.    IMAGING: No new imaging obtained today  [name], MD  08/28/2023 7:15 AM

## 2023-08-28 NOTE — Anesthesia Postprocedure Evaluation (Signed)
 Anesthesia Post Note  Patient: [name]  Procedure(s) Performed: LEFT TOTAL SHOULDER ARTHROPLASTY (Left: Shoulder)  Patient location during evaluation: PACU Anesthesia Type: General Level of consciousness: awake and alert Pain management: pain level controlled Vital Signs Assessment: post-procedure vital signs reviewed and stable Respiratory status: spontaneous breathing, nonlabored ventilation, respiratory function stable and patient connected to nasal cannula oxygen Cardiovascular status: blood pressure returned to baseline and stable Postop Assessment: no apparent nausea or vomiting Anesthetic complications: no   There were no known notable events for this encounter.   Last Vitals:  Vitals:   08/28/23 1053 08/28/23 1100  BP: (!) 80/51 (!) 75/59  Pulse: 78 76  Resp: (!) 22 14  Temp:    SpO2: 96% 97%    Last Pain:  Vitals:   08/28/23 1100  PainSc: 0-No pain                 Alexxander Kurt L Voncile Schwarz

## 2023-08-28 NOTE — Anesthesia Preprocedure Evaluation (Signed)
 Anesthesia Evaluation  Patient identified by MRN, date of birth, ID band Patient awake    Reviewed: Allergy & Precautions, H&P , NPO status , Patient's Chart, lab work & pertinent test results, reviewed documented beta blocker date and time   History of Anesthesia Complications (+) PONV and history of anesthetic complications  Airway Mallampati: II  TM Distance: >3 FB Neck ROM: full    Dental no notable dental hx. (+) Dental Advisory Given, Teeth Intact   Pulmonary neg pulmonary ROS   Pulmonary exam normal breath sounds clear to auscultation       Cardiovascular Exercise Tolerance: Good Normal cardiovascular exam+ dysrhythmias Supra Ventricular Tachycardia  Rhythm:regular Rate:Normal     Neuro/Psych  Headaches PSYCHIATRIC DISORDERS Anxiety      negative psych ROS   GI/Hepatic negative GI ROS, Neg liver ROS,GERD  ,,  Endo/Other  negative endocrine ROS    Renal/GU negative Renal ROS  negative genitourinary   Musculoskeletal negative musculoskeletal ROS (+)    Abdominal Normal abdominal exam  (+)   Peds negative pediatric ROS (+)  Hematology negative hematology ROS (+)   Anesthesia Other Findings cancer  Reproductive/Obstetrics negative OB ROS                             Anesthesia Physical Anesthesia Plan  ASA: 2  Anesthesia Plan: General   Post-op Pain Management: Regional block*   Induction:   PONV Risk Score and Plan: Ondansetron , Dexamethasone , Midazolam  and Scopolamine  patch - Pre-op  Airway Management Planned: Oral ETT  Additional Equipment: None  Intra-op Plan:   Post-operative Plan: Extubation in OR  Informed Consent: I have reviewed the patients History and Physical, chart, labs and discussed the procedure including the risks, benefits and alternatives for the proposed anesthesia with the patient or authorized representative who has indicated his/her understanding and  acceptance.     Dental Advisory Given  Plan Discussed with: CRNA  Anesthesia Plan Comments:        Anesthesia Quick Evaluation

## 2023-08-28 NOTE — Interval H&P Note (Signed)
 History and Physical Interval Note:  08/28/2023 7:16 AM  [name]  has presented today for surgery, with the diagnosis of LEFT GLENOHUMERAL ARTHRITIS.  The various methods of treatment have been discussed with the patient and family. After consideration of risks, benefits and other options for treatment, the patient has consented to  Procedure(s) with comments: LEFT TOTAL SHOULDER ARTHROPLASTY (Left) - NEEDS RNFA as a surgical intervention.  The patient's history has been reviewed, patient examined, no change in status, stable for surgery.  I have reviewed the patient's chart and labs.  Questions were answered to the patient's satisfaction.     Medical clearance has been obtained.  All questions have been answered.  She is ready to proceed with surgery.   [name]

## 2023-08-28 NOTE — Progress Notes (Signed)
 Instructed on incentive spirometer. 2200 ml obtained. Tolerated well.

## 2023-08-28 NOTE — Plan of Care (Signed)
  Problem: Activity: Goal: Risk for activity intolerance will decrease Outcome: Progressing   Problem: Coping: Goal: Level of anxiety will decrease Outcome: Progressing   Problem: Pain Management: Goal: General experience of comfort will improve Outcome: Progressing

## 2023-08-28 NOTE — Anesthesia Procedure Notes (Signed)
 Anesthesia Regional Block: Interscalene brachial plexus block   Pre-Anesthetic Checklist: , timeout performed,  Correct Patient, Correct Site, Correct Laterality,  Correct Procedure, Correct Position, site marked,  Risks and benefits discussed,  Surgical consent,  Pre-op evaluation,  At surgeon's request and post-op pain management  Laterality: Left  Prep: chloraprep       Needles:  Injection technique: Single-shot  Needle Type: Echogenic Needle     Needle Length: 4cm  Needle Gauge: 20   Needle insertion depth: 2 cm   Additional Needles:   Procedures:,,,, ultrasound used (permanent image in chart),,    Narrative:  Start time: 08/28/2023 7:15 AM End time: 08/28/2023 7:20 AM Injection made incrementally with aspirations every 5 mL.  Performed by: Personally  Anesthesiologist: Landry Dunnings, MD  Additional Notes: 20 cc 0.5% Ropivicaine with 50 mcg precedex  injected

## 2023-08-28 NOTE — Transfer of Care (Signed)
 Immediate Anesthesia Transfer of Care Note  Patient: [name]  Procedure(s) Performed: LEFT TOTAL SHOULDER ARTHROPLASTY (Left: Shoulder)  Patient Location: PACU  Anesthesia Type:General  Level of Consciousness: drowsy  Airway & Oxygen Therapy: Patient Spontanous Breathing and Patient connected to face mask oxygen  Post-op Assessment: Report given to RN and Post -op Vital signs reviewed and stable  Post vital signs: Reviewed and stable  Last Vitals:  Vitals Value Taken Time  BP 95/45   Temp    Pulse 76 08/28/23 1021  Resp 10 08/28/23 1021  SpO2 100 % 08/28/23 1021  Vitals shown include unfiled device data.  Last Pain:  Vitals:   08/28/23 0649  PainSc: 8          Complications: No notable events documented.

## 2023-08-29 ENCOUNTER — Other Ambulatory Visit: Payer: Self-pay

## 2023-08-29 DIAGNOSIS — M19011 Primary osteoarthritis, right shoulder: Secondary | ICD-10-CM | POA: Diagnosis not present

## 2023-08-29 DIAGNOSIS — Z96612 Presence of left artificial shoulder joint: Secondary | ICD-10-CM

## 2023-08-29 MED ORDER — ACETAMINOPHEN 500 MG PO TABS: 1000.0000 mg | ORAL_TABLET | Freq: Three times a day (TID) | ORAL | 0 refills | Status: AC

## 2023-08-29 MED ORDER — ONDANSETRON HCL 4 MG PO TABS: 4.0000 mg | ORAL_TABLET | Freq: Three times a day (TID) | ORAL | 0 refills | Status: AC | PRN

## 2023-08-29 MED ORDER — OXYCODONE HCL 5 MG PO TABS: 5.0000 mg | ORAL_TABLET | ORAL | 0 refills | Status: AC | PRN

## 2023-08-29 MED ORDER — CELECOXIB 100 MG PO CAPS: 100.0000 mg | ORAL_CAPSULE | Freq: Every day | ORAL | 0 refills | Status: AC

## 2023-08-29 MED ORDER — ASPIRIN 81 MG PO TBEC: 81.0000 mg | DELAYED_RELEASE_TABLET | Freq: Two times a day (BID) | ORAL | 0 refills | Status: AC

## 2023-08-29 NOTE — Evaluation (Signed)
 Physical Therapy Evaluation Patient Details Name: [name] MRN: [id number] DOB: 10-06-1965 Today's Date: 08/29/2023  History of Present Illness  Paw Karstens  has presented today for surgery, with the diagnosis of LEFT GLENOHUMERAL ARTHRITIS.  The various methods of treatment have been discussed with the patient and family. After consideration of risks, benefits and other options for treatment, the patient has consented to  Procedure(s) with comments:  LEFT TOTAL SHOULDER ARTHROPLASTY (Left) - NEEDS RNFA as a surgical intervention.  The patient's history has been reviewed, patient examined, no change in status, stable for surgery.  I have reviewed the patient's chart and labs.  Questions were answered to the patient's satisfaction.          Medical clearance has been obtained.  All questions have been answered.  She is ready to proceed with surgery.    Clinical Impression  Patient lying in bed on therapist arrival.  Agreeable to therapist assessment. Patient able to verbalize left shoulder precautions after instructed in precautions to include left shoulder NWBing and sling at all times. Rates her left shoulder pain at 6-7/10  Patient is modified independent with bed mobility; taking extra time due to left shoulder pain; able to transfer sit to stand and walk in the room with Supervision/CGA for safety; noted slight decreased gait speed.  Patient left in chair with call button in reach; sling and ice applied to left shoulder.  Patient will benefit from continued skilled therapy services during the remainder of her hospital stay and at the next recommended venue of care to address deficits and promote return to optimal function.          If plan is discharge home, recommend the following: A little help with bathing/dressing/bathroom;Help with stairs or ramp for entrance   Can travel by private vehicle        Equipment Recommendations None recommended by PT   Recommendations for Other Services       Functional Status Assessment Patient has had a recent decline in their functional status and demonstrates the ability to make significant improvements in function in a reasonable and predictable amount of time.     Precautions / Restrictions Precautions Precautions: Shoulder Type of Shoulder Precautions: left total shoulder Shoulder Interventions: Shoulder sling/immobilizer;At all times Restrictions Weight Bearing Restrictions Per Provider Order: Yes LUE Weight Bearing Per Provider Order: Non weight bearing      Mobility  Bed Mobility Overal bed mobility: Modified Independent             General bed mobility comments: takes extra time due to left shoulder pain Patient Response: Cooperative  Transfers Overall transfer level: Needs assistance   Transfers: Sit to/from Stand, Bed to chair/wheelchair/BSC Sit to Stand: Supervision, Contact guard assist   Step pivot transfers: Supervision, Contact guard assist       General transfer comment: takes extra time due to pain    Ambulation/Gait Ambulation/Gait assistance: Supervision, Contact guard assist Gait Distance (Feet): 20 Feet Assistive device: None Gait Pattern/deviations: WFL(Within Functional Limits) Gait velocity: decreased        Stairs            Wheelchair Mobility     Tilt Bed Tilt Bed Patient Response: Cooperative  Modified Rankin (Stroke Patients Only)       Balance  Pertinent Vitals/Pain Pain Assessment Pain Assessment: 0-10 Pain Score: 7  Pain Location: left shoulder Pain Intervention(s): Limited activity within patient's tolerance, Monitored during session, Repositioned, Ice applied    Home Living Family/patient expects to be discharged to:: Private residence Living Arrangements: Alone Available Help at Discharge: Family;Available 24 hours/day Type of Home: House Home  Access: Stairs to enter Entrance Stairs-Rails: Right;Left;Can reach both Entrance Stairs-Number of Steps: 7   Home Layout: One level Home Equipment: Shower seat - built in Additional Comments: has walker that she can borrow from mother    Prior Function                       Extremity/Trunk Assessment   Upper Extremity Assessment Upper Extremity Assessment: Left hand dominant;LUE deficits/detail LUE Deficits / Details: s/p TSA left    Lower Extremity Assessment Lower Extremity Assessment: Overall WFL for tasks assessed    Cervical / Trunk Assessment Cervical / Trunk Assessment: Kyphotic  Communication   Communication Communication: No apparent difficulties  Cognition Arousal: Alert   Overall Cognitive Status: Within Functional Limits for tasks assessed                                          General Comments      Exercises     Assessment/Plan    PT Assessment Patient needs continued PT services  PT Problem List Decreased strength;Decreased range of motion;Decreased activity tolerance;Decreased balance;Decreased mobility;Pain       PT Treatment Interventions Therapeutic activities;Therapeutic exercise;Patient/family education;Gait training    PT Goals (Current goals can be found in the Care Plan section)  Acute Rehab PT Goals Patient Stated Goal: return home with daughter to assist PT Goal Formulation: With patient Time For Goal Achievement: 09/12/23 Potential to Achieve Goals: Good    Frequency Min 2X/week     Co-evaluation               AM-PAC PT 6 Clicks Mobility  Outcome Measure Help needed turning from your back to your side while in a flat bed without using bedrails?: None Help needed moving from lying on your back to sitting on the side of a flat bed without using bedrails?: None Help needed moving to and from a bed to a chair (including a wheelchair)?: A Little Help needed standing up from a chair using your  arms (e.g., wheelchair or bedside chair)?: A Little Help needed to walk in hospital room?: A Little Help needed climbing 3-5 steps with a railing? : A Little 6 Click Score: 20    End of Session Equipment Utilized During Treatment: Other (comment) (left shoulder sling) Activity Tolerance: Patient tolerated treatment well;Patient limited by pain Patient left: in chair;with call bell/phone within reach Nurse Communication: Mobility status PT Visit Diagnosis: Pain;Other (comment);Other abnormalities of gait and mobility (R26.89) (left shoulder weakness, pain) Pain - Right/Left: Left Pain - part of body: Shoulder    Time: 0730-0750 PT Time Calculation (min) (ACUTE ONLY): 20 min   Charges:   PT Evaluation $PT Eval Low Complexity: 1 Low   PT General Charges $$ ACUTE PT VISIT: 1 Visit         8:24 AM, 08/29/23 Lenah Messenger Small Armstead Heiland MPT [facility] [place] [phone] Ph:[phone]

## 2023-08-29 NOTE — TOC Progression Note (Signed)
 Transition of Care [facility]) - Progression Note    Patient Details  Name: [name] MRN: [id number] Date of Birth: Jan 17, 1966  [facility]) CM/SW Contact  Noreen KATHEE [place], [place] Phone Number: 08/29/2023, 9:16 AM  Clinical Narrative:     CSW reviewed chart and orders and seen that OP PT was order by MD. CSW did speak with pt this morning and she confirmed as well her designated OP PT in [place]. Pt daughter will pick her up. TOC signing off , no TOC needs indicated.     Barriers to Discharge: Barriers Resolved  Expected Discharge Plan and Services  Home with self-care and follow with OP PT     Expected Discharge Date: 08/29/23                  Social Determinants of Health (SDOH) Interventions SDOH Screenings   Food Insecurity: No Food Insecurity (08/28/2023)  Housing: Low Risk  (08/28/2023)  Transportation Needs: No Transportation Needs (08/28/2023)  Utilities: Not At Risk (08/28/2023)  Alcohol  Screen: Low Risk  (11/21/2022)  Depression (PHQ2-9): Low Risk  (04/19/2023)  Financial Resource Strain: Low Risk  (11/21/2022)  Physical Activity: Insufficiently Active (11/21/2022)  Social Connections: Unknown (11/21/2022)  Stress: No Stress Concern Present (11/21/2022)  Tobacco Use: Low Risk  (08/21/2023)  Health Literacy: Low Risk  (02/14/2023)   Received from [facility]    Readmission Risk Interventions     No data to display

## 2023-08-29 NOTE — Discharge Summary (Signed)
 Patient ID: [name] MRN: [id number] DOB/AGE: Jan 07, 1966 58 y.o.  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  Tolerated procedure well.  Was kept for monitoring overnight for pain control and medical monitoring postop and was found to be stable for DC home the morning after surgery.  Patient was evaluated by OT/OT prior to discharge.  Patient was instructed on specific activity restrictions and all questions were answered.  Patient was discharged on POD#1 in stable condition.  They will contact the clinic if they have any concerns upon discharge.    Consults: None  Significant Diagnostic Studies: No additional pertinent studies  Treatments: Surgery  Discharge Exam:     08/29/2023    5:07 AM 08/29/2023   12:56 AM 08/28/2023   11:16 PM  Vitals with BMI  Systolic 110 108 894  Diastolic 64 52 62  Pulse 73 74 76     Alert and oriented, no acute distress  Sling and ice machine fitting appropriately. Dressing is clean dry and intact, small amount of strikethrough noted Active motion intact throughout the hand Sensation intact in the axillary nerve  distribution. Fingers are warm and well perfused   CBC    Latest Ref Rng & Units 08/28/2023   10:40 AM 08/21/2023    3:18 PM 09/09/2022    4:28 PM  CBC  WBC 4.0 - 10.5 K/uL  6.6  7.6   Hemoglobin 12.0 - 15.0 g/dL 87.4  87.5  86.1   Hematocrit 36.0 - 46.0 % 39.3  39.3  41.9   Platelets 150 - 400 K/uL  214  240         Disposition: Discharge disposition: 01-Home or Self Care        Allergies as of 08/29/2023       Reactions   Codeine Nausea Only   Confirmed intolerance of codeine verbally with pt in PACU, pt states does not cause any SOB or dyspnea, some nausea only (MN-RN)   Ultram [tramadol] Nausea And Vomiting   Ultram allergy also discussed, active N&V occurs with Ultram   Hydrocodone  Hives   Latex Itching, Rash        Medication List     STOP taking these medications    oxyCODONE -acetaminophen  10-325 MG tablet Commonly known as: PERCOCET   SUMAtriptan  50 MG tablet Commonly known as: Imitrex    valACYclovir  1000 MG tablet Commonly known as: VALTREX        TAKE these medications    acetaminophen  500 MG tablet Commonly known as: TYLENOL  Take 2 tablets (1,000 mg total) by mouth every 8 (eight) hours for 14 days.   aspirin  EC 81 MG tablet Take 1 tablet (  81 mg total) by mouth in the morning and at bedtime. Swallow whole.   celecoxib  100 MG capsule Commonly known as: CeleBREX  Take 1 capsule (100 mg total) by mouth daily for 14 days.   cyclobenzaprine  10 MG tablet Commonly known as: FLEXERIL  Take 10 mg by mouth 3 (three) times daily as needed for muscle spasms.   diltiazem  30 MG tablet Commonly known as: CARDIZEM  Take 30 mg by mouth 4 (four) times daily as needed (tachycardia).   escitalopram  10 MG tablet Commonly known as: LEXAPRO  TAKE 1 TABLET BY MOUTH EVERY DAY   esomeprazole  40 MG capsule Commonly known as: NEXIUM  Take 1 capsule (40 mg total) by mouth 2 (two) times daily before a meal.   estradiol  1 MG tablet Commonly known as:  ESTRACE  TAKE 1.5 TABLETS BY MOUTH DAILY.   gabapentin  100 MG capsule Commonly known as: NEURONTIN  TAKE 1 CAPSULE (100 MG TOTAL) BY MOUTH THREE TIMES DAILY. What changed: See the new instructions.   Linzess  290 MCG Caps capsule Generic drug: linaclotide  Take 290 mcg by mouth daily as needed (constipation).   ondansetron  4 MG tablet Commonly known as: Zofran  Take 1 tablet (4 mg total) by mouth every 8 (eight) hours as needed for up to 14 days for nausea or vomiting.   oxyCODONE  5 MG immediate release tablet Commonly known as: Roxicodone  Take 1 tablet (5 mg total) by mouth every 4 (four) hours as needed for up to 7 days.   Wegovy  1.7 MG/0.75ML Soaj Generic drug: Semaglutide -Weight Management INJECT 1.7 MG INTO THE SKIN ONCE A WEEK.        Follow-up Information     Onesimo Oneil LABOR, MD. Call.   Specialties: Orthopedic Surgery, Sports Medicine Why: 10-14 days for suture removal/incision check Contact information: [address] [place] [address] [phone]

## 2023-08-29 NOTE — Discharge Instructions (Signed)
Luther Newhouse A. Jurney Overacker, MD MS [facility] [address] [place],  [place]  [address] Phone: [phone] Fax: [phone]    POST-OPERATIVE INSTRUCTIONS - TOTAL SHOULDER REPLACEMENT    WOUND CARE You may leave the operative dressing in place until your follow-up appointment. KEEP THE INCISIONS CLEAN AND DRY. There may be a small amount of fluid/bleeding leaking at the surgical site. This is normal after surgery.  If it fills with liquid or blood please call us immediately to change it for you. Use the provided ice machine or Ice packs as often as possible for the first 3-4 days, then as needed for pain relief.  Keep a layer of cloth or a shirt between your skin and the cooling unit to prevent frost bite as it can get very cold.  SHOWERING: - You may shower on Post-Op Day #3.  - The dressing is water resistant but do not scrub it as it may start to peel up.   - You may remove the sling for showering, but keep a water resistant pillow under the arm to keep both the  elbow and shoulder away from the body (mimicking the abduction sling).  - Gently pat the area dry.  - Do not soak the shoulder in water. Do not go swimming in the pool or ocean until your sutures are removed. - KEEP THE INCISIONS CLEAN AND DRY.  EXERCISES Wear the sling at all times except when doing your exercises. You may remove the sling for showering, but keep the arm across the chest or in a secondary sling.    Accidental/Purposeful External Rotation and shoulder flexion (reaching behind you) is to be avoided at all costs for the first month. It is ok to come out of your sling if your are sitting and have assistance for eating.  Do not lift anything heavier than 1 pound until we discuss it further in clinic. Please perform the exercises:   Elbow / Hand / Wrist  Range of Motion Exercises Grip strengthening   REGIONAL ANESTHESIA (NERVE BLOCKS) The anesthesia team may have performed a nerve block  for you if safe in the setting of your care.  This is a great tool used to minimize pain.  Typically the block may start wearing off overnight but the long acting medicine may last for 3-4 days.  The nerve block wearing off can be a challenging period but please utilize your as needed pain medications to try and manage this period.    POST-OP MEDICATIONS- Multimodal approach to pain control In general your pain will be controlled with a combination of substances.  Prescriptions unless otherwise discussed are electronically sent to your pharmacy.  This is a carefully made plan we use to minimize narcotic use.     Celebrex - Anti-inflammatory medication taken on a scheduled basis Acetaminophen - Non-narcotic pain medicine taken on a scheduled basis  Oxycodone - This is a strong narcotic, to be used only on an "as needed" basis for pain. Aspirin 81mg - This medicine is used to minimize the risk of blood clots after surgery. Zofran -  take as needed for nausea  Meloxicam/Celebrex - these are anti-inflammatory and pain relievers.  Do not take additional ibuprofen, naproxen or other NSAID while taking this medicine.   FOLLOW-UP If you develop a Fever (>101.5), Redness or Drainage from the surgical incision site, please call our office to arrange for an evaluation. Please call the office to schedule a follow-up appointment for a   wound check, 7-10 days post-operatively.  

## 2023-08-29 NOTE — Plan of Care (Signed)
  Problem: Acute Rehab PT Goals(only PT should resolve) Goal: Pt Will Go Supine/Side To Sit Outcome: Progressing Flowsheets (Taken 08/29/2023 0825) Pt will go Supine/Side to Sit: Independently

## 2023-08-30 ENCOUNTER — Encounter (HOSPITAL_COMMUNITY): Payer: Self-pay | Admitting: Orthopedic Surgery

## 2023-09-05 ENCOUNTER — Ambulatory Visit: Payer: 59 | Attending: Orthopedic Surgery

## 2023-09-05 DIAGNOSIS — M25512 Pain in left shoulder: Secondary | ICD-10-CM | POA: Insufficient documentation

## 2023-09-05 DIAGNOSIS — M25612 Stiffness of left shoulder, not elsewhere classified: Secondary | ICD-10-CM | POA: Insufficient documentation

## 2023-09-05 DIAGNOSIS — Z96612 Presence of left artificial shoulder joint: Secondary | ICD-10-CM | POA: Diagnosis not present

## 2023-09-05 NOTE — Therapy (Signed)
OUTPATIENT PHYSICAL THERAPY SHOULDER EVALUATION   Patient Name: [name] MRN: [id number] DOB:15-Oct-1965, 58 y.o., female Today's Date: 09/05/2023  END OF SESSION:  PT End of Session - 09/05/23 0919     Visit Number 1    Number of Visits 16    Date for PT Re-Evaluation 11/10/23    PT Start Time 0920    PT Stop Time 1009    PT Time Calculation (min) 49 min    Activity Tolerance Patient tolerated treatment well    Behavior During Therapy WFL for tasks assessed/performed             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   Closed fracture of left distal fibula 04/27/2018   Constipation    SOME BLOOD IN STOOL   GERD (gastroesophageal reflux disease)    occasionally-NO MEDS   History of kidney stones    PONV (postoperative nausea and vomiting)    used a scop patch last surgery-was better   Sleep disorder 07/10/2017   SVT (supraventricular tachycardia) (HCC) 09/2022   Past Surgical History:  Procedure Laterality Date   ABDOMINAL HYSTERECTOMY  08/15/1993   partial   BACK SURGERY  08/15/2013   COLON SURGERY     COLONOSCOPY  06/01/2012   Procedure: COLONOSCOPY;  Surgeon: West Bali, MD;  Location: [facility];  Service: Endoscopy;  Laterality: N/A;  1:30PM   COLONOSCOPY N/A 06/14/2013   NWG:NFAO diverticulosis/normal anastomosis   COLONOSCOPY WITH PROPOFOL N/A 02/18/2020   Procedure: COLONOSCOPY WITH PROPOFOL;  Surgeon: Corbin Ade, MD;  Location: [facility];  Service: Endoscopy;  Laterality: N/A;  10:00am   endoscopic left fallopian tube removed  several yrs ago   EUS  06/06/2012   Procedure: LOWER ENDOSCOPIC ULTRASOUND (EUS);  Surgeon: Willis Modena, MD;  Location: [facility];  Service: Endoscopy;  Laterality: N/A;   EXAMINATION UNDER ANESTHESIA  09/07/2012   Procedure: EXAM UNDER ANESTHESIA;  Surgeon: Almond Lint, MD;   Location: [facility];  Service: General;  Laterality: N/A;   EXCISION/RELEASE BURSA HIP  09/15/2011   Dr Shelle Iron EXCISION/RELEASE BURSA HIP;  Surgeon: Javier Docker, MD;  Location: [facility];  Service: Orthopedics;  Laterality: Left;  Excision of Trochanteric Bursitis   FLEXIBLE SIGMOIDOSCOPY N/A 03/22/2013   Procedure: FLEXIBLE SIGMOIDOSCOPY;  Surgeon: West Bali, MD;  [facility]: [facility];  Service: Endoscopy;  Laterality: N/A;  2:00   LAPAROSCOPIC LOW ANTERIOR RESECTION  07/02/2012   Procedure: LAPAROSCOPIC LOW ANTERIOR RESECTION;  Surgeon: Almond Lint, MD;  Location: [facility];  Service: General;  Laterality: N/A;   left ankle surgery for fx  several yrs ago   LUMBAR LAMINECTOMY/DECOMPRESSION MICRODISCECTOMY N/A 05/14/2014   Procedure: LUMBAR DECOMPRESSION L2-L3;  Surgeon: Javier Docker, MD;  Location: [facility];  Service: Orthopedics;  Laterality: N/A;   MASS EXCISION Left 02/02/2018   Procedure: EXCISION CYST, LEFT INNER THIGH;  Surgeon: Franky Macho, MD;  Location: [facility];  Service: General;  Laterality: Left;   PROCTOSCOPY  09/07/2012   Procedure: PROCTOSCOPY;  Surgeon: Almond Lint, MD;  [facility];  Service: General;  Laterality: N/A;   rotator cuff surg Right    TOTAL SHOULDER ARTHROPLASTY Left 08/28/2023   Procedure: LEFT TOTAL SHOULDER ARTHROPLASTY;  Surgeon: Oliver Barre, MD;  Location: [facility];  Service:  Orthopedics;  Laterality: Left;  NEEDS RNFA   Patient Active Problem List   Diagnosis Date Noted   Glenohumeral arthritis, left 08/28/2023   BMI 31.0-31.9,adult 02/17/2023   Fever blister 10/05/2020   Chronic low back pain 09/01/2017   Migraine without aura 08/10/2017   Sleep disorder 07/10/2017   GERD (gastroesophageal reflux disease) 05/03/2017   HNP (herniated nucleus pulposus), lumbar 05/14/2014   Internal and external prolapsed hemorrhoids 03/15/2013   Anxiety state 03/15/2013   Rectal cancer, cT2N0, 7 cm from anal verge  06/11/2012   Constipation 05/25/2012   REFERRING PROVIDER: Oliver Barre, MD   REFERRING DIAG: Status post total shoulder arthroplasty, left   THERAPY DIAG:  Acute pain of left shoulder  Stiffness of left shoulder, not elsewhere classified  Rationale for Evaluation and Treatment: Rehabilitation  ONSET DATE: 08/28/23  SUBJECTIVE:                                                                                                                                                                                      SUBJECTIVE STATEMENT: Patient reports that she had surgery on her left shoulder on 08/28/23. She has noticed that her pain has been improving since surgery. She has been keeping her arm in her sling most of the day. She will take her arm out of the sling some to let her arm hang.  Hand dominance: Left  PERTINENT HISTORY: History of cancer, anxiety, chronic low back pain, and anxiety  PAIN:  Are you having pain? Yes: NPRS scale: 5-6/10 Pain location: left anterior shoulder to the elbow Pain description: aching and intermittent stinging Aggravating factors: none known Relieving factors: ice and medication  PRECAUTIONS: Shoulder  RED FLAGS: None   WEIGHT BEARING RESTRICTIONS: Yes WB < 5 pounds for 0-12 weeks post op  FALLS:  Has patient fallen in last 6 months? No  LIVING ENVIRONMENT: Lives with: lives with their family Lives in: House/apartment Has following equipment at home:  sling  OCCUPATION: Not working currently; need to be able to lift, push pull, reach overhead up to 40-50 pounds  PLOF: Independent  PATIENT GOALS: be able to use her left arm to clean, lift, and other daily activities  NEXT MD VISIT: 09/12/23  OBJECTIVE:  Note: Objective measures were completed at Evaluation unless otherwise noted.  DIAGNOSTIC FINDINGS: 08/28/23 left shoulder x-ray IMPRESSION: Interval left shoulder arthroplasty without evidence of acute complication.  PATIENT  SURVEYS:  FOTO 4.31  COGNITION: Overall cognitive status: Within functional limits for tasks assessed     SENSATION: Patient reports no numbness or tingling.   UPPER EXTREMITY ROM:   Passive ROM Right  Eval (AROM)  Left  eval  Shoulder flexion 137 85  Shoulder extension    Shoulder abduction 142 60  Shoulder adduction    Shoulder internal rotation To T7   Shoulder external rotation To T3 45  Elbow flexion    Elbow extension    Wrist flexion    Wrist extension    Wrist ulnar deviation    Wrist radial deviation    Wrist pronation    Wrist supination    (Blank rows = not tested)  UPPER EXTREMITY MMT: not tested due to surgical condition  PALPATION:  TTP: right deltoid                                                                                                                             TREATMENT DATE:    PATIENT EDUCATION: Education details: plan of care, healing, prognosis, objective findings, and goals for physical therapy Person educated: Patient Education method: Explanation Education comprehension: verbalized understanding  HOME EXERCISE PROGRAM:   ASSESSMENT:  CLINICAL IMPRESSION: Patient is a 58 y.o. female who was seen today for physical therapy evaluation and treatment following a left total shoulder arthroplasty on 08/28/23.  She presented with moderate pain severity and irritability with passive left shoulder abduction being the most aggravating to her familiar symptoms.  She exhibited moderate left shoulder edema, but no signs or symptoms of a postoperative complication.  Recommend that she continue with skilled physical therapy to address her impairments to return to her prior level of function.   OBJECTIVE IMPAIRMENTS: decreased activity tolerance, decreased ROM, decreased strength, hypomobility, increased edema, impaired tone, impaired UE functional use, and pain.   ACTIVITY LIMITATIONS: carrying, lifting, sleeping, bathing, toileting, dressing,  reach over head, and hygiene/grooming  PARTICIPATION LIMITATIONS: meal prep, cleaning, laundry, shopping, community activity, occupation, and yard work  PERSONAL FACTORS: 3+ comorbidities: History of cancer, anxiety, chronic low back pain, and anxiety  are also affecting patient's functional outcome.   REHAB POTENTIAL: Good  CLINICAL DECISION MAKING: Evolving/moderate complexity  EVALUATION COMPLEXITY: Moderate   GOALS: Goals reviewed with patient? Yes  SHORT TERM GOALS: Target date: 10/03/23  Patient will be independent with her initial HEP.  Baseline: Goal status: INITIAL  2.  Patient will be able to demonstrate at least 125 degrees of passive left shoulder flexion for improved shoulder mobility.  Baseline:  Goal status: INITIAL  3.  Patient will be able to demonstrate at least 110 degrees of passive left shoulder abduction for improved shoulder mobility. Baseline:  Goal status: INITIAL  LONG TERM GOALS: Target date: 10/31/23  Patient will be independent with her advanced HEP.  Baseline:  Goal status: INITIAL  2.  Patient will be able to demonstrate at least 120 degrees of active shoulder flexion for improved function reaching overhead. Baseline:  Goal status: INITIAL  3.  Patient will be able to demonstrate at least 120 degrees of active left shoulder abduction for improved function reaching overhead. Baseline:  Goal status: INITIAL  4.  Patient will be able to carry at least 8 pounds in her left hand for improved function carrying her groceries.  Baseline:  Goal status: INITIAL  5.  Patient will report being able to wash her hair without being limited by her familiar left shoulder symptoms.  Baseline:  Goal status: INITIAL  PLAN:  PT FREQUENCY: 2x/week  PT DURATION: 8 weeks  PLANNED INTERVENTIONS: 97164- PT Re-evaluation, 97110-Therapeutic exercises, 97530- Therapeutic activity, 97112- Neuromuscular re-education, 97535- Self Care, 87564- Manual therapy,  97014- Electrical stimulation (unattended), 97016- Vasopneumatic device, Patient/Family education, Joint mobilization, Cryotherapy, and Moist heat  PLAN FOR NEXT SESSION: see protocol for appropriate exercise progression   Granville Lewis, PT 09/05/2023, 2:03 PM

## 2023-09-07 ENCOUNTER — Ambulatory Visit: Payer: 59

## 2023-09-07 DIAGNOSIS — M25612 Stiffness of left shoulder, not elsewhere classified: Secondary | ICD-10-CM

## 2023-09-07 DIAGNOSIS — M25512 Pain in left shoulder: Secondary | ICD-10-CM | POA: Diagnosis not present

## 2023-09-07 NOTE — Therapy (Signed)
OUTPATIENT PHYSICAL THERAPY SHOULDER TREATMENT   Patient Name: [name] MRN: [id number] DOB:[birth year], 58 y.o., female Today's Date: 09/07/2023  END OF SESSION:  PT End of Session - 09/07/23 0844     Visit Number 2    Number of Visits 16    Date for PT Re-Evaluation 11/10/23    PT Start Time 0800    PT Stop Time 0842    PT Time Calculation (min) 42 min    Activity Tolerance Patient tolerated treatment well    Behavior During Therapy WFL for tasks assessed/performed              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   Closed fracture of left distal fibula 04/27/2018   Constipation    SOME BLOOD IN STOOL   GERD (gastroesophageal reflux disease)    occasionally-NO MEDS   History of kidney stones    PONV (postoperative nausea and vomiting)    used a scop patch last surgery-was better   Sleep disorder 07/10/2017   SVT (supraventricular tachycardia) (HCC) 09/2022   Past Surgical History:  Procedure Laterality Date   ABDOMINAL HYSTERECTOMY  08/15/1993   partial   BACK SURGERY  08/15/2013   COLON SURGERY     COLONOSCOPY  06/01/2012   Procedure: COLONOSCOPY;  Surgeon: West Bali, MD;  [facility]: [facility];  Service: Endoscopy;  Laterality: N/A;  1:30PM   COLONOSCOPY N/A 06/14/2013   CZY:SAYT diverticulosis/normal anastomosis   COLONOSCOPY WITH PROPOFOL N/A 02/18/2020   Procedure: COLONOSCOPY WITH PROPOFOL;  Surgeon: Corbin Ade, MD;  [facility]: [facility];  Service: Endoscopy;  Laterality: N/A;  10:00am   endoscopic left fallopian tube removed  several yrs ago   EUS  06/06/2012   Procedure: LOWER ENDOSCOPIC ULTRASOUND (EUS);  Surgeon: Willis Modena, MD;  Location: [facility];  Service: Endoscopy;  Laterality: N/A;   EXAMINATION UNDER ANESTHESIA  09/07/2012   Procedure: EXAM UNDER ANESTHESIA;  Surgeon: Almond Lint, MD;   Location: [facility];  Service: General;  Laterality: N/A;   EXCISION/RELEASE BURSA HIP  09/15/2011   Dr Shelle Iron EXCISION/RELEASE BURSA HIP;  Surgeon: Javier Docker, MD;  Location: [facility];  Service: Orthopedics;  Laterality: Left;  Excision of Trochanteric Bursitis   FLEXIBLE SIGMOIDOSCOPY N/A 03/22/2013   Procedure: FLEXIBLE SIGMOIDOSCOPY;  Surgeon: West Bali, MD;  [facility];  Service: Endoscopy;  Laterality: N/A;  2:00   LAPAROSCOPIC LOW ANTERIOR RESECTION  07/02/2012   Procedure: LAPAROSCOPIC LOW ANTERIOR RESECTION;  Surgeon: Almond Lint, MD;  Location: [facility];  Service: General;  Laterality: N/A;   left ankle surgery for fx  several yrs ago   LUMBAR LAMINECTOMY/DECOMPRESSION MICRODISCECTOMY N/A 05/14/2014   Procedure: LUMBAR DECOMPRESSION L2-L3;  Surgeon: Javier Docker, MD;  Location: [facility];  Service: Orthopedics;  Laterality: N/A;   MASS EXCISION Left 02/02/2018   Procedure: EXCISION CYST, LEFT INNER THIGH;  Surgeon: Franky Macho, MD;  Location: [facility];  Service: General;  Laterality: Left;   PROCTOSCOPY  09/07/2012   Procedure: PROCTOSCOPY;  Surgeon: Almond Lint, MD;  Location: [facility];  Service: General;  Laterality: N/A;   rotator cuff surg Right    TOTAL SHOULDER ARTHROPLASTY Left 08/28/2023   Procedure: LEFT TOTAL SHOULDER ARTHROPLASTY;  Surgeon: Oliver Barre, MD;  Location: [facility];  Service: Orthopedics;  Laterality: Left;  NEEDS RNFA   Patient Active Problem List   Diagnosis Date Noted   Glenohumeral arthritis, left 08/28/2023   BMI 31.0-31.9,adult 02/17/2023   Fever blister 10/05/2020   Chronic low back pain 09/01/2017   Migraine without aura 08/10/2017   Sleep disorder 07/10/2017   GERD (gastroesophageal reflux disease) 05/03/2017   HNP (herniated nucleus pulposus), lumbar 05/14/2014   Internal and external prolapsed hemorrhoids 03/15/2013   Anxiety state 03/15/2013   Rectal cancer, cT2N0, 7 cm from anal verge  06/11/2012   Constipation 05/25/2012   REFERRING PROVIDER: Oliver Barre, MD   REFERRING DIAG: Status post total shoulder arthroplasty, left   THERAPY DIAG:  Acute pain of left shoulder  Stiffness of left shoulder, not elsewhere classified  Rationale for Evaluation and Treatment: Rehabilitation  ONSET DATE: 08/28/23  SUBJECTIVE:                                                                                                                                                                                      SUBJECTIVE STATEMENT: Patient reports that her shoulder is sore this morning.  Hand dominance: Left  PERTINENT HISTORY: History of cancer, anxiety, chronic low back pain, and anxiety  PAIN:  Are you having pain? Yes: NPRS scale: 4/10 Pain location: left anterior shoulder to the elbow Pain description: aching and intermittent stinging Aggravating factors: none known Relieving factors: ice and medication  PRECAUTIONS: Shoulder  RED FLAGS: None   WEIGHT BEARING RESTRICTIONS: Yes WB < 5 pounds for 0-12 weeks post op  FALLS:  Has patient fallen in last 6 months? No  LIVING ENVIRONMENT: Lives with: lives with their family Lives in: House/apartment Has following equipment at home:  sling  OCCUPATION: Not working currently; need to be able to lift, push pull, reach overhead up to 40-50 pounds  PLOF: Independent  PATIENT GOALS: be able to use her left arm to clean, lift, and other daily activities  NEXT MD VISIT: 09/12/23  OBJECTIVE:  Note: Objective measures were completed at Evaluation unless otherwise noted.  DIAGNOSTIC FINDINGS: 08/28/23 left shoulder x-ray IMPRESSION: Interval left shoulder arthroplasty without evidence of acute complication.  PATIENT SURVEYS:  FOTO 4.31  COGNITION: Overall cognitive status: Within functional limits for tasks assessed     SENSATION: Patient reports no numbness or tingling.   UPPER EXTREMITY ROM:   Passive ROM  Right  Eval (AROM)  Left eval  Shoulder flexion 137 85  Shoulder extension    Shoulder abduction 142 60  Shoulder adduction    Shoulder internal rotation To T7   Shoulder external rotation To T3 45  Elbow flexion  Elbow extension    Wrist flexion    Wrist extension    Wrist ulnar deviation    Wrist radial deviation    Wrist pronation    Wrist supination    (Blank rows = not tested)  UPPER EXTREMITY MMT: not tested due to surgical condition  PALPATION:  TTP: right deltoid                                                                                                                             TREATMENT DATE:                                    09/07/23 EXERCISE LOG  Exercise Repetitions and Resistance Comments  Pendulums  20 reps each  AP and lateral   Scapular retraction  20 reps                 Blank cell = exercise not performed today  Manual Therapy Soft Tissue Mobilization: left rotator cuff, for improved soft tissue extensibility Passive ROM: flexion, ABD, and ER , for improved shoulder mobility    PATIENT EDUCATION: Education details: healing, prognosis, and surgical condition Person educated: Patient Education method: Explanation Education comprehension: verbalized understanding  HOME EXERCISE PROGRAM: [id number]  ASSESSMENT:  CLINICAL IMPRESSION: Treatment was initiated with manual therapy with a focus on passive range of motion and soft tissue mobilization to her left rotator cuff. She experienced the most discomfort with passive left shoulder abduction. This was followed by appropriately matched interventions such as pendulums and scapular retractions. She required minimal cueing with these interventions for proper exercise performance to avoid rotator cuff engagement. She reported that her shoulder felt good upon the conclusion of treatment. She continues to require skilled physical therapy to address her remaining impairments to return to her prior level  of function.   OBJECTIVE IMPAIRMENTS: decreased activity tolerance, decreased ROM, decreased strength, hypomobility, increased edema, impaired tone, impaired UE functional use, and pain.   ACTIVITY LIMITATIONS: carrying, lifting, sleeping, bathing, toileting, dressing, reach over head, and hygiene/grooming  PARTICIPATION LIMITATIONS: meal prep, cleaning, laundry, shopping, community activity, occupation, and yard work  PERSONAL FACTORS: 3+ comorbidities: History of cancer, anxiety, chronic low back pain, and anxiety  are also affecting patient's functional outcome.   REHAB POTENTIAL: Good  CLINICAL DECISION MAKING: Evolving/moderate complexity  EVALUATION COMPLEXITY: Moderate   GOALS: Goals reviewed with patient? Yes  SHORT TERM GOALS: Target date: 10/03/23  Patient will be independent with her initial HEP.  Baseline: Goal status: INITIAL  2.  Patient will be able to demonstrate at least 125 degrees of passive left shoulder flexion for improved shoulder mobility.  Baseline:  Goal status: INITIAL  3.  Patient will be able to demonstrate at least 110 degrees of passive left shoulder abduction for improved shoulder mobility. Baseline:  Goal status: INITIAL  LONG TERM GOALS: Target date: 10/31/23  Patient will be independent with her advanced HEP.  Baseline:  Goal status: INITIAL  2.  Patient will be able to demonstrate at least 120 degrees of active shoulder flexion for improved function reaching overhead. Baseline:  Goal status: INITIAL  3.  Patient will be able to demonstrate at least 120 degrees of active left shoulder abduction for improved function reaching overhead. Baseline:  Goal status: INITIAL  4.  Patient will be able to carry at least 8 pounds in her left hand for improved function carrying her groceries.  Baseline:  Goal status: INITIAL  5.  Patient will report being able to wash her hair without being limited by her familiar left shoulder symptoms.   Baseline:  Goal status: INITIAL  PLAN:  PT FREQUENCY: 2x/week  PT DURATION: 8 weeks  PLANNED INTERVENTIONS: 97164- PT Re-evaluation, 97110-Therapeutic exercises, 97530- Therapeutic activity, 97112- Neuromuscular re-education, 97535- Self Care, 16109- Manual therapy, 97014- Electrical stimulation (unattended), 97016- Vasopneumatic device, Patient/Family education, Joint mobilization, Cryotherapy, and Moist heat  PLAN FOR NEXT SESSION: see protocol for appropriate exercise progression   Granville Lewis, PT 09/07/2023, 11:33 AM

## 2023-09-09 ENCOUNTER — Other Ambulatory Visit: Payer: Self-pay | Admitting: Nurse Practitioner

## 2023-09-09 DIAGNOSIS — F411 Generalized anxiety disorder: Secondary | ICD-10-CM

## 2023-09-10 ENCOUNTER — Other Ambulatory Visit: Payer: Self-pay | Admitting: Nurse Practitioner

## 2023-09-10 DIAGNOSIS — K219 Gastro-esophageal reflux disease without esophagitis: Secondary | ICD-10-CM

## 2023-09-12 ENCOUNTER — Other Ambulatory Visit (INDEPENDENT_AMBULATORY_CARE_PROVIDER_SITE_OTHER): Payer: Self-pay

## 2023-09-12 ENCOUNTER — Encounter: Payer: Self-pay | Admitting: Orthopedic Surgery

## 2023-09-12 ENCOUNTER — Ambulatory Visit: Payer: 59

## 2023-09-12 ENCOUNTER — Ambulatory Visit: Payer: 59 | Admitting: Orthopedic Surgery

## 2023-09-12 DIAGNOSIS — M25512 Pain in left shoulder: Secondary | ICD-10-CM

## 2023-09-12 DIAGNOSIS — Z96612 Presence of left artificial shoulder joint: Secondary | ICD-10-CM

## 2023-09-12 DIAGNOSIS — M19012 Primary osteoarthritis, left shoulder: Secondary | ICD-10-CM

## 2023-09-12 DIAGNOSIS — M25612 Stiffness of left shoulder, not elsewhere classified: Secondary | ICD-10-CM

## 2023-09-12 MED ORDER — OXYCODONE HCL 5 MG PO TABS: 5.0000 mg | ORAL_TABLET | Freq: Four times a day (QID) | ORAL | 0 refills | Status: AC | PRN

## 2023-09-12 NOTE — Patient Instructions (Signed)
Per [facility] clinic policy, our goal is ensure optimal postoperative pain control with a multimodal pain management strategy. For all [facility] patients, our goal is to wean post-operative narcotic medications by 6 weeks post-operatively. If this is not possible due to utilization of pain medication prior to surgery, your [facility] doctor will support your acute post-operative pain control for the first 6 weeks postoperatively, with a plan to transition you back to your primary pain team following that. Cyndia Skeeters will work to ensure a Therapist, occupational.

## 2023-09-12 NOTE — Therapy (Signed)
OUTPATIENT PHYSICAL THERAPY SHOULDER TREATMENT   Patient Name: [name] MRN: [id number] DOB:Jan 11, 1966, 58 y.o., female Today's Date: 09/12/2023  END OF SESSION:  PT End of Session - 09/12/23 0935     Visit Number 3    Number of Visits 16    Date for PT Re-Evaluation 11/10/23    PT Start Time 0930    PT Stop Time 1011    PT Time Calculation (min) 41 min    Activity Tolerance Patient tolerated treatment well    Behavior During Therapy WFL for tasks assessed/performed              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   Closed fracture of left distal fibula 04/27/2018   Constipation    SOME BLOOD IN STOOL   GERD (gastroesophageal reflux disease)    occasionally-NO MEDS   History of kidney stones    PONV (postoperative nausea and vomiting)    used a scop patch last surgery-was better   Sleep disorder 07/10/2017   SVT (supraventricular tachycardia) (HCC) 09/2022   Past Surgical History:  Procedure Laterality Date   ABDOMINAL HYSTERECTOMY  08/15/1993   partial   BACK SURGERY  08/15/2013   COLON SURGERY     COLONOSCOPY  06/01/2012   Procedure: COLONOSCOPY;  Surgeon: West Bali, MD;  Location: [facility];  Service: Endoscopy;  Laterality: N/A;  1:30PM   COLONOSCOPY N/A 06/14/2013   WUJ:WJXB diverticulosis/normal anastomosis   COLONOSCOPY WITH PROPOFOL N/A 02/18/2020   Procedure: COLONOSCOPY WITH PROPOFOL;  Surgeon: Corbin Ade, MD;  Location: [facility];  Service: Endoscopy;  Laterality: N/A;  10:00am   endoscopic left fallopian tube removed  several yrs ago   EUS  06/06/2012   Procedure: LOWER ENDOSCOPIC ULTRASOUND (EUS);  Surgeon: Willis Modena, MD;  Location: [facility];  Service: Endoscopy;  Laterality: N/A;   EXAMINATION UNDER ANESTHESIA  09/07/2012   Procedure: EXAM UNDER ANESTHESIA;  Surgeon: Almond Lint, MD;   Location: [facility];  Service: General;  Laterality: N/A;   EXCISION/RELEASE BURSA HIP  09/15/2011   Dr Shelle Iron EXCISION/RELEASE BURSA HIP;  Surgeon: Javier Docker, MD;  Location: [facility];  Service: Orthopedics;  Laterality: Left;  Excision of Trochanteric Bursitis   FLEXIBLE SIGMOIDOSCOPY N/A 03/22/2013   Procedure: FLEXIBLE SIGMOIDOSCOPY;  Surgeon: West Bali, MD;  [facility]: [facility];  Service: Endoscopy;  Laterality: N/A;  2:00   LAPAROSCOPIC LOW ANTERIOR RESECTION  07/02/2012   Procedure: LAPAROSCOPIC LOW ANTERIOR RESECTION;  Surgeon: Almond Lint, MD;  Location: [facility];  Service: General;  Laterality: N/A;   left ankle surgery for fx  several yrs ago   LUMBAR LAMINECTOMY/DECOMPRESSION MICRODISCECTOMY N/A 05/14/2014   Procedure: LUMBAR DECOMPRESSION L2-L3;  Surgeon: Javier Docker, MD;  Location: [facility];  Service: Orthopedics;  Laterality: N/A;   MASS EXCISION Left 02/02/2018   Procedure: EXCISION CYST, LEFT INNER THIGH;  Surgeon: Franky Macho, MD;  Location: [facility];  Service: General;  Laterality: Left;   PROCTOSCOPY  09/07/2012   Procedure: PROCTOSCOPY;  Surgeon: Almond Lint, MD;  Location: [facility];  Service: General;  Laterality: N/A;   rotator cuff surg Right    TOTAL SHOULDER ARTHROPLASTY Left 08/28/2023   Procedure: LEFT TOTAL SHOULDER ARTHROPLASTY;  Surgeon: Oliver Barre, MD;  Location: [facility];  Service: Orthopedics;  Laterality: Left;  NEEDS RNFA   Patient Active Problem List   Diagnosis Date Noted   Glenohumeral arthritis, left 08/28/2023   BMI 31.0-31.9,adult 02/17/2023   Fever blister 10/05/2020   Chronic low back pain 09/01/2017   Migraine without aura 08/10/2017   Sleep disorder 07/10/2017   GERD (gastroesophageal reflux disease) 05/03/2017   HNP (herniated nucleus pulposus), lumbar 05/14/2014   Internal and external prolapsed hemorrhoids 03/15/2013   Anxiety state 03/15/2013   Rectal cancer, cT2N0, 7 cm from anal verge  06/11/2012   Constipation 05/25/2012   REFERRING PROVIDER: Oliver Barre, MD   REFERRING DIAG: Status post total shoulder arthroplasty, left   THERAPY DIAG:  Acute pain of left shoulder  Stiffness of left shoulder, not elsewhere classified  Rationale for Evaluation and Treatment: Rehabilitation  ONSET DATE: 08/28/23  SUBJECTIVE:                                                                                                                                                                                      SUBJECTIVE STATEMENT: Patient reports 2/10 left shoulder pain today.  Pt has MD appointment later this morning.  Hand dominance: Left  PERTINENT HISTORY: History of cancer, anxiety, chronic low back pain, and anxiety  PAIN:  Are you having pain? Yes: NPRS scale: 2/10 Pain location: left anterior shoulder to the elbow Pain description: aching and intermittent stinging Aggravating factors: none known Relieving factors: ice and medication  PRECAUTIONS: Shoulder  RED FLAGS: None   WEIGHT BEARING RESTRICTIONS: Yes WB < 5 pounds for 0-12 weeks post op  FALLS:  Has patient fallen in last 6 months? No  LIVING ENVIRONMENT: Lives with: lives with their family Lives in: House/apartment Has following equipment at home:  sling  OCCUPATION: Not working currently; need to be able to lift, push pull, reach overhead up to 40-50 pounds  PLOF: Independent  PATIENT GOALS: be able to use her left arm to clean, lift, and other daily activities  NEXT MD VISIT: 09/12/23  OBJECTIVE:  Note: Objective measures were completed at Evaluation unless otherwise noted.  DIAGNOSTIC FINDINGS: 08/28/23 left shoulder x-ray IMPRESSION: Interval left shoulder arthroplasty without evidence of acute complication.  PATIENT SURVEYS:  FOTO 4.31  COGNITION: Overall cognitive status: Within functional limits for tasks assessed     SENSATION: Patient reports no numbness or tingling.   UPPER  EXTREMITY ROM:   Passive ROM Right  Eval (AROM)  Left eval  Shoulder flexion 137 85  Shoulder extension    Shoulder abduction 142 60  Shoulder adduction    Shoulder internal rotation To T7   Shoulder external rotation To T3 45  Elbow flexion    Elbow extension    Wrist flexion    Wrist extension    Wrist ulnar deviation    Wrist radial deviation    Wrist pronation    Wrist supination    (Blank rows = not tested)  UPPER EXTREMITY MMT: not tested due to surgical condition  PALPATION:  TTP: right deltoid                                                                                                                             TREATMENT DATE:                                    09/07/23 EXERCISE LOG  Exercise Repetitions and Resistance Comments  Pendulums  20 reps each  AP and lateral   Scapular retraction  20 reps    Pulleys 4 mins   Ranger Flex/ext; CW and CCW circles x 2 mins each        Blank cell = exercise not performed today  Manual Therapy Soft Tissue Mobilization: left shoulder, STW/M to left deltoid and bicep to decrease pain and tone Passive ROM: flexion, ABD, and ER , for improved shoulder mobility    PATIENT EDUCATION: Education details: healing, prognosis, and surgical condition Person educated: Patient Education method: Explanation Education comprehension: verbalized understanding  HOME EXERCISE PROGRAM: [id number]  ASSESSMENT:  CLINICAL IMPRESSION: Pt arrives for today's treatment session reporting 2/10 left shoulder pain.  Pt states that she has a MD appointment later today and hopes to get her aquacel removed.  Pt introduced to PROM/AAROM exercises today to increase ROM and function.  Pt able to demonstrate 133 degrees of left shoulder flexion on pulleys and 128 degrees of left shoulder abduction while in supine.  STW/M performed to left bicep and deltoid to decrease pain and tone.  PROM performed in all directions mentioned above with gentle hold at  end range.  Pt denied any change in pain at completion of today's treatment session.  OBJECTIVE IMPAIRMENTS: decreased activity tolerance, decreased ROM, decreased strength, hypomobility, increased edema, impaired tone, impaired UE functional use, and pain.   ACTIVITY LIMITATIONS: carrying, lifting, sleeping, bathing, toileting, dressing, reach over head, and hygiene/grooming  PARTICIPATION LIMITATIONS: meal prep, cleaning, laundry, shopping, community activity, occupation, and yard work  PERSONAL FACTORS: 3+ comorbidities: History of cancer, anxiety, chronic low back pain, and anxiety  are also affecting patient's functional outcome.   REHAB POTENTIAL: Good  CLINICAL DECISION MAKING: Evolving/moderate complexity  EVALUATION COMPLEXITY: Moderate   GOALS: Goals reviewed with patient? Yes  SHORT TERM GOALS: Target date: 10/03/23  Patient will be independent with her initial HEP.  Baseline: Goal status: MET  2.  Patient will be able to demonstrate at least 125 degrees of passive left shoulder flexion for improved shoulder mobility.  Baseline: 1/28: 133 degrees (pulleys) Goal status: MET  3.  Patient  will be able to demonstrate at least 110 degrees of passive left shoulder abduction for improved shoulder mobility. Baseline: 1/28: 128 degrees Goal status: MET  LONG TERM GOALS: Target date: 10/31/23  Patient will be independent with her advanced HEP.  Baseline:  Goal status: INITIAL  2.  Patient will be able to demonstrate at least 120 degrees of active shoulder flexion for improved function reaching overhead. Baseline:  Goal status: INITIAL  3.  Patient will be able to demonstrate at least 120 degrees of active left shoulder abduction for improved function reaching overhead. Baseline:  Goal status: INITIAL  4.  Patient will be able to carry at least 8 pounds in her left hand for improved function carrying her groceries.  Baseline:  Goal status: INITIAL  5.  Patient will  report being able to wash her hair without being limited by her familiar left shoulder symptoms.  Baseline:  Goal status: INITIAL  PLAN:  PT FREQUENCY: 2x/week  PT DURATION: 8 weeks  PLANNED INTERVENTIONS: 97164- PT Re-evaluation, 97110-Therapeutic exercises, 97530- Therapeutic activity, 97112- Neuromuscular re-education, 97535- Self Care, [id number]- Manual therapy, 97014- Electrical stimulation (unattended), 97016- Vasopneumatic device, Patient/Family education, Joint mobilization, Cryotherapy, and Moist heat  PLAN FOR NEXT SESSION: see protocol for appropriate exercise progression   Newman Pies, PTA 09/12/2023, 11:12 AM

## 2023-09-12 NOTE — Progress Notes (Signed)
Orthopaedic Postop Note  Assessment: [name] is a 58 y.o. female s/p Left Anatomic Total Shoulder Arthroplasty  DOS: 08/28/2023  Plan: Sutures were trimmed, steri strips were placed Ok to remove the abduction pillow; can stop using the sling around 4 weeks postop Physical therapy plan discussed.  Continue to work with therapy.  Focusing on passive range of motion. Anticipated progression discussed, XR reviewed in clinic Medication as needed. Follow up 4 weeks   Meds ordered this encounter  Medications   oxyCODONE (ROXICODONE) 5 MG immediate release tablet    Sig: Take 1 tablet (5 mg total) by mouth every 6 (six) hours as needed for up to 7 days.    Dispense:  20 tablet    Refill:  0     Follow-up: Return in about 4 weeks (around 10/10/2023).  XR at next visit: Left shoulder  Subjective:  Chief Complaint  Patient presents with   Post-op Follow-up    History of Present Illness: [name] is a 58 y.o. female who presents following the above stated procedure.  Surgery was approximately 2 weeks ago.  She is doing very well.  She continues to use the ice machine.  She is taking medicines as needed.  She uses narcotics occasionally.  She has initiated therapy.  She is progressing appropriately.  Review of Systems: No fevers or chills No numbness or tingling No Chest Pain No shortness of breath   Objective: There were no vitals taken for this visit.  Physical Exam:  Alert and oriented, no acute distress  Surgical incision is healing well, no surrounding erythema or drainage Sensation intact in the axillary nerve distribution Active motion intact in the hand 2+ radial pulse Sensation intact throughout the hand Tolerates gentle range of motion of the shoulder - passive forward flexion to 130, passive abduction to 90   IMAGING: I personally ordered and reviewed the following images:  XR of the Left shoulder was obtained in clinic today and  demonstrate Anatomic Total Shoulder Arthroplasty  with implants in good position.  No evidence of acute injury or subsidence of implants.  Alignment remains unchanged compared to immediate postop XR.  Impression: Left shoulder arthroplasty in good position   Oliver Barre, MD 09/12/2023 11:29 AM

## 2023-09-14 ENCOUNTER — Ambulatory Visit: Payer: 59

## 2023-09-14 DIAGNOSIS — M25612 Stiffness of left shoulder, not elsewhere classified: Secondary | ICD-10-CM

## 2023-09-14 DIAGNOSIS — M25512 Pain in left shoulder: Secondary | ICD-10-CM | POA: Diagnosis not present

## 2023-09-14 NOTE — Therapy (Signed)
OUTPATIENT PHYSICAL THERAPY SHOULDER TREATMENT   Patient Name: [name] MRN: [id number] DOB:[birth year], 58 y.o., female Today's Date: 09/14/2023  END OF SESSION:  PT End of Session - 09/14/23 0848     Visit Number 4    Number of Visits 16    Date for PT Re-Evaluation 11/10/23    PT Start Time 0845    PT Stop Time 0931    PT Time Calculation (min) 46 min    Activity Tolerance Patient tolerated treatment well    Behavior During Therapy WFL for tasks assessed/performed              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   Closed fracture of left distal fibula 04/27/2018   Constipation    SOME BLOOD IN STOOL   GERD (gastroesophageal reflux disease)    occasionally-NO MEDS   History of kidney stones    PONV (postoperative nausea and vomiting)    used a scop patch last surgery-was better   Sleep disorder 07/10/2017   SVT (supraventricular tachycardia) (HCC) 09/2022   Past Surgical History:  Procedure Laterality Date   ABDOMINAL HYSTERECTOMY  08/15/1993   partial   BACK SURGERY  08/15/2013   COLON SURGERY     COLONOSCOPY  06/01/2012   Procedure: COLONOSCOPY;  Surgeon: West Bali, MD;  [facility]: [facility];  Service: Endoscopy;  Laterality: N/A;  1:30PM   COLONOSCOPY N/A 06/14/2013   JOA:CZYS diverticulosis/normal anastomosis   COLONOSCOPY WITH PROPOFOL N/A 02/18/2020   Procedure: COLONOSCOPY WITH PROPOFOL;  Surgeon: Corbin Ade, MD;  [facility]: [facility];  Service: Endoscopy;  Laterality: N/A;  10:00am   endoscopic left fallopian tube removed  several yrs ago   EUS  06/06/2012   Procedure: LOWER ENDOSCOPIC ULTRASOUND (EUS);  Surgeon: Willis Modena, MD;  Location: [facility];  Service: Endoscopy;  Laterality: N/A;   EXAMINATION UNDER ANESTHESIA  09/07/2012   Procedure: EXAM UNDER ANESTHESIA;  Surgeon: Almond Lint, MD;   Location: [facility];  Service: General;  Laterality: N/A;   EXCISION/RELEASE BURSA HIP  09/15/2011   Dr Shelle Iron EXCISION/RELEASE BURSA HIP;  Surgeon: Javier Docker, MD;  Location: [facility];  Service: Orthopedics;  Laterality: Left;  Excision of Trochanteric Bursitis   FLEXIBLE SIGMOIDOSCOPY N/A 03/22/2013   Procedure: FLEXIBLE SIGMOIDOSCOPY;  Surgeon: West Bali, MD;  [facility];  Service: Endoscopy;  Laterality: N/A;  2:00   LAPAROSCOPIC LOW ANTERIOR RESECTION  07/02/2012   Procedure: LAPAROSCOPIC LOW ANTERIOR RESECTION;  Surgeon: Almond Lint, MD;  Location: [facility];  Service: General;  Laterality: N/A;   left ankle surgery for fx  several yrs ago   LUMBAR LAMINECTOMY/DECOMPRESSION MICRODISCECTOMY N/A 05/14/2014   Procedure: LUMBAR DECOMPRESSION L2-L3;  Surgeon: Javier Docker, MD;  Location: [facility];  Service: Orthopedics;  Laterality: N/A;   MASS EXCISION Left 02/02/2018   Procedure: EXCISION CYST, LEFT INNER THIGH;  Surgeon: Franky Macho, MD;  Location: [facility];  Service: General;  Laterality: Left;   PROCTOSCOPY  09/07/2012   Procedure: PROCTOSCOPY;  Surgeon: Almond Lint, MD;  Location: [facility];  Service: General;  Laterality: N/A;   rotator cuff surg Right    TOTAL SHOULDER ARTHROPLASTY Left 08/28/2023   Procedure: LEFT TOTAL SHOULDER ARTHROPLASTY;  Surgeon: Oliver Barre, MD;  Location: [facility];  Service: Orthopedics;  Laterality: Left;  NEEDS RNFA   Patient Active Problem List   Diagnosis Date Noted   Glenohumeral arthritis, left 08/28/2023   BMI 31.0-31.9,adult 02/17/2023   Fever blister 10/05/2020   Chronic low back pain 09/01/2017   Migraine without aura 08/10/2017   Sleep disorder 07/10/2017   GERD (gastroesophageal reflux disease) 05/03/2017   HNP (herniated nucleus pulposus), lumbar 05/14/2014   Internal and external prolapsed hemorrhoids 03/15/2013   Anxiety state 03/15/2013   Rectal cancer, cT2N0, 7 cm from anal verge  06/11/2012   Constipation 05/25/2012   REFERRING PROVIDER: Oliver Barre, MD   REFERRING DIAG: Status post total shoulder arthroplasty, left   THERAPY DIAG:  Acute pain of left shoulder  Stiffness of left shoulder, not elsewhere classified  Rationale for Evaluation and Treatment: Rehabilitation  ONSET DATE: 08/28/23  SUBJECTIVE:                                                                                                                                                                                      SUBJECTIVE STATEMENT: Patient reports 1/10 left shoulder pain today.  MD pleased with progress.  Hand dominance: Left  PERTINENT HISTORY: History of cancer, anxiety, chronic low back pain, and anxiety  PAIN:  Are you having pain? Yes: NPRS scale: 1/10 Pain location: left anterior shoulder to the elbow Pain description: aching and intermittent stinging Aggravating factors: none known Relieving factors: ice and medication  PRECAUTIONS: Shoulder  RED FLAGS: None   WEIGHT BEARING RESTRICTIONS: Yes WB < 5 pounds for 0-12 weeks post op  FALLS:  Has patient fallen in last 6 months? No  LIVING ENVIRONMENT: Lives with: lives with their family Lives in: House/apartment Has following equipment at home:  sling  OCCUPATION: Not working currently; need to be able to lift, push pull, reach overhead up to 40-50 pounds  PLOF: Independent  PATIENT GOALS: be able to use her left arm to clean, lift, and other daily activities  NEXT MD VISIT: 09/12/23  OBJECTIVE:  Note: Objective measures were completed at Evaluation unless otherwise noted.  DIAGNOSTIC FINDINGS: 08/28/23 left shoulder x-ray IMPRESSION: Interval left shoulder arthroplasty without evidence of acute complication.  PATIENT SURVEYS:  FOTO 4.31  COGNITION: Overall cognitive status: Within functional limits for tasks assessed     SENSATION: Patient reports no numbness or tingling.   UPPER EXTREMITY ROM:    Passive ROM Right  Eval (AROM)  Left eval  Shoulder flexion 137 85  Shoulder extension    Shoulder abduction 142 60  Shoulder adduction    Shoulder internal rotation To T7   Shoulder external rotation To T3 45  Elbow flexion  Elbow extension    Wrist flexion    Wrist extension    Wrist ulnar deviation    Wrist radial deviation    Wrist pronation    Wrist supination    (Blank rows = not tested)  UPPER EXTREMITY MMT: not tested due to surgical condition  PALPATION:  TTP: right deltoid                                                                                                                             TREATMENT DATE:                                    09/14/23 EXERCISE LOG  Exercise Repetitions and Resistance Comments  Iso Flexion 5 sec x 10 reps AP and lateral   Iso Abduction 5 sec x 10 reps   Iso ER 5 sec x 10 reps   Wall Ladder 5 reps (max # 27)   Scapular retraction  20 reps    Pulleys 6 mins   Ranger Flex/ext; CW and CCW circles x 2 mins each        Blank cell = exercise not performed today  Manual Therapy Soft Tissue Mobilization: left shoulder, STW/M to left deltoid and bicep to decrease pain and tone Passive ROM: flexion, ABD, and ER , for improved shoulder mobility    PATIENT EDUCATION: Education details: healing, prognosis, and surgical condition Person educated: Patient Education method: Explanation Education comprehension: verbalized understanding  HOME EXERCISE PROGRAM: [id number]  ASSESSMENT:  CLINICAL IMPRESSION: Pt arrives for today's treatment session reporting 1/10 left shoulder pain.  Pt reports that MD is pleased with her progress at this time.  Pt introduced to wall ladder today with pt able to reach a max number of 27 today.   Pt also introduced to isometric left shoulder flexion, abduction, and ER with min cues for proper technique and to not push at 100%.  STW/M and PROM performed to left shoulder today to increase ROM and function,  while decreasing pain.  Pt denied any pain at completion of today's treatment session.  OBJECTIVE IMPAIRMENTS: decreased activity tolerance, decreased ROM, decreased strength, hypomobility, increased edema, impaired tone, impaired UE functional use, and pain.   ACTIVITY LIMITATIONS: carrying, lifting, sleeping, bathing, toileting, dressing, reach over head, and hygiene/grooming  PARTICIPATION LIMITATIONS: meal prep, cleaning, laundry, shopping, community activity, occupation, and yard work  PERSONAL FACTORS: 3+ comorbidities: History of cancer, anxiety, chronic low back pain, and anxiety  are also affecting patient's functional outcome.   REHAB POTENTIAL: Good  CLINICAL DECISION MAKING: Evolving/moderate complexity  EVALUATION COMPLEXITY: Moderate   GOALS: Goals reviewed with patient? Yes  SHORT TERM GOALS: Target date: 10/03/23  Patient will be independent with her initial HEP.  Baseline: Goal status: MET  2.  Patient will be able to demonstrate at least 125 degrees of passive left shoulder flexion for improved shoulder  mobility.  Baseline: 1/28: 133 degrees (pulleys) Goal status: MET  3.  Patient will be able to demonstrate at least 110 degrees of passive left shoulder abduction for improved shoulder mobility. Baseline: 1/28: 128 degrees Goal status: MET  LONG TERM GOALS: Target date: 10/31/23  Patient will be independent with her advanced HEP.  Baseline:  Goal status: INITIAL  2.  Patient will be able to demonstrate at least 120 degrees of active shoulder flexion for improved function reaching overhead. Baseline:  Goal status: INITIAL  3.  Patient will be able to demonstrate at least 120 degrees of active left shoulder abduction for improved function reaching overhead. Baseline:  Goal status: INITIAL  4.  Patient will be able to carry at least 8 pounds in her left hand for improved function carrying her groceries.  Baseline:  Goal status: INITIAL  5.  Patient will  report being able to wash her hair without being limited by her familiar left shoulder symptoms.  Baseline:  Goal status: INITIAL  PLAN:  PT FREQUENCY: 2x/week  PT DURATION: 8 weeks  PLANNED INTERVENTIONS: 97164- PT Re-evaluation, 97110-Therapeutic exercises, 97530- Therapeutic activity, 97112- Neuromuscular re-education, 97535- Self Care, 19147- Manual therapy, 97014- Electrical stimulation (unattended), 97016- Vasopneumatic device, Patient/Family education, Joint mobilization, Cryotherapy, and Moist heat  PLAN FOR NEXT SESSION: see protocol for appropriate exercise progression   Newman Pies, PTA 09/14/2023, 10:22 AM

## 2023-09-16 ENCOUNTER — Other Ambulatory Visit: Payer: Self-pay | Admitting: Nurse Practitioner

## 2023-09-19 ENCOUNTER — Telehealth: Payer: Self-pay | Admitting: Orthopedic Surgery

## 2023-09-19 ENCOUNTER — Ambulatory Visit: Payer: 59 | Attending: Orthopedic Surgery

## 2023-09-19 DIAGNOSIS — M25612 Stiffness of left shoulder, not elsewhere classified: Secondary | ICD-10-CM | POA: Diagnosis present

## 2023-09-19 DIAGNOSIS — M25512 Pain in left shoulder: Secondary | ICD-10-CM | POA: Insufficient documentation

## 2023-09-19 NOTE — Telephone Encounter (Signed)
Called pt and relayed information from provider. Pt verbalized understanding and will call back if burning persists or gets worse.

## 2023-09-19 NOTE — Telephone Encounter (Signed)
 Dr. Onesimo pt - spoke w/the pt, she had surgery 08/28/23 on her lt shoulder.  She stated that she is having burning down into her shoulder.  She stated she dealt w/it all weekend.  She is at PT right now, offered her an appointment this morning after PT and she declined.  She asked that a nurse call her back.  [phone]

## 2023-09-19 NOTE — Therapy (Signed)
 OUTPATIENT PHYSICAL THERAPY SHOULDER TREATMENT   Patient Name: [name] MRN: [id number] DOB:Jan 20, 1966, 58 y.o., female Today's Date: 09/19/2023  END OF SESSION:  PT End of Session - 09/19/23 0858     Visit Number 5    Number of Visits 16    Date for PT Re-Evaluation 11/10/23    PT Start Time 0845    PT Stop Time 0944    PT Time Calculation (min) 59 min    Activity Tolerance Patient tolerated treatment well    Behavior During Therapy WFL for tasks assessed/performed              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   Closed fracture of left distal fibula 04/27/2018   Constipation    SOME BLOOD IN STOOL   GERD (gastroesophageal reflux disease)    occasionally-NO MEDS   History of kidney stones    PONV (postoperative nausea and vomiting)    used a scop patch last surgery-was better   Sleep disorder 07/10/2017   SVT (supraventricular tachycardia) (HCC) 09/2022   Past Surgical History:  Procedure Laterality Date   ABDOMINAL HYSTERECTOMY  08/15/1993   partial   BACK SURGERY  08/15/2013   COLON SURGERY     COLONOSCOPY  06/01/2012   Procedure: COLONOSCOPY;  Surgeon: Margo LITTIE Haddock, MD;  Location: [facility];  Service: Endoscopy;  Laterality: N/A;  1:30PM   COLONOSCOPY N/A 06/14/2013   DOQ:fpoi diverticulosis/normal anastomosis   COLONOSCOPY WITH PROPOFOL  N/A 02/18/2020   Procedure: COLONOSCOPY WITH PROPOFOL ;  Surgeon: Shaaron Lamar HERO, MD;  Location: [facility];  Service: Endoscopy;  Laterality: N/A;  10:00am   endoscopic left fallopian tube removed  several yrs ago   EUS  06/06/2012   Procedure: LOWER ENDOSCOPIC ULTRASOUND (EUS);  Surgeon: Elsie Cree, MD;  Location: [facility];  Service: Endoscopy;  Laterality: N/A;   EXAMINATION UNDER ANESTHESIA  09/07/2012   Procedure: EXAM UNDER ANESTHESIA;  Surgeon: Jina Nephew, MD;   Location: [facility];  Service: General;  Laterality: N/A;   EXCISION/RELEASE BURSA HIP  09/15/2011   Dr Duwayne EXCISION/RELEASE BURSA HIP;  Surgeon: Reyes JAYSON Duwayne, MD;  Location: [facility];  Service: Orthopedics;  Laterality: Left;  Excision of Trochanteric Bursitis   FLEXIBLE SIGMOIDOSCOPY N/A 03/22/2013   Procedure: FLEXIBLE SIGMOIDOSCOPY;  Surgeon: Margo LITTIE Haddock, MD;  Location: [facility];  Service: Endoscopy;  Laterality: N/A;  2:00   LAPAROSCOPIC LOW ANTERIOR RESECTION  07/02/2012   Procedure: LAPAROSCOPIC LOW ANTERIOR RESECTION;  Surgeon: Jina Nephew, MD;  Location: [facility];  Service: General;  Laterality: N/A;   left ankle surgery for fx  several yrs ago   LUMBAR LAMINECTOMY/DECOMPRESSION MICRODISCECTOMY N/A 05/14/2014   Procedure: LUMBAR DECOMPRESSION L2-L3;  Surgeon: Reyes JAYSON Duwayne, MD;  Location: [facility];  Service: Orthopedics;  Laterality: N/A;   MASS EXCISION Left 02/02/2018   Procedure: EXCISION CYST, LEFT INNER THIGH;  Surgeon: Mavis Anes, MD;  Location: [facility];  Service: General;  Laterality: Left;   PROCTOSCOPY  09/07/2012   Procedure: PROCTOSCOPY;  Surgeon: Jina Nephew, MD;  Location: [facility];  Service: General;  Laterality: N/A;   rotator cuff surg Right    TOTAL SHOULDER ARTHROPLASTY Left 08/28/2023   Procedure: LEFT TOTAL SHOULDER ARTHROPLASTY;  Surgeon: Onesimo Anes LABOR, MD;  Location: [facility];  Service: Orthopedics;  Laterality: Left;  NEEDS RNFA   Patient Active Problem List   Diagnosis Date Noted   Glenohumeral arthritis, left 08/28/2023   BMI 31.0-31.9,adult 02/17/2023   Fever blister 10/05/2020   Chronic low back pain 09/01/2017   Migraine without aura 08/10/2017   Sleep disorder 07/10/2017   GERD (gastroesophageal reflux disease) 05/03/2017   HNP (herniated nucleus pulposus), lumbar 05/14/2014   Internal and external prolapsed hemorrhoids 03/15/2013   Anxiety state 03/15/2013   Rectal cancer, cT2N0, 7 cm from anal verge  06/11/2012   Constipation 05/25/2012   REFERRING PROVIDER: Onesimo Oneil LABOR, MD   REFERRING DIAG: Status post total shoulder arthroplasty, left   THERAPY DIAG:  Acute pain of left shoulder  Stiffness of left shoulder, not elsewhere classified  Rationale for Evaluation and Treatment: Rehabilitation  ONSET DATE: 08/28/23  SUBJECTIVE:                                                                                                                                                                                      SUBJECTIVE STATEMENT: Patient reports 3-4/10 left shoulder pain today.  Pt states that she may have over done things over the weekend.  Hand dominance: Left  PERTINENT HISTORY: History of cancer, anxiety, chronic low back pain, and anxiety  PAIN:  Are you having pain? Yes: NPRS scale: 3-4/10 Pain location: left anterior shoulder to the elbow Pain description: aching and intermittent stinging Aggravating factors: none known Relieving factors: ice and medication  PRECAUTIONS: Shoulder  RED FLAGS: None   WEIGHT BEARING RESTRICTIONS: Yes WB < 5 pounds for 0-12 weeks post op  FALLS:  Has patient fallen in last 6 months? No  LIVING ENVIRONMENT: Lives with: lives with their family Lives in: House/apartment Has following equipment at home:  sling  OCCUPATION: Not working currently; need to be able to lift, push pull, reach overhead up to 40-50 pounds  PLOF: Independent  PATIENT GOALS: be able to use her left arm to clean, lift, and other daily activities  NEXT MD VISIT: 09/12/23  OBJECTIVE:  Note: Objective measures were completed at Evaluation unless otherwise noted.  DIAGNOSTIC FINDINGS: 08/28/23 left shoulder x-ray IMPRESSION: Interval left shoulder arthroplasty without evidence of acute complication.  PATIENT SURVEYS:  FOTO 4.31  COGNITION: Overall cognitive status: Within functional limits for tasks assessed     SENSATION: Patient reports no  numbness or tingling.   UPPER EXTREMITY ROM:   Passive ROM Right  Eval (AROM)  Left eval  Shoulder flexion 137 85  Shoulder extension    Shoulder abduction 142 60  Shoulder adduction    Shoulder internal rotation To T7   Shoulder  external rotation To T3 45  Elbow flexion    Elbow extension    Wrist flexion    Wrist extension    Wrist ulnar deviation    Wrist radial deviation    Wrist pronation    Wrist supination    (Blank rows = not tested)  UPPER EXTREMITY MMT: not tested due to surgical condition  PALPATION:  TTP: right deltoid                                                                                                                             TREATMENT DATE:                                    09/14/23 EXERCISE LOG  Exercise Repetitions and Resistance Comments  Iso Flexion  AP and lateral   Iso Abduction    Iso ER    Wall Wash 5 mins   Wall Ladder 5 reps (max # 27)   Scapular retraction     Pulleys 6 mins   Ranger Flex/ext; CW and CCW circles x 2 mins each   PROM Flexion and ER with gentle hold at end range    Blank cell = exercise not performed today   Manual Therapy Soft Tissue Mobilization: left shoulder, STW/M to left deltoid and bicep to decrease pain and tone      PATIENT EDUCATION: Education details: healing, prognosis, and surgical condition Person educated: Patient Education method: Explanation Education comprehension: verbalized understanding  HOME EXERCISE PROGRAM: [id number]  ASSESSMENT:  CLINICAL IMPRESSION: Pt arrives for today's treatment session reporting 3-4/10 left shoulder pain.  Pt reports that she may have over done her exercises over the weekend which caused her increase in pain.  Pt instructed in the importance of letting her shoulder pain be her guide when performing exercises at home.  STW/M performed to left deltoid and bicep to decrease pain and tone.  Normal responses to estim and vaso noted upon removal.  Pt reported  decreased pain at completion of today's treatment session.  OBJECTIVE IMPAIRMENTS: decreased activity tolerance, decreased ROM, decreased strength, hypomobility, increased edema, impaired tone, impaired UE functional use, and pain.   ACTIVITY LIMITATIONS: carrying, lifting, sleeping, bathing, toileting, dressing, reach over head, and hygiene/grooming  PARTICIPATION LIMITATIONS: meal prep, cleaning, laundry, shopping, community activity, occupation, and yard work  PERSONAL FACTORS: 3+ comorbidities: History of cancer, anxiety, chronic low back pain, and anxiety  are also affecting patient's functional outcome.   REHAB POTENTIAL: Good  CLINICAL DECISION MAKING: Evolving/moderate complexity  EVALUATION COMPLEXITY: Moderate   GOALS: Goals reviewed with patient? Yes  SHORT TERM GOALS: Target date: 10/03/23  Patient will be independent with her initial HEP.  Baseline: Goal status: MET  2.  Patient will be able to demonstrate at least 125 degrees of passive left shoulder flexion for improved shoulder mobility.  Baseline: 1/28: 133 degrees (pulleys) Goal  status: MET  3.  Patient will be able to demonstrate at least 110 degrees of passive left shoulder abduction for improved shoulder mobility. Baseline: 1/28: 128 degrees Goal status: MET  LONG TERM GOALS: Target date: 10/31/23  Patient will be independent with her advanced HEP.  Baseline:  Goal status: INITIAL  2.  Patient will be able to demonstrate at least 120 degrees of active shoulder flexion for improved function reaching overhead. Baseline:  Goal status: INITIAL  3.  Patient will be able to demonstrate at least 120 degrees of active left shoulder abduction for improved function reaching overhead. Baseline:  Goal status: INITIAL  4.  Patient will be able to carry at least 8 pounds in her left hand for improved function carrying her groceries.  Baseline:  Goal status: INITIAL  5.  Patient will report being able to wash  her hair without being limited by her familiar left shoulder symptoms.  Baseline:  Goal status: INITIAL  PLAN:  PT FREQUENCY: 2x/week  PT DURATION: 8 weeks  PLANNED INTERVENTIONS: 97164- PT Re-evaluation, 97110-Therapeutic exercises, 97530- Therapeutic activity, 97112- Neuromuscular re-education, 97535- Self Care, [id number]- Manual therapy, 97014- Electrical stimulation (unattended), 97016- Vasopneumatic device, Patient/Family education, Joint mobilization, Cryotherapy, and Moist heat  PLAN FOR NEXT SESSION: see protocol for appropriate exercise progression   Delon DELENA Gosling, PTA 09/19/2023, 10:15 AM

## 2023-09-21 ENCOUNTER — Ambulatory Visit: Payer: 59

## 2023-09-21 DIAGNOSIS — M25512 Pain in left shoulder: Secondary | ICD-10-CM

## 2023-09-21 DIAGNOSIS — M25612 Stiffness of left shoulder, not elsewhere classified: Secondary | ICD-10-CM

## 2023-09-21 NOTE — Therapy (Signed)
 OUTPATIENT PHYSICAL THERAPY SHOULDER TREATMENT   Patient Name: [name] MRN: [id number] DOB:1965/09/21, 58 y.o., female Today's Date: 09/21/2023  END OF SESSION:  PT End of Session - 09/21/23 0848     Visit Number 6    Number of Visits 16    Date for PT Re-Evaluation 11/10/23    PT Start Time 0845    PT Stop Time 0936    PT Time Calculation (min) 51 min    Activity Tolerance Patient tolerated treatment well    Behavior During Therapy WFL for tasks assessed/performed              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   Closed fracture of left distal fibula 04/27/2018   Constipation    SOME BLOOD IN STOOL   GERD (gastroesophageal reflux disease)    occasionally-NO MEDS   History of kidney stones    PONV (postoperative nausea and vomiting)    used a scop patch last surgery-was better   Sleep disorder 07/10/2017   SVT (supraventricular tachycardia) (HCC) 09/2022   Past Surgical History:  Procedure Laterality Date   ABDOMINAL HYSTERECTOMY  08/15/1993   partial   BACK SURGERY  08/15/2013   COLON SURGERY     COLONOSCOPY  06/01/2012   Procedure: COLONOSCOPY;  Surgeon: Margo LITTIE Haddock, MD;  [facility]: [facility];  Service: Endoscopy;  Laterality: N/A;  1:30PM   COLONOSCOPY N/A 06/14/2013   DOQ:fpoi diverticulosis/normal anastomosis   COLONOSCOPY WITH PROPOFOL  N/A 02/18/2020   Procedure: COLONOSCOPY WITH PROPOFOL ;  Surgeon: Shaaron Lamar HERO, MD;  [facility]: [facility];  Service: Endoscopy;  Laterality: N/A;  10:00am   endoscopic left fallopian tube removed  several yrs ago   EUS  06/06/2012   Procedure: LOWER ENDOSCOPIC ULTRASOUND (EUS);  Surgeon: Elsie Cree, MD;  Location: [facility];  Service: Endoscopy;  Laterality: N/A;   EXAMINATION UNDER ANESTHESIA  09/07/2012   Procedure: EXAM UNDER ANESTHESIA;  Surgeon: Jina Nephew, MD;   Location: [facility];  Service: General;  Laterality: N/A;   EXCISION/RELEASE BURSA HIP  09/15/2011   Dr Duwayne EXCISION/RELEASE BURSA HIP;  Surgeon: Reyes JAYSON Duwayne, MD;  Location: [facility];  Service: Orthopedics;  Laterality: Left;  Excision of Trochanteric Bursitis   FLEXIBLE SIGMOIDOSCOPY N/A 03/22/2013   Procedure: FLEXIBLE SIGMOIDOSCOPY;  Surgeon: Margo LITTIE Haddock, MD;  Location: [facility];  Service: Endoscopy;  Laterality: N/A;  2:00   LAPAROSCOPIC LOW ANTERIOR RESECTION  07/02/2012   Procedure: LAPAROSCOPIC LOW ANTERIOR RESECTION;  Surgeon: Jina Nephew, MD;  Location: [facility];  Service: General;  Laterality: N/A;   left ankle surgery for fx  several yrs ago   LUMBAR LAMINECTOMY/DECOMPRESSION MICRODISCECTOMY N/A 05/14/2014   Procedure: LUMBAR DECOMPRESSION L2-L3;  Surgeon: Reyes JAYSON Duwayne, MD;  Location: [facility];  Service: Orthopedics;  Laterality: N/A;   MASS EXCISION Left 02/02/2018   Procedure: EXCISION CYST, LEFT INNER THIGH;  Surgeon: Mavis Anes, MD;  Location: [facility];  Service: General;  Laterality: Left;   PROCTOSCOPY  09/07/2012   Procedure: PROCTOSCOPY;  Surgeon: Jina Nephew, MD;  [facility];  Service: General;  Laterality: N/A;   rotator cuff surg Right    TOTAL SHOULDER ARTHROPLASTY Left 08/28/2023   Procedure: LEFT TOTAL SHOULDER ARTHROPLASTY;  Surgeon: Onesimo Anes LABOR, MD;  Location: [facility];  Service: Orthopedics;  Laterality: Left;  NEEDS RNFA   Patient Active Problem List   Diagnosis Date Noted   Glenohumeral arthritis, left 08/28/2023   BMI 31.0-31.9,adult 02/17/2023   Fever blister 10/05/2020   Chronic low back pain 09/01/2017   Migraine without aura 08/10/2017   Sleep disorder 07/10/2017   GERD (gastroesophageal reflux disease) 05/03/2017   HNP (herniated nucleus pulposus), lumbar 05/14/2014   Internal and external prolapsed hemorrhoids 03/15/2013   Anxiety state 03/15/2013   Rectal cancer, cT2N0, 7 cm from anal verge  06/11/2012   Constipation 05/25/2012   REFERRING PROVIDER: Onesimo Oneil LABOR, MD   REFERRING DIAG: Status post total shoulder arthroplasty, left   THERAPY DIAG:  Acute pain of left shoulder  Stiffness of left shoulder, not elsewhere classified  Rationale for Evaluation and Treatment: Rehabilitation  ONSET DATE: 08/28/23  SUBJECTIVE:                                                                                                                                                                                      SUBJECTIVE STATEMENT: Patient reports 5/10 left shoulder pain today.   Hand dominance: Left  PERTINENT HISTORY: History of cancer, anxiety, chronic low back pain, and anxiety  PAIN:  Are you having pain? Yes: NPRS scale: 5/10 Pain location: left anterior shoulder to the elbow Pain description: aching and intermittent stinging Aggravating factors: none known Relieving factors: ice and medication  PRECAUTIONS: Shoulder  RED FLAGS: None   WEIGHT BEARING RESTRICTIONS: Yes WB < 5 pounds for 0-12 weeks post op  FALLS:  Has patient fallen in last 6 months? No  LIVING ENVIRONMENT: Lives with: lives with their family Lives in: House/apartment Has following equipment at home:  sling  OCCUPATION: Not working currently; need to be able to lift, push pull, reach overhead up to 40-50 pounds  PLOF: Independent  PATIENT GOALS: be able to use her left arm to clean, lift, and other daily activities  NEXT MD VISIT: 09/12/23  OBJECTIVE:  Note: Objective measures were completed at Evaluation unless otherwise noted.  DIAGNOSTIC FINDINGS: 08/28/23 left shoulder x-ray IMPRESSION: Interval left shoulder arthroplasty without evidence of acute complication.  PATIENT SURVEYS:  FOTO 4.31  COGNITION: Overall cognitive status: Within functional limits for tasks assessed     SENSATION: Patient reports no numbness or tingling.   UPPER EXTREMITY ROM:   Passive ROM Right   Eval (AROM)  Left eval  Shoulder flexion 137 85  Shoulder extension    Shoulder abduction 142 60  Shoulder adduction    Shoulder internal rotation To T7   Shoulder external rotation To T3 45  Elbow flexion    Elbow  extension    Wrist flexion    Wrist extension    Wrist ulnar deviation    Wrist radial deviation    Wrist pronation    Wrist supination    (Blank rows = not tested)  UPPER EXTREMITY MMT: not tested due to surgical condition  PALPATION:  TTP: right deltoid                                                                                                                             TREATMENT DATE:    09/21/23:                                  EXERCISE LOG  Exercise Repetitions and Resistance Comments  Pulley 6 mins        Blank cell = exercise not performed today   Manual Therapy Soft Tissue Mobilization: left shoulder, STW/M to left deltoid, bicep, and upper trap to decrease pain and tone    Modalities  Date:  Unattended Estim: Shoulder, IFC 80-150 Hz, 15 mins, Pain and Tone Vaso: Shoulder, 34 degrees; low pressure, 15 mins, Pain and Edema                                    09/14/23 EXERCISE LOG  Exercise Repetitions and Resistance Comments  Iso Flexion  AP and lateral   Iso Abduction    Iso ER    Wall Wash 5 mins   Wall Ladder 5 reps (max # 27)   Scapular retraction     Pulleys 6 mins   Ranger Flex/ext; CW and CCW circles x 2 mins each   PROM Flexion and ER with gentle hold at end range    Blank cell = exercise not performed today   Manual Therapy Soft Tissue Mobilization: left shoulder, STW/M to left deltoid and bicep to decrease pain and tone      PATIENT EDUCATION: Education details: healing, prognosis, and surgical condition Person educated: Patient Education method: Explanation Education comprehension: verbalized understanding  HOME EXERCISE PROGRAM: [id number]  ASSESSMENT:  CLINICAL IMPRESSION: Pt arrives for today's treatment  session reporting 5/10 left shoulder pain.  Pt tearful while performing pulleys and states that tears are from both frustration and pain.  Due to increased pain and discomfort increased time spent on STW.  STW/M performed to left deltoid, bicep, and anterior shoulder musculature to decrease pain and tone.  Normal responses to estim and vaso noted upon removal.  Pt reported 4/10 left shoulder pain at completion of today's treatment session.  OBJECTIVE IMPAIRMENTS: decreased activity tolerance, decreased ROM, decreased strength, hypomobility, increased edema, impaired tone, impaired UE functional use, and pain.   ACTIVITY LIMITATIONS: carrying, lifting, sleeping, bathing, toileting, dressing, reach over head, and hygiene/grooming  PARTICIPATION LIMITATIONS: meal prep, cleaning, laundry, shopping, community activity, occupation, and yard  work  PERSONAL FACTORS: 3+ comorbidities: History of cancer, anxiety, chronic low back pain, and anxiety  are also affecting patient's functional outcome.   REHAB POTENTIAL: Good  CLINICAL DECISION MAKING: Evolving/moderate complexity  EVALUATION COMPLEXITY: Moderate   GOALS: Goals reviewed with patient? Yes  SHORT TERM GOALS: Target date: 10/03/23  Patient will be independent with her initial HEP.  Baseline: Goal status: MET  2.  Patient will be able to demonstrate at least 125 degrees of passive left shoulder flexion for improved shoulder mobility.  Baseline: 1/28: 133 degrees (pulleys) Goal status: MET  3.  Patient will be able to demonstrate at least 110 degrees of passive left shoulder abduction for improved shoulder mobility. Baseline: 1/28: 128 degrees Goal status: MET  LONG TERM GOALS: Target date: 10/31/23  Patient will be independent with her advanced HEP.  Baseline:  Goal status: INITIAL  2.  Patient will be able to demonstrate at least 120 degrees of active shoulder flexion for improved function reaching overhead. Baseline:  Goal  status: INITIAL  3.  Patient will be able to demonstrate at least 120 degrees of active left shoulder abduction for improved function reaching overhead. Baseline:  Goal status: INITIAL  4.  Patient will be able to carry at least 8 pounds in her left hand for improved function carrying her groceries.  Baseline:  Goal status: INITIAL  5.  Patient will report being able to wash her hair without being limited by her familiar left shoulder symptoms.  Baseline:  Goal status: INITIAL  PLAN:  PT FREQUENCY: 2x/week  PT DURATION: 8 weeks  PLANNED INTERVENTIONS: 97164- PT Re-evaluation, 97110-Therapeutic exercises, 97530- Therapeutic activity, 97112- Neuromuscular re-education, 97535- Self Care, 02859- Manual therapy, 97014- Electrical stimulation (unattended), 97016- Vasopneumatic device, Patient/Family education, Joint mobilization, Cryotherapy, and Moist heat  PLAN FOR NEXT SESSION: see protocol for appropriate exercise progression   Delon DELENA Gosling, PTA 09/21/2023, 10:59 AM

## 2023-09-28 ENCOUNTER — Ambulatory Visit: Payer: 59

## 2023-09-28 DIAGNOSIS — M25512 Pain in left shoulder: Secondary | ICD-10-CM | POA: Diagnosis not present

## 2023-09-28 DIAGNOSIS — M25612 Stiffness of left shoulder, not elsewhere classified: Secondary | ICD-10-CM

## 2023-09-28 NOTE — Therapy (Signed)
OUTPATIENT PHYSICAL THERAPY SHOULDER TREATMENT   Patient Name: [name] MRN: [id number] DOB:1966-07-28, 58 y.o., female Today's Date: 09/28/2023  END OF SESSION:  PT End of Session - 09/28/23 0847     Visit Number 7    Number of Visits 16    Date for PT Re-Evaluation 11/10/23    PT Start Time 0845    PT Stop Time 0944    PT Time Calculation (min) 59 min    Activity Tolerance Patient tolerated treatment well    Behavior During Therapy WFL for tasks assessed/performed              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   Closed fracture of left distal fibula 04/27/2018   Constipation    SOME BLOOD IN STOOL   GERD (gastroesophageal reflux disease)    occasionally-NO MEDS   History of kidney stones    PONV (postoperative nausea and vomiting)    used a scop patch last surgery-was better   Sleep disorder 07/10/2017   SVT (supraventricular tachycardia) (HCC) 09/2022   Past Surgical History:  Procedure Laterality Date   ABDOMINAL HYSTERECTOMY  08/15/1993   partial   BACK SURGERY  08/15/2013   COLON SURGERY     COLONOSCOPY  06/01/2012   Procedure: COLONOSCOPY;  Surgeon: West Bali, MD;  [facility]: [facility];  Service: Endoscopy;  Laterality: N/A;  1:30PM   COLONOSCOPY N/A 06/14/2013   KVQ:QVZD diverticulosis/normal anastomosis   COLONOSCOPY WITH PROPOFOL N/A 02/18/2020   Procedure: COLONOSCOPY WITH PROPOFOL;  Surgeon: Corbin Ade, MD;  [facility]: [facility];  Service: Endoscopy;  Laterality: N/A;  10:00am   endoscopic left fallopian tube removed  several yrs ago   EUS  06/06/2012   Procedure: LOWER ENDOSCOPIC ULTRASOUND (EUS);  Surgeon: Willis Modena, MD;  Location: [facility];  Service: Endoscopy;  Laterality: N/A;   EXAMINATION UNDER ANESTHESIA  09/07/2012   Procedure: EXAM UNDER ANESTHESIA;  Surgeon: Almond Lint, MD;   Location: [facility];  Service: General;  Laterality: N/A;   EXCISION/RELEASE BURSA HIP  09/15/2011   Dr Shelle Iron EXCISION/RELEASE BURSA HIP;  Surgeon: Javier Docker, MD;  Location: [facility];  Service: Orthopedics;  Laterality: Left;  Excision of Trochanteric Bursitis   FLEXIBLE SIGMOIDOSCOPY N/A 03/22/2013   Procedure: FLEXIBLE SIGMOIDOSCOPY;  Surgeon: West Bali, MD;  [facility]: [facility];  Service: Endoscopy;  Laterality: N/A;  2:00   LAPAROSCOPIC LOW ANTERIOR RESECTION  07/02/2012   Procedure: LAPAROSCOPIC LOW ANTERIOR RESECTION;  Surgeon: Almond Lint, MD;  Location: [facility];  Service: General;  Laterality: N/A;   left ankle surgery for fx  several yrs ago   LUMBAR LAMINECTOMY/DECOMPRESSION MICRODISCECTOMY N/A 05/14/2014   Procedure: LUMBAR DECOMPRESSION L2-L3;  Surgeon: Javier Docker, MD;  Location: [facility];  Service: Orthopedics;  Laterality: N/A;   MASS EXCISION Left 02/02/2018   Procedure: EXCISION CYST, LEFT INNER THIGH;  Surgeon: Franky Macho, MD;  Location: [facility];  Service: General;  Laterality: Left;   PROCTOSCOPY  09/07/2012   Procedure: PROCTOSCOPY;  Surgeon: Almond Lint, MD;  Location: [facility];  Service: General;  Laterality: N/A;   rotator cuff surg Right    TOTAL SHOULDER ARTHROPLASTY Left 08/28/2023   Procedure: LEFT TOTAL SHOULDER ARTHROPLASTY;  Surgeon: Oliver Barre, MD;  Location: [facility];  Service: Orthopedics;  Laterality: Left;  NEEDS RNFA   Patient Active Problem List   Diagnosis Date Noted   Glenohumeral arthritis, left 08/28/2023   BMI 31.0-31.9,adult 02/17/2023   Fever blister 10/05/2020   Chronic low back pain 09/01/2017   Migraine without aura 08/10/2017   Sleep disorder 07/10/2017   GERD (gastroesophageal reflux disease) 05/03/2017   HNP (herniated nucleus pulposus), lumbar 05/14/2014   Internal and external prolapsed hemorrhoids 03/15/2013   Anxiety state 03/15/2013   Rectal cancer, cT2N0, 7 cm from anal verge  06/11/2012   Constipation 05/25/2012   REFERRING PROVIDER: Oliver Barre, MD   REFERRING DIAG: Status post total shoulder arthroplasty, left   THERAPY DIAG:  Acute pain of left shoulder  Stiffness of left shoulder, not elsewhere classified  Rationale for Evaluation and Treatment: Rehabilitation  ONSET DATE: 08/28/23  SUBJECTIVE:                                                                                                                                                                                      SUBJECTIVE STATEMENT: Patient reports 1-2/10 left shoulder pain today.  Pt states that she has been using her pulleys at home.   Hand dominance: Left  PERTINENT HISTORY: History of cancer, anxiety, chronic low back pain, and anxiety  PAIN:  Are you having pain? Yes: NPRS scale: 1-2/10 Pain location: left anterior shoulder to the elbow Pain description: aching and intermittent stinging Aggravating factors: none known Relieving factors: ice and medication  PRECAUTIONS: Shoulder  RED FLAGS: None   WEIGHT BEARING RESTRICTIONS: Yes WB < 5 pounds for 0-12 weeks post op  FALLS:  Has patient fallen in last 6 months? No  LIVING ENVIRONMENT: Lives with: lives with their family Lives in: House/apartment Has following equipment at home:  sling  OCCUPATION: Not working currently; need to be able to lift, push pull, reach overhead up to 40-50 pounds  PLOF: Independent  PATIENT GOALS: be able to use her left arm to clean, lift, and other daily activities  NEXT MD VISIT: 09/12/23  OBJECTIVE:  Note: Objective measures were completed at Evaluation unless otherwise noted.  DIAGNOSTIC FINDINGS: 08/28/23 left shoulder x-ray IMPRESSION: Interval left shoulder arthroplasty without evidence of acute complication.  PATIENT SURVEYS:  FOTO 4.31  COGNITION: Overall cognitive status: Within functional limits for tasks assessed     SENSATION: Patient reports no numbness or  tingling.   UPPER EXTREMITY ROM:   Passive ROM Right  Eval (AROM)  Left eval  Shoulder flexion 137 85  Shoulder extension    Shoulder abduction 142 60  Shoulder adduction    Shoulder internal rotation To T7   Shoulder  external rotation To T3 45  Elbow flexion    Elbow extension    Wrist flexion    Wrist extension    Wrist ulnar deviation    Wrist radial deviation    Wrist pronation    Wrist supination    (Blank rows = not tested)  UPPER EXTREMITY MMT: not tested due to surgical condition  PALPATION:  TTP: right deltoid                                                                                                                             TREATMENT DATE:   09/28/23   EXERCISE LOG  Exercise Repetitions and Resistance Comments  AA Flexion Cane x 15 reps AP and lateral   AA Chest Press Cane x 15 reps    AA Bicep Curls Cane x 15 reps   Wall Wash 5 mins   Wall Ladder 5 reps (max # 29)   Scapular retraction     Pulleys 6 mins   Ranger Flex/ext; CW and CCW circles x 2 mins each   PROM     Blank cell = exercise not performed today   Manual Therapy Soft Tissue Mobilization: left shoulder, STW/M to left deltoid and bicep to decrease pain and tone   Modalities  Date:  Unattended Estim: Shoulder, IFC 80-150 Hz, 15 mins, Pain and Tone Vaso: Shoulder, 34 degrees; low pressure, 15 mins, Pain and Edema    09/21/23:                                  EXERCISE LOG  Exercise Repetitions and Resistance Comments  Pulley 6 mins        Blank cell = exercise not performed today   Manual Therapy Soft Tissue Mobilization: left shoulder, STW/M to left deltoid, bicep, and upper trap to decrease pain and tone    Modalities  Date:  Unattended Estim: Shoulder, IFC 80-150 Hz, 15 mins, Pain and Tone Vaso: Shoulder, 34 degrees; low pressure, 15 mins, Pain and Edema                                    09/14/23 EXERCISE LOG  Exercise Repetitions and Resistance Comments  Iso Flexion   AP and lateral   Iso Abduction    Iso ER    Wall Wash 5 mins   Wall Ladder 5 reps (max # 27)   Scapular retraction     Pulleys 6 mins   Ranger Flex/ext; CW and CCW circles x 2 mins each   PROM Flexion and ER with gentle hold at end range    Blank cell = exercise not performed today   Manual Therapy Soft Tissue Mobilization: left shoulder, STW/M to left deltoid and bicep to decrease pain and tone      PATIENT EDUCATION:  Education details: healing, prognosis, and surgical condition Person educated: Patient Education method: Explanation Education comprehension: verbalized understanding  HOME EXERCISE PROGRAM: [id number]  ASSESSMENT:  CLINICAL IMPRESSION: Pt arrives for today's treatment session reporting 1-2/10 left shoulder pain.  Pt able to reach number 29 on the wall ladder today, reaching 2 levels higher than previously.  Pt introduced to seated AA exercises with use of cane.  Pt requiring min cues for proper technique and posture with newly added exercises.  STW/M performed to left deltoid and bicep to decrease pain and tone.  Normal responses to estim and vaso noted upon removal.  Pt denied any pain at completion of today's treatment session.   OBJECTIVE IMPAIRMENTS: decreased activity tolerance, decreased ROM, decreased strength, hypomobility, increased edema, impaired tone, impaired UE functional use, and pain.   ACTIVITY LIMITATIONS: carrying, lifting, sleeping, bathing, toileting, dressing, reach over head, and hygiene/grooming  PARTICIPATION LIMITATIONS: meal prep, cleaning, laundry, shopping, community activity, occupation, and yard work  PERSONAL FACTORS: 3+ comorbidities: History of cancer, anxiety, chronic low back pain, and anxiety  are also affecting patient's functional outcome.   REHAB POTENTIAL: Good  CLINICAL DECISION MAKING: Evolving/moderate complexity  EVALUATION COMPLEXITY: Moderate   GOALS: Goals reviewed with patient? Yes  SHORT TERM GOALS: Target  date: 10/03/23  Patient will be independent with her initial HEP.  Baseline: Goal status: MET  2.  Patient will be able to demonstrate at least 125 degrees of passive left shoulder flexion for improved shoulder mobility.  Baseline: 1/28: 133 degrees (pulleys) Goal status: MET  3.  Patient will be able to demonstrate at least 110 degrees of passive left shoulder abduction for improved shoulder mobility. Baseline: 1/28: 128 degrees Goal status: MET  LONG TERM GOALS: Target date: 10/31/23  Patient will be independent with her advanced HEP.  Baseline:  Goal status: INITIAL  2.  Patient will be able to demonstrate at least 120 degrees of active shoulder flexion for improved function reaching overhead. Baseline:  Goal status: INITIAL  3.  Patient will be able to demonstrate at least 120 degrees of active left shoulder abduction for improved function reaching overhead. Baseline:  Goal status: INITIAL  4.  Patient will be able to carry at least 8 pounds in her left hand for improved function carrying her groceries.  Baseline:  Goal status: INITIAL  5.  Patient will report being able to wash her hair without being limited by her familiar left shoulder symptoms.  Baseline:  Goal status: INITIAL  PLAN:  PT FREQUENCY: 2x/week  PT DURATION: 8 weeks  PLANNED INTERVENTIONS: 97164- PT Re-evaluation, 97110-Therapeutic exercises, 97530- Therapeutic activity, 97112- Neuromuscular re-education, 97535- Self Care, 29562- Manual therapy, 97014- Electrical stimulation (unattended), 97016- Vasopneumatic device, Patient/Family education, Joint mobilization, Cryotherapy, and Moist heat  PLAN FOR NEXT SESSION: see protocol for appropriate exercise progression   Newman Pies, PTA 09/28/2023, 9:48 AM

## 2023-10-03 ENCOUNTER — Ambulatory Visit: Payer: 59

## 2023-10-03 DIAGNOSIS — M25512 Pain in left shoulder: Secondary | ICD-10-CM

## 2023-10-03 DIAGNOSIS — M25612 Stiffness of left shoulder, not elsewhere classified: Secondary | ICD-10-CM

## 2023-10-03 NOTE — Therapy (Signed)
OUTPATIENT PHYSICAL THERAPY SHOULDER TREATMENT   Patient Name: [name] MRN: [id number] DOB:October 22, 1965, 58 y.o., female Today's Date: 10/03/2023  END OF SESSION:  PT End of Session - 10/03/23 0847     Visit Number 8    Number of Visits 16    Date for PT Re-Evaluation 11/10/23    PT Start Time 0845    PT Stop Time 0930    PT Time Calculation (min) 45 min    Activity Tolerance Patient tolerated treatment well    Behavior During Therapy WFL for tasks assessed/performed              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   Closed fracture of left distal fibula 04/27/2018   Constipation    SOME BLOOD IN STOOL   GERD (gastroesophageal reflux disease)    occasionally-NO MEDS   History of kidney stones    PONV (postoperative nausea and vomiting)    used a scop patch last surgery-was better   Sleep disorder 07/10/2017   SVT (supraventricular tachycardia) (HCC) 09/2022   Past Surgical History:  Procedure Laterality Date   ABDOMINAL HYSTERECTOMY  08/15/1993   partial   BACK SURGERY  08/15/2013   COLON SURGERY     COLONOSCOPY  06/01/2012   Procedure: COLONOSCOPY;  Surgeon: West Bali, MD;  [facility]: [facility];  Service: Endoscopy;  Laterality: N/A;  1:30PM   COLONOSCOPY N/A 06/14/2013   WGN:[id number] diverticulosis/normal anastomosis   COLONOSCOPY WITH PROPOFOL N/A 02/18/2020   Procedure: COLONOSCOPY WITH PROPOFOL;  Surgeon: Corbin Ade, MD;  [facility]: [facility];  Service: Endoscopy;  Laterality: N/A;  10:00am   endoscopic left fallopian tube removed  several yrs ago   EUS  06/06/2012   Procedure: LOWER ENDOSCOPIC ULTRASOUND (EUS);  Surgeon: Willis Modena, MD;  Location: [facility];  Service: Endoscopy;  Laterality: N/A;   EXAMINATION UNDER ANESTHESIA  09/07/2012   Procedure: EXAM UNDER ANESTHESIA;  Surgeon: Almond Lint, MD;   Location: [facility];  Service: General;  Laterality: N/A;   EXCISION/RELEASE BURSA HIP  09/15/2011   Dr Shelle Iron EXCISION/RELEASE BURSA HIP;  Surgeon: Javier Docker, MD;  Location: [facility];  Service: Orthopedics;  Laterality: Left;  Excision of Trochanteric Bursitis   FLEXIBLE SIGMOIDOSCOPY N/A 03/22/2013   Procedure: FLEXIBLE SIGMOIDOSCOPY;  Surgeon: West Bali, MD;  [facility];  Service: Endoscopy;  Laterality: N/A;  2:00   LAPAROSCOPIC LOW ANTERIOR RESECTION  07/02/2012   Procedure: LAPAROSCOPIC LOW ANTERIOR RESECTION;  Surgeon: Almond Lint, MD;  Location: [facility];  Service: General;  Laterality: N/A;   left ankle surgery for fx  several yrs ago   LUMBAR LAMINECTOMY/DECOMPRESSION MICRODISCECTOMY N/A 05/14/2014   Procedure: LUMBAR DECOMPRESSION L2-L3;  Surgeon: Javier Docker, MD;  Location: [facility];  Service: Orthopedics;  Laterality: N/A;   MASS EXCISION Left 02/02/2018   Procedure: EXCISION CYST, LEFT INNER THIGH;  Surgeon: Franky Macho, MD;  Location: [facility];  Service: General;  Laterality: Left;   PROCTOSCOPY  09/07/2012   Procedure: PROCTOSCOPY;  Surgeon: Almond Lint, MD;  Location: [facility];  Service: General;  Laterality: N/A;   rotator cuff surg Right    TOTAL SHOULDER ARTHROPLASTY Left 08/28/2023   Procedure: LEFT TOTAL SHOULDER ARTHROPLASTY;  Surgeon: Oliver Barre, MD;  Location: [facility];  Service: Orthopedics;  Laterality: Left;  NEEDS RNFA   Patient Active Problem List   Diagnosis Date Noted   Glenohumeral arthritis, left 08/28/2023   BMI 31.0-31.9,adult 02/17/2023   Fever blister 10/05/2020   Chronic low back pain 09/01/2017   Migraine without aura 08/10/2017   Sleep disorder 07/10/2017   GERD (gastroesophageal reflux disease) 05/03/2017   HNP (herniated nucleus pulposus), lumbar 05/14/2014   Internal and external prolapsed hemorrhoids 03/15/2013   Anxiety state 03/15/2013   Rectal cancer, cT2N0, 7 cm from anal verge  06/11/2012   Constipation 05/25/2012   REFERRING PROVIDER: Oliver Barre, MD   REFERRING DIAG: Status post total shoulder arthroplasty, left   THERAPY DIAG:  Acute pain of left shoulder  Stiffness of left shoulder, not elsewhere classified  Rationale for Evaluation and Treatment: Rehabilitation  ONSET DATE: 08/28/23  SUBJECTIVE:                                                                                                                                                                                      SUBJECTIVE STATEMENT: Pt reports 3/10 left shoulder pain today.    Hand dominance: Left  PERTINENT HISTORY: History of cancer, anxiety, chronic low back pain, and anxiety  PAIN:  Are you having pain? Yes: NPRS scale: 3/10 Pain location: left anterior shoulder to the elbow Pain description: aching and intermittent stinging Aggravating factors: none known Relieving factors: ice and medication  PRECAUTIONS: Shoulder  RED FLAGS: None   WEIGHT BEARING RESTRICTIONS: Yes WB < 5 pounds for 0-12 weeks post op  FALLS:  Has patient fallen in last 6 months? No  LIVING ENVIRONMENT: Lives with: lives with their family Lives in: House/apartment Has following equipment at home:  sling  OCCUPATION: Not working currently; need to be able to lift, push pull, reach overhead up to 40-50 pounds  PLOF: Independent  PATIENT GOALS: be able to use her left arm to clean, lift, and other daily activities  NEXT MD VISIT: 09/12/23  OBJECTIVE:  Note: Objective measures were completed at Evaluation unless otherwise noted.  DIAGNOSTIC FINDINGS: 08/28/23 left shoulder x-ray IMPRESSION: Interval left shoulder arthroplasty without evidence of acute complication.  PATIENT SURVEYS:  FOTO 4.31  COGNITION: Overall cognitive status: Within functional limits for tasks assessed     SENSATION: Patient reports no numbness or tingling.   UPPER EXTREMITY ROM:   Passive ROM Right  Eval  (AROM)  Left eval  Shoulder flexion 137 85  Shoulder extension    Shoulder abduction 142 60  Shoulder adduction    Shoulder internal rotation To T7   Shoulder external rotation To T3 45  Elbow flexion  Elbow extension    Wrist flexion    Wrist extension    Wrist ulnar deviation    Wrist radial deviation    Wrist pronation    Wrist supination    (Blank rows = not tested)  UPPER EXTREMITY MMT: not tested due to surgical condition  PALPATION:  TTP: right deltoid                                                                                                                             TREATMENT DATE:   10/03/23   EXERCISE LOG  Exercise Repetitions and Resistance Comments  AA Flexion Cane x 20 reps AP and lateral   AA Chest Press Cane x 20 reps    AA Bicep Curls Cane x 20 reps   Wall Wash 5 mins   Wall Ladder    [employer]s 3 mins   [employer] 3 mins   Bicep Curls 3-way 1# x 20 reps each   Pulleys 6 mins   Ranger Flex/ext; CW and CCW circles x 2 mins each   PROM     Blank cell = exercise not performed today   Manual Therapy Soft Tissue Mobilization: left shoulder, STW/M to left deltoid and bicep to decrease pain and tone    09/21/23:                                  EXERCISE LOG  Exercise Repetitions and Resistance Comments  Pulley 6 mins        Blank cell = exercise not performed today   Manual Therapy Soft Tissue Mobilization: left shoulder, STW/M to left deltoid, bicep, and upper trap to decrease pain and tone    Modalities  Date:  Unattended Estim: Shoulder, IFC 80-150 Hz, 15 mins, Pain and Tone Vaso: Shoulder, 34 degrees; low pressure, 15 mins, Pain and Edema                                    09/14/23 EXERCISE LOG  Exercise Repetitions and Resistance Comments  Iso Flexion  AP and lateral   Iso Abduction    Iso ER    Wall Wash 5 mins   Wall Ladder 5 reps (max # 27)   Scapular retraction     Pulleys 6 mins   Ranger Flex/ext; CW and CCW circles x  2 mins each   PROM Flexion and ER with gentle hold at end range    Blank cell = exercise not performed today   Manual Therapy Soft Tissue Mobilization: left shoulder, STW/M to left deltoid and bicep to decrease pain and tone      PATIENT EDUCATION: Education details: healing, prognosis, and surgical condition Person educated: Patient Education method: Explanation Education comprehension: verbalized understanding  HOME EXERCISE PROGRAM: [id number]  ASSESSMENT:  CLINICAL  IMPRESSION: Pt arrives for today's treatment session reporting 3/10 left shoulder pain.  Pt states that she see her MD next Tuesday 2/25.  Pt able to tolerate increased reps with previously performed exercises.  Pt introduced to standing physioball exercises today with min cues to avoid giving max effort with each rep.  Pt also introduced to 3-way bicep curls with good results.  STW/M performed to left bicep and anterior deltoid to decrease pain and tone.  Pt opted to perform ice at home today.  Pt reported decreased pain at completion of today's treatment session.  OBJECTIVE IMPAIRMENTS: decreased activity tolerance, decreased ROM, decreased strength, hypomobility, increased edema, impaired tone, impaired UE functional use, and pain.   ACTIVITY LIMITATIONS: carrying, lifting, sleeping, bathing, toileting, dressing, reach over head, and hygiene/grooming  PARTICIPATION LIMITATIONS: meal prep, cleaning, laundry, shopping, community activity, occupation, and yard work  PERSONAL FACTORS: 3+ comorbidities: History of cancer, anxiety, chronic low back pain, and anxiety  are also affecting patient's functional outcome.   REHAB POTENTIAL: Good  CLINICAL DECISION MAKING: Evolving/moderate complexity  EVALUATION COMPLEXITY: Moderate   GOALS: Goals reviewed with patient? Yes  SHORT TERM GOALS: Target date: 10/03/23  Patient will be independent with her initial HEP.  Baseline: Goal status: MET  2.  Patient will be able  to demonstrate at least 125 degrees of passive left shoulder flexion for improved shoulder mobility.  Baseline: 1/28: 133 degrees (pulleys) Goal status: MET  3.  Patient will be able to demonstrate at least 110 degrees of passive left shoulder abduction for improved shoulder mobility. Baseline: 1/28: 128 degrees Goal status: MET  LONG TERM GOALS: Target date: 10/31/23  Patient will be independent with her advanced HEP.  Baseline:  Goal status: IN PROGRESS  2.  Patient will be able to demonstrate at least 120 degrees of active shoulder flexion for improved function reaching overhead. Baseline:  Goal status: IN PROGRESS  3.  Patient will be able to demonstrate at least 120 degrees of active left shoulder abduction for improved function reaching overhead. Baseline:  Goal status: IN PROGRESS  4.  Patient will be able to carry at least 8 pounds in her left hand for improved function carrying her groceries.  Baseline:  Goal status: IN PROGRESS  5.  Patient will report being able to wash her hair without being limited by her familiar left shoulder symptoms.  Baseline:  Goal status: IN PROGRESS  PLAN:  PT FREQUENCY: 2x/week  PT DURATION: 8 weeks  PLANNED INTERVENTIONS: 97164- PT Re-evaluation, 97110-Therapeutic exercises, 97530- Therapeutic activity, 97112- Neuromuscular re-education, 97535- Self Care, 29562- Manual therapy, 97014- Electrical stimulation (unattended), 97016- Vasopneumatic device, Patient/Family education, Joint mobilization, Cryotherapy, and Moist heat  PLAN FOR NEXT SESSION: see protocol for appropriate exercise progression   Newman Pies, PTA 10/03/2023, 10:29 AM

## 2023-10-06 ENCOUNTER — Other Ambulatory Visit: Payer: Self-pay | Admitting: Nurse Practitioner

## 2023-10-06 DIAGNOSIS — F411 Generalized anxiety disorder: Secondary | ICD-10-CM

## 2023-10-06 MED ORDER — ESCITALOPRAM OXALATE 10 MG PO TABS: 10.0000 mg | ORAL_TABLET | Freq: Every day | ORAL | 0 refills | Status: DC

## 2023-10-06 NOTE — Telephone Encounter (Signed)
MMM pt NTBS 30-d given 09/11/23

## 2023-10-06 NOTE — Telephone Encounter (Signed)
Appt scheduled for 10/19/2023, pt will run out please send refill to pharm

## 2023-10-06 NOTE — Addendum Note (Signed)
Addended by: Julious Payer D on: 10/06/2023 04:18 PM   Modules accepted: Orders

## 2023-10-10 ENCOUNTER — Ambulatory Visit: Payer: 59

## 2023-10-10 ENCOUNTER — Other Ambulatory Visit (INDEPENDENT_AMBULATORY_CARE_PROVIDER_SITE_OTHER): Payer: Self-pay

## 2023-10-10 ENCOUNTER — Ambulatory Visit (INDEPENDENT_AMBULATORY_CARE_PROVIDER_SITE_OTHER): Payer: 59 | Admitting: Orthopedic Surgery

## 2023-10-10 ENCOUNTER — Encounter: Payer: Self-pay | Admitting: Orthopedic Surgery

## 2023-10-10 DIAGNOSIS — Z96612 Presence of left artificial shoulder joint: Secondary | ICD-10-CM

## 2023-10-10 DIAGNOSIS — M25612 Stiffness of left shoulder, not elsewhere classified: Secondary | ICD-10-CM

## 2023-10-10 DIAGNOSIS — M25512 Pain in left shoulder: Secondary | ICD-10-CM

## 2023-10-10 NOTE — Progress Notes (Signed)
 Orthopaedic Postop Note  Assessment: [name] is a 58 y.o. female s/p Left Anatomic Total Shoulder Arthroplasty  DOS: 08/28/2023  Plan: [name] is recovering well following surgery.  Pain is improved.  She is working well with therapy.  Provided reassurance.  Reminded her that this is a full 73-month recovery.  Anticipate she will be a lot better at the next visit.  She does not need to use her sling.  If she feels comfortable, she can start driving again.  I would like to see her back in 6 weeks.    Follow-up: Return in about 6 weeks (around 11/21/2023).  XR at next visit: Left shoulder  Subjective:  Chief Complaint  Patient presents with   Routine Post Op    L TSA DOS 08/28/23    History of Present Illness: [name] is a 58 y.o. female who presents following the above stated procedure.  Surgery was approximately 6 weeks ago.  She is working with therapy.  Things are going well.  She continues to use the sling occasionally.  She is still sleeping in a recliner.  No numbness or tingling.  She has some sensitivity around her surgical incision.   Review of Systems: No fevers or chills No numbness or tingling No Chest Pain No shortness of breath   Objective: There were no vitals taken for this visit.  Physical Exam:  Alert and oriented, no acute distress  Surgical incision is healing.  No surrounding erythema or drainage.  She does have some irritation, especially over the superior aspect of the incision.  Passive forward flexion of 130 degrees passive abduction to 100 degrees.  Active forward flexion to 90 degrees.  Active abduction to 80 degrees.  She tolerates gentle external rotation at her side.  Fingers are warm and well-perfused.   IMAGING: I personally ordered and reviewed the following images:  X-rays left shoulder were obtained in clinic today.  No acute injuries.  Prostheses remain in stable position.  Glenohumeral joint is reduced.   No prior periprosthetic fracture.  No periprosthetic lucency.  Marked for the glenoid remains in unchanged position.  No change in alignment overall.  Impression: Stable left shoulder arthroplasty in unchanged alignment.  Oliver Barre, MD 10/10/2023 11:07 AM

## 2023-10-10 NOTE — Therapy (Addendum)
 OUTPATIENT PHYSICAL THERAPY SHOULDER TREATMENT   Patient Name: [name] MRN: [id number] DOB:July 24, 1966, 58 y.o., female Today's Date: 10/10/2023  END OF SESSION:  PT End of Session - 10/10/23 0802     Visit Number 9    Number of Visits 16    Date for PT Re-Evaluation 11/10/23    PT Start Time 0800    PT Stop Time 0901    PT Time Calculation (min) 61 min    Activity Tolerance Patient tolerated treatment well    Behavior During Therapy WFL for tasks assessed/performed              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   Closed fracture of left distal fibula 04/27/2018   Constipation    SOME BLOOD IN STOOL   GERD (gastroesophageal reflux disease)    occasionally-NO MEDS   History of kidney stones    PONV (postoperative nausea and vomiting)    used a scop patch last surgery-was better   Sleep disorder 07/10/2017   SVT (supraventricular tachycardia) (HCC) 09/2022   Past Surgical History:  Procedure Laterality Date   ABDOMINAL HYSTERECTOMY  08/15/1993   partial   BACK SURGERY  08/15/2013   COLON SURGERY     COLONOSCOPY  06/01/2012   Procedure: COLONOSCOPY;  Surgeon: West Bali, MD;  Location: [facility];  Service: Endoscopy;  Laterality: N/A;  1:30PM   COLONOSCOPY N/A 06/14/2013   WUJ:WJXB diverticulosis/normal anastomosis   COLONOSCOPY WITH PROPOFOL N/A 02/18/2020   Procedure: COLONOSCOPY WITH PROPOFOL;  Surgeon: Corbin Ade, MD;  Location: [facility];  Service: Endoscopy;  Laterality: N/A;  10:00am   endoscopic left fallopian tube removed  several yrs ago   EUS  06/06/2012   Procedure: LOWER ENDOSCOPIC ULTRASOUND (EUS);  Surgeon: Willis Modena, MD;  Location: [facility];  Service: Endoscopy;  Laterality: N/A;   EXAMINATION UNDER ANESTHESIA  09/07/2012   Procedure: EXAM UNDER ANESTHESIA;  Surgeon: Almond Lint, MD;   Location: [facility];  Service: General;  Laterality: N/A;   EXCISION/RELEASE BURSA HIP  09/15/2011   Dr Shelle Iron EXCISION/RELEASE BURSA HIP;  Surgeon: Javier Docker, MD;  Location: [facility];  Service: Orthopedics;  Laterality: Left;  Excision of Trochanteric Bursitis   FLEXIBLE SIGMOIDOSCOPY N/A 03/22/2013   Procedure: FLEXIBLE SIGMOIDOSCOPY;  Surgeon: West Bali, MD;  Location: [facility];  Service: Endoscopy;  Laterality: N/A;  2:00   LAPAROSCOPIC LOW ANTERIOR RESECTION  07/02/2012   Procedure: LAPAROSCOPIC LOW ANTERIOR RESECTION;  Surgeon: Almond Lint, MD;  Location: [facility];  Service: General;  Laterality: N/A;   left ankle surgery for fx  several yrs ago   LUMBAR LAMINECTOMY/DECOMPRESSION MICRODISCECTOMY N/A 05/14/2014   Procedure: LUMBAR DECOMPRESSION L2-L3;  Surgeon: Javier Docker, MD;  Location: [facility];  Service: Orthopedics;  Laterality: N/A;   MASS EXCISION Left 02/02/2018   Procedure: EXCISION CYST, LEFT INNER THIGH;  Surgeon: Franky Macho, MD;  Location: [facility];  Service: General;  Laterality: Left;   PROCTOSCOPY  09/07/2012   Procedure: PROCTOSCOPY;  Surgeon: Almond Lint, MD;  Location: [facility];  Service: General;  Laterality: N/A;   rotator cuff surg Right    TOTAL SHOULDER ARTHROPLASTY Left 08/28/2023   Procedure: LEFT TOTAL SHOULDER ARTHROPLASTY;  Surgeon: Oliver Barre, MD;  Location: [facility];  Service: Orthopedics;  Laterality: Left;  NEEDS RNFA   Patient Active Problem List   Diagnosis Date Noted   Glenohumeral arthritis, left 08/28/2023   BMI 31.0-31.9,adult 02/17/2023   Fever blister 10/05/2020   Chronic low back pain 09/01/2017   Migraine without aura 08/10/2017   Sleep disorder 07/10/2017   GERD (gastroesophageal reflux disease) 05/03/2017   HNP (herniated nucleus pulposus), lumbar 05/14/2014   Internal and external prolapsed hemorrhoids 03/15/2013   Anxiety state 03/15/2013   Rectal cancer, cT2N0, 7 cm from anal verge  06/11/2012   Constipation 05/25/2012   REFERRING PROVIDER: Oliver Barre, MD   REFERRING DIAG: Status post total shoulder arthroplasty, left   THERAPY DIAG:  Acute pain of left shoulder  Stiffness of left shoulder, not elsewhere classified  Rationale for Evaluation and Treatment: Rehabilitation  ONSET DATE: 08/28/23  SUBJECTIVE:                                                                                                                                                                                      SUBJECTIVE STATEMENT: Pt reports 2-3/10 left shoulder pain today.    Hand dominance: Left  PERTINENT HISTORY: History of cancer, anxiety, chronic low back pain, and anxiety  PAIN:  Are you having pain? Yes: NPRS scale: 2-3/10 Pain location: left anterior shoulder to the elbow Pain description: aching and intermittent stinging Aggravating factors: none known Relieving factors: ice and medication  PRECAUTIONS: Shoulder  RED FLAGS: None   WEIGHT BEARING RESTRICTIONS: Yes WB < 5 pounds for 0-12 weeks post op  FALLS:  Has patient fallen in last 6 months? No  LIVING ENVIRONMENT: Lives with: lives with their family Lives in: House/apartment Has following equipment at home:  sling  OCCUPATION: Not working currently; need to be able to lift, push pull, reach overhead up to 40-50 pounds  PLOF: Independent  PATIENT GOALS: be able to use her left arm to clean, lift, and other daily activities  NEXT MD VISIT: 09/12/23  OBJECTIVE:  Note: Objective measures were completed at Evaluation unless otherwise noted.  DIAGNOSTIC FINDINGS: 08/28/23 left shoulder x-ray IMPRESSION: Interval left shoulder arthroplasty without evidence of acute complication.  PATIENT SURVEYS:  FOTO 4.31  COGNITION: Overall cognitive status: Within functional limits for tasks assessed     SENSATION: Patient reports no numbness or tingling.   UPPER EXTREMITY ROM:   Passive ROM Right   Eval (AROM)  Left eval  Shoulder flexion 137 85  Shoulder extension    Shoulder abduction 142 60  Shoulder adduction    Shoulder internal rotation To T7   Shoulder external rotation To T3 45  Elbow flexion  Elbow extension    Wrist flexion    Wrist extension    Wrist ulnar deviation    Wrist radial deviation    Wrist pronation    Wrist supination    (Blank rows = not tested)  UPPER EXTREMITY MMT: not tested due to surgical condition  PALPATION:  TTP: right deltoid                                                                                                                             TREATMENT DATE:   10/10/23   EXERCISE LOG  Exercise Repetitions and Resistance Comments  AA Flexion Cane x 20 reps AP and lateral   AA Chest Press Cane x 20 reps    AA Bicep Curls Cane x 20 reps   Wall Wash 5 mins   Wall Ladder    Ball Roll Outs 3.5 mins   [employer] 3.5 mins   Bicep Curls 3-way 1# x 25 reps each   Pulleys 6 mins   Ranger Flex/ext; CW and CCW circles x 2.5 mins each   PROM     Blank cell = exercise not performed today   Manual Therapy Soft Tissue Mobilization: left shoulder, STW/M to left deltoid and bicep to decrease pain and tone    09/21/23:                                  EXERCISE LOG  Exercise Repetitions and Resistance Comments  [place] 6 mins        Blank cell = exercise not performed today   Manual Therapy Soft Tissue Mobilization: left shoulder, STW/M to left deltoid, bicep, and upper trap to decrease pain and tone    Modalities  Date:  Unattended Estim: Shoulder, IFC 80-150 Hz, 15 mins, Pain and Tone Vaso: Shoulder, 34 degrees; low pressure, 15 mins, Pain and Edema                                    09/14/23 EXERCISE LOG  Exercise Repetitions and Resistance Comments  Iso Flexion  AP and lateral   Iso Abduction    Iso ER    Wall Wash 5 mins   Wall Ladder 5 reps (max # 27)   Scapular retraction     Pulleys 6 mins   Ranger Flex/ext; CW and  CCW circles x 2 mins each   PROM Flexion and ER with gentle hold at end range    Blank cell = exercise not performed today   Manual Therapy Soft Tissue Mobilization: left shoulder, STW/M to left deltoid and bicep to decrease pain and tone      PATIENT EDUCATION: Education details: healing, prognosis, and surgical condition Person educated: Patient Education method: Explanation Education comprehension: verbalized understanding  HOME EXERCISE PROGRAM: [id number]  ASSESSMENT:  CLINICAL  IMPRESSION: Pt arrives for today's treatment session reporting 2-3/10 left shoulder pain.  Pt able to demonstrate 92 degrees of active left shoulder flexion and 80 degrees of active left shoulder abduction.  Pt is making progress towards all of her goals according to protocol.  STW/M performed to left anterior deltoid and bicep to decrease pain and tone.  Normal responses to estim and vaso noted upon removal.  Pt reported decreased pain at completion of today's treatment session.  10/10/23 PROGRESS REPORT:   Patient is making good progress with skilled physical therapy as appropriate for her surgical condition. She was able to meet all of her short term goals for physical therapy. However, she has not yet met her long term goals for physical therapy at this time. Recommend that she continue with her current plan of care to address her remaining impairments to return to her prior level of function.   Candi Leash, PT, DPT   OBJECTIVE IMPAIRMENTS: decreased activity tolerance, decreased ROM, decreased strength, hypomobility, increased edema, impaired tone, impaired UE functional use, and pain.   ACTIVITY LIMITATIONS: carrying, lifting, sleeping, bathing, toileting, dressing, reach over head, and hygiene/grooming  PARTICIPATION LIMITATIONS: meal prep, cleaning, laundry, shopping, community activity, occupation, and yard work  PERSONAL FACTORS: 3+ comorbidities: History of cancer, anxiety, chronic low back  pain, and anxiety  are also affecting patient's functional outcome.   REHAB POTENTIAL: Good  CLINICAL DECISION MAKING: Evolving/moderate complexity  EVALUATION COMPLEXITY: Moderate   GOALS: Goals reviewed with patient? Yes  SHORT TERM GOALS: Target date: 10/03/23  Patient will be independent with her initial HEP.  Baseline: Goal status: MET  2.  Patient will be able to demonstrate at least 125 degrees of passive left shoulder flexion for improved shoulder mobility.  Baseline: 1/28: 133 degrees (pulleys) Goal status: MET  3.  Patient will be able to demonstrate at least 110 degrees of passive left shoulder abduction for improved shoulder mobility. Baseline: 1/28: 128 degrees Goal status: MET  LONG TERM GOALS: Target date: 10/31/23  Patient will be independent with her advanced HEP.  Baseline:  Goal status: IN PROGRESS  2.  Patient will be able to demonstrate at least 120 degrees of active shoulder flexion for improved function reaching overhead. Baseline: 2/25: 92 degrees Goal status: IN PROGRESS  3.  Patient will be able to demonstrate at least 120 degrees of active left shoulder abduction for improved function reaching overhead. Baseline: 2/25: 80 degrees Goal status: IN PROGRESS  4.  Patient will be able to carry at least 8 pounds in her left hand for improved function carrying her groceries.  Baseline: 2/25: unable d/t protocol Goal status: IN PROGRESS  5.  Patient will report being able to wash her hair without being limited by her familiar left shoulder symptoms.  Baseline:  Goal status: IN PROGRESS  PLAN:  PT FREQUENCY: 2x/week  PT DURATION: 8 weeks  PLANNED INTERVENTIONS: 97164- PT Re-evaluation, 97110-Therapeutic exercises, 97530- Therapeutic activity, 97112- Neuromuscular re-education, 97535- Self Care, [id number]- Manual therapy, 97014- Electrical stimulation (unattended), 97016- Vasopneumatic device, Patient/Family education, Joint mobilization, Cryotherapy,  and Moist heat  PLAN FOR NEXT SESSION: see protocol for appropriate exercise progression   Newman Pies, PTA 10/10/2023, 9:44 AM

## 2023-10-12 ENCOUNTER — Ambulatory Visit: Payer: 59

## 2023-10-12 DIAGNOSIS — M25512 Pain in left shoulder: Secondary | ICD-10-CM

## 2023-10-12 DIAGNOSIS — M25612 Stiffness of left shoulder, not elsewhere classified: Secondary | ICD-10-CM

## 2023-10-12 NOTE — Therapy (Signed)
 OUTPATIENT PHYSICAL THERAPY SHOULDER TREATMENT   Patient Name: [name] MRN: [id number] DOB:May 05, 1966, 58 y.o., female Today's Date: 10/12/2023  END OF SESSION:  PT End of Session - 10/12/23 0850     Visit Number 10    Number of Visits 16    Date for PT Re-Evaluation 11/10/23    PT Start Time 0845    PT Stop Time 0948    PT Time Calculation (min) 63 min    Activity Tolerance Patient tolerated treatment well    Behavior During Therapy WFL for tasks assessed/performed              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   Closed fracture of left distal fibula 04/27/2018   Constipation    SOME BLOOD IN STOOL   GERD (gastroesophageal reflux disease)    occasionally-NO MEDS   History of kidney stones    PONV (postoperative nausea and vomiting)    used a scop patch last surgery-was better   Sleep disorder 07/10/2017   SVT (supraventricular tachycardia) (HCC) 09/2022   Past Surgical History:  Procedure Laterality Date   ABDOMINAL HYSTERECTOMY  08/15/1993   partial   BACK SURGERY  08/15/2013   COLON SURGERY     COLONOSCOPY  06/01/2012   Procedure: COLONOSCOPY;  Surgeon: West Bali, MD;  Location: [facility];  Service: Endoscopy;  Laterality: N/A;  1:30PM   COLONOSCOPY N/A 06/14/2013   JOA:CZYS diverticulosis/normal anastomosis   COLONOSCOPY WITH PROPOFOL N/A 02/18/2020   Procedure: COLONOSCOPY WITH PROPOFOL;  Surgeon: Corbin Ade, MD;  Location: [facility];  Service: Endoscopy;  Laterality: N/A;  10:00am   endoscopic left fallopian tube removed  several yrs ago   EUS  06/06/2012   Procedure: LOWER ENDOSCOPIC ULTRASOUND (EUS);  Surgeon: Willis Modena, MD;  Location: [facility];  Service: Endoscopy;  Laterality: N/A;   EXAMINATION UNDER ANESTHESIA  09/07/2012   Procedure: EXAM UNDER ANESTHESIA;  Surgeon: Almond Lint, MD;   Location: [facility];  Service: General;  Laterality: N/A;   EXCISION/RELEASE BURSA HIP  09/15/2011   Dr Shelle Iron EXCISION/RELEASE BURSA HIP;  Surgeon: Javier Docker, MD;  Location: [facility];  Service: Orthopedics;  Laterality: Left;  Excision of Trochanteric Bursitis   FLEXIBLE SIGMOIDOSCOPY N/A 03/22/2013   Procedure: FLEXIBLE SIGMOIDOSCOPY;  Surgeon: West Bali, MD;  Location: [facility];  Service: Endoscopy;  Laterality: N/A;  2:00   LAPAROSCOPIC LOW ANTERIOR RESECTION  07/02/2012   Procedure: LAPAROSCOPIC LOW ANTERIOR RESECTION;  Surgeon: Almond Lint, MD;  Location: [facility];  Service: General;  Laterality: N/A;   left ankle surgery for fx  several yrs ago   LUMBAR LAMINECTOMY/DECOMPRESSION MICRODISCECTOMY N/A 05/14/2014   Procedure: LUMBAR DECOMPRESSION L2-L3;  Surgeon: Javier Docker, MD;  Location: [facility];  Service: Orthopedics;  Laterality: N/A;   MASS EXCISION Left 02/02/2018   Procedure: EXCISION CYST, LEFT INNER THIGH;  Surgeon: Franky Macho, MD;  Location: [facility];  Service: General;  Laterality: Left;   PROCTOSCOPY  09/07/2012   Procedure: PROCTOSCOPY;  Surgeon: Almond Lint, MD;  Location: [facility];  Service: General;  Laterality: N/A;   rotator cuff surg Right    TOTAL SHOULDER ARTHROPLASTY Left 08/28/2023   Procedure: LEFT TOTAL SHOULDER ARTHROPLASTY;  Surgeon: Oliver Barre, MD;  Location: [facility];  Service: Orthopedics;  Laterality: Left;  NEEDS RNFA   Patient Active Problem List   Diagnosis Date Noted   Glenohumeral arthritis, left 08/28/2023   BMI 31.0-31.9,adult 02/17/2023   Fever blister 10/05/2020   Chronic low back pain 09/01/2017   Migraine without aura 08/10/2017   Sleep disorder 07/10/2017   GERD (gastroesophageal reflux disease) 05/03/2017   HNP (herniated nucleus pulposus), lumbar 05/14/2014   Internal and external prolapsed hemorrhoids 03/15/2013   Anxiety state 03/15/2013   Rectal cancer, cT2N0, 7 cm from anal verge  06/11/2012   Constipation 05/25/2012   REFERRING PROVIDER: Oliver Barre, MD   REFERRING DIAG: Status post total shoulder arthroplasty, left   THERAPY DIAG:  Acute pain of left shoulder  Stiffness of left shoulder, not elsewhere classified  Rationale for Evaluation and Treatment: Rehabilitation  ONSET DATE: 08/28/23  SUBJECTIVE:                                                                                                                                                                                      SUBJECTIVE STATEMENT: Pt reports 2/10 left shoulder pain today.    Hand dominance: Left  PERTINENT HISTORY: History of cancer, anxiety, chronic low back pain, and anxiety  PAIN:  Are you having pain? Yes: NPRS scale: 2/10 Pain location: left anterior shoulder to the elbow Pain description: aching and intermittent stinging Aggravating factors: none known Relieving factors: ice and medication  PRECAUTIONS: Shoulder  RED FLAGS: None   WEIGHT BEARING RESTRICTIONS: Yes WB < 5 pounds for 0-12 weeks post op  FALLS:  Has patient fallen in last 6 months? No  LIVING ENVIRONMENT: Lives with: lives with their family Lives in: House/apartment Has following equipment at home:  sling  OCCUPATION: Not working currently; need to be able to lift, push pull, reach overhead up to 40-50 pounds  PLOF: Independent  PATIENT GOALS: be able to use her left arm to clean, lift, and other daily activities  NEXT MD VISIT: 09/12/23  OBJECTIVE:  Note: Objective measures were completed at Evaluation unless otherwise noted.  DIAGNOSTIC FINDINGS: 08/28/23 left shoulder x-ray IMPRESSION: Interval left shoulder arthroplasty without evidence of acute complication.  PATIENT SURVEYS:  FOTO 4.31  COGNITION: Overall cognitive status: Within functional limits for tasks assessed     SENSATION: Patient reports no numbness or tingling.   UPPER EXTREMITY ROM:   Passive ROM Right  Eval  (AROM)  Left eval  Shoulder flexion 137 85  Shoulder extension    Shoulder abduction 142 60  Shoulder adduction    Shoulder internal rotation To T7   Shoulder external rotation To T3 45  Elbow flexion  Elbow extension    Wrist flexion    Wrist extension    Wrist ulnar deviation    Wrist radial deviation    Wrist pronation    Wrist supination    (Blank rows = not tested)  UPPER EXTREMITY MMT: not tested due to surgical condition  PALPATION:  TTP: right deltoid                                                                                                                             TREATMENT DATE:   10/12/23   EXERCISE LOG  Exercise Repetitions and Resistance Comments  AA Flexion 2# Cane x 20 reps   AA Chest Press 2# Cane x 20 reps    AA Bicep Curls    Wall Wash 5 mins   Wall Ladder 5 reps (max 31)   Rows Yellow x 20 reps bil   Extension Yellow x 20 reps bil   Bicep Curls 3-way 1# x 25 reps each   Pulleys 6 mins   Ranger Flex/ext; small rainbows x 2 mins each   PROM     Blank cell = exercise not performed today   Manual Therapy Soft Tissue Mobilization: left shoulder, STW/M to left deltoid and bicep to decrease pain and tone    09/21/23:                                  EXERCISE LOG  Exercise Repetitions and Resistance Comments  Pulley 6 mins        Blank cell = exercise not performed today   Manual Therapy Soft Tissue Mobilization: left shoulder, STW/M to left deltoid, bicep, and upper trap to decrease pain and tone    Modalities  Date:  Unattended Estim: Shoulder, IFC 80-150 Hz, 15 mins, Pain and Tone Vaso: Shoulder, 34 degrees; low pressure, 15 mins, Pain and Edema                                    09/14/23 EXERCISE LOG  Exercise Repetitions and Resistance Comments  Iso Flexion  AP and lateral   Iso Abduction    Iso ER    Wall Wash 5 mins   Wall Ladder 5 reps (max # 27)   Scapular retraction     Pulleys 6 mins   Ranger Flex/ext; CW and CCW  circles x 2 mins each   PROM Flexion and ER with gentle hold at end range    Blank cell = exercise not performed today   Manual Therapy Soft Tissue Mobilization: left shoulder, STW/M to left deltoid and bicep to decrease pain and tone      PATIENT EDUCATION: Education details: healing, prognosis, and surgical condition Person educated: Patient Education method: Explanation Education comprehension: verbalized understanding  HOME EXERCISE PROGRAM: [id number]  ASSESSMENT:  CLINICAL  IMPRESSION: Pt arrives for today's treatment session reporting 2/10 left shoulder pain.  Pt states that her MD is pleased with her progress to this point.  Pt cleared to wean out of sling and begin driving.  Pt able to progress to standing UE ranger today and increased max number to 31 on wall ladder.  Pt able to tolerate increased resistance with seated AA cane exercises today as well.  Pt requiring min cues with newly added thera-band exercises.  STW/M performed to right bicep to decrease pain and tone.  Normal responses to estim and vaso noted upon removal.  Pt denied any pain at completion of today's treatment session.    OBJECTIVE IMPAIRMENTS: decreased activity tolerance, decreased ROM, decreased strength, hypomobility, increased edema, impaired tone, impaired UE functional use, and pain.   ACTIVITY LIMITATIONS: carrying, lifting, sleeping, bathing, toileting, dressing, reach over head, and hygiene/grooming  PARTICIPATION LIMITATIONS: meal prep, cleaning, laundry, shopping, community activity, occupation, and yard work  PERSONAL FACTORS: 3+ comorbidities: History of cancer, anxiety, chronic low back pain, and anxiety  are also affecting patient's functional outcome.   REHAB POTENTIAL: Good  CLINICAL DECISION MAKING: Evolving/moderate complexity  EVALUATION COMPLEXITY: Moderate   GOALS: Goals reviewed with patient? Yes  SHORT TERM GOALS: Target date: 10/03/23  Patient will be independent with her  initial HEP.  Baseline: Goal status: MET  2.  Patient will be able to demonstrate at least 125 degrees of passive left shoulder flexion for improved shoulder mobility.  Baseline: 1/28: 133 degrees (pulleys) Goal status: MET  3.  Patient will be able to demonstrate at least 110 degrees of passive left shoulder abduction for improved shoulder mobility. Baseline: 1/28: 128 degrees Goal status: MET  LONG TERM GOALS: Target date: 10/31/23  Patient will be independent with her advanced HEP.  Baseline:  Goal status: IN PROGRESS  2.  Patient will be able to demonstrate at least 120 degrees of active shoulder flexion for improved function reaching overhead. Baseline: 2/25: 92 degrees Goal status: IN PROGRESS  3.  Patient will be able to demonstrate at least 120 degrees of active left shoulder abduction for improved function reaching overhead. Baseline: 2/25: 80 degrees Goal status: IN PROGRESS  4.  Patient will be able to carry at least 8 pounds in her left hand for improved function carrying her groceries.  Baseline: 2/25: unable d/t protocol Goal status: IN PROGRESS  5.  Patient will report being able to wash her hair without being limited by her familiar left shoulder symptoms.  Baseline:  Goal status: IN PROGRESS  PLAN:  PT FREQUENCY: 2x/week  PT DURATION: 8 weeks  PLANNED INTERVENTIONS: 97164- PT Re-evaluation, 97110-Therapeutic exercises, 97530- Therapeutic activity, 97112- Neuromuscular re-education, 97535- Self Care, 16109- Manual therapy, 97014- Electrical stimulation (unattended), 97016- Vasopneumatic device, Patient/Family education, Joint mobilization, Cryotherapy, and Moist heat  PLAN FOR NEXT SESSION: see protocol for appropriate exercise progression   Newman Pies, PTA 10/12/2023, 9:52 AM

## 2023-10-17 ENCOUNTER — Ambulatory Visit: Payer: 59 | Attending: Orthopedic Surgery

## 2023-10-17 DIAGNOSIS — M25512 Pain in left shoulder: Secondary | ICD-10-CM | POA: Insufficient documentation

## 2023-10-17 DIAGNOSIS — M25612 Stiffness of left shoulder, not elsewhere classified: Secondary | ICD-10-CM | POA: Insufficient documentation

## 2023-10-17 NOTE — Therapy (Signed)
 OUTPATIENT PHYSICAL THERAPY SHOULDER TREATMENT   Patient Name: [name] MRN: [id number] DOB:[birth year], 58 y.o., female Today's Date: 10/17/2023  END OF SESSION:  PT End of Session - 10/17/23 0848     Visit Number 11    Number of Visits 16    Date for PT Re-Evaluation 11/10/23    PT Start Time 0845    PT Stop Time 0946    PT Time Calculation (min) 61 min    Activity Tolerance Patient tolerated treatment well    Behavior During Therapy WFL for tasks assessed/performed              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   Closed fracture of left distal fibula 04/27/2018   Constipation    SOME BLOOD IN STOOL   GERD (gastroesophageal reflux disease)    occasionally-NO MEDS   History of kidney stones    PONV (postoperative nausea and vomiting)    used a scop patch last surgery-was better   Sleep disorder 07/10/2017   SVT (supraventricular tachycardia) (HCC) 09/2022   Past Surgical History:  Procedure Laterality Date   ABDOMINAL HYSTERECTOMY  08/15/1993   partial   BACK SURGERY  08/15/2013   COLON SURGERY     COLONOSCOPY  06/01/2012   Procedure: COLONOSCOPY;  Surgeon: West Bali, MD;  [facility]: [facility];  Service: Endoscopy;  Laterality: N/A;  1:30PM   COLONOSCOPY N/A 06/14/2013   XLK:GMWN diverticulosis/normal anastomosis   COLONOSCOPY WITH PROPOFOL N/A 02/18/2020   Procedure: COLONOSCOPY WITH PROPOFOL;  Surgeon: Corbin Ade, MD;  [facility]: [facility];  Service: Endoscopy;  Laterality: N/A;  10:00am   endoscopic left fallopian tube removed  several yrs ago   EUS  06/06/2012   Procedure: LOWER ENDOSCOPIC ULTRASOUND (EUS);  Surgeon: Willis Modena, MD;  Location: [facility];  Service: Endoscopy;  Laterality: N/A;   EXAMINATION UNDER ANESTHESIA  09/07/2012   Procedure: EXAM UNDER ANESTHESIA;  Surgeon: Almond Lint, MD;   Location: [facility];  Service: General;  Laterality: N/A;   EXCISION/RELEASE BURSA HIP  09/15/2011   Dr Shelle Iron EXCISION/RELEASE BURSA HIP;  Surgeon: Javier Docker, MD;  [facility];  Service: Orthopedics;  Laterality: Left;  Excision of Trochanteric Bursitis   FLEXIBLE SIGMOIDOSCOPY N/A 03/22/2013   Procedure: FLEXIBLE SIGMOIDOSCOPY;  Surgeon: West Bali, MD;  [facility]: [facility];  Service: Endoscopy;  Laterality: N/A;  2:00   LAPAROSCOPIC LOW ANTERIOR RESECTION  07/02/2012   Procedure: LAPAROSCOPIC LOW ANTERIOR RESECTION;  Surgeon: Almond Lint, MD;  Location: [facility];  Service: General;  Laterality: N/A;   left ankle surgery for fx  several yrs ago   LUMBAR LAMINECTOMY/DECOMPRESSION MICRODISCECTOMY N/A 05/14/2014   Procedure: LUMBAR DECOMPRESSION L2-L3;  Surgeon: Javier Docker, MD;  Location: [facility];  Service: Orthopedics;  Laterality: N/A;   MASS EXCISION Left 02/02/2018   Procedure: EXCISION CYST, LEFT INNER THIGH;  Surgeon: Franky Macho, MD;  Location: [facility];  Service: General;  Laterality: Left;   PROCTOSCOPY  09/07/2012   Procedure: PROCTOSCOPY;  Surgeon: Almond Lint, MD;  [facility];  Service: General;  Laterality: N/A;   rotator cuff surg Right    TOTAL SHOULDER ARTHROPLASTY Left 08/28/2023   Procedure: LEFT TOTAL SHOULDER ARTHROPLASTY;  Surgeon: Oliver Barre, MD;  Location: [facility];  Service: Orthopedics;  Laterality: Left;  NEEDS RNFA   Patient Active Problem List   Diagnosis Date Noted   Glenohumeral arthritis, left 08/28/2023   BMI 31.0-31.9,adult 02/17/2023   Fever blister 10/05/2020   Chronic low back pain 09/01/2017   Migraine without aura 08/10/2017   Sleep disorder 07/10/2017   GERD (gastroesophageal reflux disease) 05/03/2017   HNP (herniated nucleus pulposus), lumbar 05/14/2014   Internal and external prolapsed hemorrhoids 03/15/2013   Anxiety state 03/15/2013   Rectal cancer, cT2N0, 7 cm from anal verge  06/11/2012   Constipation 05/25/2012   REFERRING PROVIDER: Oliver Barre, MD   REFERRING DIAG: Status post total shoulder arthroplasty, left   THERAPY DIAG:  Acute pain of left shoulder  Stiffness of left shoulder, not elsewhere classified  Rationale for Evaluation and Treatment: Rehabilitation  ONSET DATE: 08/28/23  SUBJECTIVE:                                                                                                                                                                                      SUBJECTIVE STATEMENT: Pt reports 1/10 left shoulder pain today.    Hand dominance: Left  PERTINENT HISTORY: History of cancer, anxiety, chronic low back pain, and anxiety  PAIN:  Are you having pain? Yes: NPRS scale: 1/10 Pain location: left anterior shoulder to the elbow Pain description: aching and intermittent stinging Aggravating factors: none known Relieving factors: ice and medication  PRECAUTIONS: Shoulder  RED FLAGS: None   WEIGHT BEARING RESTRICTIONS: Yes WB < 5 pounds for 0-12 weeks post op  FALLS:  Has patient fallen in last 6 months? No  LIVING ENVIRONMENT: Lives with: lives with their family Lives in: House/apartment Has following equipment at home:  sling  OCCUPATION: Not working currently; need to be able to lift, push pull, reach overhead up to 40-50 pounds  PLOF: Independent  PATIENT GOALS: be able to use her left arm to clean, lift, and other daily activities  NEXT MD VISIT: 09/12/23  OBJECTIVE:  Note: Objective measures were completed at Evaluation unless otherwise noted.  DIAGNOSTIC FINDINGS: 08/28/23 left shoulder x-ray IMPRESSION: Interval left shoulder arthroplasty without evidence of acute complication.  PATIENT SURVEYS:  FOTO 4.31  COGNITION: Overall cognitive status: Within functional limits for tasks assessed     SENSATION: Patient reports no numbness or tingling.   UPPER EXTREMITY ROM:   Passive ROM Right  Eval  (AROM)  Left eval  Shoulder flexion 137 85  Shoulder extension    Shoulder abduction 142 60  Shoulder adduction    Shoulder internal rotation To T7   Shoulder external rotation To T3 45  Elbow flexion  Elbow extension    Wrist flexion    Wrist extension    Wrist ulnar deviation    Wrist radial deviation    Wrist pronation    Wrist supination    (Blank rows = not tested)  UPPER EXTREMITY MMT: not tested due to surgical condition  PALPATION:  TTP: right deltoid                                                                                                                             TREATMENT DATE:   10/17/23   EXERCISE LOG  Exercise Repetitions and Resistance Comments  UBE 120 RPM 6 mins (3 mins forward/3 backwards)   AA Flexion 2# Cane x 25 reps   AA Chest Press 2# Cane x 25 reps    AA Bicep Curls    Wall Wash 5 mins   Wall Ladder    Rows Yellow x 25 reps bil   Extension Yellow x 25 reps bil   Bicep Curls 3-way 1# x 30 reps each   Pulleys 6 mins   Ranger Flex/ext; small rainbows x 2.5 mins each   PROM     Blank cell = exercise not performed today   Manual Therapy Soft Tissue Mobilization: left shoulder, STW/M to left deltoid and bicep to decrease pain and tone   Modalities  Date:  Unattended Estim: Shoulder, IFC 80-150 Hz, 15 mins, Pain and Tone Vaso: Shoulder, 34 degrees; low pressure, 15 mins, Pain and Edema  09/21/23:                                  EXERCISE LOG  Exercise Repetitions and Resistance Comments  Pulley 6 mins        Blank cell = exercise not performed today   Manual Therapy Soft Tissue Mobilization: left shoulder, STW/M to left deltoid, bicep, and upper trap to decrease pain and tone    Modalities  Date:  Unattended Estim: Shoulder, IFC 80-150 Hz, 15 mins, Pain and Tone Vaso: Shoulder, 34 degrees; low pressure, 15 mins, Pain and Edema                                    09/14/23 EXERCISE LOG  Exercise Repetitions and Resistance  Comments  Iso Flexion  AP and lateral   Iso Abduction    Iso ER    Wall Wash 5 mins   Wall Ladder 5 reps (max # 27)   Scapular retraction     Pulleys 6 mins   Ranger Flex/ext; CW and CCW circles x 2 mins each   PROM Flexion and ER with gentle hold at end range    Blank cell = exercise not performed today   Manual Therapy Soft Tissue Mobilization: left shoulder, STW/M to left deltoid and bicep to decrease pain and  tone      PATIENT EDUCATION: Education details: healing, prognosis, and surgical condition Person educated: Patient Education method: Explanation Education comprehension: verbalized understanding  HOME EXERCISE PROGRAM: [id number]  Rows and extensions  ASSESSMENT:  CLINICAL IMPRESSION: Pt arrives for today's treatment session reporting 1/10 left shoulder pain.  Pt introduced to UBE today with minimal discomfort when performing backward motion.  Pt able to tolerate increased reps with all other previously performed exercises today.  Pt given yellow t-band for home use and rows and extensions added to HEP.  STW/M to left deltoid and bicep to decrease pain and tone.  Normal responses to estim and vaso noted upon removal.  Pt reported minimal soreness at completion of today's treatment session.   OBJECTIVE IMPAIRMENTS: decreased activity tolerance, decreased ROM, decreased strength, hypomobility, increased edema, impaired tone, impaired UE functional use, and pain.   ACTIVITY LIMITATIONS: carrying, lifting, sleeping, bathing, toileting, dressing, reach over head, and hygiene/grooming  PARTICIPATION LIMITATIONS: meal prep, cleaning, laundry, shopping, community activity, occupation, and yard work  PERSONAL FACTORS: 3+ comorbidities: History of cancer, anxiety, chronic low back pain, and anxiety  are also affecting patient's functional outcome.   REHAB POTENTIAL: Good  CLINICAL DECISION MAKING: Evolving/moderate complexity  EVALUATION COMPLEXITY:  Moderate   GOALS: Goals reviewed with patient? Yes  SHORT TERM GOALS: Target date: 10/03/23  Patient will be independent with her initial HEP.  Baseline: Goal status: MET  2.  Patient will be able to demonstrate at least 125 degrees of passive left shoulder flexion for improved shoulder mobility.  Baseline: 1/28: 133 degrees (pulleys) Goal status: MET  3.  Patient will be able to demonstrate at least 110 degrees of passive left shoulder abduction for improved shoulder mobility. Baseline: 1/28: 128 degrees Goal status: MET  LONG TERM GOALS: Target date: 10/31/23  Patient will be independent with her advanced HEP.  Baseline:  Goal status: IN PROGRESS  2.  Patient will be able to demonstrate at least 120 degrees of active shoulder flexion for improved function reaching overhead. Baseline: 2/25: 92 degrees Goal status: IN PROGRESS  3.  Patient will be able to demonstrate at least 120 degrees of active left shoulder abduction for improved function reaching overhead. Baseline: 2/25: 80 degrees Goal status: IN PROGRESS  4.  Patient will be able to carry at least 8 pounds in her left hand for improved function carrying her groceries.  Baseline: 2/25: unable d/t protocol Goal status: IN PROGRESS  5.  Patient will report being able to wash her hair without being limited by her familiar left shoulder symptoms.  Baseline:  Goal status: IN PROGRESS  PLAN:  PT FREQUENCY: 2x/week  PT DURATION: 8 weeks  PLANNED INTERVENTIONS: 97164- PT Re-evaluation, 97110-Therapeutic exercises, 97530- Therapeutic activity, 97112- Neuromuscular re-education, 97535- Self Care, 40981- Manual therapy, 97014- Electrical stimulation (unattended), 97016- Vasopneumatic device, Patient/Family education, Joint mobilization, Cryotherapy, and Moist heat  PLAN FOR NEXT SESSION: see protocol for appropriate exercise progression   Newman Pies, PTA 10/17/2023, 10:08 AM

## 2023-10-19 ENCOUNTER — Encounter: Payer: Self-pay | Admitting: Nurse Practitioner

## 2023-10-19 ENCOUNTER — Ambulatory Visit: Payer: 59 | Admitting: Nurse Practitioner

## 2023-10-19 VITALS — BP 121/79 | HR 85 | Temp 97.2°F | Ht 66.0 in | Wt 187.0 lb

## 2023-10-19 DIAGNOSIS — M5441 Lumbago with sciatica, right side: Secondary | ICD-10-CM | POA: Diagnosis not present

## 2023-10-19 DIAGNOSIS — K5900 Constipation, unspecified: Secondary | ICD-10-CM

## 2023-10-19 DIAGNOSIS — G479 Sleep disorder, unspecified: Secondary | ICD-10-CM

## 2023-10-19 DIAGNOSIS — G43009 Migraine without aura, not intractable, without status migrainosus: Secondary | ICD-10-CM

## 2023-10-19 DIAGNOSIS — K219 Gastro-esophageal reflux disease without esophagitis: Secondary | ICD-10-CM

## 2023-10-19 DIAGNOSIS — G8929 Other chronic pain: Secondary | ICD-10-CM

## 2023-10-19 DIAGNOSIS — Z6831 Body mass index (BMI) 31.0-31.9, adult: Secondary | ICD-10-CM | POA: Diagnosis not present

## 2023-10-19 DIAGNOSIS — F411 Generalized anxiety disorder: Secondary | ICD-10-CM | POA: Diagnosis not present

## 2023-10-19 MED ORDER — WEGOVY 2.4 MG/0.75ML ~~LOC~~ SOAJ: 2.4000 mg | SUBCUTANEOUS | 3 refills | Status: DC

## 2023-10-19 NOTE — Patient Instructions (Signed)

## 2023-10-19 NOTE — Progress Notes (Signed)
 Subjective:    Patient ID: [name], female    DOB: 25-Nov-1965, 58 y.o.   MRN: [id number]   Chief Complaint: medical management of chronic issues     HPI:  [name] is a 58 y.o. who identifies as a female who was assigned female at birth.   Social history: Lives with: husband Work history:    Comes in today for follow up of the following chronic medical issues:  1. Gastroesophageal reflux disease, unspecified whether esophagitis present Is on nexium and is working well.  2. Migraine without aura and without status migrainosus, not intractable Have not had in several years.  3. Anxiety state Is on lexapro which seems to help her    04/19/2023    2:49 PM 02/17/2023    3:17 PM 10/03/2022    3:44 PM 06/23/2022    8:24 AM  GAD 7 : Generalized Anxiety Score  Nervous, Anxious, on Edge 0 0 1 0  Control/stop worrying 0 0 0 0  Worry too much - different things 0 0 0 0  Trouble relaxing 0 0 1 0  Restless 0 0 0 0  Easily annoyed or irritable 0 0 0 0  Afraid - awful might happen 0 0 0 0  Total GAD 7 Score 0 0 2 0  Anxiety Difficulty Not difficult at all Not difficult at all Not difficult at all Not difficult at all      4. Constipation, unspecified constipation type She still has and has to take meds to have bowel movementt. She is going to follow up with GI  5. Sleep disorder No issues as of late.  6. BMI 31.0-31.9,adult Has been on wegovy for weight loss. Weight is down Wt Readings from Last 3 Encounters:  08/21/23 186 lb 11.7 oz (84.7 kg)  06/12/23 186 lb 12.8 oz (84.7 kg)  05/24/23 189 lb (85.7 kg)   BMI Readings from Last 3 Encounters:  08/28/23 30.14 kg/m  08/21/23 30.14 kg/m  06/12/23 30.15 kg/m      New complaints: None today  Allergies  Allergen Reactions   Codeine Nausea Only    Confirmed intolerance of codeine verbally with pt in PACU, pt states does not cause any SOB or dyspnea, some nausea only (MN-RN)   Ultram  [Tramadol] Nausea And Vomiting    Ultram allergy also discussed, active N&V occurs with Ultram   Hydrocodone Hives   Latex Itching and Rash   Outpatient Encounter Medications as of 10/19/2023  Medication Sig   cyclobenzaprine (FLEXERIL) 10 MG tablet Take 10 mg by mouth 3 (three) times daily as needed for muscle spasms.   diltiazem (CARDIZEM) 30 MG tablet Take 30 mg by mouth 4 (four) times daily as needed (tachycardia).   escitalopram (LEXAPRO) 10 MG tablet Take 1 tablet (10 mg total) by mouth daily.   esomeprazole (NEXIUM) 40 MG capsule TAKE 1 CAPSULE (40 MG TOTAL) BY MOUTH DAILY BEFORE BREAKFAST. (NEEDS TO BE SEEN BEFORE NEXT REFILL)   estradiol (ESTRACE) 1 MG tablet TAKE 1.5 TABLETS BY MOUTH DAILY.   gabapentin (NEURONTIN) 100 MG capsule TAKE 1 CAPSULE (100 MG TOTAL) BY MOUTH THREE TIMES DAILY. (Patient taking differently: Take 100 mg by mouth at bedtime.)   linaclotide (LINZESS) 290 MCG CAPS capsule Take 290 mcg by mouth daily as needed (constipation).   Semaglutide-Weight Management (WEGOVY) 1.7 MG/0.75ML SOAJ INJECT 1.7 MG INTO THE SKIN ONCE A WEEK.   No facility-administered encounter medications on file as of 10/19/2023.  Past Surgical History:  Procedure Laterality Date   ABDOMINAL HYSTERECTOMY  08/15/1993   partial   BACK SURGERY  08/15/2013   COLON SURGERY     COLONOSCOPY  06/01/2012   Procedure: COLONOSCOPY;  Surgeon: West Bali, MD;  [facility]: [facility];  Service: Endoscopy;  Laterality: N/A;  1:30PM   COLONOSCOPY N/A 06/14/2013   AVW:UJWJ diverticulosis/normal anastomosis   COLONOSCOPY WITH PROPOFOL N/A 02/18/2020   Procedure: COLONOSCOPY WITH PROPOFOL;  Surgeon: Corbin Ade, MD;  [facility]: [facility];  Service: Endoscopy;  Laterality: N/A;  10:00am   endoscopic left fallopian tube removed  several yrs ago   EUS  06/06/2012   Procedure: LOWER ENDOSCOPIC ULTRASOUND (EUS);  Surgeon: Willis Modena, MD;  Location: [facility];  Service: Endoscopy;  Laterality:  N/A;   EXAMINATION UNDER ANESTHESIA  09/07/2012   Procedure: EXAM UNDER ANESTHESIA;  Surgeon: Almond Lint, MD;  Location: [facility];  Service: General;  Laterality: N/A;   EXCISION/RELEASE BURSA HIP  09/15/2011   Dr Shelle Iron EXCISION/RELEASE BURSA HIP;  Surgeon: Javier Docker, MD;  Location: [facility];  Service: Orthopedics;  Laterality: Left;  Excision of Trochanteric Bursitis   FLEXIBLE SIGMOIDOSCOPY N/A 03/22/2013   Procedure: FLEXIBLE SIGMOIDOSCOPY;  Surgeon: West Bali, MD;  [facility];  Service: Endoscopy;  Laterality: N/A;  2:00   LAPAROSCOPIC LOW ANTERIOR RESECTION  07/02/2012   Procedure: LAPAROSCOPIC LOW ANTERIOR RESECTION;  Surgeon: Almond Lint, MD;  Location: [facility];  Service: General;  Laterality: N/A;   left ankle surgery for fx  several yrs ago   LUMBAR LAMINECTOMY/DECOMPRESSION MICRODISCECTOMY N/A 05/14/2014   Procedure: LUMBAR DECOMPRESSION L2-L3;  Surgeon: Javier Docker, MD;  Location: [facility];  Service: Orthopedics;  Laterality: N/A;   MASS EXCISION Left 02/02/2018   Procedure: EXCISION CYST, LEFT INNER THIGH;  Surgeon: Franky Macho, MD;  Location: [facility];  Service: General;  Laterality: Left;   PROCTOSCOPY  09/07/2012   Procedure: PROCTOSCOPY;  Surgeon: Almond Lint, MD;  Location: [facility];  Service: General;  Laterality: N/A;   rotator cuff surg Right    TOTAL SHOULDER ARTHROPLASTY Left 08/28/2023   Procedure: LEFT TOTAL SHOULDER ARTHROPLASTY;  Surgeon: Oliver Barre, MD;  Location: [facility];  Service: Orthopedics;  Laterality: Left;  NEEDS RNFA    Family History  Problem Relation Age of Onset   Other Sister    Migraines Mother    Colon cancer Paternal Aunt       Controlled substance contract: n/a     Review of Systems  Constitutional:  Negative for diaphoresis.  Eyes:  Negative for pain.  Respiratory:  Negative for shortness of breath.   Cardiovascular:  Negative for chest pain, palpitations and leg  swelling.  Gastrointestinal:  Negative for abdominal pain.  Endocrine: Negative for polydipsia.  Skin:  Negative for rash.  Neurological:  Negative for dizziness, weakness and headaches.  Hematological:  Does not bruise/bleed easily.  All other systems reviewed and are negative.      Objective:   Physical Exam Vitals and nursing note reviewed.  Constitutional:      General: She is not in acute distress.    Appearance: Normal appearance. She is well-developed.  HENT:     Head: Normocephalic.     Right Ear: Tympanic membrane normal.     Left Ear: Tympanic membrane normal.     Nose: Nose normal.     Mouth/Throat:     Mouth: Mucous membranes are  moist.  Eyes:     Pupils: Pupils are equal, round, and reactive to light.  Neck:     Vascular: No carotid bruit or JVD.  Cardiovascular:     Rate and Rhythm: Normal rate and regular rhythm.     Heart sounds: Normal heart sounds.  Pulmonary:     Effort: Pulmonary effort is normal. No respiratory distress.     Breath sounds: Normal breath sounds. No wheezing or rales.  Chest:     Chest wall: No tenderness.  Abdominal:     General: Bowel sounds are normal. There is no distension or abdominal bruit.     Palpations: Abdomen is soft. There is no hepatomegaly, splenomegaly, mass or pulsatile mass.     Tenderness: There is no abdominal tenderness.  Musculoskeletal:        General: Normal range of motion.     Cervical back: Normal range of motion and neck supple.  Lymphadenopathy:     Cervical: No cervical adenopathy.  Skin:    General: Skin is warm and dry.  Neurological:     Mental Status: She is alert and oriented to person, place, and time.     Deep Tendon Reflexes: Reflexes are normal and symmetric.  Psychiatric:        Behavior: Behavior normal.        Thought Content: Thought content normal.        Judgment: Judgment normal.    BP 121/79   Pulse 85   Temp (!) 97.2 F (36.2 C) (Temporal)   Ht 5\' 6"  (1.676 m)   Wt 187 lb  (84.8 kg)   SpO2 100%   BMI 30.18 kg/m          Assessment & Plan:  [name] comes in today with chief complaint of medical management of chronic issues    Diagnosis and orders addressed:  1. Gastroesophageal reflux disease, unspecified whether esophagitis present Avoid spicy foods Do not eat 2 hours prior to bedtime  - esomeprazole (NEXIUM) 40 MG capsule; Take 1 capsule (40 mg total) by mouth 2 (two) times daily before a meal.  Dispense: 180 capsule; Refill: 1  2. Migraine without aura and without status migrainosus, not intractable Report new onset of migraines  3. Anxiety state Stress management - escitalopram (LEXAPRO) 10 MG tablet; Take 1 tablet (10 mg total) by mouth daily. (NEEDS TO BE SEEN BEFORE NEXT REFILL)  Dispense: 30 tablet; Refill: 0  4. Constipation, unspecified constipation type Continue with miralax  5. Sleep disorder Bedtime routine  6. BMI 31.0-31.9,adult Discussed diet and exercise for person with BMI >25 Increased wegovy to 2.4mg  weekly Will recheck weight in 3-6 months Continue wegovey as prescribed   Labs pending Health Maintenance reviewed Diet and exercise encouraged  Follow up plan: 6 months   Mary-Margaret Daphine Deutscher, FNP

## 2023-10-20 ENCOUNTER — Ambulatory Visit: Payer: 59

## 2023-10-20 DIAGNOSIS — M25512 Pain in left shoulder: Secondary | ICD-10-CM | POA: Diagnosis not present

## 2023-10-20 DIAGNOSIS — M25612 Stiffness of left shoulder, not elsewhere classified: Secondary | ICD-10-CM

## 2023-10-20 NOTE — Therapy (Signed)
 OUTPATIENT PHYSICAL THERAPY SHOULDER TREATMENT   Patient Name: [name] MRN: [id number] DOB:05/28/1966, 58 y.o., female Today's Date: 10/20/2023  END OF SESSION:  PT End of Session - 10/20/23 0849     Visit Number 12    Number of Visits 16    Date for PT Re-Evaluation 11/10/23    PT Start Time 0845    PT Stop Time 0950    PT Time Calculation (min) 65 min    Activity Tolerance Patient tolerated treatment well    Behavior During Therapy WFL for tasks assessed/performed              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   Closed fracture of left distal fibula 04/27/2018   Constipation    SOME BLOOD IN STOOL   GERD (gastroesophageal reflux disease)    occasionally-NO MEDS   History of kidney stones    PONV (postoperative nausea and vomiting)    used a scop patch last surgery-was better   Sleep disorder 07/10/2017   SVT (supraventricular tachycardia) (HCC) 09/2022   Past Surgical History:  Procedure Laterality Date   ABDOMINAL HYSTERECTOMY  08/15/1993   partial   BACK SURGERY  08/15/2013   COLON SURGERY     COLONOSCOPY  06/01/2012   Procedure: COLONOSCOPY;  Surgeon: West Bali, MD;  [facility];  Service: Endoscopy;  Laterality: N/A;  1:30PM   COLONOSCOPY N/A 06/14/2013   WUJ:WJXB diverticulosis/normal anastomosis   COLONOSCOPY WITH PROPOFOL N/A 02/18/2020   Procedure: COLONOSCOPY WITH PROPOFOL;  Surgeon: Corbin Ade, MD;  [facility];  Service: Endoscopy;  Laterality: N/A;  10:00am   endoscopic left fallopian tube removed  several yrs ago   EUS  06/06/2012   Procedure: LOWER ENDOSCOPIC ULTRASOUND (EUS);  Surgeon: Willis Modena, MD;  Location: [facility];  Service: Endoscopy;  Laterality: N/A;   EXAMINATION UNDER ANESTHESIA  09/07/2012   Procedure: EXAM UNDER ANESTHESIA;  Surgeon: Almond Lint, MD;   Location: [facility];  Service: General;  Laterality: N/A;   EXCISION/RELEASE BURSA HIP  09/15/2011   Dr Shelle Iron EXCISION/RELEASE BURSA HIP;  Surgeon: Javier Docker, MD;  Location: [facility];  Service: Orthopedics;  Laterality: Left;  Excision of Trochanteric Bursitis   FLEXIBLE SIGMOIDOSCOPY N/A 03/22/2013   Procedure: FLEXIBLE SIGMOIDOSCOPY;  Surgeon: West Bali, MD;  [facility];  Service: Endoscopy;  Laterality: N/A;  2:00   LAPAROSCOPIC LOW ANTERIOR RESECTION  07/02/2012   Procedure: LAPAROSCOPIC LOW ANTERIOR RESECTION;  Surgeon: Almond Lint, MD;  [facility];  Service: General;  Laterality: N/A;   left ankle surgery for fx  several yrs ago   LUMBAR LAMINECTOMY/DECOMPRESSION MICRODISCECTOMY N/A 05/14/2014   Procedure: LUMBAR DECOMPRESSION L2-L3;  Surgeon: Javier Docker, MD;  Location: [facility];  Service: Orthopedics;  Laterality: N/A;   MASS EXCISION Left 02/02/2018   Procedure: EXCISION CYST, LEFT INNER THIGH;  Surgeon: Franky Macho, MD;  Location: [facility];  Service: General;  Laterality: Left;   PROCTOSCOPY  09/07/2012   Procedure: PROCTOSCOPY;  Surgeon: Almond Lint, MD;  Location: [facility];  Service: General;  Laterality: N/A;   rotator cuff surg Right    TOTAL SHOULDER ARTHROPLASTY Left 08/28/2023   Procedure: LEFT TOTAL SHOULDER ARTHROPLASTY;  Surgeon: Oliver Barre, MD;  Location: [facility];  Service: Orthopedics;  Laterality: Left;  NEEDS RNFA   Patient Active Problem List   Diagnosis Date Noted   Glenohumeral arthritis, left 08/28/2023   BMI 31.0-31.9,adult 02/17/2023   Fever blister 10/05/2020   Chronic low back pain 09/01/2017   Migraine without aura 08/10/2017   Sleep disorder 07/10/2017   GERD (gastroesophageal reflux disease) 05/03/2017   HNP (herniated nucleus pulposus), lumbar 05/14/2014   Internal and external prolapsed hemorrhoids 03/15/2013   Anxiety state 03/15/2013   Rectal cancer, cT2N0, 7 cm from anal verge  06/11/2012   Constipation 05/25/2012   REFERRING PROVIDER: Oliver Barre, MD   REFERRING DIAG: Status post total shoulder arthroplasty, left   THERAPY DIAG:  Acute pain of left shoulder  Stiffness of left shoulder, not elsewhere classified  Rationale for Evaluation and Treatment: Rehabilitation  ONSET DATE: 08/28/23  SUBJECTIVE:                                                                                                                                                                                      SUBJECTIVE STATEMENT: Pt reports 1-2/10 left shoulder pain today.    Hand dominance: Left  PERTINENT HISTORY: History of cancer, anxiety, chronic low back pain, and anxiety  PAIN:  Are you having pain? Yes: NPRS scale: 1-2/10 Pain location: left anterior shoulder to the elbow Pain description: aching and intermittent stinging Aggravating factors: none known Relieving factors: ice and medication  PRECAUTIONS: Shoulder  RED FLAGS: None   WEIGHT BEARING RESTRICTIONS: Yes WB < 5 pounds for 0-12 weeks post op  FALLS:  Has patient fallen in last 6 months? No  LIVING ENVIRONMENT: Lives with: lives with their family Lives in: House/apartment Has following equipment at home:  sling  OCCUPATION: Not working currently; need to be able to lift, push pull, reach overhead up to 40-50 pounds  PLOF: Independent  PATIENT GOALS: be able to use her left arm to clean, lift, and other daily activities  NEXT MD VISIT: 09/12/23  OBJECTIVE:  Note: Objective measures were completed at Evaluation unless otherwise noted.  DIAGNOSTIC FINDINGS: 08/28/23 left shoulder x-ray IMPRESSION: Interval left shoulder arthroplasty without evidence of acute complication.  PATIENT SURVEYS:  FOTO 4.31  COGNITION: Overall cognitive status: Within functional limits for tasks assessed     SENSATION: Patient reports no numbness or tingling.   UPPER EXTREMITY ROM:   Passive ROM Right   Eval (AROM)  Left eval  Shoulder flexion 137 85  Shoulder extension    Shoulder abduction 142 60  Shoulder adduction    Shoulder internal rotation To T7   Shoulder external rotation To T3 45  Elbow flexion  Elbow extension    Wrist flexion    Wrist extension    Wrist ulnar deviation    Wrist radial deviation    Wrist pronation    Wrist supination    (Blank rows = not tested)  UPPER EXTREMITY MMT: not tested due to surgical condition  PALPATION:  TTP: right deltoid                                                                                                                             TREATMENT DATE:   10/20/23   EXERCISE LOG  Exercise Repetitions and Resistance Comments  UBE 120 RPM 8 mins (4 mins forward/4 backwards)   AA Flexion 2# Cane x 30 reps   AA Chest Press 2# Cane x 30 reps    Ball on Wall 2 mins   Wall Wash    Wall Push Ups 10 reps   Rows Yellow x 30 reps bil   Extension Yellow x 30 reps bil   Bicep Curls 3-way 2# x 20 reps each   Pulleys 6 mins   Ranger Flex/ext; small rainbows x 2.5 mins each   PROM     Blank cell = exercise not performed today   Manual Therapy Soft Tissue Mobilization: left shoulder, STW/M to left deltoid and bicep to decrease pain and tone   Modalities  Date:  Unattended Estim: Shoulder, IFC 80-150 Hz, 15 mins, Pain and Tone Vaso: Shoulder, 34 degrees; low pressure, 15 mins, Pain and Edema  09/21/23:                                  EXERCISE LOG  Exercise Repetitions and Resistance Comments  Pulley 6 mins        Blank cell = exercise not performed today   Manual Therapy Soft Tissue Mobilization: left shoulder, STW/M to left deltoid, bicep, and upper trap to decrease pain and tone    Modalities  Date:  Unattended Estim: Shoulder, IFC 80-150 Hz, 15 mins, Pain and Tone Vaso: Shoulder, 34 degrees; low pressure, 15 mins, Pain and Edema                                    09/14/23 EXERCISE LOG  Exercise Repetitions and  Resistance Comments  Iso Flexion  AP and lateral   Iso Abduction    Iso ER    Wall Wash 5 mins   Wall Ladder 5 reps (max # 27)   Scapular retraction     Pulleys 6 mins   Ranger Flex/ext; CW and CCW circles x 2 mins each   PROM Flexion and ER with gentle hold at end range    Blank cell = exercise not performed today   Manual Therapy Soft Tissue Mobilization: left shoulder, STW/M to left deltoid and bicep to decrease  pain and tone      PATIENT EDUCATION: Education details: healing, prognosis, and surgical condition Person educated: Patient Education method: Explanation Education comprehension: verbalized understanding  HOME EXERCISE PROGRAM: [id number]  Rows and extensions  ASSESSMENT:  CLINICAL IMPRESSION: Pt arrives for today's treatment session reporting 1/10 left shoulder pain.  Pt introduced to ball on the wall activities with good results.  Pt also introduced to wall push ups today with min cues for proper technique.  Pt able to tolerate increased reps or weight with all exercises previously performed.  STW/M to right bicep and deltoid to decrease pain and tone.  Normal responses to estim and vaso noted upon removal. Pt denied any pain at completion of today's treatment session.   OBJECTIVE IMPAIRMENTS: decreased activity tolerance, decreased ROM, decreased strength, hypomobility, increased edema, impaired tone, impaired UE functional use, and pain.   ACTIVITY LIMITATIONS: carrying, lifting, sleeping, bathing, toileting, dressing, reach over head, and hygiene/grooming  PARTICIPATION LIMITATIONS: meal prep, cleaning, laundry, shopping, community activity, occupation, and yard work  PERSONAL FACTORS: 3+ comorbidities: History of cancer, anxiety, chronic low back pain, and anxiety  are also affecting patient's functional outcome.   REHAB POTENTIAL: Good  CLINICAL DECISION MAKING: Evolving/moderate complexity  EVALUATION COMPLEXITY: Moderate   GOALS: Goals reviewed with  patient? Yes  SHORT TERM GOALS: Target date: 10/03/23  Patient will be independent with her initial HEP.  Baseline: Goal status: MET  2.  Patient will be able to demonstrate at least 125 degrees of passive left shoulder flexion for improved shoulder mobility.  Baseline: 1/28: 133 degrees (pulleys) Goal status: MET  3.  Patient will be able to demonstrate at least 110 degrees of passive left shoulder abduction for improved shoulder mobility. Baseline: 1/28: 128 degrees Goal status: MET  LONG TERM GOALS: Target date: 10/31/23  Patient will be independent with her advanced HEP.  Baseline:  Goal status: IN PROGRESS  2.  Patient will be able to demonstrate at least 120 degrees of active shoulder flexion for improved function reaching overhead. Baseline: 2/25: 92 degrees Goal status: IN PROGRESS  3.  Patient will be able to demonstrate at least 120 degrees of active left shoulder abduction for improved function reaching overhead. Baseline: 2/25: 80 degrees Goal status: IN PROGRESS  4.  Patient will be able to carry at least 8 pounds in her left hand for improved function carrying her groceries.  Baseline: 2/25: unable d/t protocol Goal status: IN PROGRESS  5.  Patient will report being able to wash her hair without being limited by her familiar left shoulder symptoms.  Baseline:  Goal status: IN PROGRESS  PLAN:  PT FREQUENCY: 2x/week  PT DURATION: 8 weeks  PLANNED INTERVENTIONS: 97164- PT Re-evaluation, 97110-Therapeutic exercises, 97530- Therapeutic activity, 97112- Neuromuscular re-education, 97535- Self Care, 91478- Manual therapy, 97014- Electrical stimulation (unattended), 97016- Vasopneumatic device, Patient/Family education, Joint mobilization, Cryotherapy, and Moist heat  PLAN FOR NEXT SESSION: see protocol for appropriate exercise progression   Newman Pies, PTA 10/20/2023, 9:53 AM

## 2023-10-24 ENCOUNTER — Ambulatory Visit

## 2023-10-24 DIAGNOSIS — M25512 Pain in left shoulder: Secondary | ICD-10-CM | POA: Diagnosis not present

## 2023-10-24 DIAGNOSIS — M25612 Stiffness of left shoulder, not elsewhere classified: Secondary | ICD-10-CM

## 2023-10-24 NOTE — Therapy (Signed)
 OUTPATIENT PHYSICAL THERAPY SHOULDER TREATMENT   Patient Name: [name] MRN: [id number] DOB:1966-03-25, 58 y.o., female Today's Date: 10/24/2023  END OF SESSION:  PT End of Session - 10/24/23 0758     Visit Number 13    Number of Visits 16    Date for PT Re-Evaluation 11/10/23    PT Start Time 0800    PT Stop Time 0847    PT Time Calculation (min) 47 min    Activity Tolerance Patient tolerated treatment well    Behavior During Therapy WFL for tasks assessed/performed               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   Closed fracture of left distal fibula 04/27/2018   Constipation    SOME BLOOD IN STOOL   GERD (gastroesophageal reflux disease)    occasionally-NO MEDS   History of kidney stones    PONV (postoperative nausea and vomiting)    used a scop patch last surgery-was better   Sleep disorder 07/10/2017   SVT (supraventricular tachycardia) (HCC) 09/2022   Past Surgical History:  Procedure Laterality Date   ABDOMINAL HYSTERECTOMY  08/15/1993   partial   BACK SURGERY  08/15/2013   COLON SURGERY     COLONOSCOPY  06/01/2012   Procedure: COLONOSCOPY;  Surgeon: West Bali, MD;  Location: [facility];  Service: Endoscopy;  Laterality: N/A;  1:30PM   COLONOSCOPY N/A 06/14/2013   [id number] diverticulosis/normal anastomosis   COLONOSCOPY WITH PROPOFOL N/A 02/18/2020   Procedure: COLONOSCOPY WITH PROPOFOL;  Surgeon: Corbin Ade, MD;  Location: [facility];  Service: Endoscopy;  Laterality: N/A;  10:00am   endoscopic left fallopian tube removed  several yrs ago   EUS  06/06/2012   Procedure: LOWER ENDOSCOPIC ULTRASOUND (EUS);  Surgeon: Willis Modena, MD;  Location: [facility];  Service: Endoscopy;  Laterality: N/A;   EXAMINATION UNDER ANESTHESIA  09/07/2012   Procedure: EXAM UNDER ANESTHESIA;  Surgeon: Almond Lint, MD;   Location: [facility];  Service: General;  Laterality: N/A;   EXCISION/RELEASE BURSA HIP  09/15/2011   Dr Shelle Iron EXCISION/RELEASE BURSA HIP;  Surgeon: Javier Docker, MD;  Location: [facility];  Service: Orthopedics;  Laterality: Left;  Excision of Trochanteric Bursitis   FLEXIBLE SIGMOIDOSCOPY N/A 03/22/2013   Procedure: FLEXIBLE SIGMOIDOSCOPY;  Surgeon: West Bali, MD;  Location: [facility];  Service: Endoscopy;  Laterality: N/A;  2:00   LAPAROSCOPIC LOW ANTERIOR RESECTION  07/02/2012   Procedure: LAPAROSCOPIC LOW ANTERIOR RESECTION;  Surgeon: Almond Lint, MD;  Location: [facility];  Service: General;  Laterality: N/A;   left ankle surgery for fx  several yrs ago   LUMBAR LAMINECTOMY/DECOMPRESSION MICRODISCECTOMY N/A 05/14/2014   Procedure: LUMBAR DECOMPRESSION L2-L3;  Surgeon: Javier Docker, MD;  Location: [facility];  Service: Orthopedics;  Laterality: N/A;   MASS EXCISION Left 02/02/2018   Procedure: EXCISION CYST, LEFT INNER THIGH;  Surgeon: Franky Macho, MD;  Location: [facility];  Service: General;  Laterality: Left;   PROCTOSCOPY  09/07/2012   Procedure: PROCTOSCOPY;  Surgeon: Almond Lint, MD;  Location: [facility];  Service: General;  Laterality: N/A;   rotator cuff surg Right    TOTAL SHOULDER ARTHROPLASTY Left 08/28/2023   Procedure: LEFT TOTAL SHOULDER ARTHROPLASTY;  Surgeon: Oliver Barre, MD;  Location: [facility];  Service: Orthopedics;  Laterality: Left;  NEEDS RNFA   Patient Active Problem List   Diagnosis Date Noted   Glenohumeral arthritis, left 08/28/2023   BMI 31.0-31.9,adult 02/17/2023   Fever blister 10/05/2020   Chronic low back pain 09/01/2017   Migraine without aura 08/10/2017   Sleep disorder 07/10/2017   GERD (gastroesophageal reflux disease) 05/03/2017   HNP (herniated nucleus pulposus), lumbar 05/14/2014   Internal and external prolapsed hemorrhoids 03/15/2013   Anxiety state 03/15/2013   Rectal cancer, cT2N0, 7 cm from anal verge  06/11/2012   Constipation 05/25/2012   REFERRING PROVIDER: Oliver Barre, MD   REFERRING DIAG: Status post total shoulder arthroplasty, left   THERAPY DIAG:  Acute pain of left shoulder  Stiffness of left shoulder, not elsewhere classified  Rationale for Evaluation and Treatment: Rehabilitation  ONSET DATE: 08/28/23  SUBJECTIVE:                                                                                                                                                                                      SUBJECTIVE STATEMENT: Patient reports that she is not hurting any today, but her shoulder is a little sore. She heard a pop in her shoulder the other day, but it hasn't hurt any.  Hand dominance: Left  PERTINENT HISTORY: History of cancer, anxiety, chronic low back pain, and anxiety  PAIN:  Are you having pain? Yes: NPRS scale: 0/10 Pain location: left anterior shoulder to the elbow Pain description: aching and intermittent stinging Aggravating factors: none known Relieving factors: ice and medication  PRECAUTIONS: Shoulder  RED FLAGS: None   WEIGHT BEARING RESTRICTIONS: Yes WB < 5 pounds for 0-12 weeks post op  FALLS:  Has patient fallen in last 6 months? No  LIVING ENVIRONMENT: Lives with: lives with their family Lives in: House/apartment Has following equipment at home:  sling  OCCUPATION: Not working currently; need to be able to lift, push pull, reach overhead up to 40-50 pounds  PLOF: Independent  PATIENT GOALS: be able to use her left arm to clean, lift, and other daily activities  NEXT MD VISIT: 11/21/23  OBJECTIVE:  Note: Objective measures were completed at Evaluation unless otherwise noted.  DIAGNOSTIC FINDINGS: 08/28/23 left shoulder x-ray IMPRESSION: Interval left shoulder arthroplasty without evidence of acute complication.  PATIENT SURVEYS:  FOTO 4.31  COGNITION: Overall cognitive status: Within functional limits for tasks  assessed     SENSATION: Patient reports no numbness or tingling.   UPPER EXTREMITY ROM:   Passive ROM Right  Eval (AROM)  Left eval  Shoulder flexion 137 85  Shoulder extension    Shoulder abduction 142 60  Shoulder adduction  Shoulder internal rotation To T7   Shoulder external rotation To T3 45  Elbow flexion    Elbow extension    Wrist flexion    Wrist extension    Wrist ulnar deviation    Wrist radial deviation    Wrist pronation    Wrist supination    (Blank rows = not tested)  UPPER EXTREMITY MMT: not tested due to surgical condition  PALPATION:  TTP: right deltoid                                                                                                                             TREATMENT DATE:                                    10/24/23 EXERCISE LOG  Exercise Repetitions and Resistance Comments  UBE 10 minutes @ 120 RPM (5 minutes forward/5 minutes backward)   Resisted row  Green t-band x 3 minutes   L shoulder IR  Red t-band x 23 reps    L shoulder ER  Red t-band x 20 reps    Wall ball 1 x 1 minute; 2 x 30 seconds  Circles; limited by fatigue  Therabar bending  Red t-bar x 25 reps each  Up and down   Resisted pull down  Green t-band x 3 x 10 reps LUE only   L shoulder punch out  Yellow t-band x 25 reps   Wall push up  15 reps     Blank cell = exercise not performed today   10/20/23   EXERCISE LOG  Exercise Repetitions and Resistance Comments  UBE 120 RPM 8 mins (4 mins forward/4 backwards)   AA Flexion 2# Cane x 30 reps   AA Chest Press 2# Cane x 30 reps    Ball on Wall 2 mins   Wall Wash    Wall Push Ups 10 reps   Rows Yellow x 30 reps bil   Extension Yellow x 30 reps bil   Bicep Curls 3-way 2# x 20 reps each   Pulleys 6 mins   Ranger Flex/ext; small rainbows x 2.5 mins each   PROM     Blank cell = exercise not performed today   Manual Therapy Soft Tissue Mobilization: left shoulder, STW/M to left deltoid and bicep to decrease pain and  tone   Modalities  Date:  Unattended Estim: Shoulder, IFC 80-150 Hz, 15 mins, Pain and Tone Vaso: Shoulder, 34 degrees; low pressure, 15 mins, Pain and Edema  09/21/23:                                  EXERCISE LOG  Exercise Repetitions and Resistance Comments  Pulley 6 mins        Blank cell = exercise not performed today   Manual  Therapy Soft Tissue Mobilization: left shoulder, STW/M to left deltoid, bicep, and upper trap to decrease pain and tone    Modalities  Date:  Unattended Estim: Shoulder, IFC 80-150 Hz, 15 mins, Pain and Tone Vaso: Shoulder, 34 degrees; low pressure, 15 mins, Pain and Edema   PATIENT EDUCATION: Education details: healing, muscular fatigue, lifting precautions Person educated: Patient Education method: Explanation Education comprehension: verbalized understanding  HOME EXERCISE PROGRAM: [id number]  Rows and extensions  NGJHVMYB on 10/24/23  ASSESSMENT:  CLINICAL IMPRESSION: Patient was progressed with multiple new interventions for improved left shoulder muscular endurance and strength needed to complete her daily activities. She required minimal cueing with the wall ball to limit elbow mobility to isolate deltoid and rotator cuff engagement. Muscular fatigue was her primary limitation with today's interventions as she required rest breaks throughout treatment. She reported that her shoulder was tired upon the conclusion of treatment, but not hurting. She continues to require skilled physical therapy to address her remaining impairments to return to her prior level of function.    OBJECTIVE IMPAIRMENTS: decreased activity tolerance, decreased ROM, decreased strength, hypomobility, increased edema, impaired tone, impaired UE functional use, and pain.   ACTIVITY LIMITATIONS: carrying, lifting, sleeping, bathing, toileting, dressing, reach over head, and hygiene/grooming  PARTICIPATION LIMITATIONS: meal prep, cleaning, laundry, shopping, community  activity, occupation, and yard work  PERSONAL FACTORS: 3+ comorbidities: History of cancer, anxiety, chronic low back pain, and anxiety  are also affecting patient's functional outcome.   REHAB POTENTIAL: Good  CLINICAL DECISION MAKING: Evolving/moderate complexity  EVALUATION COMPLEXITY: Moderate   GOALS: Goals reviewed with patient? Yes  SHORT TERM GOALS: Target date: 10/03/23  Patient will be independent with her initial HEP.  Baseline: Goal status: MET  2.  Patient will be able to demonstrate at least 125 degrees of passive left shoulder flexion for improved shoulder mobility.  Baseline: 1/28: 133 degrees (pulleys) Goal status: MET  3.  Patient will be able to demonstrate at least 110 degrees of passive left shoulder abduction for improved shoulder mobility. Baseline: 1/28: 128 degrees Goal status: MET  LONG TERM GOALS: Target date: 10/31/23  Patient will be independent with her advanced HEP.  Baseline:  Goal status: IN PROGRESS  2.  Patient will be able to demonstrate at least 120 degrees of active shoulder flexion for improved function reaching overhead. Baseline: 2/25: 92 degrees Goal status: IN PROGRESS  3.  Patient will be able to demonstrate at least 120 degrees of active left shoulder abduction for improved function reaching overhead. Baseline: 2/25: 80 degrees Goal status: IN PROGRESS  4.  Patient will be able to carry at least 8 pounds in her left hand for improved function carrying her groceries.  Baseline: 2/25: unable d/t protocol Goal status: IN PROGRESS  5.  Patient will report being able to wash her hair without being limited by her familiar left shoulder symptoms.  Baseline:  Goal status: IN PROGRESS  PLAN:  PT FREQUENCY: 2x/week  PT DURATION: 8 weeks  PLANNED INTERVENTIONS: 97164- PT Re-evaluation, 97110-Therapeutic exercises, 97530- Therapeutic activity, 97112- Neuromuscular re-education, 97535- Self Care, [id number]- Manual therapy, 97014-  Electrical stimulation (unattended), 97016- Vasopneumatic device, Patient/Family education, Joint mobilization, Cryotherapy, and Moist heat  PLAN FOR NEXT SESSION: see protocol for appropriate exercise progression   Granville Lewis, PT 10/24/2023, 9:00 AM

## 2023-10-26 ENCOUNTER — Ambulatory Visit

## 2023-10-26 DIAGNOSIS — M25612 Stiffness of left shoulder, not elsewhere classified: Secondary | ICD-10-CM

## 2023-10-26 DIAGNOSIS — M25512 Pain in left shoulder: Secondary | ICD-10-CM | POA: Diagnosis not present

## 2023-10-26 NOTE — Therapy (Signed)
 OUTPATIENT PHYSICAL THERAPY SHOULDER TREATMENT   Patient Name: [name] MRN: [id number] DOB:1965/08/24, 58 y.o., female Today's Date: 10/26/2023  END OF SESSION:  PT End of Session - 10/26/23 0848     Visit Number 14    Number of Visits 16    Date for PT Re-Evaluation 11/10/23    PT Start Time 0845    PT Stop Time 0930    PT Time Calculation (min) 45 min    Activity Tolerance Patient tolerated treatment well    Behavior During Therapy WFL for tasks assessed/performed               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   Closed fracture of left distal fibula 04/27/2018   Constipation    SOME BLOOD IN STOOL   GERD (gastroesophageal reflux disease)    occasionally-NO MEDS   History of kidney stones    PONV (postoperative nausea and vomiting)    used a scop patch last surgery-was better   Sleep disorder 07/10/2017   SVT (supraventricular tachycardia) (HCC) 09/2022   Past Surgical History:  Procedure Laterality Date   ABDOMINAL HYSTERECTOMY  08/15/1993   partial   BACK SURGERY  08/15/2013   COLON SURGERY     COLONOSCOPY  06/01/2012   Procedure: COLONOSCOPY;  Surgeon: West Bali, MD;  [facility]: [facility];  Service: Endoscopy;  Laterality: N/A;  1:30PM   COLONOSCOPY N/A 06/14/2013   KVQ:[id number] diverticulosis/normal anastomosis   COLONOSCOPY WITH PROPOFOL N/A 02/18/2020   Procedure: COLONOSCOPY WITH PROPOFOL;  Surgeon: Corbin Ade, MD;  [facility]: [facility];  Service: Endoscopy;  Laterality: N/A;  10:00am   endoscopic left fallopian tube removed  several yrs ago   EUS  06/06/2012   Procedure: LOWER ENDOSCOPIC ULTRASOUND (EUS);  Surgeon: Willis Modena, MD;  Location: [facility];  Service: Endoscopy;  Laterality: N/A;   EXAMINATION UNDER ANESTHESIA  09/07/2012   Procedure: EXAM UNDER ANESTHESIA;  Surgeon: Almond Lint, MD;   Location: [facility];  Service: General;  Laterality: N/A;   EXCISION/RELEASE BURSA HIP  09/15/2011   Dr Shelle Iron EXCISION/RELEASE BURSA HIP;  Surgeon: Javier Docker, MD;  Location: [facility];  Service: Orthopedics;  Laterality: Left;  Excision of Trochanteric Bursitis   FLEXIBLE SIGMOIDOSCOPY N/A 03/22/2013   Procedure: FLEXIBLE SIGMOIDOSCOPY;  Surgeon: West Bali, MD;  [facility];  Service: Endoscopy;  Laterality: N/A;  2:00   LAPAROSCOPIC LOW ANTERIOR RESECTION  07/02/2012   Procedure: LAPAROSCOPIC LOW ANTERIOR RESECTION;  Surgeon: Almond Lint, MD;  Location: [facility];  Service: General;  Laterality: N/A;   left ankle surgery for fx  several yrs ago   LUMBAR LAMINECTOMY/DECOMPRESSION MICRODISCECTOMY N/A 05/14/2014   Procedure: LUMBAR DECOMPRESSION L2-L3;  Surgeon: Javier Docker, MD;  Location: [facility];  Service: Orthopedics;  Laterality: N/A;   MASS EXCISION Left 02/02/2018   Procedure: EXCISION CYST, LEFT INNER THIGH;  Surgeon: Franky Macho, MD;  Location: [facility];  Service: General;  Laterality: Left;   PROCTOSCOPY  09/07/2012   Procedure: PROCTOSCOPY;  Surgeon: Almond Lint, MD;  Location: [facility];  Service: General;  Laterality: N/A;   rotator cuff surg Right    TOTAL SHOULDER ARTHROPLASTY Left 08/28/2023   Procedure: LEFT TOTAL SHOULDER ARTHROPLASTY;  Surgeon: Oliver Barre, MD;  Location: [facility];  Service: Orthopedics;  Laterality: Left;  NEEDS RNFA   Patient Active Problem List   Diagnosis Date Noted   Glenohumeral arthritis, left 08/28/2023   BMI 31.0-31.9,adult 02/17/2023   Fever blister 10/05/2020   Chronic low back pain 09/01/2017   Migraine without aura 08/10/2017   Sleep disorder 07/10/2017   GERD (gastroesophageal reflux disease) 05/03/2017   HNP (herniated nucleus pulposus), lumbar 05/14/2014   Internal and external prolapsed hemorrhoids 03/15/2013   Anxiety state 03/15/2013   Rectal cancer, cT2N0, 7 cm from anal verge  06/11/2012   Constipation 05/25/2012   REFERRING PROVIDER: Oliver Barre, MD   REFERRING DIAG: Status post total shoulder arthroplasty, left   THERAPY DIAG:  Acute pain of left shoulder  Stiffness of left shoulder, not elsewhere classified  Rationale for Evaluation and Treatment: Rehabilitation  ONSET DATE: 08/28/23  SUBJECTIVE:                                                                                                                                                                                      SUBJECTIVE STATEMENT: Pt denies any pain today.   Hand dominance: Left  PERTINENT HISTORY: History of cancer, anxiety, chronic low back pain, and anxiety  PAIN:  Are you having pain? Yes: NPRS scale: 0/10 Pain location: left anterior shoulder to the elbow Pain description: aching and intermittent stinging Aggravating factors: none known Relieving factors: ice and medication  PRECAUTIONS: Shoulder  RED FLAGS: None   WEIGHT BEARING RESTRICTIONS: Yes WB < 5 pounds for 0-12 weeks post op  FALLS:  Has patient fallen in last 6 months? No  LIVING ENVIRONMENT: Lives with: lives with their family Lives in: House/apartment Has following equipment at home:  sling  OCCUPATION: Not working currently; need to be able to lift, push pull, reach overhead up to 40-50 pounds  PLOF: Independent  PATIENT GOALS: be able to use her left arm to clean, lift, and other daily activities  NEXT MD VISIT: 11/21/23  OBJECTIVE:  Note: Objective measures were completed at Evaluation unless otherwise noted.  DIAGNOSTIC FINDINGS: 08/28/23 left shoulder x-ray IMPRESSION: Interval left shoulder arthroplasty without evidence of acute complication.  PATIENT SURVEYS:  FOTO 4.31  COGNITION: Overall cognitive status: Within functional limits for tasks assessed     SENSATION: Patient reports no numbness or tingling.   UPPER EXTREMITY ROM:   Passive ROM Right  Eval (AROM)  Left eval   Shoulder flexion 137 85  Shoulder extension    Shoulder abduction 142 60  Shoulder adduction    Shoulder internal rotation To T7   Shoulder external rotation To T3 45  Elbow flexion    Elbow extension  Wrist flexion    Wrist extension    Wrist ulnar deviation    Wrist radial deviation    Wrist pronation    Wrist supination    (Blank rows = not tested)  UPPER EXTREMITY MMT: not tested due to surgical condition  PALPATION:  TTP: right deltoid                                                                                                                             TREATMENT DATE:                                    10/26/23 EXERCISE LOG  Exercise Repetitions and Resistance Comments  UBE 10 minutes @ 120 RPM (5 minutes forward/5 minutes backward)   Pulleys  4 mins   Resisted row  Green t-band x 3 minutes   L shoulder IR  Red t-band x 25 reps    L shoulder ER  Red t-band x 20 reps    Ball on Wall 4 mins Circles; limited by fatigue  Wall Ladder 5 reps (max 31)   Therabar bending  Red t-bar x 25 reps each  Up and down   Resisted pull down  Green t-band x 3 mins LUE only   L shoulder punch out  Yellow t-band x 17 reps   Wall push up  20 reps     Blank cell = exercise not performed today   10/20/23   EXERCISE LOG  Exercise Repetitions and Resistance Comments  UBE 120 RPM 8 mins (4 mins forward/4 backwards)   AA Flexion 2# Cane x 30 reps   AA Chest Press 2# Cane x 30 reps    Ball on Wall 2 mins   Wall Wash    Wall Push Ups 10 reps   Rows Yellow x 30 reps bil   Extension Yellow x 30 reps bil   Bicep Curls 3-way 2# x 20 reps each   Pulleys 6 mins   Ranger Flex/ext; small rainbows x 2.5 mins each   PROM     Blank cell = exercise not performed today   Manual Therapy Soft Tissue Mobilization: left shoulder, STW/M to left deltoid and bicep to decrease pain and tone   Modalities  Date:  Unattended Estim: Shoulder, IFC 80-150 Hz, 15 mins, Pain and Tone Vaso: Shoulder,  34 degrees; low pressure, 15 mins, Pain and Edema  09/21/23:                                  EXERCISE LOG  Exercise Repetitions and Resistance Comments  Pulley 6 mins        Blank cell = exercise not performed today   Manual Therapy Soft Tissue Mobilization: left shoulder, STW/M to left deltoid, bicep, and upper trap to decrease pain and  tone    Modalities  Date:  Unattended Estim: Shoulder, IFC 80-150 Hz, 15 mins, Pain and Tone Vaso: Shoulder, 34 degrees; low pressure, 15 mins, Pain and Edema   PATIENT EDUCATION: Education details: healing, muscular fatigue, lifting precautions Person educated: Patient Education method: Explanation Education comprehension: verbalized understanding  HOME EXERCISE PROGRAM: [id number]  Rows and extensions  NGJHVMYB on 10/24/23  ASSESSMENT:  CLINICAL IMPRESSION: Pt arrives for today's treatment session denying any pain.  Pt able to carry 8 pound weight two laps around the gym without any pain or discomfort.  Pt encouraged to use discretion when carry groceries or other items and to let pain be her guide.  Pt able to tolerate increased reps with various exercises today.  Pt continues to be challenged by resisted punch outs.  Pt reported feeling good at completion of today's treatment session.    OBJECTIVE IMPAIRMENTS: decreased activity tolerance, decreased ROM, decreased strength, hypomobility, increased edema, impaired tone, impaired UE functional use, and pain.   ACTIVITY LIMITATIONS: carrying, lifting, sleeping, bathing, toileting, dressing, reach over head, and hygiene/grooming  PARTICIPATION LIMITATIONS: meal prep, cleaning, laundry, shopping, community activity, occupation, and yard work  PERSONAL FACTORS: 3+ comorbidities: History of cancer, anxiety, chronic low back pain, and anxiety  are also affecting patient's functional outcome.   REHAB POTENTIAL: Good  CLINICAL DECISION MAKING: Evolving/moderate complexity  EVALUATION COMPLEXITY:  Moderate   GOALS: Goals reviewed with patient? Yes  SHORT TERM GOALS: Target date: 10/03/23  Patient will be independent with her initial HEP.  Baseline: Goal status: MET  2.  Patient will be able to demonstrate at least 125 degrees of passive left shoulder flexion for improved shoulder mobility.  Baseline: 1/28: 133 degrees (pulleys) Goal status: MET  3.  Patient will be able to demonstrate at least 110 degrees of passive left shoulder abduction for improved shoulder mobility. Baseline: 1/28: 128 degrees Goal status: MET  LONG TERM GOALS: Target date: 10/31/23  Patient will be independent with her advanced HEP.  Baseline:  Goal status: MET  2.  Patient will be able to demonstrate at least 120 degrees of active shoulder flexion for improved function reaching overhead. Baseline: 2/25: 92 degrees Goal status: IN PROGRESS  3.  Patient will be able to demonstrate at least 120 degrees of active left shoulder abduction for improved function reaching overhead. Baseline: 2/25: 80 degrees Goal status: IN PROGRESS  4.  Patient will be able to carry at least 8 pounds in her left hand for improved function carrying her groceries.  Baseline: 2/25: unable d/t protocol; 3/13:  Goal status: IN PROGRESS  5.  Patient will report being able to wash her hair without being limited by her familiar left shoulder symptoms.  Baseline:  Goal status: MET  PLAN:  PT FREQUENCY: 2x/week  PT DURATION: 8 weeks  PLANNED INTERVENTIONS: 97164- PT Re-evaluation, 97110-Therapeutic exercises, 97530- Therapeutic activity, 97112- Neuromuscular re-education, 97535- Self Care, 16109- Manual therapy, 97014- Electrical stimulation (unattended), 97016- Vasopneumatic device, Patient/Family education, Joint mobilization, Cryotherapy, and Moist heat  PLAN FOR NEXT SESSION: see protocol for appropriate exercise progression   Newman Pies, PTA 10/26/2023, 10:34 AM

## 2023-10-27 ENCOUNTER — Other Ambulatory Visit: Payer: Self-pay | Admitting: Orthopedic Surgery

## 2023-10-27 ENCOUNTER — Other Ambulatory Visit: Payer: Self-pay | Admitting: Nurse Practitioner

## 2023-10-27 DIAGNOSIS — K5900 Constipation, unspecified: Secondary | ICD-10-CM

## 2023-10-30 NOTE — Telephone Encounter (Signed)
 [facility] is historical med-ok to refill?

## 2023-10-31 ENCOUNTER — Ambulatory Visit

## 2023-10-31 DIAGNOSIS — M25512 Pain in left shoulder: Secondary | ICD-10-CM

## 2023-10-31 DIAGNOSIS — M25612 Stiffness of left shoulder, not elsewhere classified: Secondary | ICD-10-CM

## 2023-10-31 NOTE — Therapy (Addendum)
 OUTPATIENT PHYSICAL THERAPY SHOULDER TREATMENT   Patient Name: [name] MRN: [id number] DOB:05/18/1966, 58 y.o., female Today's Date: 10/31/2023  END OF SESSION:  PT End of Session - 10/31/23 0853     Visit Number 15    Number of Visits 16    Date for PT Re-Evaluation 11/10/23    PT Start Time 0845    PT Stop Time 0917    PT Time Calculation (min) 32 min    Activity Tolerance Patient tolerated treatment well    Behavior During Therapy WFL for tasks assessed/performed               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   Closed fracture of left distal fibula 04/27/2018   Constipation    SOME BLOOD IN STOOL   GERD (gastroesophageal reflux disease)    occasionally-NO MEDS   History of kidney stones    PONV (postoperative nausea and vomiting)    used a scop patch last surgery-was better   Sleep disorder 07/10/2017   SVT (supraventricular tachycardia) (HCC) 09/2022   Past Surgical History:  Procedure Laterality Date   ABDOMINAL HYSTERECTOMY  08/15/1993   partial   BACK SURGERY  08/15/2013   COLON SURGERY     COLONOSCOPY  06/01/2012   Procedure: COLONOSCOPY;  Surgeon: West Bali, MD;  Location: [facility];  Service: Endoscopy;  Laterality: N/A;  1:30PM   COLONOSCOPY N/A 06/14/2013   WUJ:WJXB diverticulosis/normal anastomosis   COLONOSCOPY WITH PROPOFOL N/A 02/18/2020   Procedure: COLONOSCOPY WITH PROPOFOL;  Surgeon: Corbin Ade, MD;  Location: [facility];  Service: Endoscopy;  Laterality: N/A;  10:00am   endoscopic left fallopian tube removed  several yrs ago   EUS  06/06/2012   Procedure: LOWER ENDOSCOPIC ULTRASOUND (EUS);  Surgeon: Willis Modena, MD;  Location: [facility];  Service: Endoscopy;  Laterality: N/A;   EXAMINATION UNDER ANESTHESIA  09/07/2012   Procedure: EXAM UNDER ANESTHESIA;  Surgeon: Almond Lint, MD;   Location: [facility];  Service: General;  Laterality: N/A;   EXCISION/RELEASE BURSA HIP  09/15/2011   Dr Shelle Iron EXCISION/RELEASE BURSA HIP;  Surgeon: Javier Docker, MD;  Location: [facility];  Service: Orthopedics;  Laterality: Left;  Excision of Trochanteric Bursitis   FLEXIBLE SIGMOIDOSCOPY N/A 03/22/2013   Procedure: FLEXIBLE SIGMOIDOSCOPY;  Surgeon: West Bali, MD;  Location: [facility];  Service: Endoscopy;  Laterality: N/A;  2:00   LAPAROSCOPIC LOW ANTERIOR RESECTION  07/02/2012   Procedure: LAPAROSCOPIC LOW ANTERIOR RESECTION;  Surgeon: Almond Lint, MD;  Location: [facility];  Service: General;  Laterality: N/A;   left ankle surgery for fx  several yrs ago   LUMBAR LAMINECTOMY/DECOMPRESSION MICRODISCECTOMY N/A 05/14/2014   Procedure: LUMBAR DECOMPRESSION L2-L3;  Surgeon: Javier Docker, MD;  Location: [facility];  Service: Orthopedics;  Laterality: N/A;   MASS EXCISION Left 02/02/2018   Procedure: EXCISION CYST, LEFT INNER THIGH;  Surgeon: Franky Macho, MD;  Location: [facility];  Service: General;  Laterality: Left;   PROCTOSCOPY  09/07/2012   Procedure: PROCTOSCOPY;  Surgeon: Almond Lint, MD;  Location: [facility];  Service: General;  Laterality: N/A;   rotator cuff surg Right    TOTAL SHOULDER ARTHROPLASTY Left 08/28/2023   Procedure: LEFT TOTAL SHOULDER ARTHROPLASTY;  Surgeon: Oliver Barre, MD;  Location: [facility];  Service: Orthopedics;  Laterality: Left;  NEEDS RNFA   Patient Active Problem List   Diagnosis Date Noted   Glenohumeral arthritis, left 08/28/2023   BMI 31.0-31.9,adult 02/17/2023   Fever blister 10/05/2020   Chronic low back pain 09/01/2017   Migraine without aura 08/10/2017   Sleep disorder 07/10/2017   GERD (gastroesophageal reflux disease) 05/03/2017   HNP (herniated nucleus pulposus), lumbar 05/14/2014   Internal and external prolapsed hemorrhoids 03/15/2013   Anxiety state 03/15/2013   Rectal cancer, cT2N0, 7 cm from anal verge  06/11/2012   Constipation 05/25/2012   REFERRING PROVIDER: Oliver Barre, MD   REFERRING DIAG: Status post total shoulder arthroplasty, left   THERAPY DIAG:  Acute pain of left shoulder  Stiffness of left shoulder, not elsewhere classified  Rationale for Evaluation and Treatment: Rehabilitation  ONSET DATE: 08/28/23  SUBJECTIVE:                                                                                                                                                                                      SUBJECTIVE STATEMENT: Pt reports 1/10 left shoulder pain.  Pt ready for discharge today.   Hand dominance: Left  PERTINENT HISTORY: History of cancer, anxiety, chronic low back pain, and anxiety  PAIN:  Are you having pain? Yes: NPRS scale: 1/10 Pain location: left anterior shoulder to the elbow Pain description: aching and intermittent stinging Aggravating factors: none known Relieving factors: ice and medication  PRECAUTIONS: Shoulder  RED FLAGS: None   WEIGHT BEARING RESTRICTIONS: Yes WB < 5 pounds for 0-12 weeks post op  FALLS:  Has patient fallen in last 6 months? No  LIVING ENVIRONMENT: Lives with: lives with their family Lives in: House/apartment Has following equipment at home:  sling  OCCUPATION: Not working currently; need to be able to lift, push pull, reach overhead up to 40-50 pounds  PLOF: Independent  PATIENT GOALS: be able to use her left arm to clean, lift, and other daily activities  NEXT MD VISIT: 11/21/23  OBJECTIVE:  Note: Objective measures were completed at Evaluation unless otherwise noted.  DIAGNOSTIC FINDINGS: 08/28/23 left shoulder x-ray IMPRESSION: Interval left shoulder arthroplasty without evidence of acute complication.  PATIENT SURVEYS:  FOTO 4.31  COGNITION: Overall cognitive status: Within functional limits for tasks assessed     SENSATION: Patient reports no numbness or tingling.   UPPER EXTREMITY ROM:    Passive ROM Right  Eval (AROM)  Left eval  Shoulder flexion 137 85  Shoulder extension    Shoulder abduction 142 60  Shoulder adduction    Shoulder internal rotation To T7   Shoulder external rotation To T3 45  Elbow  flexion    Elbow extension    Wrist flexion    Wrist extension    Wrist ulnar deviation    Wrist radial deviation    Wrist pronation    Wrist supination    (Blank rows = not tested)  UPPER EXTREMITY MMT: not tested due to surgical condition  PALPATION:  TTP: right deltoid                                                                                                                             TREATMENT DATE:                                    10/31/23 EXERCISE LOG  Exercise Repetitions and Resistance Comments  UBE 10 minutes @ 120 RPM (5 minutes forward/5 minutes backward)   Pulleys  5 mins   Resisted row  Green t-band x 30 reps   L shoulder IR  Green t-band x 30 reps    L shoulder ER  Green t-band x 30 reps    Ball on [employer] 4 mins Circles; limited by fatigue  Wall Ladder    Therabar bending   Up and down   Resisted pull down  Green t-band x 30 reps LUE only   L shoulder punch out     Wall push up      Blank cell = exercise not performed today   10/20/23   EXERCISE LOG  Exercise Repetitions and Resistance Comments  UBE 120 RPM 8 mins (4 mins forward/4 backwards)   AA Flexion 2# Cane x 30 reps   AA Chest Press 2# Cane x 30 reps    Ball on Wall 2 mins   Wall Wash    Wall Push Ups 10 reps   Rows Yellow x 30 reps bil   Extension Yellow x 30 reps bil   Bicep Curls 3-way 2# x 20 reps each   Pulleys 6 mins   Ranger Flex/ext; small rainbows x 2.5 mins each   PROM     Blank cell = exercise not performed today   Manual Therapy Soft Tissue Mobilization: left shoulder, STW/M to left deltoid and bicep to decrease pain and tone   Modalities  Date:  Unattended Estim: Shoulder, IFC 80-150 Hz, 15 mins, Pain and Tone Vaso: Shoulder, 34 degrees; low  pressure, 15 mins, Pain and Edema  09/21/23:                                  EXERCISE LOG  Exercise Repetitions and Resistance Comments  [place]ley 6 mins        Blank cell = exercise not performed today   Manual Therapy Soft Tissue Mobilization: left shoulder, STW/M to left deltoid, bicep, and upper trap to decrease pain and tone    Modalities  Date:  Unattended Estim: Shoulder, IFC 80-150 Hz, 15 mins, Pain and Tone Vaso: Shoulder, 34 degrees; low pressure, 15 mins, Pain and Edema   PATIENT EDUCATION: Education details: healing, muscular fatigue, lifting precautions Person educated: Patient Education method: Explanation Education comprehension: verbalized understanding  HOME EXERCISE PROGRAM: [id number]  Rows and extensions  [id number] on 10/24/23  ASSESSMENT:  CLINICAL IMPRESSION: Pt arrives for today's treatment session reporting 1/10 left shoulder pain.  Pt able to demonstrate 134 degrees of active left shoulder flexion and 132 degrees of active left shoulder abduction today, meeting both of her ROM goals.  Pt also able to carry 8 pound weight around the gym numerous times without pain, meeting that LTG as well.  Pt able to tolerate increased resistance with all t-band exercises performed today.  Pt encouraged to call the facility with any questions or concerns.  Pt has met all of the goals set forth for her by physical therapy at this time.  Pt denied any pain at completion of today's treatment session.  Pt ready for discharge at this time.    PHYSICAL THERAPY DISCHARGE SUMMARY  Visits from Start of Care: 15  Current functional level related to goals / functional outcomes: Patient was able to meet all of her goals and she felt comfortable being discharged at this time.    Remaining deficits: None    Education / Equipment: HEP    Patient agrees to discharge. Patient goals were met. Patient is being discharged due to meeting the stated rehab goals.  Candi Leash, PT, DPT     OBJECTIVE IMPAIRMENTS: decreased activity tolerance, decreased ROM, decreased strength, hypomobility, increased edema, impaired tone, impaired UE functional use, and pain.   ACTIVITY LIMITATIONS: carrying, lifting, sleeping, bathing, toileting, dressing, reach over head, and hygiene/grooming  PARTICIPATION LIMITATIONS: meal prep, cleaning, laundry, shopping, community activity, occupation, and yard work  PERSONAL FACTORS: 3+ comorbidities: History of cancer, anxiety, chronic low back pain, and anxiety  are also affecting patient's functional outcome.   REHAB POTENTIAL: Good  CLINICAL DECISION MAKING: Evolving/moderate complexity  EVALUATION COMPLEXITY: Moderate   GOALS: Goals reviewed with patient? Yes  SHORT TERM GOALS: Target date: 10/03/23  Patient will be independent with her initial HEP.  Baseline: Goal status: MET  2.  Patient will be able to demonstrate at least 125 degrees of passive left shoulder flexion for improved shoulder mobility.  Baseline: 1/28: 133 degrees (pulleys) Goal status: MET  3.  Patient will be able to demonstrate at least 110 degrees of passive left shoulder abduction for improved shoulder mobility. Baseline: 1/28: 128 degrees Goal status: MET  LONG TERM GOALS: Target date: 10/31/23  Patient will be independent with her advanced HEP.  Baseline:  Goal status: MET  2.  Patient will be able to demonstrate at least 120 degrees of active shoulder flexion for improved function reaching overhead. Baseline: 2/25: 92 degrees; 3/18: 134 degrees Goal status: MET  3.  Patient will be able to demonstrate at least 120 degrees of active left shoulder abduction for improved function reaching overhead. Baseline: 2/25: 80 degrees; 3/18: 132 degrees Goal status: MET  4.  Patient will be able to carry at least 8 pounds in her left hand for improved function carrying her groceries.  Baseline: 2/25: unable d/t protocol; 3/18: 8 pound weight  Goal status:  MET  5.  Patient will report being able to wash her hair without being limited by her familiar left shoulder symptoms.  Baseline:  Goal status: MET  PLAN:  PT FREQUENCY: 2x/week  PT DURATION: 8 weeks  PLANNED INTERVENTIONS: 97164- PT Re-evaluation, 97110-Therapeutic exercises, 97530- Therapeutic activity, 97112- Neuromuscular re-education, 97535- Self Care, 40981- Manual therapy, 97014- Electrical stimulation (unattended), 97016- Vasopneumatic device, Patient/Family education, Joint mobilization, Cryotherapy, and Moist heat  PLAN FOR NEXT SESSION: see protocol for appropriate exercise progression   Newman Pies, PTA 10/31/2023, 9:27 AM

## 2023-11-14 ENCOUNTER — Other Ambulatory Visit: Payer: Self-pay | Admitting: Orthopedic Surgery

## 2023-11-21 ENCOUNTER — Ambulatory Visit: Payer: 59 | Admitting: Orthopedic Surgery

## 2023-11-21 ENCOUNTER — Encounter: Payer: Self-pay | Admitting: Orthopedic Surgery

## 2023-11-21 ENCOUNTER — Other Ambulatory Visit (INDEPENDENT_AMBULATORY_CARE_PROVIDER_SITE_OTHER): Payer: Self-pay

## 2023-11-21 DIAGNOSIS — Z96612 Presence of left artificial shoulder joint: Secondary | ICD-10-CM

## 2023-11-21 DIAGNOSIS — M19012 Primary osteoarthritis, left shoulder: Secondary | ICD-10-CM

## 2023-11-21 NOTE — Patient Instructions (Signed)
 Please provide a note for work - she is to be excused from work from 4/7-4/9.  OK to return to work starting 4/10

## 2023-11-21 NOTE — Progress Notes (Signed)
 Orthopaedic Postop Note  Assessment: [name] is a 58 y.o. female s/p Left Anatomic Total Shoulder Arthroplasty  DOS: 08/28/2023  Plan: [name] has done well following surgery.  She feels ready to return to work.  She has some assistance at work and they can help with heavier lifting.  Radiographs remain stable.  Encouraged her to continue working on her exercises.  Provided a note for work.  I will see her back in 3 months for repeat evaluation.   Follow-up: Return in about 3 months (around 02/20/2024).  XR at next visit: Left shoulder  Subjective:  Chief Complaint  Patient presents with   Follow-up    Recheck on left shoulder, DOS 08-28-23.    History of Present Illness: [name] is a 58 y.o. female who presents following the above stated procedure.  Surgery was approximately 3 months ago.  He has been discharged from therapy.  She is doing exercises on her own.  She does continue to have some sensitivity around the incision.  She also has some peri-incisional numbness.  Occasional aches and pains, which are often associated with increased activity.  She is ready to return to work.   Review of Systems: No fevers or chills No numbness or tingling No Chest Pain No shortness of breath   Objective: There were no vitals taken for this visit.  Physical Exam:  Alert and oriented, no acute distress  Surgical incision is healed.  No surrounding erythema or drainage.  Some increase sensitivity in line with the incision.  Some peri-incisional numbness.  160 degrees of forward flexion.  90 degrees of abduction.  External rotation is similar to contralateral side.  Fingers are warm and well-perfused.  IMAGING: I personally ordered and reviewed the following images:  X-rays left shoulder obtained in clinic today.  These compared prior x-rays.  Stable alignment of left shoulder arthroplasty.  No subsidence.  No fractures.  No dislocation.  No bony  lesions.  Impression: Stable left shoulder arthroplasty  Oliver Barre, MD 11/21/2023 9:46 AM

## 2023-12-09 ENCOUNTER — Other Ambulatory Visit: Payer: Self-pay | Admitting: Nurse Practitioner

## 2023-12-09 DIAGNOSIS — K219 Gastro-esophageal reflux disease without esophagitis: Secondary | ICD-10-CM

## 2024-01-04 ENCOUNTER — Other Ambulatory Visit: Payer: Self-pay | Admitting: Nurse Practitioner

## 2024-01-04 DIAGNOSIS — K219 Gastro-esophageal reflux disease without esophagitis: Secondary | ICD-10-CM

## 2024-02-04 ENCOUNTER — Other Ambulatory Visit: Payer: Self-pay | Admitting: Nurse Practitioner

## 2024-02-10 ENCOUNTER — Other Ambulatory Visit: Payer: Self-pay | Admitting: Nurse Practitioner

## 2024-02-13 ENCOUNTER — Encounter: Payer: Self-pay | Admitting: Orthopedic Surgery

## 2024-02-13 ENCOUNTER — Other Ambulatory Visit (INDEPENDENT_AMBULATORY_CARE_PROVIDER_SITE_OTHER)

## 2024-02-13 ENCOUNTER — Ambulatory Visit: Admitting: Orthopedic Surgery

## 2024-02-13 DIAGNOSIS — Z96612 Presence of left artificial shoulder joint: Secondary | ICD-10-CM

## 2024-02-13 NOTE — Progress Notes (Signed)
 Orthopaedic Postop Note  Assessment: [name] is a 58 y.o. female s/p Left Anatomic Total Shoulder Arthroplasty  DOS: 08/28/2023  Plan: Mrs [name] is happy with her improvements.  Her pain is much better.  She is lacking some motion, but is motivated to continue to work and improve her function.  I provided reassurance.  Anticipate improvements for up to a year following surgery.  She has no restrictions at this point.  Continue to work with therapy and exercises.  Follow-up in 6 months.   Follow-up: Return in about 6 months (around 08/15/2024).  XR at next visit: Left shoulder  Subjective:  Chief Complaint  Patient presents with   Routine Post Op    L TSA DOS 08/28/23    History of Present Illness: [name] is a 58 y.o. female who presents following the above stated procedure.  Surgery was approximately 6 months ago.  She is doing exercises on her own.  She states she does not do therapy exercises on a daily basis, but uses both her arms at work, and feels that this is similar to therapy.  She is a little bit frustrated with some motion deficits, and has difficulty getting her hand behind her back.  Otherwise, she is very pleased with her improvements.  Her pain is much better.  Issues with her left elbow are also better.  Review of Systems: No fevers or chills No numbness or tingling No Chest Pain No shortness of breath   Objective: There were no vitals taken for this visit.  Physical Exam:  Alert and oriented, no acute distress  Surgical incision is healed.  No surrounding erythema or drainage.  160 degrees of forward flexion.  90 degrees of abduction.  External rotation is similar to contralateral side.  Internal rotation to lumbar spine, but not as far as the contralateral hand.  She is able to get her left hand to her right shoulder, but cannot scratch the right posterior shoulder.  Fingers are warm and well-perfused.  IMAGING: I personally  ordered and reviewed the following images:  X-rays of the left shoulder were obtained in clinic today.  These are compared to available x-rays.  Left shoulder arthroplasty remains in stable alignment.  There has been no change in overall appearance.  No subsidence.  No acute injuries.  No evidence of proximal humeral migration.  No bony lesions.  Impression: Stable left shoulder arthroplasty without subsidence   [name], MD 02/13/2024 10:01 AM

## 2024-03-05 LAB — HM MAMMOGRAPHY

## 2024-03-07 ENCOUNTER — Encounter: Payer: Self-pay | Admitting: Nurse Practitioner

## 2024-03-22 ENCOUNTER — Other Ambulatory Visit: Payer: Self-pay | Admitting: Nurse Practitioner

## 2024-03-22 DIAGNOSIS — K219 Gastro-esophageal reflux disease without esophagitis: Secondary | ICD-10-CM

## 2024-04-19 ENCOUNTER — Ambulatory Visit: Admitting: Nurse Practitioner

## 2024-04-23 ENCOUNTER — Encounter: Payer: Self-pay | Admitting: Nurse Practitioner

## 2024-04-23 ENCOUNTER — Other Ambulatory Visit: Payer: Self-pay | Admitting: Nurse Practitioner

## 2024-04-23 ENCOUNTER — Ambulatory Visit: Admitting: Nurse Practitioner

## 2024-04-23 VITALS — BP 114/73 | HR 78 | Temp 97.3°F | Ht 66.0 in | Wt 193.4 lb

## 2024-04-23 DIAGNOSIS — Z23 Encounter for immunization: Secondary | ICD-10-CM

## 2024-04-23 DIAGNOSIS — K219 Gastro-esophageal reflux disease without esophagitis: Secondary | ICD-10-CM

## 2024-04-23 DIAGNOSIS — Z6831 Body mass index (BMI) 31.0-31.9, adult: Secondary | ICD-10-CM

## 2024-04-23 DIAGNOSIS — F411 Generalized anxiety disorder: Secondary | ICD-10-CM | POA: Diagnosis not present

## 2024-04-23 DIAGNOSIS — K5900 Constipation, unspecified: Secondary | ICD-10-CM | POA: Diagnosis not present

## 2024-04-23 MED ORDER — WEGOVY 2.4 MG/0.75ML ~~LOC~~ SOAJ: 2.4000 mg | SUBCUTANEOUS | 2 refills | Status: DC

## 2024-04-23 MED ORDER — LINACLOTIDE 290 MCG PO CAPS: 290.0000 ug | ORAL_CAPSULE | Freq: Every day | ORAL | 3 refills | Status: AC

## 2024-04-23 MED ORDER — ESOMEPRAZOLE MAGNESIUM 40 MG PO CPDR: 40.0000 mg | DELAYED_RELEASE_CAPSULE | Freq: Two times a day (BID) | ORAL | 1 refills | Status: AC

## 2024-04-23 MED ORDER — ESCITALOPRAM OXALATE 10 MG PO TABS: 10.0000 mg | ORAL_TABLET | Freq: Every day | ORAL | 0 refills | Status: DC

## 2024-04-23 NOTE — Addendum Note (Signed)
 Addended by: GLADIS MUSTARD on: 04/23/2024 03:42 PM   Modules accepted: Level of Service

## 2024-04-23 NOTE — Addendum Note (Signed)
 Addended by: Shamia Uppal G on: 04/23/2024 04:39 PM   Modules accepted: Orders

## 2024-04-23 NOTE — Patient Instructions (Signed)

## 2024-04-23 NOTE — Progress Notes (Signed)
 Subjective:    Patient ID: [name], female    DOB: 05-Sep-1965, 58 y.o.   MRN: [id number]   Chief Complaint: medical management of chronic issues     HPI:  [name] is a 58 y.o. who identifies as a female who was assigned female at birth.   Social history: Lives with: husband Work history: disability   Comes in today for follow up of the following chronic medical issues:  1. Gastroesophageal reflux disease, unspecified whether esophagitis present Is on nexium  as needed.  2. Migraine without aura and without status migrainosus, not intractable Haven't not had any in over 6 months.  3. Rectal cancer, cT2N0, 7 cm from anal verge Has been cleared from oncology. Has ben put on aving colonoscopy every 3 years.  4. Anxiety state Is on buspar  as needed. She very seldom takes. Has not had filled since 08/2021. She is on lexapro  and is doing well.    04/23/2024    2:38 PM 04/19/2023    2:49 PM 02/17/2023    3:17 PM 10/03/2022    3:44 PM  GAD 7 : Generalized Anxiety Score  Nervous, Anxious, on Edge 0 0 0 1  Control/stop worrying 0 0 0 0  Worry too much - different things 0 0 0 0  Trouble relaxing 0 0 0 1  Restless 0 0 0 0  Easily annoyed or irritable 0 0 0 0  Afraid - awful might happen 0 0 0 0  Total GAD 7 Score 0 0 0 2  Anxiety Difficulty Not difficult at all Not difficult at all Not difficult at all Not difficult at all    5. Chronic bilateral low back pain with right-sided sciatica Has chronic back pain. Uses flexeril  as needed  6. Constipation She Is on linzess  and is doing well.  7. Sleep disorder Sleeps about 5-6 hours a night and that is enough fro her.  8. BMI 31.0-31.9 Is on wegovy  2.4mg  weekly , but does not seem to be helping. Wt Readings from Last 3 Encounters:  04/23/24 193 lb 6.4 oz (87.7 kg)  10/19/23 187 lb (84.8 kg)  08/21/23 186 lb 11.7 oz (84.7 kg)   BMI Readings from Last 3 Encounters:  04/23/24 31.22 kg/m  10/19/23  30.18 kg/m  08/28/23 30.14 kg/m    New complaints: None today  Allergies  Allergen Reactions   Codeine Nausea Only    Confirmed intolerance of codeine verbally with pt in PACU, pt states does not cause any SOB or dyspnea, some nausea only (MN-RN)   Ultram [Tramadol] Nausea And Vomiting    Ultram allergy also discussed, active N&V occurs with Ultram   Hydrocodone  Hives   Latex Itching and Rash   Outpatient Encounter Medications as of 04/23/2024  Medication Sig   cyclobenzaprine  (FLEXERIL ) 10 MG tablet Take 10 mg by mouth 3 (three) times daily as needed for muscle spasms.   escitalopram  (LEXAPRO ) 10 MG tablet Take 1 tablet (10 mg total) by mouth daily.   esomeprazole  (NEXIUM ) 40 MG capsule TAKE 1 CAPSULE (40 MG TOTAL) BY MOUTH 2 (TWO) TIMES DAILY BEFORE A MEAL.   estradiol  (ESTRACE ) 1 MG tablet TAKE 1 AND 1/2 TABLETS DAILY BY MOUTH   gabapentin  (NEURONTIN ) 100 MG capsule Take 1 capsule (100 mg total) by mouth at bedtime.   LINZESS  290 MCG CAPS capsule TAKE 1 CAPSULE BY MOUTH EVERY DAY BEFORE BREAKFAST   Semaglutide -Weight Management (WEGOVY ) 2.4 MG/0.75ML SOAJ INJECT 2.4 MG INTO THE SKIN  ONCE A WEEK.   [DISCONTINUED] diltiazem  (CARDIZEM ) 30 MG tablet Take 30 mg by mouth 4 (four) times daily as needed (tachycardia). (Patient not taking: Reported on 04/23/2024)   No facility-administered encounter medications on file as of 04/23/2024.    Past Surgical History:  Procedure Laterality Date   ABDOMINAL HYSTERECTOMY  08/15/1993   partial   BACK SURGERY  08/15/2013   COLON SURGERY     COLONOSCOPY  06/01/2012   Procedure: COLONOSCOPY;  Surgeon: Margo LITTIE Haddock, MD;  [facility];  Service: Endoscopy;  Laterality: N/A;  1:30PM   COLONOSCOPY N/A 06/14/2013   DOQ:fpoi diverticulosis/normal anastomosis   COLONOSCOPY WITH PROPOFOL  N/A 02/18/2020   Procedure: COLONOSCOPY WITH PROPOFOL ;  Surgeon: Shaaron Lamar HERO, MD;  [facility]: [facility];  Service: Endoscopy;  Laterality: N/A;   10:00am   endoscopic left fallopian tube removed  several yrs ago   EUS  06/06/2012   Procedure: LOWER ENDOSCOPIC ULTRASOUND (EUS);  Surgeon: Elsie Cree, MD;  Location: [facility];  Service: Endoscopy;  Laterality: N/A;   EXAMINATION UNDER ANESTHESIA  09/07/2012   Procedure: EXAM UNDER ANESTHESIA;  Surgeon: Jina Nephew, MD;  Location: [facility];  Service: General;  Laterality: N/A;   EXCISION/RELEASE BURSA HIP  09/15/2011   Dr Duwayne EXCISION/RELEASE BURSA HIP;  Surgeon: Reyes JAYSON Duwayne, MD;  Location: [facility];  Service: Orthopedics;  Laterality: Left;  Excision of Trochanteric Bursitis   FLEXIBLE SIGMOIDOSCOPY N/A 03/22/2013   Procedure: FLEXIBLE SIGMOIDOSCOPY;  Surgeon: Margo LITTIE Haddock, MD;  Location: [facility];  Service: Endoscopy;  Laterality: N/A;  2:00   LAPAROSCOPIC LOW ANTERIOR RESECTION  07/02/2012   Procedure: LAPAROSCOPIC LOW ANTERIOR RESECTION;  Surgeon: Jina Nephew, MD;  Location: [facility];  Service: General;  Laterality: N/A;   left ankle surgery for fx  several yrs ago   LUMBAR LAMINECTOMY/DECOMPRESSION MICRODISCECTOMY N/A 05/14/2014   Procedure: LUMBAR DECOMPRESSION L2-L3;  Surgeon: Reyes JAYSON Duwayne, MD;  Location: [facility];  Service: Orthopedics;  Laterality: N/A;   MASS EXCISION Left 02/02/2018   Procedure: EXCISION CYST, LEFT INNER THIGH;  Surgeon: Mavis Anes, MD;  Location: [facility];  Service: General;  Laterality: Left;   PROCTOSCOPY  09/07/2012   Procedure: PROCTOSCOPY;  Surgeon: Jina Nephew, MD;  Location: [facility];  Service: General;  Laterality: N/A;   rotator cuff surg Right    TOTAL SHOULDER ARTHROPLASTY Left 08/28/2023   Procedure: LEFT TOTAL SHOULDER ARTHROPLASTY;  Surgeon: Onesimo Anes LABOR, MD;  Location: [facility];  Service: Orthopedics;  Laterality: Left;  NEEDS RNFA    Family History  Problem Relation Age of Onset   Other Sister    Migraines Mother    Colon cancer Paternal Aunt       Controlled substance contract:  n/a     Review of Systems  Constitutional:  Negative for diaphoresis.  Eyes:  Negative for pain.  Respiratory:  Negative for shortness of breath.   Cardiovascular:  Negative for chest pain, palpitations and leg swelling.  Gastrointestinal:  Negative for abdominal pain.  Endocrine: Negative for polydipsia.  Skin:  Negative for rash.  Neurological:  Negative for dizziness, weakness and headaches.  Hematological:  Does not bruise/bleed easily.  All other systems reviewed and are negative.      Objective:   Physical Exam Vitals and nursing note reviewed.  Constitutional:      General: She is not in acute distress.    Appearance: Normal appearance. She is well-developed.  HENT:  Head: Normocephalic.     Right Ear: Tympanic membrane normal.     Left Ear: Tympanic membrane normal.     Nose: Nose normal.     Mouth/Throat:     Mouth: Mucous membranes are moist.  Eyes:     Pupils: Pupils are equal, round, and reactive to light.  Neck:     Vascular: No carotid bruit or JVD.  Cardiovascular:     Rate and Rhythm: Normal rate and regular rhythm.     Heart sounds: Normal heart sounds.  Pulmonary:     Effort: Pulmonary effort is normal. No respiratory distress.     Breath sounds: Normal breath sounds. No wheezing or rales.  Chest:     Chest wall: No tenderness.  Abdominal:     General: Bowel sounds are normal. There is no distension or abdominal bruit.     Palpations: Abdomen is soft. There is no hepatomegaly, splenomegaly, mass or pulsatile mass.     Tenderness: There is no abdominal tenderness.  Musculoskeletal:        General: Normal range of motion.     Cervical back: Normal range of motion and neck supple.  Lymphadenopathy:     Cervical: No cervical adenopathy.  Skin:    General: Skin is warm and dry.  Neurological:     Mental Status: She is alert and oriented to person, place, and time.     Deep Tendon Reflexes: Reflexes are normal and symmetric.  Psychiatric:         Behavior: Behavior normal.        Thought Content: Thought content normal.        Judgment: Judgment normal.    BP 114/73   Pulse 78   Temp (!) 97.3 F (36.3 C)   Ht 5' 6 (1.676 m)   Wt 193 lb 6.4 oz (87.7 kg)   SpO2 99%   BMI 31.22 kg/m         Assessment & Plan:   Sakara Lehtinen comes in today with chief complaint of Medical Management of Chronic Issues   Diagnosis and orders addressed:  1. Gastroesophageal reflux disease, unspecified whether esophagitis present Avoid spicy foods Do not eat 2 hours prior to bedtime - esomeprazole  (NEXIUM ) 40 MG capsule; Take 1 capsule (40 mg total) by mouth daily before breakfast. (Needs to be seen before next refill)  Dispense: 90 capsule; Refill: 3 - CBC with Differential/Platelet - CMP14+EGFR - Lipid panel  2. Migraine without aura and without status migrainosus, not intractable Report any return of migraines  3. Rectal cancer, cT2N0, 7 cm from anal verge Keep follow up with  4. Anxiety state Stress management  5. Chronic bilateral low back pain with right-sided sciatica Back stretches  6. Sleep disorder Bedtime routine  7. Constipation, unspecified constipation type Increase fiber in diet - linaclotide  (LINZESS ) 290 MCG CAPS capsule; TAKE 1 CAPSULE BY MOUTH DAILY BEFORE BREAKFAST  Dispense: 90 capsule; Refill: 3  8. Panic attacks - escitalopram  (LEXAPRO ) 10 MG tablet; Take 1 tablet (10 mg total) by mouth daily. (NEEDS TO BE SEEN BEFORE NEXT REFILL)  Dispense: 30 tablet; Refill: 0   Labs pending Health Maintenance reviewed Diet and exercise encouraged  Follow up plan: 6 months   Mary-Margaret Gladis, FNP

## 2024-05-04 ENCOUNTER — Other Ambulatory Visit: Payer: Self-pay | Admitting: Nurse Practitioner

## 2024-05-11 ENCOUNTER — Other Ambulatory Visit: Payer: Self-pay | Admitting: Nurse Practitioner

## 2024-05-11 DIAGNOSIS — F411 Generalized anxiety disorder: Secondary | ICD-10-CM

## 2024-05-24 ENCOUNTER — Other Ambulatory Visit: Payer: Self-pay | Admitting: Nurse Practitioner

## 2024-05-24 DIAGNOSIS — B001 Herpesviral vesicular dermatitis: Secondary | ICD-10-CM

## 2024-05-27 ENCOUNTER — Other Ambulatory Visit: Payer: Self-pay

## 2024-05-27 ENCOUNTER — Ambulatory Visit: Payer: Self-pay

## 2024-05-27 DIAGNOSIS — N390 Urinary tract infection, site not specified: Secondary | ICD-10-CM

## 2024-05-27 NOTE — Telephone Encounter (Signed)
 FYI Only or Action Required?: FYI only for provider.  Patient was last seen in primary care on 04/23/2024 by Lisa Mustard, FNP.  Called Nurse Triage reporting Mouth Lesions.  Symptoms began several days ago.  Interventions attempted: Other: requested refill.  Symptoms are: unchanged.  Triage Disposition: See Physician Within 24 Hours  Patient/caregiver understands and will follow disposition?: Yes    Reason for Disposition  [1] Spreading redness around cold sore AND [2] no fever  Answer Assessment - Initial Assessment Questions 1. APPEARANCE: What does the cold sore look like? (e.g., small bumps or blisters on the outer lip)     Lip outside  2. SIZE: How large an area is involved with the cold sores? (e.g., inches, cm or compare to coins)      3. LOCATION: Which part of the lip is involved?     Outer  4. ONSET: When did the cold sore begin?     Few days  5. RECURRENT BLISTERS: Have you had cold sores before? If Yes, ask: When was the last time? How many times a year?     yes 6. TREATMENT: Are you using any medicines for cold sores? (e.g., over-the-counter cream, antiviral pills)     valtrex  7. OTHER SYMPTOMS: Do you have any other symptoms? (e.g., fever, sores inside mouth)     Denies  8. PREGNANCY: Is there any chance you are pregnant? When was your last menstrual period?  Protocols used: Cold Sores (Fever Blisters)-A-AH

## 2024-05-27 NOTE — Telephone Encounter (Signed)
 Caller answered and then hung up when asked identifying questions. Placing in call back for further attempt.   Copied from CRM [id number]. Topic: Clinical - Medication Question >> May 27, 2024  3:59 PM Willma R wrote: Reason for CRM: Patient states she has a fever blister but doesn't have time to come in and be seen. Her work is very busy and she cannot take any time off. Requested valACYclovir  (VALTREX ) 1000 MG tablet to be refilled and was denied. Patient is requesting if it can be filled without her being seen.  Patient can be reached at [phone] (home)

## 2024-05-28 ENCOUNTER — Ambulatory Visit: Admitting: Nurse Practitioner

## 2024-05-28 ENCOUNTER — Other Ambulatory Visit: Payer: Self-pay | Admitting: Nurse Practitioner

## 2024-05-28 MED ORDER — VALACYCLOVIR HCL 1 G PO TABS
ORAL_TABLET | ORAL | 1 refills | Status: AC
Start: 2024-05-28 — End: ?

## 2024-05-28 NOTE — Telephone Encounter (Signed)
 Pt has appt

## 2024-05-28 NOTE — Progress Notes (Signed)
 Patient  has frequent fever blisters. Needs refill on valtrex . Was just seen in office on 04/23/24.  Meds ordered this encounter  Medications   valACYclovir  (VALTREX ) 1000 MG tablet    Sig: 2 po in am and 2 po pm for one day at fever blister onset.    Dispense:  12 tablet    Refill:  1    Supervising Provider:   MARYANNE CHEW A [id number]   Mary-Margaret Gladis, FNP

## 2024-05-31 ENCOUNTER — Other Ambulatory Visit (HOSPITAL_COMMUNITY): Payer: Self-pay

## 2024-07-14 NOTE — Progress Notes (Incomplete)
 Cardiology Office Note:   Date:  07/14/2024  ID:  [name], DOB Dec 31, 1965, MRN [id number] PCP: Gladis Mustard, FNP  [facility] Providers Cardiologist:  Georganna Archer { Chief Complaint:  Chief Complaint  Patient presents with   Tachycardia      History of Present Illness:   [name] is a 58 y.o. female with a PMH of SVT and rectal cancer s/p resection who presents for follow up.  Patient reports that she has been having more frequent episodes of SVT over the past few months.  Since June, she has had 4 episodes of SVT.  She has as needed diltiazem  which she uses and does help; however, her palpitations will typically last several minutes before subsiding.  During the episodes she experiences lightheadedness, blurry vision, chest pressure, palpitations, and coughing.  The symptoms occur suddenly and resolve suddenly.  She has tried vagal maneuvers in the past without much success.  No other changes going on in her life.  She walks frequently at her job and gets no chest pain, SOB, PND, orthopnea, swelling or other symptoms with exertion.  No further complaints.   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   Closed fracture of left distal fibula 04/27/2018   Constipation    SOME BLOOD IN STOOL   GERD (gastroesophageal reflux disease)    occasionally-NO MEDS   History of kidney stones    PONV (postoperative nausea and vomiting)    used a scop patch last surgery-was better   Sleep disorder 07/10/2017   SVT (supraventricular tachycardia) 09/2022     Studies Reviewed:    EKG: No new ECG.  Most recent ECG without preexcitation       Cardiac Studies & Procedures   ______________________________________________________________________________________________     ECHOCARDIOGRAM  ECHOCARDIOGRAM COMPLETE  12/26/2018  Narrative ECHOCARDIOGRAM REPORT    Patient Name:   [name] Date of Exam: 12/26/2018 Medical Rec #:  [id number]              Height:       66.0 in Accession #:    [id number]             Weight:       212.0 lb Date of Birth:  18-Sep-1965              BSA:          2.05 m Patient Age:    53 years               BP:           125/84 mmHg Patient Gender: F                      HR:           82 bpm. Exam Location:  [facility]   Procedure: 2D Echo  Indications:    I47.1 (ICD-10-CM) - SVT (supraventricular tachycardia)  History:        Patient has no prior history of Echocardiogram examinations. GERD (gastroesophageal reflux disease), Rectal cancer, cT2N0.  Sonographer:    Aida Pizza RCS Referring Phys: [phone] SURESH A KONESWARAN  IMPRESSIONS   1. The left ventricle has normal systolic function with an ejection fraction of 60-65%. The cavity size was normal. Left ventricular diastolic parameters were normal. No  evidence of left ventricular regional wall motion abnormalities. 2. The right ventricle has normal systolic function. The cavity was normal. There is no increase in right ventricular wall thickness. 3. The mitral valve is grossly normal. 4. The tricuspid valve is grossly normal. 5. The aortic valve is tricuspid. Mild thickening of the aortic valve.  FINDINGS Left Ventricle: The left ventricle has normal systolic function, with an ejection fraction of 60-65%. The cavity size was normal. There is no increase in left ventricular wall thickness. Left ventricular diastolic parameters were normal. No evidence of left ventricular regional wall motion abnormalities..  Right Ventricle: The right ventricle has normal systolic function. The cavity was normal. There is no increase in right ventricular wall thickness.  Left Atrium: Left atrial size was normal in size.  Right Atrium: Right atrial size was normal in size. Right atrial pressure is estimated at 3  mmHg.  Interatrial Septum: No atrial level shunt detected by color flow Doppler.  Pericardium: There is no evidence of pericardial effusion.  Mitral Valve: The mitral valve is grossly normal. Mitral valve regurgitation is not visualized by color flow Doppler.  Tricuspid Valve: The tricuspid valve is grossly normal. Tricuspid valve regurgitation is trivial by color flow Doppler.  Aortic Valve: The aortic valve is tricuspid Mild thickening of the aortic valve. Aortic valve regurgitation was not visualized by color flow Doppler. There is no evidence of aortic valve stenosis.  Pulmonic Valve: The pulmonic valve was grossly normal. Pulmonic valve regurgitation is not visualized by color flow Doppler.  Venous: The inferior vena cava is normal in size with greater than 50% respiratory variability.   +--------------+--------++ LEFT VENTRICLE              +----------------+----------++ +--------------+--------++      Diastology                 PLAX 2D                     +----------------+----------++ +--------------+--------++      LV e' lateral:  13.60 cm/s LVIDd:        4.12 cm       +----------------+----------++ +--------------+--------++      LV E/e' lateral:5.4        LVIDs:        2.34 cm       +----------------+----------++ +--------------+--------++      LV e' medial:   10.20 cm/s LV PW:        0.74 cm       +----------------+----------++ +--------------+--------++      LV E/e' medial: 7.2        LV IVS:       0.80 cm       +----------------+----------++ +--------------+--------++ LVOT diam:    2.00 cm  +--------------+--------++ LV SV:        56 ml    +--------------+--------++ LV SV Index:  26.08    +--------------+--------++ LVOT Area:    3.14 cm +--------------+--------++                        +--------------+--------++  +------------------+---------++ LV Volumes (MOD)             +------------------+---------++ LV area d, A2C:   24.40 cm +------------------+---------++ LV area d, A4C:   29.20 cm +------------------+---------++ LV area s, A2C:   11.40 cm +------------------+---------++ LV area s, A4C:   17.00 cm +------------------+---------++ LV major d, A2C:  7.82 cm   +------------------+---------++  LV major d, A4C:  8.34 cm   +------------------+---------++ LV major s, A2C:  5.88 cm   +------------------+---------++ LV major s, A4C:  6.69 cm   +------------------+---------++ LV vol d, MOD A2C:63.8 ml   +------------------+---------++ LV vol d, MOD A4C:84.2 ml   +------------------+---------++ LV vol s, MOD A2C:19.4 ml   +------------------+---------++ LV vol s, MOD A4C:36.5 ml   +------------------+---------++ LV SV MOD A2C:    44.4 ml   +------------------+---------++ LV SV MOD A4C:    84.2 ml   +------------------+---------++ LV SV MOD BP:     47.2 ml   +------------------+---------++  +---------------+----------++ RIGHT VENTRICLE           +---------------+----------++ RV S prime:    12.40 cm/s +---------------+----------++ TAPSE (M-mode):1.9 cm     +---------------+----------++  +---------------+-------++-----------++ LEFT ATRIUM           Index       +---------------+-------++-----------++ LA diam:       3.60 cm1.76 cm/m  +---------------+-------++-----------++ LA Vol (A2C):  39.5 ml19.27 ml/m +---------------+-------++-----------++ LA Vol (A4C):  45.2 ml22.05 ml/m +---------------+-------++-----------++ LA Biplane Vol:42.4 ml20.68 ml/m +---------------+-------++-----------++ +------------+---------++-----------++ RIGHT ATRIUM         Index       +------------+---------++-----------++ RA Area:    14.60 cm            +------------+---------++-----------++ RA Volume:  36.30 ml 17.71  ml/m +------------+---------++-----------++ +------------+-----------++ AORTIC VALVE            +------------+-----------++ LVOT Vmax:  91.90 cm/s  +------------+-----------++ LVOT Vmean: 60.300 cm/s +------------+-----------++ LVOT VTI:   0.208 m     +------------+-----------++  +-------------+-------++ AORTA                +-------------+-------++ Ao Root diam:3.00 cm +-------------+-------++  +--------------+----------++ +---------------+-----------++ MITRAL VALVE             TRICUSPID VALVE            +--------------+----------++ +---------------+-----------++ MV Area (PHT):3.42 cm   TR Peak grad:  20.4 mmHg   +--------------+----------++ +---------------+-----------++ MV PHT:       64.38 msec TR Vmax:       226.00 cm/s +--------------+----------++ +---------------+-----------++ MV Decel Time:222 msec   +--------------+----------++ +--------------+-------+ +--------------+----------++ SHUNTS                MV E velocity:73.70 cm/s +--------------+-------+ +--------------+----------++ Systemic VTI: 0.21 m  MV A velocity:72.30 cm/s +--------------+-------+ +--------------+----------++ Systemic Diam:2.00 cm MV E/A ratio: 1.02       +--------------+-------+ +--------------+----------++   Pearla Rout MD Electronically signed by Pearla Rout MD Signature Date/Time: 12/26/2018/12:23:11 PM    Final          ______________________________________________________________________________________________      Risk Assessment/Calculations:              Physical Exam:     VS:  BP 129/87 (BP Location: Left Arm, Patient Position: Sitting)   Pulse 88   Ht 5' 6 (1.676 m)   Wt 196 lb (88.9 kg)   SpO2 98%   BMI 31.64 kg/m      Wt Readings from Last 3 Encounters:  04/23/24 193 lb 6.4 oz (87.7 kg)  10/19/23 187 lb (84.8 kg)  08/21/23 186 lb 11.7 oz (84.7 kg)     GEN: Well  nourished, well developed, in no acute distress NECK: No JVD; No carotid bruits CARDIAC: RRR, no murmurs, rubs, gallops RESPIRATORY:  Clear to auscultation without rales, wheezing or rhonchi  ABDOMEN: Soft, non-tender, non-distended, normal bowel sounds EXTREMITIES:  Warm and well perfused, no edema; No deformity, 2+ radial pulses PSYCH: Normal mood and affect   Assessment & Plan SVT (supraventricular tachycardia) - Patient presenting with known SVT that has become more frequent. - I counseled the patient on what SVT is and how it is managed. - After discussion of all options including potential ablation, the patient would prefer to try medicine instead. - We will start diltiazem  120 mg once a day with as needed diltiazem  for breakthrough. - Is also a bit peculiar to me that she is having more frequent SVT and the coughing associated with her SVT episode.  I will get an updated echocardiogram to make sure there are no new structural changes that could account for the symptoms. Start diltiazem  120 mg daily + diltiazem  30 mg QID prn Complete echocardiogram Follow-up in 2 months          This note was written with the assistance of a dictation microphone or AI dictation software. Please excuse any typos or grammatical errors.   Signed, Georganna Archer, MD 07/14/2024 1:45 PM    [facility]

## 2024-07-15 ENCOUNTER — Ambulatory Visit
Attending: Student in an Organized Health Care Education/Training Program | Admitting: Student in an Organized Health Care Education/Training Program

## 2024-07-15 ENCOUNTER — Encounter: Payer: Self-pay | Admitting: Student in an Organized Health Care Education/Training Program

## 2024-07-15 ENCOUNTER — Other Ambulatory Visit: Payer: Self-pay

## 2024-07-15 VITALS — BP 129/87 | HR 88 | Ht 66.0 in | Wt 196.0 lb

## 2024-07-15 DIAGNOSIS — I471 Supraventricular tachycardia, unspecified: Secondary | ICD-10-CM | POA: Diagnosis not present

## 2024-07-15 DIAGNOSIS — Z6831 Body mass index (BMI) 31.0-31.9, adult: Secondary | ICD-10-CM

## 2024-07-15 MED ORDER — WEGOVY 2.4 MG/0.75ML ~~LOC~~ SOAJ: 2.4000 mg | SUBCUTANEOUS | 2 refills | Status: DC

## 2024-07-15 MED ORDER — DILTIAZEM HCL ER COATED BEADS 120 MG PO CP24: 120.0000 mg | ORAL_CAPSULE | Freq: Every day | ORAL | 3 refills | Status: AC

## 2024-07-15 MED ORDER — DILTIAZEM HCL 30 MG PO TABS: 30.0000 mg | ORAL_TABLET | Freq: Four times a day (QID) | ORAL | 1 refills | Status: DC | PRN

## 2024-07-15 NOTE — Patient Instructions (Addendum)
 Medication Instructions:  1.Start diltiazem  120 mg daily 2.Start diltiazem  30 mg, can take up to 4 times daily as needed for SVT *If you need a refill on your cardiac medications before your next appointment, please call your pharmacy*  Lab Work: None ordered If you have labs (blood work) drawn today and your tests are completely normal, you will receive your results only by: MyChart Message (if you have MyChart) OR A paper copy in the mail If you have any lab test that is abnormal or we need to change your treatment, we will call you to review the results.  Testing/Procedures: Your physician has requested that you have an echocardiogram. Echocardiography is a painless test that uses sound waves to create images of your heart. It provides your doctor with information about the size and shape of your heart and how well your heart's chambers and valves are working. This procedure takes approximately one hour. There are no restrictions for this procedure. Please do NOT wear cologne, perfume, aftershave, or lotions (deodorant is allowed). Please arrive 15 minutes prior to your appointment time.  Please note: We ask at that you not bring children with you during ultrasound (echo/ vascular) testing. Due to room size and safety concerns, children are not allowed in the ultrasound rooms during exams. Our front office staff cannot provide observation of children in our lobby area while testing is being conducted. An adult accompanying a patient to their appointment will only be allowed in the ultrasound room at the discretion of the ultrasound technician under special circumstances. We apologize for any inconvenience.   Follow-Up: At [facility], you and your health needs are our priority.  As part of our continuing mission to provide you with exceptional heart care, our providers are all part of one team.  This team includes your primary Cardiologist (physician) and Advanced Practice Providers  or APPs (Physician Assistants and Nurse Practitioners) who all work together to provide you with the care you need, when you need it.  Your next appointment:   2 month(s)  Provider:   Dr Floretta  IF YOU HAVE AN EPISODE WHEN YOUR HEART IS BEATING FAST - SIT DOWN AND PUT YOUR FEET UP  Recommendations for vagal maneuvers that you can try to slow your fast heart rate include: Bearing down -- Bearing down means that you try to breathe out with your stomach muscles but you don't let air out of your nose or mouth. Coughing or gagging Icy, cold towel on face or drink ice cold water 

## 2024-07-15 NOTE — Assessment & Plan Note (Signed)
-   Patient presenting with known SVT that has become more frequent. - I counseled the patient on what SVT is and how it is managed. - After discussion of all options including potential ablation, the patient would prefer to try medicine instead. - We will start diltiazem  120 mg once a day with as needed diltiazem  for breakthrough. - Is also a bit peculiar to me that she is having more frequent SVT and the coughing associated with her SVT episode.  I will get an updated echocardiogram to make sure there are no new structural changes that could account for the symptoms. Start diltiazem  120 mg daily + diltiazem  30 mg QID prn Complete echocardiogram Follow-up in 2 months

## 2024-07-18 ENCOUNTER — Telehealth: Payer: Self-pay | Admitting: Nurse Practitioner

## 2024-07-18 NOTE — Telephone Encounter (Unsigned)
 Copied from CRM #[id number]. Topic: Clinical - Medication Prior Auth >> Jul 18, 2024  4:10 PM Delon T wrote: Reason for CRM: semaglutide -weight management (WEGOVY ) 2.4 MG/0.75ML SOAJ SQ injection- insurance needs PA for refill- patient has been without it for 2 weeks

## 2024-07-19 ENCOUNTER — Telehealth: Payer: Self-pay | Admitting: Pharmacy Technician

## 2024-07-19 ENCOUNTER — Other Ambulatory Visit (HOSPITAL_COMMUNITY): Payer: Self-pay

## 2024-07-19 NOTE — Telephone Encounter (Signed)
 Pharmacy Patient Advocate Encounter  Received notification from [facility] that Prior Authorization for Wegovy  2.4MG /0.75ML auto-injectors has been APPROVED from 07/19/24 to 07/19/25. Ran test claim, Copay is $0.00. This test claim was processed through [facility]- copay amounts may vary at other pharmacies due to pharmacy/plan contracts, or as the patient moves through the different stages of their insurance plan.   PA #/Case ID/Reference #: [id number]

## 2024-07-19 NOTE — Telephone Encounter (Signed)
 PA request has been Started. New Encounter has been or will be created for follow up. For additional info see Pharmacy Prior Auth telephone encounter from 07/19/24.

## 2024-07-19 NOTE — Telephone Encounter (Signed)
 Pharmacy Patient Advocate Encounter   Received notification from Pt Calls Messages that prior authorization for Wegovy  2.4MG /0.75ML auto-injectors  is required/requested.   Insurance verification completed.   The patient is insured through [facility].   Per test claim: PA required; PA started via CoverMyMeds. KEY [id number]  . Waiting for clinical questions to populate.

## 2024-07-19 NOTE — Telephone Encounter (Signed)
 Left detailed message per signed DPR. Encouraged call back or send us  a Mychart message if there are any questions.

## 2024-08-08 ENCOUNTER — Other Ambulatory Visit: Payer: Self-pay | Admitting: Student in an Organized Health Care Education/Training Program

## 2024-08-16 ENCOUNTER — Encounter: Payer: Self-pay | Admitting: Orthopedic Surgery

## 2024-08-16 ENCOUNTER — Ambulatory Visit: Admitting: Orthopedic Surgery

## 2024-08-16 ENCOUNTER — Other Ambulatory Visit (INDEPENDENT_AMBULATORY_CARE_PROVIDER_SITE_OTHER): Payer: Self-pay

## 2024-08-16 DIAGNOSIS — Z96612 Presence of left artificial shoulder joint: Secondary | ICD-10-CM

## 2024-08-16 NOTE — Progress Notes (Signed)
 Orthopaedic Postop Note  Assessment: [name] is a 59 y.o. female s/p Left Anatomic Total Shoulder Arthroplasty  DOS: 08/28/2023  Plan: Mrs Graul is doing very well following surgery.  She has near full range of motion and good strength.  She is happy with her results.  I think she is doing very well.  Radiographs remain stable.  She is not having any issues.  She will follow-up as needed.   Follow-up: Return if symptoms worsen or fail to improve.  XR at next visit: Left shoulder  Subjective:  Chief Complaint  Patient presents with   Routine Post Op    L RSA DOS: 08/28/2023    History of Present Illness: [name] is a 59 y.o. female who presents following the above stated procedure.  Surgery was approximately 1 year ago.  Overall, she is doing very well.  She has occasional aches, but does not take medicines on a regular basis.  She has excellent function.  She is pleased with her results.  No complaints at this time.   Review of Systems: No fevers or chills No numbness or tingling No Chest Pain No shortness of breath   Objective: There were no vitals taken for this visit.  Physical Exam:  Alert and oriented, no acute distress  Left shoulder surgical incision is healed.  No surrounding erythema or drainage.  Sensation is intact throughout the left upper extremity.  170 degrees of forward flexion.  100 degrees of abduction.  Internal rotation to T12, but this is relatively slow.  5/5 deltoid, supraspinatus and infraspinatus strength.  Negative belly press.   IMAGING: I personally ordered and reviewed the following images:  X-rays of the left shoulder were obtained in clinic today.  These are compared to prior x-rays.  Prosthesis remains in stable alignment.  There is no lucency around the prosthesis.  No acute fractures.  No subsidence.  No change in overall alignment.  No bony lesions.  Impression: Stable left anatomic total shoulder  arthroplasty   [name], MD 08/16/2024 9:16 AM

## 2024-08-22 ENCOUNTER — Other Ambulatory Visit: Payer: Self-pay | Admitting: Orthopedic Surgery

## 2024-08-23 ENCOUNTER — Ambulatory Visit (HOSPITAL_COMMUNITY)
Admission: RE | Admit: 2024-08-23 | Discharge: 2024-08-23 | Disposition: A | Source: Ambulatory Visit | Attending: Student in an Organized Health Care Education/Training Program

## 2024-08-23 ENCOUNTER — Ambulatory Visit: Payer: Self-pay | Admitting: Student in an Organized Health Care Education/Training Program

## 2024-08-23 DIAGNOSIS — I471 Supraventricular tachycardia, unspecified: Secondary | ICD-10-CM | POA: Diagnosis present

## 2024-08-23 LAB — ECHOCARDIOGRAM COMPLETE
Area-P 1/2: 3.19 cm2
S' Lateral: 2.3 cm

## 2024-08-25 DIAGNOSIS — Z6831 Body mass index (BMI) 31.0-31.9, adult: Secondary | ICD-10-CM

## 2024-08-26 MED ORDER — WEGOVY 2.4 MG/0.75ML ~~LOC~~ SOAJ: 2.4000 mg | SUBCUTANEOUS | 2 refills | Status: AC

## 2024-09-24 ENCOUNTER — Ambulatory Visit: Admitting: Student in an Organized Health Care Education/Training Program

## 2024-10-28 ENCOUNTER — Ambulatory Visit: Payer: Self-pay | Admitting: Nurse Practitioner
